# Patient Record
Sex: Male | Born: 1950
Health system: Southern US, Community
[De-identification: ages and names within clinical notes are randomized; demographics above are authoritative.]

## PROBLEM LIST (undated history)

## (undated) DIAGNOSIS — K635 Polyp of colon: Secondary | ICD-10-CM

## (undated) DIAGNOSIS — K2270 Barrett's esophagus without dysplasia: Secondary | ICD-10-CM

## (undated) DIAGNOSIS — C7A098 Malignant carcinoid tumors of other sites: Secondary | ICD-10-CM

## (undated) DIAGNOSIS — J3089 Other allergic rhinitis: Secondary | ICD-10-CM

## (undated) DIAGNOSIS — D3A021 Benign carcinoid tumor of the cecum: Secondary | ICD-10-CM

## (undated) DIAGNOSIS — R011 Cardiac murmur, unspecified: Secondary | ICD-10-CM

## (undated) DIAGNOSIS — Z978 Presence of other specified devices: Secondary | ICD-10-CM

## (undated) DIAGNOSIS — T7840XA Allergy, unspecified, initial encounter: Secondary | ICD-10-CM

## (undated) DIAGNOSIS — I10 Essential (primary) hypertension: Secondary | ICD-10-CM

## (undated) DIAGNOSIS — K219 Gastro-esophageal reflux disease without esophagitis: Secondary | ICD-10-CM

## (undated) DIAGNOSIS — K589 Irritable bowel syndrome without diarrhea: Secondary | ICD-10-CM

## (undated) DIAGNOSIS — N400 Enlarged prostate without lower urinary tract symptoms: Secondary | ICD-10-CM

## (undated) HISTORY — DX: Other allergic rhinitis: J30.89

## (undated) HISTORY — PX: UPPER GASTROINTESTINAL ENDOSCOPY: SHX188

## (undated) HISTORY — DX: Polyp of colon: K63.5

## (undated) HISTORY — DX: Irritable bowel syndrome, unspecified: K58.9

## (undated) HISTORY — DX: Benign carcinoid tumor of the cecum: D3A.021

## (undated) HISTORY — DX: Malignant carcinoid tumors of other sites: C7A.098

## (undated) HISTORY — DX: Allergy, unspecified, initial encounter: T78.40XA

## (undated) HISTORY — DX: Barrett's esophagus without dysplasia: K22.70

## (undated) HISTORY — DX: Essential (primary) hypertension: I10

## (undated) HISTORY — PX: COLON SURGERY: SHX602

## (undated) HISTORY — DX: Cardiac murmur, unspecified: R01.1

## (undated) HISTORY — DX: Gastro-esophageal reflux disease without esophagitis: K21.9

---

## 2004-11-04 ENCOUNTER — Ambulatory Visit: Payer: Self-pay | Admitting: Gastroenterology

## 2004-11-11 ENCOUNTER — Ambulatory Visit: Payer: Self-pay | Admitting: Gastroenterology

## 2004-11-26 ENCOUNTER — Ambulatory Visit: Payer: Self-pay | Admitting: Gastroenterology

## 2004-11-26 ENCOUNTER — Ambulatory Visit (HOSPITAL_COMMUNITY): Admission: RE | Admit: 2004-11-26 | Discharge: 2004-11-26 | Payer: Self-pay | Admitting: Internal Medicine

## 2005-01-29 ENCOUNTER — Ambulatory Visit: Payer: Self-pay | Admitting: Gastroenterology

## 2005-02-09 ENCOUNTER — Ambulatory Visit: Payer: Self-pay | Admitting: Gastroenterology

## 2007-12-12 ENCOUNTER — Ambulatory Visit: Payer: Self-pay | Admitting: Gastroenterology

## 2007-12-30 ENCOUNTER — Ambulatory Visit: Payer: Self-pay | Admitting: Gastroenterology

## 2007-12-30 ENCOUNTER — Encounter: Payer: Self-pay | Admitting: Gastroenterology

## 2008-07-29 ENCOUNTER — Emergency Department (HOSPITAL_COMMUNITY): Admission: EM | Admit: 2008-07-29 | Discharge: 2008-07-29 | Payer: Self-pay | Admitting: Emergency Medicine

## 2008-08-28 ENCOUNTER — Ambulatory Visit (HOSPITAL_COMMUNITY): Admission: RE | Admit: 2008-08-28 | Discharge: 2008-08-28 | Payer: Self-pay | Admitting: Family Medicine

## 2008-12-03 ENCOUNTER — Ambulatory Visit (HOSPITAL_COMMUNITY): Admission: RE | Admit: 2008-12-03 | Discharge: 2008-12-03 | Payer: Self-pay | Admitting: Urology

## 2008-12-21 DIAGNOSIS — T8859XA Other complications of anesthesia, initial encounter: Secondary | ICD-10-CM

## 2008-12-21 HISTORY — DX: Other complications of anesthesia, initial encounter: T88.59XA

## 2010-12-31 ENCOUNTER — Encounter: Payer: Self-pay | Admitting: Gastroenterology

## 2011-01-22 NOTE — Letter (Signed)
Summary: Colonoscopy Date Change Letter  [facility]  [address] [place], [place] [address]   Phone: [phone]  Fax: [phone]      December 31, 2010 MRN: [id number]   Tommy Conley [address] [place], [place]  [address]   Dear [name],   Previously you were recommended to have a repeat colonoscopy around this time. Your chart was recently reviewed by Dr. Claudette Head of [facility]. Follow up colonoscopy is now recommended in June 2012. This revised recommendation is based on current, nationally recognized guidelines for colorectal cancer screening and polyp surveillance. These guidelines are endorsed by the American Cancer Society, [employer] on Colorectal Cancer as well as numerous other major medical organizations.  Please understand that our recommendation assumes that you do not have any new symptoms such as bleeding, a change in bowel habits, anemia, or significant abdominal discomfort. If you do have any concerning GI symptoms or want to discuss the guideline recommendations, please call to arrange an office visit at your earliest convenience. Otherwise we will keep you in our reminder system and contact you 1-2 months prior to the date listed above to schedule your next colonoscopy.  Thank you,  Judie Petit T. Russella Dar, M.D.  [facility] [phone]

## 2011-05-05 NOTE — Assessment & Plan Note (Signed)
[facility]                         GASTROENTEROLOGY OFFICE NOTE   Tommy Conley, Tommy Conley                   MRN:          [id number]  DATE:12/12/2007                            DOB:          09-Aug-1951    This is a return office visit for GERD.  He relates intermittent  problems with nighttime reflux and regurgitation despite maintaining  Nexium on a daily basis with the p.r.n. use of Zantac.  In addition, he  has a history of a short segment of Barrett's esophagus from endoscopy  in February 2006.   CURRENT MEDICATIONS:  Listed on the chart, have been reviewed.   MEDICATION ALLERGIES:  None known.   In no acute distress.  Blood pressure 132/80, pulse 100 and regular.  CHEST:  Clear to auscultation bilaterally.  CARDIAC:  Regular rate and rhythm without murmurs.  ABDOMEN:  Soft and nontender with normoactive bowel sounds.   ASSESSMENT AND PLAN:  Gastroesophageal reflux disease with a history of  focal short-segment Barrett's esophagus.  He is to reintensify all  standard antireflux measures including the use of 4-inch bed blocks and  no oral intake for 3-4 hours before bedtime.  We may need to increase  his proton pump inhibitor to b.i.d.  Risks, benefits, and alternatives  to upper endoscopy with possible biopsy discussed with the patient and  he consents to proceed.  This will be scheduled electively.     Venita Lick. Russella Dar, MD, [facility]  Electronically Signed    MTS/MedQ  DD: 12/22/2007  DT: 12/22/2007  Job #: [id number]   cc:   Donna Bernard, M.D.

## 2011-05-27 ENCOUNTER — Encounter: Payer: Self-pay | Admitting: Gastroenterology

## 2011-06-19 ENCOUNTER — Encounter: Payer: Self-pay | Admitting: Gastroenterology

## 2011-07-22 HISTORY — PX: COLONOSCOPY: SHX174

## 2011-08-07 ENCOUNTER — Ambulatory Visit (AMBULATORY_SURGERY_CENTER): Payer: 59 | Admitting: *Deleted

## 2011-08-07 ENCOUNTER — Encounter: Payer: Self-pay | Admitting: Gastroenterology

## 2011-08-07 VITALS — Ht 73.0 in | Wt 225.6 lb

## 2011-08-07 DIAGNOSIS — K2270 Barrett's esophagus without dysplasia: Secondary | ICD-10-CM

## 2011-08-07 DIAGNOSIS — Z1211 Encounter for screening for malignant neoplasm of colon: Secondary | ICD-10-CM

## 2011-08-07 MED ORDER — PEG-KCL-NACL-NASULF-NA ASC-C 100 G PO SOLR: ORAL | Status: DC

## 2011-08-21 ENCOUNTER — Encounter: Payer: Self-pay | Admitting: Gastroenterology

## 2011-08-21 ENCOUNTER — Other Ambulatory Visit: Payer: Self-pay | Admitting: Gastroenterology

## 2011-08-21 ENCOUNTER — Encounter: Payer: 59 | Admitting: Gastroenterology

## 2011-08-21 ENCOUNTER — Telehealth: Payer: Self-pay

## 2011-08-21 ENCOUNTER — Ambulatory Visit (AMBULATORY_SURGERY_CENTER): Payer: 59 | Admitting: Gastroenterology

## 2011-08-21 DIAGNOSIS — K6389 Other specified diseases of intestine: Secondary | ICD-10-CM

## 2011-08-21 DIAGNOSIS — K2270 Barrett's esophagus without dysplasia: Secondary | ICD-10-CM

## 2011-08-21 DIAGNOSIS — R933 Abnormal findings on diagnostic imaging of other parts of digestive tract: Secondary | ICD-10-CM

## 2011-08-21 DIAGNOSIS — D131 Benign neoplasm of stomach: Secondary | ICD-10-CM

## 2011-08-21 DIAGNOSIS — K210 Gastro-esophageal reflux disease with esophagitis: Secondary | ICD-10-CM

## 2011-08-21 DIAGNOSIS — D126 Benign neoplasm of colon, unspecified: Secondary | ICD-10-CM

## 2011-08-21 DIAGNOSIS — Z1211 Encounter for screening for malignant neoplasm of colon: Secondary | ICD-10-CM

## 2011-08-21 DIAGNOSIS — D3A00 Benign carcinoid tumor of unspecified site: Secondary | ICD-10-CM

## 2011-08-21 MED ORDER — SODIUM CHLORIDE 0.9 % IV SOLN: 500.0000 mL | INTRAVENOUS | Status: DC

## 2011-08-21 NOTE — Patient Instructions (Signed)
Please refer to blue and green discharge instruction sheets. 

## 2011-08-21 NOTE — Telephone Encounter (Signed)
Pt scheduled for CT of abd/pelvis 08/25/11 @ [facility]. Pt to arrive at 1:45pm, test @2pm . NPO after 10am. Pt to drink one bottle of contrast at 12noon and 1 bottle at 1pm. Pt aware of appt date and time. Contrast was sent home with the pt by endo.

## 2011-08-21 NOTE — Progress Notes (Signed)
Moved to recliner room as MD wants to talk to pt a second time.

## 2011-08-25 ENCOUNTER — Telehealth: Payer: Self-pay | Admitting: *Deleted

## 2011-08-25 ENCOUNTER — Telehealth: Payer: Self-pay

## 2011-08-25 ENCOUNTER — Ambulatory Visit (INDEPENDENT_AMBULATORY_CARE_PROVIDER_SITE_OTHER)
Admission: RE | Admit: 2011-08-25 | Discharge: 2011-08-25 | Disposition: A | Discharge status: Home / Self Care | Payer: 59 | Source: Ambulatory Visit | Attending: Gastroenterology | Admitting: Gastroenterology

## 2011-08-25 ENCOUNTER — Other Ambulatory Visit: Payer: Self-pay

## 2011-08-25 DIAGNOSIS — K6389 Other specified diseases of intestine: Secondary | ICD-10-CM

## 2011-08-25 MED ORDER — IOHEXOL 300 MG/ML  SOLN
100.0000 mL | Freq: Once | INTRAMUSCULAR | Status: AC | PRN
  Administered 2011-08-25: 100 mL via INTRAVENOUS

## 2011-08-25 NOTE — Telephone Encounter (Signed)

## 2011-08-25 NOTE — Telephone Encounter (Signed)
Pt scheduled to see Dr. Derrell Lolling 08/27/11@2pm . Pt aware of appt date and time.

## 2011-08-26 ENCOUNTER — Other Ambulatory Visit (INDEPENDENT_AMBULATORY_CARE_PROVIDER_SITE_OTHER): Payer: 59

## 2011-08-26 DIAGNOSIS — R933 Abnormal findings on diagnostic imaging of other parts of digestive tract: Secondary | ICD-10-CM

## 2011-08-26 DIAGNOSIS — K6389 Other specified diseases of intestine: Secondary | ICD-10-CM

## 2011-08-26 LAB — CBC WITH DIFFERENTIAL/PLATELET
Basophils Absolute: 0 10*3/uL (ref 0.0–0.1)
Basophils Relative: 0.3 % (ref 0.0–3.0)
Eosinophils Absolute: 0.1 10*3/uL (ref 0.0–0.7)
Hemoglobin: 12 g/dL — ABNORMAL LOW (ref 13.0–17.0)
Lymphocytes Relative: 13.8 % (ref 12.0–46.0)
MCHC: 32.1 g/dL (ref 30.0–36.0)
MCV: 75.8 fl — ABNORMAL LOW (ref 78.0–100.0)
Monocytes Absolute: 0.5 10*3/uL (ref 0.1–1.0)
Neutro Abs: 6.2 10*3/uL (ref 1.4–7.7)
Neutrophils Relative %: 78.4 % — ABNORMAL HIGH (ref 43.0–77.0)
Platelets: 247 10*3/uL (ref 150.0–400.0)
RBC: 4.93 Mil/uL (ref 4.22–5.81)
RDW: 18.2 % — ABNORMAL HIGH (ref 11.5–14.6)
WBC: 7.9 10*3/uL (ref 4.5–10.5)

## 2011-08-26 LAB — COMPREHENSIVE METABOLIC PANEL
ALT: 12 U/L (ref 0–53)
AST: 17 U/L (ref 0–37)
Albumin: 4 g/dL (ref 3.5–5.2)
Alkaline Phosphatase: 62 U/L (ref 39–117)
CO2: 27 mEq/L (ref 19–32)
Calcium: 9.1 mg/dL (ref 8.4–10.5)
Chloride: 106 mEq/L (ref 96–112)
Creatinine, Ser: 0.9 mg/dL (ref 0.4–1.5)
GFR: 109.18 mL/min (ref >60.00)
Glucose, Bld: 94 mg/dL (ref 70–99)
Potassium: 3.5 mEq/L (ref 3.5–5.1)
Sodium: 141 mEq/L (ref 135–145)
Total Bilirubin: 0.6 mg/dL (ref 0.3–1.2)
Total Protein: 7.1 g/dL (ref 6.0–8.3)

## 2011-08-27 ENCOUNTER — Ambulatory Visit (INDEPENDENT_AMBULATORY_CARE_PROVIDER_SITE_OTHER): Payer: Self-pay | Admitting: General Surgery

## 2011-08-27 LAB — CEA: CEA: 2.2 ng/mL (ref 0.0–5.0)

## 2011-08-31 ENCOUNTER — Encounter: Payer: Self-pay | Admitting: Gastroenterology

## 2011-08-31 ENCOUNTER — Telehealth: Payer: Self-pay | Admitting: Gastroenterology

## 2011-08-31 NOTE — Telephone Encounter (Signed)
I called patient to review his pathology report, CT and blood work. I answered his questions. He notes looser stools for about 6 months but denies other symptoms. He will see Dr. Derrell Lolling on Thursday. I discussed the information to date with Dr. Derrell Lolling earlier today.

## 2011-09-03 ENCOUNTER — Ambulatory Visit (INDEPENDENT_AMBULATORY_CARE_PROVIDER_SITE_OTHER): Payer: 59 | Admitting: General Surgery

## 2011-09-03 ENCOUNTER — Encounter (INDEPENDENT_AMBULATORY_CARE_PROVIDER_SITE_OTHER): Payer: Self-pay | Admitting: General Surgery

## 2011-09-03 VITALS — BP 132/88 | HR 84 | Temp 98.5°F | Ht 73.0 in | Wt 217.8 lb

## 2011-09-03 DIAGNOSIS — C787 Secondary malignant neoplasm of liver and intrahepatic bile duct: Secondary | ICD-10-CM

## 2011-09-03 DIAGNOSIS — C7B02 Secondary carcinoid tumors of liver: Secondary | ICD-10-CM

## 2011-09-03 DIAGNOSIS — C7A00 Malignant carcinoid tumor of unspecified site: Secondary | ICD-10-CM

## 2011-09-03 DIAGNOSIS — C7A022 Malignant carcinoid tumor of the ascending colon: Secondary | ICD-10-CM

## 2011-09-03 NOTE — Patient Instructions (Signed)
You have a malignant carcinoid tumor of the right colon. It has possibly spread to the liver. You will be scheduled for blood work and a 24-hour urine collection. You will be scheduled for surgery to remove the tumor from the right colon and biopsy of the liver. After you have recovered from the surgery you will be referred to a medical oncologist.

## 2011-09-03 NOTE — Progress Notes (Addendum)
Chief Complaint  Patient presents with  . 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 male.   This is a 60 year old [place] American man from [place], [place]. He is referred to me by. Dr. 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 Dr. Loran Senters is his primary care physician.  The patient is healthy. He is treated for gastroesophageal reflux disease and hypertension, but otherwise has no medical problems and has never had any surgery.  For the past 6 or 8 months his stools have been looser. He will have 4-5 semi-formed loose stools a day, and the consistency will depend on what eats. He denies any abdominal pain or weight loss. He denies any flushing or diaphoresis or palpitations or tachycardia.  A screening colonoscopy was performed on August 21, 2011 and Dr. Russella Dar describes it as a 4 cm mass in the cecum which was friable and malignant looking. There were some polyps in the rectum. There was a polyp in the stomach which he found on endoscopy.  Pathology report shows a well-differentiated neuroendocrine tumor, or carcinoid of the cecum. The polyps from the descending colon and rectum showed benign tubular adenomas. Biopsy of the gastric body shows a benign fundic gland polyp. Negative for Helicobacter pylori.  A CT scan was subsequently performed and this shows a mass in the cecum with mild inflammatory changes. There are multiple bulky liver metastases. He has an enlarged prostate. A CEA is 2.2.  I called Dr. Mancel Bale at the cancer Center today we discussed strategies for his care.  HPI  Past Medical History  Diagnosis Date  . Hypertension   . Barrett's esophagus   . GERD (gastroesophageal reflux disease)   . Colon polyp     Past Surgical History  Procedure Date  . Colonoscopy 07/2011  . Upper gastrointestinal endoscopy     hx barrett's esophagus     Family History  Problem Relation Age of Onset  . Colon polyps Neg Hx   . Colon cancer Neg Hx   . Stomach cancer Neg Hx     Social History History  Substance Use Topics  . Smoking status: Current Everyday Smoker -- 0.2 packs/day  . Smokeless tobacco: Not on file  . Alcohol Use: No    No Known Allergies  Current Outpatient Prescriptions  Medication Sig Dispense Refill  . amLODipine (NORVASC) 10 MG tablet Take 10 mg by mouth daily.       . hyoscyamine (LEVBID) 0.375 MG 12 hr tablet every 12 (twelve) hours as needed.       [name] losartan (COZAAR) 50 MG tablet Take 50 mg by mouth daily.       [name] NEXIUM 40 MG capsule Take 40 mg by mouth daily before breakfast.         Review of Systems Review of Systems  Constitutional: Negative.   HENT: Negative.   Eyes: Negative.   Respiratory: Negative.   Cardiovascular: Negative.   Gastrointestinal: Positive for diarrhea. Negative for nausea, vomiting, abdominal pain, constipation, blood in stool, abdominal distention, anal bleeding and rectal pain.  Genitourinary: Negative.   Musculoskeletal: Negative.   Skin: Negative.   Neurological: Negative.   Hematological: Negative.   Psychiatric/Behavioral: Negative.     Blood pressure 132/88, pulse 84, temperature 98.5 F (36.9 C), temperature source Temporal, height 6\' 1"  (1.854 m), weight 217 lb 12.8 oz (  98.793 kg).  Physical Exam Physical Exam  Constitutional: He is oriented to person, place, and time. He appears well-developed and well-nourished. No distress.       Pleasant gentleman. His wife is with him throughout the encounter.  HENT:  Head: Atraumatic.  Nose: Nose normal.  Mouth/Throat: No oropharyngeal exudate.  Eyes: Conjunctivae and EOM are normal. Left eye exhibits no discharge. No scleral icterus.  Neck: Normal range of motion. Neck supple. No JVD present. No tracheal deviation present. No thyromegaly present.  Cardiovascular: Normal rate, normal heart sounds and intact  distal pulses.   No murmur heard. Pulmonary/Chest: Effort normal and breath sounds normal. No respiratory distress. He has no wheezes. He has no rales. He exhibits no tenderness.  Abdominal: Soft. Bowel sounds are normal. He exhibits no distension and no mass. There is no tenderness. There is no rebound and no guarding.  Musculoskeletal: Normal range of motion. He exhibits no edema and no tenderness.  Lymphadenopathy:    He has no cervical adenopathy.  Neurological: He is alert and oriented to person, place, and time. He exhibits normal muscle tone. Coordination normal.  Skin: Skin is warm and dry. No rash noted. No erythema. No pallor.  Psychiatric: He has a normal mood and affect. His behavior is normal. Judgment and thought content normal.    Data Reviewed I have reviewed his colonoscopy report. I reviewed his pathology and discussed at this Dr. Dewitt Rota in the pathology department. I have discussed his findings and care with Dr. Claudette Head I discussed strategy for his care with Dr. Mancel Bale.  Assessment    Malignant carcinoid tumor of the cecum.  Suspect multiple bilobar liver metastasis.  He has some diarrhea, but otherwise no symptoms of classic carcinoid syndrome.  Hypertension  Gastroesophageal reflux disease.    Plan    He will be scheduled for a laparoscopic-assisted right colectomy and liver biopsy.  Preoperatively we will schedule him for a chromogranin A blood level and a 24-hour urine for 5 HIAA.  I have discussed the indications and details of surgery with the patient and his wife. Risks and complications have been outlined, including but not limited to bleeding, infection, temporary ostomy, conversion to open laparotomy, injury to adjacent organs such  as the bladder or ureter,  the intestine or vascular structures with major reconstructive surgery, wound problems, cardiac pulmonary and thromboembolic problems. He seems to understand these issues well. At  this time all his questions are answered. He is in full agreement with this plan.  We will use the entereg protocol in the perioperative period to reduce the chance of ileus.  ADDENDUM (09-29-2011)---Chromagranin A level is 238 - High - (reference range 1.9-15.0)                                                Urine HIAA is 124 ; volume 1150 cc - High - (reference range less than 6.0)        Kahliyah Dick M 09/03/2011, 4:48 PM

## 2011-09-07 ENCOUNTER — Other Ambulatory Visit (INDEPENDENT_AMBULATORY_CARE_PROVIDER_SITE_OTHER): Payer: Self-pay | Admitting: General Surgery

## 2011-09-14 LAB — 5 HIAA, QUANTITATIVE, URINE, 24 HOUR: 5-HIAA, 24 Hr Urine: 124 mg/24 h — ABNORMAL HIGH (ref <=6.0)

## 2011-09-23 ENCOUNTER — Encounter (HOSPITAL_COMMUNITY)
Admission: RE | Admit: 2011-09-23 | Discharge: 2011-09-23 | Disposition: A | Discharge status: Home / Self Care | Payer: 59 | Source: Ambulatory Visit | Attending: General Surgery | Admitting: General Surgery

## 2011-09-23 ENCOUNTER — Other Ambulatory Visit (INDEPENDENT_AMBULATORY_CARE_PROVIDER_SITE_OTHER): Payer: Self-pay | Admitting: General Surgery

## 2011-09-23 DIAGNOSIS — K6389 Other specified diseases of intestine: Secondary | ICD-10-CM

## 2011-09-23 LAB — CBC
HCT: 36.5 % — ABNORMAL LOW (ref 39.0–52.0)
MCHC: 32.9 g/dL (ref 30.0–36.0)
Platelets: 228 10*3/uL (ref 150–400)
RDW: 16.7 % — ABNORMAL HIGH (ref 11.5–15.5)
WBC: 7.9 10*3/uL (ref 4.0–10.5)

## 2011-09-23 LAB — COMPREHENSIVE METABOLIC PANEL
ALT: 13 U/L (ref 0–53)
AST: 15 U/L (ref 0–37)
Albumin: 3.8 g/dL (ref 3.5–5.2)
Alkaline Phosphatase: 71 U/L (ref 39–117)
BUN: 9 mg/dL (ref 6–23)
Chloride: 103 mEq/L (ref 96–112)
Potassium: 4.2 mEq/L (ref 3.5–5.1)
Sodium: 141 mEq/L (ref 135–145)
Total Bilirubin: 0.4 mg/dL (ref 0.3–1.2)
Total Protein: 6.7 g/dL (ref 6.0–8.3)

## 2011-09-23 LAB — URINALYSIS, ROUTINE W REFLEX MICROSCOPIC
Glucose, UA: NEGATIVE mg/dL
Hgb urine dipstick: NEGATIVE
Ketones, ur: NEGATIVE mg/dL
Leukocytes, UA: NEGATIVE
Protein, ur: NEGATIVE mg/dL
pH: 6 (ref 5.0–8.0)

## 2011-09-23 LAB — DIFFERENTIAL
Basophils Absolute: 0 10*3/uL (ref 0.0–0.1)
Basophils Relative: 0 % (ref 0–1)
Eosinophils Absolute: 0.2 10*3/uL (ref 0.0–0.7)
Eosinophils Relative: 2 % (ref 0–5)
Lymphocytes Relative: 18 % (ref 12–46)
Monocytes Absolute: 0.8 10*3/uL (ref 0.1–1.0)

## 2011-09-23 LAB — ABO/RH: ABO/RH(D): B POS

## 2011-09-23 LAB — TYPE AND SCREEN: Antibody Screen: NEGATIVE

## 2011-09-23 LAB — SURGICAL PCR SCREEN: Staphylococcus aureus: NEGATIVE

## 2011-09-24 ENCOUNTER — Encounter: Payer: 59 | Admitting: Oncology

## 2011-09-25 ENCOUNTER — Telehealth (INDEPENDENT_AMBULATORY_CARE_PROVIDER_SITE_OTHER): Payer: Self-pay

## 2011-09-25 ENCOUNTER — Encounter (INDEPENDENT_AMBULATORY_CARE_PROVIDER_SITE_OTHER): Payer: Self-pay

## 2011-09-25 NOTE — Telephone Encounter (Signed)
Pt's employer contacted our office to request a letter stating the date of his surgery and the projected time out of work.  I faxed a letter to Caleb Popp, RN, at [phone].

## 2011-09-29 ENCOUNTER — Other Ambulatory Visit (INDEPENDENT_AMBULATORY_CARE_PROVIDER_SITE_OTHER): Payer: Self-pay | Admitting: General Surgery

## 2011-09-29 ENCOUNTER — Inpatient Hospital Stay (HOSPITAL_COMMUNITY)
Admission: RE | Admit: 2011-09-29 | Discharge: 2011-10-04 | DRG: 330 | Disposition: A | Discharge status: Home / Self Care | Payer: 59 | Source: Ambulatory Visit | Attending: General Surgery | Admitting: General Surgery

## 2011-09-29 DIAGNOSIS — C787 Secondary malignant neoplasm of liver and intrahepatic bile duct: Secondary | ICD-10-CM | POA: Diagnosis present

## 2011-09-29 DIAGNOSIS — Z5331 Laparoscopic surgical procedure converted to open procedure: Secondary | ICD-10-CM

## 2011-09-29 DIAGNOSIS — C7A021 Malignant carcinoid tumor of the cecum: Principal | ICD-10-CM | POA: Diagnosis present

## 2011-09-29 DIAGNOSIS — Z01818 Encounter for other preprocedural examination: Secondary | ICD-10-CM

## 2011-09-29 DIAGNOSIS — C189 Malignant neoplasm of colon, unspecified: Secondary | ICD-10-CM

## 2011-09-29 DIAGNOSIS — I10 Essential (primary) hypertension: Secondary | ICD-10-CM | POA: Diagnosis present

## 2011-09-29 DIAGNOSIS — Z79899 Other long term (current) drug therapy: Secondary | ICD-10-CM

## 2011-09-29 DIAGNOSIS — Z01810 Encounter for preprocedural cardiovascular examination: Secondary | ICD-10-CM

## 2011-09-29 DIAGNOSIS — Z01812 Encounter for preprocedural laboratory examination: Secondary | ICD-10-CM

## 2011-09-29 DIAGNOSIS — R000 Tachycardia, unspecified: Secondary | ICD-10-CM | POA: Diagnosis not present

## 2011-09-29 DIAGNOSIS — K219 Gastro-esophageal reflux disease without esophagitis: Secondary | ICD-10-CM | POA: Diagnosis present

## 2011-09-29 DIAGNOSIS — F17200 Nicotine dependence, unspecified, uncomplicated: Secondary | ICD-10-CM | POA: Diagnosis present

## 2011-09-29 DIAGNOSIS — C229 Malignant neoplasm of liver, not specified as primary or secondary: Secondary | ICD-10-CM

## 2011-09-29 HISTORY — PX: WEDGE LIVER BIOPSY: SUR137

## 2011-09-29 HISTORY — PX: OTHER SURGICAL HISTORY: SHX169

## 2011-09-30 LAB — CBC
HCT: 34.4 % — ABNORMAL LOW (ref 39.0–52.0)
Hemoglobin: 11.5 g/dL — ABNORMAL LOW (ref 13.0–17.0)
MCHC: 33.4 g/dL (ref 30.0–36.0)
RBC: 4.7 MIL/uL (ref 4.22–5.81)
WBC: 14.5 10*3/uL — ABNORMAL HIGH (ref 4.0–10.5)

## 2011-09-30 LAB — BASIC METABOLIC PANEL
BUN: 7 mg/dL (ref 6–23)
CO2: 27 mEq/L (ref 19–32)
Chloride: 98 mEq/L (ref 96–112)
GFR calc non Af Amer: >90 mL/min (ref >90)
Glucose, Bld: 153 mg/dL — ABNORMAL HIGH (ref 70–99)
Potassium: 3.6 mEq/L (ref 3.5–5.1)
Sodium: 134 mEq/L — ABNORMAL LOW (ref 135–145)

## 2011-09-30 NOTE — Op Note (Signed)
Tommy Conley, Tommy Conley            ACCOUNT NO.:  [id number]  MEDICAL RECORD NO.:  [id number]  LOCATION:                                 FACILITY:  PHYSICIAN:  Angelia Mould. Derrell Lolling, M.D.DATE OF BIRTH:  02-22-1951  DATE OF PROCEDURE:  09/29/2011 DATE OF DISCHARGE:                              OPERATIVE REPORT   PREOPERATIVE DIAGNOSIS:  Carcinoid tumor of the cecum with extensive liver metastasis.  POSTOPERATIVE DIAGNOSIS: Carcinoid tumor of the cecum with extensive liver metastasis.  OPERATION PERFORMED:  Laparoscopy; open right colectomy; wedge biopsy liver mass, right lobe of liver.  SURGEON:  Angelia Mould. Derrell Lolling, MD  FIRST ASSISTANT:  Lodema Pilot, MD  OPERATIVE INDICATIONS:  This is a 60 year old gentleman who has hypertension, tobacco use and gastroesophageal reflux disease.  For the past 6 or 8 months, he has been having loose stools.  He denies abdominal pain or weight loss.  He denies flushing or diaphoresis or palpitations.Colonoscopy was performed by Dr. Claudette Head.  He described a 4-cm mass in the cecum, which was friable and malignant looking and some polyps in the rectum.  Pathology report of the cecal mass showed a well- differentiated neuroendocrine tumor, suggestive of carcinoid tumor. Polyps of the descending colon and rectum showed benign adenomas.  A CT scan showed a mass in the cecum and extensive bulky bilobar liver metastasis.  CEA level was 2.2.  Chromogranin A level and urine HIAA levels are significantly elevated.  He has seen me and Dr. Truett Perna preop.  He is brought to the operating room for resection of his primary tumor and liver biopsy.  OPERATIVE FINDINGS:  The patient had extensive bilobar liver metastasis. We chose to take a biopsy from the anterior surface of the right lobe of the liver as a wedge biopsy.  The gallbladder looked normal.  The stomach and duodenum looked normal.  The tumor itself was hard and densely adherent to the  retroperitoneum near the junction of the second and third portions of the duodenum.  It did not invade the duodenum, but we did need to leave some tumor behind in that area because it was immediately adherent adjacent to it.  It had not invaded the ureter.  We did not feel any other masses in the small bowel.  We did not feel any masses in omentum.  There was no ascites.  OPERATIVE TECHNIQUE:  Following the induction of general endotracheal anesthesia, the patient's abdomen was prepped and draped following insertion of a Foley catheter.  Intravenous antibiotics were given. Surgical time-out was held identifying the correct patient and correct procedure.  A vertically-oriented incision was made at the superior rim of the umbilicus.  The fascia was incised in the midline and the abdominal cavity entered under direct vision.  An 11-mm Hasson trocar was inserted and secured with pursestring suture of 0-Vicryl.  Pneumoperitoneum was created.  We put a 5-mm trocar in the lower midline, 5-mm trocar in the left lower quadrant, and a 5-mm trocar a few centimeters above the umbilical port.  We surveyed the liver and abdomen and pelvis with findings as described above.  We began to dissect the terminal ileum, which was densely adherent against the right  lateral sidewall of the pelvis.  As we got to the tumor in the cecum looking from the medial aspect it was densely adherent to the retroperitoneum and rock-hard.  We abandoned the laparoscopic approach at this point.  The pneumoperitoneum was released. The trocars were removed.  A midline incision was made, dividing the fascia with electrocautery.  Self-retaining retractors were placed.  The abdomen was explored.  We mobilized the distal ileum up out of the pelvis and there were a couple of benign adhesions down there.  We ultimately transected the distal ileum about 10 or 12 inches proximal to the ileocecal valve.  We mobilized the terminal  ileum and cecum by dividing their lateral peritoneal attachments very slowly and very carefully.  We were concerned about injury to the ureter and the duodenum as potential complications and so we took the dissection very carefully and very slowly.  We divided the small bowel mesentery using a LigaSure device. We divided the gastrocolic and omentum with the LigaSure device.  We mobilized the transverse colon down under the liver.  We identified the antrum and the duodenum and slowly and carefully dissected the hepatic flexure up off the duodenum.  There was one point where the tumor was adherent to the retroperitoneum.  It did not appear to be invading the duodenum, but was very close to the junction of the second and third portions of the duodenum.  Because of his stage IV disease, I chose simply to cut across the tumor leaving a little bit of tumor behind in this location and by so doing avoided injury to the duodenum.  We transected the transverse colon just to the right of the midline using a GIA stapling device.  We then could further mobilize the specimen and divide the mesenteric vessels between clamps.  We ligated the larger mesenteric vessels, the ileocolic and the right colic with 2-0 silk ties.  Smaller vessels were divided with the LigaSure device.  We then sent the specimen to the lab for routine histology.  We had good hemostasis at this point.  We asked the anesthetist to give intravenous indigo carmine.  That was done and the indigo carmine dye did show up in the urine.  There was no leakage of indigo carmine dye.  The right retroperitoneum was inspected looking for the ureters and when we could feel the ureter crossing the iliac vessels.  The retroperitoneum was somewhat thickened and so we did not dissect it out extensively, but we felt like we had identified it.  We then examined the liver and decided to perform a biopsy on the anterior surface of the right lobe of the  liver to the right of the gallbladder.  Using electrocautery, I did a wedge biopsy of a large mass taking about 4-6 g of tissue.  This was sent for routine histology. Hemostasis was excellent and achieved with electrocautery.  This required a fair amount of cautery and I also put some SNOW  sponge on this and the bleeding completely stopped.  Anastomosis was created between the distal ileum and the mid transverse colon using a GIA stapling device.  There was no bleeding from the staple lines.  The defect in the bowel wall was closed with a TA 60 stapling device. We placed a few extra sutures at critical points on the staple line. At this point, we changed our instruments and gloves and suction devices.  We closed the mesentery with interrupted sutures of 2-0 silk.  We then irrigated  the abdomen and pelvis with several liters of saline.  We checked the retroperitoneum and again saw no bleeding and no leakage anywhere.  We placed a 19-French Blake drain back into the right retroperitoneum up near the duodenum and the undersurface of the liver.  This was brought out through a separate stab incision in the right upper quadrant and sutured to the skin with a nylon suture and connected to a suction bulb.  We returned the small bowel and the anastomosis of the omentum to their anatomic positions. The midline fascia was closed with a running suture of #1 PDS and the skin closed with skin staples.  Clean bandages were placed.  The patient was taken to the recovery room in a stable condition.  ESTIMATED BLOOD LOSS:  About 150 mL.  COMPLICATIONS:  None.  Sponge, needle, and instrument counts were correct.     Angelia Mould. Derrell Lolling, M.D.     HMI/MEDQ  D:  09/29/2011  T:  09/29/2011  Job:  [id number]  cc:   Venita Lick. Russella Dar, MD, Doree Fudge, M.D. Ladene Artist, M.D.  Electronically Signed by Claud Kelp M.D. on 09/30/2011 06:10:11 AM

## 2011-10-01 DIAGNOSIS — R000 Tachycardia, unspecified: Secondary | ICD-10-CM

## 2011-10-01 DIAGNOSIS — I10 Essential (primary) hypertension: Secondary | ICD-10-CM

## 2011-10-02 LAB — CBC
HCT: 35.6 % — ABNORMAL LOW (ref 39.0–52.0)
Hemoglobin: 11.6 g/dL — ABNORMAL LOW (ref 13.0–17.0)
MCH: 24.1 pg — ABNORMAL LOW (ref 26.0–34.0)
MCHC: 32.6 g/dL (ref 30.0–36.0)
RBC: 4.82 MIL/uL (ref 4.22–5.81)

## 2011-10-02 LAB — BASIC METABOLIC PANEL
BUN: 8 mg/dL (ref 6–23)
CO2: 28 mEq/L (ref 19–32)
Calcium: 9.6 mg/dL (ref 8.4–10.5)
Glucose, Bld: 103 mg/dL — ABNORMAL HIGH (ref 70–99)
Sodium: 138 mEq/L (ref 135–145)

## 2011-10-05 ENCOUNTER — Telehealth (INDEPENDENT_AMBULATORY_CARE_PROVIDER_SITE_OTHER): Payer: Self-pay | Admitting: General Surgery

## 2011-10-05 NOTE — Telephone Encounter (Addendum)
Message copied by Latricia Heft on Mon Oct 05, 2011  3:27 PM ------ Scheduled the patient to come for a staple removal 10/13/11      Message from: Ernestene Mention      Created: Sun Oct 04, 2011 11:33 PM       [name] was discharged Sunday. He needs to return to see me in one week to get his staples out.

## 2011-10-13 ENCOUNTER — Encounter (INDEPENDENT_AMBULATORY_CARE_PROVIDER_SITE_OTHER): Payer: Self-pay | Admitting: General Surgery

## 2011-10-13 ENCOUNTER — Ambulatory Visit (INDEPENDENT_AMBULATORY_CARE_PROVIDER_SITE_OTHER): Payer: 59 | Admitting: General Surgery

## 2011-10-13 VITALS — BP 122/82 | HR 66 | Temp 97.3°F | Resp 16 | Ht 73.0 in | Wt 205.2 lb

## 2011-10-13 DIAGNOSIS — C7A021 Malignant carcinoid tumor of the cecum: Secondary | ICD-10-CM

## 2011-10-13 DIAGNOSIS — Z9889 Other specified postprocedural states: Secondary | ICD-10-CM

## 2011-10-13 NOTE — Patient Instructions (Signed)
You arr recovering from your abdominal surgery without any obvious complication. Your staples were removed today and your wound looks fine. I encourage you to take a 30 minute walk everyday. You may resume all normal physical activities without restriction and return to work on or about November 20. Be sure to keep her appointment with Dr. Truett Perna  on November 2. Return to see me in 7-8 weeks.

## 2011-10-13 NOTE — Progress Notes (Signed)
Subjective:     Patient ID: Tommy Conley, male   DOB: [birth year], 60 y.o.   MRN: [id number]  HPI This patient underwent right colectomy and liver biopsy on September 29, 2011. He had a malignant carcinoid tumor of the ileocecal area with multiple bulky liver metastasis to both lobes. He is recovering without major complication. He has seen Dr. Mancel Bale  preop and in the hospital and has an appointment to see him in about 2 weeks.  Since discharge his appetite is good. He's had a one or 2 very soft or loose bowel movement daily, similar to what he had preop. He denies fever or chills. He has no pain and is not requiring any pain medication. He is ambulatory.  His HIAA and Chromagranin A are elevated, but he does not have any Palpitations, diaphoresis, flushing. Review of Systems     Objective:   Physical Exam The patient looks well. His wife is with him. Abdomen is soft flat and nontender. The wound is healing nicely. Staples are removed.    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  Recovering uneventfully following right colectomy and liver biopsy.  Elevated blood and urine chemistries. He may be just starting to develop carcinoid syndrome.    Plan:     Diet and activities discussed.  Okay to resume all normal activities on or after November 20.  Imodium p.r.n. for diarrhea  Patient to see Dr. Truett Perna on November 2.  Return to see me in 7-8 weeks.

## 2011-10-14 NOTE — Discharge Summary (Signed)
[name], Tommy Conley            ACCOUNT NO.:  [id number]  MEDICAL RECORD NO.:  [id number]  LOCATION:  5155                         FACILITY:  [facility]  PHYSICIAN:  Angelia Mould. Derrell Lolling, M.D.DATE OF BIRTH:  [birth year]  DATE OF ADMISSION:  09/29/2011 DATE OF DISCHARGE:  10/04/2011                              DISCHARGE SUMMARY   FINAL DIAGNOSES: 1. 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 2. Gastroesophageal reflux disease. 3. Hypertension.  OPERATIONS PERFORMED:  Open right colectomy, wedge biopsy of right lobe liver mass, date of surgery September 29, 2011.  HISTORY:  This is a 60 year old gentleman with hypertension, reported tobacco use, and gastroesophageal reflux disease.  For the past 6 months, he has been having more loose stools, but he denies abdominal pain or weight loss.  He denies flushing or diaphoresis or chest pain orpalpitations.  Colonoscopy was performed by Dr. Claudette Head.  He described a 4-cm mass in the cecum which was friable and malignant looking.  Pathology report of the cecal mass biopsy showed a well- differentiated neuroendocrine tumor suggestive of carcinoid tumor.  CT scan showed the mass in the cecum and extensive and bulky bilobar liver metastasis.  CEA level was 2.2.  Chromogranin A level and urine HIAA are significantly elevated.  He was evaluated by me and by Dr. Mancel Bale.  He has undergone a bowel prep and was brought to the hospital electively for surgery.  HOSPITAL COURSE:  On the day of admission, the patient was taken to the operating room.  The abdomen was explored.  He was found to have extensive bilobar liver metastasis.  His gallbladder looked normal.  The stomach and duodenum looked normal.  The tumor in the cecum was very hard and densely adherent to the retroperitoneum near the duodenum.  We felt like we probably cut across tumor to avoid  injury to the duodenum. The tumor did not invade the ureter.  The right colectomy was performed and primary anastomosis was created.  Final pathology report showed malignant carcinoid tumor, both in the cecum and in the liver.  The lymph nodes were negative, making this a pathologic stage T4 N0 M1 tumor.  The proximal and distal margins were negative, but there was a positive radial margin.  Postoperatively, the patient did fairly well.  He was placed on Entereg protocol.  His Foley catheter was removed on postop day 2, and he was started on clear liquid diet, and his narcotic was diminished.  He had little bit of tachycardia and we started him on beta blockers, and he did fairly well with that.  We felt that the tachycardia was new and probably related to the post op  pain from his surgery and not a carcinoid syndrome.  We discussed his pathology report with him.  Dr. Mancel Bale actually saw him in the hospital and arranged for outpatient followup.  He had a Jackson-Pratt drain, which never drained much of anything other than some odorless serosanguineous fluid.  We advanced his activities and diet over the next couple of days and he was ready to go home on October 04, 2011.  At that time, he was feeling good and wanted to go home, had had a bowel movement, was not having much pain.  His abdomen was soft and the wound looked good. The JP drain was removed.  Staples were left in place.  Discharge medications include: 1. Vicodin for pain. 2. Losartan 50 mg daily. 3. Amlodipine 10 mg daily. 4. Hyoscyamine 0.375 mg by mouth twice daily. 5. Nexium 40 mg daily.  He was asked to follow up with me in the office in 1 week for staple removal.  Appointment has been made to see Dr. Mancel Bale as an outpatient.     Angelia Mould. Derrell Lolling, M.D.     HMI/MEDQ  D:  10/13/2011  T:  10/13/2011  Job:  [id number]  cc:   Dr. Stephania Fragmin T. Russella Dar, MD, Clementeen Graham Ladene Artist,  M.D.  Electronically Signed by Claud Kelp M.D. on 10/14/2011 10:02:27 AM

## 2011-10-16 ENCOUNTER — Encounter (INDEPENDENT_AMBULATORY_CARE_PROVIDER_SITE_OTHER): Payer: Self-pay

## 2011-10-23 ENCOUNTER — Encounter (HOSPITAL_BASED_OUTPATIENT_CLINIC_OR_DEPARTMENT_OTHER): Payer: 59 | Admitting: Oncology

## 2011-10-23 DIAGNOSIS — C179 Malignant neoplasm of small intestine, unspecified: Secondary | ICD-10-CM

## 2011-10-23 DIAGNOSIS — C228 Malignant neoplasm of liver, primary, unspecified as to type: Secondary | ICD-10-CM

## 2011-10-26 ENCOUNTER — Telehealth: Payer: Self-pay | Admitting: Oncology

## 2011-10-26 NOTE — Telephone Encounter (Signed)
No avialability for 12/6. Per BS he will see pt 12/4 @ 10:30 am. lmonvm for pt re appt for 11/28 + 12/4. Nov/dec schedule mailed today.

## 2011-10-26 NOTE — Progress Notes (Signed)
CC:   Donna Bernard, M.D. Angelia Mould. Derrell Lolling, M.D. Venita Lick. Russella Dar, MD, [facility]  Tommy Conley returns as scheduled.  He was taken to the operating room on 10/9 by Dr. Derrell Lolling and underwent a right colectomy and wedge biopsy of a liver mass.  He was found to have extensive bilobar liver metastases at the time of surgery.  The tumor was firm and adherent to the retroperitoneum near the junction of the second and third portions of the duodenum.  Tumor did not invade the duodenum.  The tumor was adherent to the duodenum and gross tumor was left behind.  There were no other masses in the small bowel or omentum.  There was no ascites.  The pathology [id number]) revealed a well-differentiated neuroendocrine carcinoma involving the terminal ileum measuring 3 cm. Lymphovascular and perineural invasion were identified.  Three discontinuous tumor nodules were identified.  Nine lymph nodes were negative for metastatic disease.  The resection margin was positive. The liver biopsy confirmed invasive well-differentiated neuroendocrine carcinoma.  He reports an uneventful recovery from surgery.  He feels well at present.  He does not have diarrhea at present.  PHYSICAL EXAMINATION:  Vital Signs:  Pressure 137/83 pulse 101, temperature 97.1, weight 207.4 (219.8 on 10/4).  Lungs:  Clear. Cardiac:  Regular rhythm.  Faint (1/6) systolic murmur at the left sternal border.  Abdomen:  Healed midline incision.  No hepatomegaly. Nontender.  No apparent ascites.  Vascular:  No leg edema.  IMPRESSIONS AND PLAN: 1. Metastatic carcinoid tumor with a primary well-differentiated     neuroendocrine carcinoma involving the terminal ileum, 3 cm.     a.     Status post a right colon/ileum resection on 09/29/2011.     b.     Wedge biopsy of a right liver lesion on 09/29/2011 confirmed      metastatic well-differentiated neuroendocrine carcinoma.     c.     CT of the abdomen 08/25/2011 consistent with multiple  liver      metastases.     d.     Elevated preoperative chromogranin A level and a 24-hour      urine 5-HIAA level. 2. History of diarrhea (3 to 4 times per day), predating surgery for 8     months.  He denies diarrhea at present. 3. Hypertension. 4. History of gastroesophageal reflux disease. 5. Rectal and ascending colon polyps. 6. Microcytic anemia. 7. Disposition:  Mr. Faivre has recovered from the recent bowel     resection.  He feels well at present.  He plans to return to work     within the next few weeks. He has been diagnosed with metastatic carcinoid tumor involving the liver.  I discussed the diagnosis, prognosis, and treatment options with Mr. Krach and his wife.  He understands no therapy will be curative.  He appears asymptomatic at present.  We discussed an observation approach, systemic chemotherapy, and hepatic-directed therapy.  I offered him a second opinion at one of the local medical centers and he does not wish to pursue this at present.  He understands there is no clear "survival" benefit for treatment in the absence of symptoms.  We will follow his clinical status closely and initiate hepatic-directed or systemic therapy if there is evidence for disease progression.  He will return for an office visit and repeat tumor markers in 1 month.  We will plan a restaging CT evaluation within the next 2 to 3 months.  I plan to present his case  at the GI Tumor Conference within the next few weeks.    ______________________________ Ladene Artist, M.D. GBS/MEDQ  D:  10/23/2011  T:  10/26/2011  Job:  [id number]

## 2011-11-16 ENCOUNTER — Other Ambulatory Visit: Payer: Self-pay | Admitting: Oncology

## 2011-11-18 ENCOUNTER — Other Ambulatory Visit (HOSPITAL_BASED_OUTPATIENT_CLINIC_OR_DEPARTMENT_OTHER): Payer: 59 | Admitting: Lab

## 2011-11-18 ENCOUNTER — Other Ambulatory Visit: Payer: Self-pay | Admitting: Oncology

## 2011-11-18 DIAGNOSIS — C7A012 Malignant carcinoid tumor of the ileum: Secondary | ICD-10-CM

## 2011-11-23 LAB — COMPREHENSIVE METABOLIC PANEL
AST: 17 U/L (ref 0–37)
Albumin: 4.3 g/dL (ref 3.5–5.2)
Alkaline Phosphatase: 63 U/L (ref 39–117)
Calcium: 10 mg/dL (ref 8.4–10.5)
Chloride: 105 mEq/L (ref 96–112)
Potassium: 3.5 mEq/L (ref 3.5–5.3)
Sodium: 141 mEq/L (ref 135–145)
Total Protein: 6.7 g/dL (ref 6.0–8.3)

## 2011-11-23 LAB — 5 HIAA, QUANTITATIVE, URINE, 24 HOUR: 5-HIAA, 24 Hr Urine: 147.8 mg/24 h — ABNORMAL HIGH (ref <=6.0)

## 2011-11-24 ENCOUNTER — Ambulatory Visit (HOSPITAL_BASED_OUTPATIENT_CLINIC_OR_DEPARTMENT_OTHER): Payer: 59 | Admitting: Oncology

## 2011-11-24 ENCOUNTER — Telehealth: Payer: Self-pay | Admitting: Oncology

## 2011-11-24 VITALS — BP 121/73 | HR 93 | Temp 97.1°F | Ht 72.5 in | Wt 212.2 lb

## 2011-11-24 DIAGNOSIS — D649 Anemia, unspecified: Secondary | ICD-10-CM

## 2011-11-24 DIAGNOSIS — K620 Anal polyp: Secondary | ICD-10-CM

## 2011-11-24 DIAGNOSIS — D3A098 Benign carcinoid tumors of other sites: Secondary | ICD-10-CM

## 2011-11-24 DIAGNOSIS — D3A00 Benign carcinoid tumor of unspecified site: Secondary | ICD-10-CM

## 2011-11-24 DIAGNOSIS — D3A012 Benign carcinoid tumor of the ileum: Secondary | ICD-10-CM

## 2011-11-24 DIAGNOSIS — I10 Essential (primary) hypertension: Secondary | ICD-10-CM

## 2011-11-24 NOTE — Progress Notes (Signed)
OFFICE PROGRESS NOTE   INTERVAL HISTORY:   Tommy Conley returns as scheduled. He reports feeling well. He is working. He noted mild discomfort at the low abdomen when he wore pants with a belt. He denies fever, flushing, and diarrhea. He has one to 3 bowel movements per day with variable consistency. The stool frequency diminished following surgery.  Objective:  Vital signs in last 24 hours:  Blood pressure 121/73, pulse 93, temperature 97.1 F (36.2 C), temperature source Oral, height 6' 0.5" (1.842 m), weight 212 lb 3.2 oz (96.253 kg).    HEENT: Neck without mass Resp: Lungs clear bilaterally Cardio: Regular rate and rhythm GI: Abdomen is Nontender. No hepatomegaly. No mass Vascular: No leg edema     Lab Results:  CBC  Lab Results  Component Value Date   WBC 12.1* 10/02/2011   HGB 11.6* 10/02/2011   HCT 35.6* 10/02/2011   MCV 73.9* 10/02/2011   PLT 264 10/02/2011    Chemistry:      Component Value Date/Time   NA 141 11/18/2011 1326   NA 141 11/18/2011 1326   K 3.5 11/18/2011 1326   K 3.5 11/18/2011 1326   CL 105 11/18/2011 1326   CL 105 11/18/2011 1326   CO2 29 11/18/2011 1326   CO2 29 11/18/2011 1326   GLUCOSE 83 11/18/2011 1326   GLUCOSE 83 11/18/2011 1326   BUN 7 11/18/2011 1326   BUN 7 11/18/2011 1326   CREATININE 0.97 11/18/2011 1326   CREATININE 0.97 11/18/2011 1326   CALCIUM 10.0 11/18/2011 1326   CALCIUM 10.0 11/18/2011 1326   PROT 6.7 11/18/2011 1326   PROT 6.7 11/18/2011 1326   ALBUMIN 4.3 11/18/2011 1326   ALBUMIN 4.3 11/18/2011 1326   AST 17 11/18/2011 1326   AST 17 11/18/2011 1326   ALT 15 11/18/2011 1326   ALT 15 11/18/2011 1326   ALKPHOS 63 11/18/2011 1326   ALKPHOS 63 11/18/2011 1326   BILITOT 0.4 11/18/2011 1326   BILITOT 0.4 11/18/2011 1326   GFRNONAA >90 10/02/2011 0645   GFRAA >90 10/02/2011 0645   Chromogranin A 190 on 11/18/2011 (238 on 09/07/2011) 24-hour urine 5 HIAA 147.8 on 11/16/2011 (124 on 09/07/2011)  Medications:  I have reviewed the patient's current medications.  Assessment/Plan: 1. 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   a. Status post a right colon/ileum resection on 09/29/2011.   b. Wedge biopsy of a right liver lesion on 09/29/2011 confirmed metastatic well-differentiated neuroendocrine carcinoma.   c. CT of the abdomen 08/25/2011 consistent with multiple liver metastases.  d. Elevated preoperative chromogranin A level and a 24-hour urine 5-HIAA level. Stable on repeat 11/16/2011. 2. History of diarrhea (3 to 4 times per day), predating surgery for 8 months.  He denies diarrhea at present. 3. Hypertension. 4. History of gastroesophageal reflux disease. 5. Rectal and ascending colon polyps. 6. History of a Microcytic anemia. We will obtain a CBC at the next office visit.   Disposition:  Tommy Conley appears well. He has no symptoms of carcinoid syndrome. He was noted to have extensive metastatic disease involving the liver on the initial CT scan and at the time of surgery. We decided to proceed with a restaging CT scan in early January of 2013. He will return for opposite visit after the CT scan. We will consider initiating systemic therapy or radioactive embolization if there has been significant radiographic progression. He'll return for an office visit on January 9. He will  contact us in the interim for new symptoms.   Lucile Shutters, MD  11/24/2011  1:23 PM

## 2011-11-24 NOTE — Telephone Encounter (Signed)
gve the pt his jan 2013 appt calendar along with the ct scan appt °

## 2011-11-26 ENCOUNTER — Ambulatory Visit (INDEPENDENT_AMBULATORY_CARE_PROVIDER_SITE_OTHER): Payer: 59 | Admitting: General Surgery

## 2011-11-26 ENCOUNTER — Encounter (INDEPENDENT_AMBULATORY_CARE_PROVIDER_SITE_OTHER): Payer: Self-pay | Admitting: General Surgery

## 2011-11-26 VITALS — BP 140/82 | HR 72 | Temp 97.1°F | Resp 14 | Ht 73.0 in | Wt 211.4 lb

## 2011-11-26 DIAGNOSIS — Z9889 Other specified postprocedural states: Secondary | ICD-10-CM

## 2011-11-26 NOTE — Progress Notes (Signed)
Subjective:     Patient ID: Tommy Conley, male   DOB: [birth year], 60 y.o.   MRN: [id number]  HPI This patient underwent a right colectomy and liver biopsy on September 29, 2011. He has malignant carcinoid tumor of the ileocecal region of the intestine with extensive liver metastasis.  Preop, his chromogranin A and urine HIAA were elevated.  Postop he is feeling good and is ready to return to full activities. His appetite is normal. He rarely has diarrhea. He has no flushing.  He saw Dr. Truett Perna recently and he does not plan any intervention yet.    He's going to do a restaging CT in January 2013 and decide at  that time whether to give adjuvant chemotherapy or radioactive embolization.  Review of Systems     Objective:   Physical Exam Patient looks well. He is in good spirits.  Abdomen: Soft flat nontender.   Midline incision well-healed. No hernia. No inguinal adenopathy.    Assessment:     Malignant carcinoid tumor of the ileocecal area with extensive liver metastasis.  Uneventful recovery following right colectomy and liver biopsy.  No real symptoms of carcinoid syndrome.    Plan:     Continue close followup as Dr. Truett Perna regarding medical oncology management.  Okay to return to work and all normal physical activities without restriction.  Return to see me in 9 months. Sooner if there are any problems.

## 2011-11-26 NOTE — Patient Instructions (Signed)
All of your wounds have healed well. There is no sign of any complication of your surgery. You may resume all normal physical activities without restriction. Keep your regular appointments with Dr. Truett Perna. Return to see me in 9 months. I'll be happy to see you at any time if there are any new problems.

## 2011-12-28 ENCOUNTER — Other Ambulatory Visit: Payer: Self-pay | Admitting: Oncology

## 2011-12-28 ENCOUNTER — Encounter (HOSPITAL_COMMUNITY): Payer: Self-pay

## 2011-12-28 ENCOUNTER — Ambulatory Visit (HOSPITAL_COMMUNITY)
Admission: RE | Admit: 2011-12-28 | Discharge: 2011-12-28 | Disposition: A | Discharge status: Home / Self Care | Payer: 59 | Source: Ambulatory Visit | Attending: Oncology | Admitting: Oncology

## 2011-12-28 DIAGNOSIS — Z85038 Personal history of other malignant neoplasm of large intestine: Secondary | ICD-10-CM | POA: Insufficient documentation

## 2011-12-28 DIAGNOSIS — Z9049 Acquired absence of other specified parts of digestive tract: Secondary | ICD-10-CM | POA: Insufficient documentation

## 2011-12-28 DIAGNOSIS — D3A00 Benign carcinoid tumor of unspecified site: Secondary | ICD-10-CM

## 2011-12-28 DIAGNOSIS — C787 Secondary malignant neoplasm of liver and intrahepatic bile duct: Secondary | ICD-10-CM | POA: Insufficient documentation

## 2011-12-28 MED ORDER — IOHEXOL 300 MG/ML  SOLN
100.0000 mL | Freq: Once | INTRAMUSCULAR | Status: AC | PRN
  Administered 2011-12-28: 100 mL via INTRAVENOUS

## 2011-12-30 ENCOUNTER — Other Ambulatory Visit (HOSPITAL_BASED_OUTPATIENT_CLINIC_OR_DEPARTMENT_OTHER): Payer: 59 | Admitting: Lab

## 2011-12-30 ENCOUNTER — Ambulatory Visit (HOSPITAL_BASED_OUTPATIENT_CLINIC_OR_DEPARTMENT_OTHER): Payer: 59 | Admitting: Oncology

## 2011-12-30 ENCOUNTER — Telehealth: Payer: Self-pay | Admitting: Oncology

## 2011-12-30 VITALS — BP 137/80 | HR 90 | Temp 97.7°F | Ht 73.0 in | Wt 212.8 lb

## 2011-12-30 DIAGNOSIS — D649 Anemia, unspecified: Secondary | ICD-10-CM

## 2011-12-30 DIAGNOSIS — D509 Iron deficiency anemia, unspecified: Secondary | ICD-10-CM

## 2011-12-30 DIAGNOSIS — C7A012 Malignant carcinoid tumor of the ileum: Secondary | ICD-10-CM

## 2011-12-30 DIAGNOSIS — C7A021 Malignant carcinoid tumor of the cecum: Secondary | ICD-10-CM

## 2011-12-30 DIAGNOSIS — C787 Secondary malignant neoplasm of liver and intrahepatic bile duct: Secondary | ICD-10-CM

## 2011-12-30 LAB — CBC WITH DIFFERENTIAL/PLATELET
BASO%: 0.6 % (ref 0.0–2.0)
EOS%: 2.4 % (ref 0.0–7.0)
LYMPH%: 25.2 % (ref 14.0–49.0)
MCH: 23.3 pg — ABNORMAL LOW (ref 27.2–33.4)
MCHC: 31.8 g/dL — ABNORMAL LOW (ref 32.0–36.0)
MCV: 73.3 fL — ABNORMAL LOW (ref 79.3–98.0)
MONO%: 11.1 % (ref 0.0–14.0)
Platelets: 228 10*3/uL (ref 140–400)
RBC: 4.64 10*6/uL (ref 4.20–5.82)
nRBC: 0 % (ref 0–0)

## 2011-12-30 NOTE — Telephone Encounter (Signed)
appt mader for chemo teaching on 1/15 and lab and lisa on 2/8,printed for pt   aom

## 2011-12-30 NOTE — Progress Notes (Signed)
OFFICE PROGRESS NOTE   INTERVAL HISTORY:   He returns as scheduled. He has approximately 1 bowel movement per day. He denies diarrhea. He has noted curving and cracking of the fingernails.  Objective:  Vital signs in last 24 hours:  Blood pressure 137/80, pulse 90, temperature 97.7 F (36.5 C), temperature source Oral, height 6\' 1"  (1.854 m), weight 212 lb 12.8 oz (96.525 kg).   Resp: Lungs clear bilaterally Cardio: Regular rate and rhythm GI: Nontender, no hepatomegaly Vascular: No leg edema     Lab Results:  Lab Results  Component Value Date   WBC 6.4 12/30/2011   HGB 10.8* 12/30/2011   HCT 34.0* 12/30/2011   MCV 73.3* 12/30/2011   PLT 228 12/30/2011   ferritin-7  X-rays: A CT of the abdomen and pelvis on 12/28/2011 was compared to a CT from 08/25/2011. Slight progression of metastatic disease in the liver. Similar lesions are slightly larger and others are slightly smaller. Suspicious lymph node at the root of the sigmoid mesocolon.   Medications: I have reviewed the patient's current medications.  Assessment/Plan: 1. 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  a. Status post a right colon/ileum resection on 09/29/2011.  b. Wedge biopsy of a right liver lesion on 09/29/2011 confirmed metastatic well-differentiated neuroendocrine carcinoma.  c. CT of the abdomen 08/25/2011 consistent with multiple liver metastases.  d. Elevated preoperative chromogranin A level and a 24-hour urine 5-HIAA level. Stable on repeat 11/16/2011. e. Restaging CT on 12/28/2011 confirmed slight progression of the metastatic liver lesions. 2. History of diarrhea (3 to 4 times per day), predating surgery for 8 months. He denies diarrhea at present. 3. Hypertension. 4. History of gastroesophageal reflux disease. 5. Rectal and ascending colon polyps. 6. Iron deficiency anemia-he will begin ferrous sulfate   Disposition:  He  appears well. He has metastatic carcinoid tumor with numerous liver metastases. We discussed a continued observation approach, a trial of systemic therapy, and hepatic directed therapy. He would like to begin treatment. We decided to begin a trial of Xeloda/temozolomide.  I reviewed the potential toxicities associated with these chemotherapeutic agents. He understands the potential for nausea/vomiting, mucositis, diarrhea, and hematologic toxicity. We discussed the skin rash, hyperpigmentation, and hand-foot syndrome associated with Xeloda. He understands no therapy will be curative. We will obtain a baseline chromogranin A level on the lab from today. She will attend chemotherapy teaching class.  The plan is to initiate a first cycle of systemic therapy in approximately 1 week. He will return problems visit in 3-4 weeks.  He'll begin ferrous sulfate for treatment of the iron deficiency anemia.   Lucile Shutters, MD  12/30/2011  6:54 PM

## 2012-01-01 ENCOUNTER — Other Ambulatory Visit: Payer: Self-pay | Admitting: *Deleted

## 2012-01-01 MED ORDER — CAPECITABINE 500 MG PO TABS: 1500.0000 mg | ORAL_TABLET | Freq: Two times a day (BID) | ORAL | Status: DC

## 2012-01-01 MED ORDER — TEMOZOLOMIDE 100 MG PO CAPS: 300.0000 mg | ORAL_CAPSULE | Freq: Every day | ORAL | Status: DC

## 2012-01-01 MED ORDER — ONDANSETRON HCL 8 MG PO TABS: 8.0000 mg | ORAL_TABLET | Freq: Every day | ORAL | Status: DC

## 2012-01-01 NOTE — Telephone Encounter (Signed)
Temodar, Xeloda and Zofran prescriptions faxed to [facility] outpatient pharmacy by [facility] in managed care. Left [facility] for [facility] to [facility] rx.

## 2012-01-01 NOTE — Telephone Encounter (Signed)
Per April no Precert required for Xeloda; Temadar; or Zofran.  She said it he could go to any pharmacy.

## 2012-01-05 ENCOUNTER — Other Ambulatory Visit: Payer: Self-pay | Admitting: *Deleted

## 2012-01-05 ENCOUNTER — Encounter: Payer: Self-pay | Admitting: Medical Oncology

## 2012-01-05 ENCOUNTER — Encounter: Payer: Self-pay | Admitting: *Deleted

## 2012-01-05 ENCOUNTER — Telehealth: Payer: Self-pay | Admitting: Oncology

## 2012-01-05 ENCOUNTER — Other Ambulatory Visit: Payer: 59

## 2012-01-05 DIAGNOSIS — C7A021 Malignant carcinoid tumor of the cecum: Secondary | ICD-10-CM

## 2012-01-05 MED ORDER — CAPECITABINE 500 MG PO TABS: 1500.0000 mg | ORAL_TABLET | Freq: Two times a day (BID) | ORAL | Status: AC

## 2012-01-05 MED ORDER — ONDANSETRON HCL 8 MG PO TABS: 8.0000 mg | ORAL_TABLET | Freq: Every day | ORAL | Status: AC

## 2012-01-05 MED ORDER — TEMOZOLOMIDE 100 MG PO CAPS: 300.0000 mg | ORAL_CAPSULE | Freq: Every day | ORAL | Status: AC

## 2012-01-05 NOTE — Telephone Encounter (Signed)
Sent prescriptions to [facility].

## 2012-01-05 NOTE — Progress Notes (Signed)
Received confirmation from [facility] they receive prescription  For xeloda.

## 2012-01-06 ENCOUNTER — Telehealth: Payer: Self-pay | Admitting: Oncology

## 2012-01-06 NOTE — Telephone Encounter (Signed)
RXs sent to [facility] per [facility] @ [facility].  She Transferred them to Fax [phone]; Phone number is [phone] Temodar and Xeloda are each 36.00 and Zofran is 4.00

## 2012-01-07 ENCOUNTER — Other Ambulatory Visit: Payer: Self-pay | Admitting: *Deleted

## 2012-01-07 NOTE — Telephone Encounter (Signed)
Patient left message that he has not received his Temodar and Xeloda yet.

## 2012-01-07 NOTE — Telephone Encounter (Signed)
Notified patient that his scripts were forwarded from [facility] to [facility] and he should hear from them within 48 hours. Told him to call office on Monday if he has not heard from pharmacy.

## 2012-01-13 ENCOUNTER — Telehealth: Payer: Self-pay | Admitting: *Deleted

## 2012-01-13 NOTE — Telephone Encounter (Signed)
Left message on voicemail for pt to call office. Need to know if he has heard from [facility] re: Temodar, Xeloda rx.

## 2012-01-15 ENCOUNTER — Other Ambulatory Visit: Payer: Self-pay | Admitting: *Deleted

## 2012-01-15 ENCOUNTER — Telehealth: Payer: Self-pay | Admitting: Oncology

## 2012-01-15 MED ORDER — PROCHLORPERAZINE MALEATE 10 MG PO TABS: 10.0000 mg | ORAL_TABLET | Freq: Four times a day (QID) | ORAL | Status: DC | PRN

## 2012-01-15 MED ORDER — ONDANSETRON HCL 8 MG PO TABS: ORAL_TABLET | ORAL | Status: DC

## 2012-01-15 MED ORDER — TEMOZOLOMIDE 100 MG PO CAPS: 300.0000 mg | ORAL_CAPSULE | Freq: Every day | ORAL | Status: AC

## 2012-01-15 NOTE — Telephone Encounter (Addendum)
Call from pt to clarify chemo instructions. Same done. Pt wrote and repeated instructions. Voiced understanding. Pt did not receive antiemetic with chemo pills. Antiemetic rx sent electronically.

## 2012-01-15 NOTE — Telephone Encounter (Signed)
Spoke with pt, he has received chemotherapy. Plans to begin AM of 01/16/12. Instructions reinforced. Pt states he understands to take Xeloda #3 tabs twice daily for 14 days. Begin Temodar on Day 10 and take for 5 days. Instructed pt to call office with questions. Will reschedule midlevel visit to 02/03/12 due to delay in beginning med.

## 2012-01-15 NOTE — Telephone Encounter (Signed)
called pt and informed him that his appt on 2-08 was moved to 2-13

## 2012-01-20 ENCOUNTER — Encounter: Payer: Self-pay | Admitting: Oncology

## 2012-01-20 NOTE — Progress Notes (Signed)
Put 2 sets of fmla papers on nurse's desk.

## 2012-01-21 ENCOUNTER — Encounter: Payer: Self-pay | Admitting: Oncology

## 2012-01-21 NOTE — Progress Notes (Signed)
Faxed patient's fmla papers to [place] @ [phone] and his wife's fmla papers to [facility] @ [phone]; put originals in the registration desk.

## 2012-01-26 ENCOUNTER — Telehealth: Payer: Self-pay | Admitting: *Deleted

## 2012-01-26 NOTE — Telephone Encounter (Signed)
Left VM asking if he should still take Temodar X 5 days ? Started it today (1 day late).Marland KitchenMarland Kitchen

## 2012-01-26 NOTE — Telephone Encounter (Signed)
Instructed patient to take Temodar for entire length of ordered course as ordered even if starting 1 day late.

## 2012-01-29 ENCOUNTER — Other Ambulatory Visit: Payer: 59

## 2012-01-29 ENCOUNTER — Ambulatory Visit: Payer: 59 | Admitting: Nurse Practitioner

## 2012-02-03 ENCOUNTER — Ambulatory Visit (HOSPITAL_BASED_OUTPATIENT_CLINIC_OR_DEPARTMENT_OTHER): Payer: 59 | Admitting: Nurse Practitioner

## 2012-02-03 ENCOUNTER — Telehealth: Payer: Self-pay | Admitting: Oncology

## 2012-02-03 ENCOUNTER — Other Ambulatory Visit (HOSPITAL_BASED_OUTPATIENT_CLINIC_OR_DEPARTMENT_OTHER): Payer: 59

## 2012-02-03 VITALS — BP 134/83 | HR 81 | Temp 97.0°F | Ht 73.0 in | Wt 212.4 lb

## 2012-02-03 DIAGNOSIS — D649 Anemia, unspecified: Secondary | ICD-10-CM

## 2012-02-03 DIAGNOSIS — C7A021 Malignant carcinoid tumor of the cecum: Secondary | ICD-10-CM

## 2012-02-03 LAB — CBC WITH DIFFERENTIAL/PLATELET
Basophils Absolute: 0 10*3/uL (ref 0.0–0.1)
EOS%: 5.3 % (ref 0.0–7.0)
Eosinophils Absolute: 0.3 10*3/uL (ref 0.0–0.5)
HCT: 37.8 % — ABNORMAL LOW (ref 38.4–49.9)
HGB: 12.5 g/dL — ABNORMAL LOW (ref 13.0–17.1)
MCH: 26.1 pg — ABNORMAL LOW (ref 27.2–33.4)
MCV: 78.8 fL — ABNORMAL LOW (ref 79.3–98.0)
NEUT#: 3.3 10*3/uL (ref 1.5–6.5)
NEUT%: 66.2 % (ref 39.0–75.0)
RDW: 24.2 % — ABNORMAL HIGH (ref 11.0–14.6)
lymph#: 0.8 10*3/uL — ABNORMAL LOW (ref 0.9–3.3)

## 2012-02-03 LAB — COMPREHENSIVE METABOLIC PANEL
AST: 26 U/L (ref 0–37)
Albumin: 4.4 g/dL (ref 3.5–5.2)
BUN: 8 mg/dL (ref 6–23)
CO2: 25 mEq/L (ref 19–32)
Calcium: 9.5 mg/dL (ref 8.4–10.5)
Chloride: 105 mEq/L (ref 96–112)
Creatinine, Ser: 0.9 mg/dL (ref 0.50–1.35)
Glucose, Bld: 79 mg/dL (ref 70–99)
Potassium: 3.5 mEq/L (ref 3.5–5.3)

## 2012-02-03 NOTE — Progress Notes (Signed)
OFFICE PROGRESS NOTE  Interval history:  Tommy Conley is a 61 year old man with metastatic carcinoid tumor involving the liver. He began cycle 1 Xeloda/temozolomide 01/16/2012. He is seen today for scheduled followup.  Tommy Conley denies nausea/vomiting. No mouth sores. He is having occasional loose stools. No skin rash. No hand or foot pain or redness. He had one episode of "tingling" in the fingertips.   Objective: Blood pressure 134/83, pulse 81, temperature 97 F (36.1 C), temperature source Oral, height 6\' 1"  (1.854 m), weight 212 lb 6.4 oz (96.344 kg).  Oropharynx is without thrush or ulceration. Mucous membranes are pink and moist. Lungs are clear. No wheezes or rales. Regular cardiac rhythm. Abdomen is soft and nontender. No hepatomegaly. Extremities are without edema.  Lab Results: Lab Results  Component Value Date   WBC 5.0 02/03/2012   HGB 12.5* 02/03/2012   HCT 37.8* 02/03/2012   MCV 78.8* 02/03/2012   PLT 233 02/03/2012    Chemistry:    Chemistry      Component Value Date/Time   NA 141 11/18/2011 1326   NA 141 11/18/2011 1326   K 3.5 11/18/2011 1326   K 3.5 11/18/2011 1326   CL 105 11/18/2011 1326   CL 105 11/18/2011 1326   CO2 29 11/18/2011 1326   CO2 29 11/18/2011 1326   BUN 7 11/18/2011 1326   BUN 7 11/18/2011 1326   CREATININE 0.97 11/18/2011 1326   CREATININE 0.97 11/18/2011 1326      Component Value Date/Time   CALCIUM 10.0 11/18/2011 1326   CALCIUM 10.0 11/18/2011 1326   ALKPHOS 63 11/18/2011 1326   ALKPHOS 63 11/18/2011 1326   AST 17 11/18/2011 1326   AST 17 11/18/2011 1326   ALT 15 11/18/2011 1326   ALT 15 11/18/2011 1326   BILITOT 0.4 11/18/2011 1326   BILITOT 0.4 11/18/2011 1326       Studies/Results: No results found.  Medications: I have reviewed the patient's current medications.  Assessment/Plan:  1. 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  a. Status  post a right colon/ileum resection on 09/29/2011.  b. Wedge biopsy of a right liver lesion on 09/29/2011 confirmed metastatic well-differentiated neuroendocrine carcinoma.  c. CT of the abdomen 08/25/2011 consistent with multiple liver metastases.  d. Elevated preoperative chromogranin A level and a 24-hour urine 5-HIAA level. Stable on repeat 11/16/2011. e. Restaging CT on 12/28/2011 confirmed slight progression of the metastatic liver lesions. 2. History of diarrhea (3 to 4 times per day), predating surgery for 8 months. He has occasional loose stools. 3. Hypertension. 4. History of gastroesophageal reflux disease. 5. Rectal and ascending colon polyps. 6. Iron deficiency anemia-hemoglobin is better.  Disposition-Tommy Conley appears stable. He completed cycle #1 Xeloda/temozolomide beginning 01/16/2012. He tolerated the first cycle well. He will return for a followup CBC on 02/11/2012 with plans to begin cycle #2 02/13/2012. He will return for a followup visit with Dr. Truett Perna on 03/10/2012. He will contact the office in the interim with any problems.  Plan reviewed with Dr. Truett Perna.  Lonna Cobb ANP/GNP-BC

## 2012-02-03 NOTE — Telephone Encounter (Signed)
appt made and printed for 3/21   aom

## 2012-02-04 ENCOUNTER — Other Ambulatory Visit: Payer: Self-pay | Admitting: *Deleted

## 2012-02-04 DIAGNOSIS — C7A021 Malignant carcinoid tumor of the cecum: Secondary | ICD-10-CM

## 2012-02-04 MED ORDER — CAPECITABINE 500 MG PO TABS: 1500.0000 mg | ORAL_TABLET | Freq: Two times a day (BID) | ORAL | Status: DC

## 2012-02-04 MED ORDER — TEMOZOLOMIDE 100 MG PO CAPS: 300.0000 mg | ORAL_CAPSULE | Freq: Every day | ORAL | Status: DC

## 2012-02-19 ENCOUNTER — Other Ambulatory Visit: Payer: Self-pay | Admitting: *Deleted

## 2012-02-19 NOTE — Telephone Encounter (Signed)
[facility] that [facility] on his Zofran. They will prepare now. Called patient back and made him aware he has refills on his Zofran and to call pharmacy in future for these refills.

## 2012-02-19 NOTE — Telephone Encounter (Signed)
Voice mail requesting refill on his Zofran

## 2012-02-25 ENCOUNTER — Telehealth: Payer: Self-pay | Admitting: *Deleted

## 2012-02-25 NOTE — Telephone Encounter (Signed)
Pt left vm asking if he could take OTC meds for a cold.  Returned call @ 5:48pm & gave OK for OTC meds to treat cold symptomatically.  Encouraged to call if fever > 100.5 & yellow secretions.  He was also instructed to check with pharmacist if he has questions about what to take.  He is on Xeloda & temodar.

## 2012-03-03 ENCOUNTER — Telehealth: Payer: Self-pay | Admitting: *Deleted

## 2012-03-03 NOTE — Telephone Encounter (Signed)
Message from pt asking if OK to have chemo pills refilled. Left message on voicemail: OK to refill. DO NOT START until after MD visit. Requested pt return call.

## 2012-03-10 ENCOUNTER — Telehealth: Payer: Self-pay | Admitting: Oncology

## 2012-03-10 ENCOUNTER — Ambulatory Visit (HOSPITAL_BASED_OUTPATIENT_CLINIC_OR_DEPARTMENT_OTHER): Payer: 59 | Admitting: Oncology

## 2012-03-10 ENCOUNTER — Other Ambulatory Visit: Payer: Self-pay | Admitting: *Deleted

## 2012-03-10 ENCOUNTER — Other Ambulatory Visit (HOSPITAL_BASED_OUTPATIENT_CLINIC_OR_DEPARTMENT_OTHER): Payer: 59

## 2012-03-10 VITALS — BP 119/71 | HR 87 | Temp 97.5°F | Ht 73.0 in | Wt 214.7 lb

## 2012-03-10 DIAGNOSIS — C7A021 Malignant carcinoid tumor of the cecum: Secondary | ICD-10-CM

## 2012-03-10 DIAGNOSIS — C7A012 Malignant carcinoid tumor of the ileum: Secondary | ICD-10-CM

## 2012-03-10 DIAGNOSIS — D509 Iron deficiency anemia, unspecified: Secondary | ICD-10-CM

## 2012-03-10 DIAGNOSIS — D649 Anemia, unspecified: Secondary | ICD-10-CM

## 2012-03-10 DIAGNOSIS — C787 Secondary malignant neoplasm of liver and intrahepatic bile duct: Secondary | ICD-10-CM

## 2012-03-10 DIAGNOSIS — K621 Rectal polyp: Secondary | ICD-10-CM

## 2012-03-10 LAB — COMPREHENSIVE METABOLIC PANEL
BUN: 8 mg/dL (ref 6–23)
CO2: 29 mEq/L (ref 19–32)
Calcium: 9.4 mg/dL (ref 8.4–10.5)
Chloride: 101 mEq/L (ref 96–112)
Creatinine, Ser: 0.85 mg/dL (ref 0.50–1.35)
Total Bilirubin: 0.3 mg/dL (ref 0.3–1.2)

## 2012-03-10 LAB — CBC WITH DIFFERENTIAL/PLATELET
Basophils Absolute: 0 10*3/uL (ref 0.0–0.1)
HCT: 39.8 % (ref 38.4–49.9)
HGB: 13 g/dL (ref 13.0–17.1)
LYMPH%: 19.2 % (ref 14.0–49.0)
MCH: 26.9 pg — ABNORMAL LOW (ref 27.2–33.4)
MCHC: 32.8 g/dL (ref 32.0–36.0)
MONO#: 0.7 10*3/uL (ref 0.1–0.9)
NEUT%: 63.9 % (ref 39.0–75.0)
Platelets: 224 10*3/uL (ref 140–400)
WBC: 5.3 10*3/uL (ref 4.0–10.3)
lymph#: 1 10*3/uL (ref 0.9–3.3)

## 2012-03-10 MED ORDER — ONDANSETRON HCL 8 MG PO TABS: ORAL_TABLET | ORAL | Status: DC

## 2012-03-10 MED ORDER — POTASSIUM CHLORIDE CRYS ER 20 MEQ PO TBCR: 20.0000 meq | EXTENDED_RELEASE_TABLET | Freq: Every day | ORAL | Status: DC

## 2012-03-10 NOTE — Progress Notes (Signed)
OFFICE PROGRESS NOTE   INTERVAL HISTORY:   He returns as scheduled. He began another cycle of chemotherapy on 02/13/2012. He reports tolerating the chemotherapy well. He has approximately 2 bowel movements per day, but no diarrhea. He has mild discomfort in the hands while on chemotherapy. None today. No nausea or mouth sores. He had an upper respiratory infection last week.  Objective:  Vital signs in last 24 hours:  Blood pressure 119/71, pulse 87, temperature 97.5 F (36.4 C), temperature source Oral, height 6\' 1"  (1.854 m), weight 214 lb 11.2 oz (97.387 kg).    HEENT: No thrush or ulcers Resp: Distant breath sounds with end inspiratory rhonchi at the extreme posterior base bilaterally, no respiratory distress Cardio: Regular rate and rhythm GI: Nontender, no hepatomegaly Vascular: No leg edema  Skin: Hyperpigmentation at the hands and feet, no erythema or skin breakdown   Lab Results:  Lab Results  Component Value Date   WBC 5.3 03/10/2012   HGB 13.0 03/10/2012   HCT 39.8 03/10/2012   MCV 82.3 03/10/2012   PLT 224 03/10/2012   ANC 3.4 Potassium 3.2    Medications: I have reviewed the patient's current medications.  Assessment/Plan: 1. 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  a. Status post a right colon/ileum resection on 09/29/2011.  b. Wedge biopsy of a right liver lesion on 09/29/2011 confirmed metastatic well-differentiated neuroendocrine carcinoma.  c. CT of the abdomen 08/25/2011 consistent with multiple liver metastases.  d. Elevated preoperative chromogranin A level and a 24-hour urine 5-HIAA level. Stable on repeat 11/16/2011. e. Restaging CT on 12/28/2011 confirmed slight progression of the metastatic liver lesions. f. Status post 2 cycles of Xeloda/temozolomide with cycle 2 initiated on 02/13/2012 2. History of diarrhea (3 to 4 times per day), predating surgery for 8 months. He  has occasional loose stools. 3. Hypertension. 4. History of gastroesophageal reflux disease. 5. Rectal and ascending colon polyps. 6. Iron deficiency anemia-improved  Disposition:  He has completed 2 cycles of Xeloda/temozolomide. He has tolerated the chemotherapy well. The plan is to begin cycle 3 on 03/12/2012. He will be scheduled for a restaging CT evaluation prior to an office visit on 04/06/2012. We will add a potassium supplement.   Lucile Shutters, MD  03/10/2012  11:55 AM

## 2012-03-10 NOTE — Telephone Encounter (Signed)
appts made and printed for pt aom °

## 2012-03-11 NOTE — Telephone Encounter (Signed)
Left message on voicemail for pt to begin potassium daily. Also instructed pt to stop Nexium 5 days prior to next lab on 04/04/12. Requested pt call office with questions.

## 2012-03-24 ENCOUNTER — Other Ambulatory Visit: Payer: Self-pay | Admitting: *Deleted

## 2012-03-24 NOTE — Telephone Encounter (Signed)
Left VM for patient to clarify how many doses he is missing and confirm what pharmacy he got chemo from.

## 2012-03-24 NOTE — Telephone Encounter (Signed)
Patient left VM that he was counting his chemo pills and will be "one package short" on his Temodar. Asking what he should do?

## 2012-03-25 ENCOUNTER — Other Ambulatory Visit: Payer: Self-pay | Admitting: *Deleted

## 2012-03-25 NOTE — Telephone Encounter (Signed)
Reports he found his last dose packet of Temodar--

## 2012-04-01 ENCOUNTER — Other Ambulatory Visit: Payer: Self-pay | Admitting: Medical Oncology

## 2012-04-01 ENCOUNTER — Other Ambulatory Visit: Payer: Self-pay | Admitting: *Deleted

## 2012-04-01 DIAGNOSIS — C7A021 Malignant carcinoid tumor of the cecum: Secondary | ICD-10-CM

## 2012-04-01 MED ORDER — CAPECITABINE 500 MG PO TABS: 1500.0000 mg | ORAL_TABLET | Freq: Two times a day (BID) | ORAL | Status: DC

## 2012-04-01 MED ORDER — TEMOZOLOMIDE 100 MG PO CAPS: 300.0000 mg | ORAL_CAPSULE | Freq: Every day | ORAL | Status: DC

## 2012-04-01 NOTE — Telephone Encounter (Signed)
THERE IS ALSO A REFILL REQUEST FOR TEMODAR. THIS REQUEST WAS ALSO GIVEN TO DR.SHERRILL'S NURSE, TANYA WHITLOCK,RN.

## 2012-04-01 NOTE — Telephone Encounter (Signed)
THIS REQUEST WAS GIVEN TO DR.SHERRILL'S NURSE, TANYA WHITLOCK,RN.

## 2012-04-04 ENCOUNTER — Ambulatory Visit (HOSPITAL_COMMUNITY)
Admission: RE | Admit: 2012-04-04 | Discharge: 2012-04-04 | Disposition: A | Discharge status: Home / Self Care | Payer: 59 | Source: Ambulatory Visit | Attending: Oncology | Admitting: Oncology

## 2012-04-04 ENCOUNTER — Other Ambulatory Visit (HOSPITAL_BASED_OUTPATIENT_CLINIC_OR_DEPARTMENT_OTHER): Payer: 59 | Admitting: Lab

## 2012-04-04 DIAGNOSIS — R599 Enlarged lymph nodes, unspecified: Secondary | ICD-10-CM | POA: Insufficient documentation

## 2012-04-04 DIAGNOSIS — C787 Secondary malignant neoplasm of liver and intrahepatic bile duct: Secondary | ICD-10-CM | POA: Insufficient documentation

## 2012-04-04 DIAGNOSIS — Z9049 Acquired absence of other specified parts of digestive tract: Secondary | ICD-10-CM | POA: Insufficient documentation

## 2012-04-04 DIAGNOSIS — C7A021 Malignant carcinoid tumor of the cecum: Secondary | ICD-10-CM | POA: Insufficient documentation

## 2012-04-04 DIAGNOSIS — R109 Unspecified abdominal pain: Secondary | ICD-10-CM | POA: Insufficient documentation

## 2012-04-04 LAB — CBC WITH DIFFERENTIAL/PLATELET
BASO%: 0.6 % (ref 0.0–2.0)
Basophils Absolute: 0 10*3/uL (ref 0.0–0.1)
HCT: 40.2 % (ref 38.4–49.9)
HGB: 13.2 g/dL (ref 13.0–17.1)
MONO#: 0.5 10*3/uL (ref 0.1–0.9)
NEUT%: 65.7 % (ref 39.0–75.0)
WBC: 5.2 10*3/uL (ref 4.0–10.3)
lymph#: 1.1 10*3/uL (ref 0.9–3.3)

## 2012-04-04 LAB — CMP (CANCER CENTER ONLY)
Albumin: 3.6 g/dL (ref 3.3–5.5)
Alkaline Phosphatase: 70 U/L (ref 26–84)
CO2: 28 mEq/L (ref 18–33)
Glucose, Bld: 90 mg/dL (ref 73–118)
Potassium: 3.5 mEq/L (ref 3.3–4.7)
Sodium: 141 mEq/L (ref 128–145)
Total Protein: 6.9 g/dL (ref 6.4–8.1)

## 2012-04-04 MED ORDER — IOHEXOL 300 MG/ML  SOLN
100.0000 mL | Freq: Once | INTRAMUSCULAR | Status: AC | PRN
  Administered 2012-04-04: 100 mL via INTRAVENOUS

## 2012-04-06 ENCOUNTER — Ambulatory Visit (HOSPITAL_BASED_OUTPATIENT_CLINIC_OR_DEPARTMENT_OTHER): Payer: 59 | Admitting: Oncology

## 2012-04-06 ENCOUNTER — Encounter: Payer: Self-pay | Admitting: Radiation Oncology

## 2012-04-06 ENCOUNTER — Telehealth: Payer: Self-pay | Admitting: Oncology

## 2012-04-06 VITALS — BP 124/78 | HR 83 | Temp 97.4°F | Ht 73.0 in | Wt 218.7 lb

## 2012-04-06 DIAGNOSIS — C7A021 Malignant carcinoid tumor of the cecum: Secondary | ICD-10-CM

## 2012-04-06 DIAGNOSIS — C7A012 Malignant carcinoid tumor of the ileum: Secondary | ICD-10-CM

## 2012-04-06 DIAGNOSIS — C787 Secondary malignant neoplasm of liver and intrahepatic bile duct: Secondary | ICD-10-CM

## 2012-04-06 NOTE — Progress Notes (Signed)
OFFICE PROGRESS NOTE   INTERVAL HISTORY:   He returns as scheduled. He completed a third cycle of chemotherapy beginning on 03/12/2012. He reports tolerating the chemotherapy well. He denies nausea, mouth sores, and increased diarrhea. He has 3-4 bowel movements per day and noted improvement when he discontinued Nexium last week. He continues to work.  Objective:  Vital signs in last 24 hours:  Blood pressure 124/78, pulse 83, temperature 97.4 F (36.3 C), temperature source Oral, height 6\' 1"  (1.854 m), weight 218 lb 11.2 oz (99.202 kg).    HEENT: No thrush or ulcers Resp: Lungs clear bilaterally Cardio: Regular rate and rhythm GI: No hepatomegaly, nontender, no mass Vascular: No leg edema  Skin: Hyperpigmentation at the palms, no skin breakdown, no erythema   Lab Results:  Lab Results  Component Value Date   WBC 5.2 04/04/2012   HGB 13.2 04/04/2012   HCT 40.2 04/04/2012   MCV 85.2 04/04/2012   PLT 208 04/04/2012   X-rays: CT abdomen with 04/04/2012 was compared to a CT from 12/28/2011-multiple hepatic metastases are slightly larger. Slight enlargement of lymph nodes at the root of the transverse mesocolon.   Medications: I have reviewed the patient's current medications.  Assessment/Plan: 1. 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  a. Status post a right colon/ileum resection on 09/29/2011.  b. Wedge biopsy of a right liver lesion on 09/29/2011 confirmed metastatic well-differentiated neuroendocrine carcinoma.  c. CT of the abdomen 08/25/2011 consistent with multiple liver metastases.  d. Elevated preoperative chromogranin A level and a 24-hour urine 5-HIAA level. Stable on repeat 11/16/2011. e. Restaging CT on 12/28/2011 confirmed slight progression of the metastatic liver lesions. f. Status post 3 cycles of Xeloda/temozolomide with cycle 3  initiated on 03/12/2012 g. Restaging CT of the  abdomen 04/04/2012 revealed slight progression of the metastatic liver lesions and transverse mesocolon adenopathy 2. History of diarrhea (3 to 4 times per day), predating surgery for 8 months. He has occasional loose stools. 3. Hypertension. 4. History of gastroesophageal reflux disease. 5. Rectal and ascending colon polyps. 6. Iron deficiency anemia-improved  Disposition:  He has completed 3 cycles of temozolomide and Xeloda. He has tolerated the chemotherapy without significant toxicity. The restaging CT evaluation reveals slight enlargement of the liver metastases. He appears asymptomatic from the metastatic carcinoid tumor.  I discussed treatment options with Mr. Cafaro. He has tolerated the current chemotherapy regimen well, but the liver lesions have not responded. We will followup on the chromogranin A level from earlier this week. I will review the restaging CT in  [facility] radiology and present his case at the GI tumor conference next week.  My initial recommendation is to refer him for radioembolization. We initiated this referral today.   [name], MD  04/06/2012  6:18 PM

## 2012-04-06 NOTE — Progress Notes (Signed)
61 year old male. Married with one daughter. Lives in [place]. Works/worked [employer].   Completed 3 cycles of xeloda/temozolomide. Restaging CT done 04/04/2012 revealed slight enlargement of nodes at the root of the transverse mesocolon. Suspicious for nodal metastasis. Also, progression of hepatic metastasis noted.   NKDA No indication of a pacemaker No hx of radiation therapy

## 2012-04-06 NOTE — Telephone Encounter (Signed)
Gave appt calendar for May 23rd , lab draw. Pt will see md on 05/18/12. Pt scheduled to see Dr. Dayton Scrape Radiation Oncology tomorrow 04/07/12 @  0915 per Clydie Braun

## 2012-04-07 ENCOUNTER — Encounter: Payer: Self-pay | Admitting: Radiation Oncology

## 2012-04-07 ENCOUNTER — Ambulatory Visit
Admission: RE | Admit: 2012-04-07 | Discharge: 2012-04-07 | Disposition: A | Discharge status: Home / Self Care | Payer: 59 | Source: Ambulatory Visit | Attending: Radiation Oncology | Admitting: Radiation Oncology

## 2012-04-07 ENCOUNTER — Other Ambulatory Visit: Payer: Self-pay | Admitting: Oncology

## 2012-04-07 VITALS — BP 135/86 | HR 65 | Temp 97.5°F | Resp 18 | Ht 73.0 in | Wt 219.1 lb

## 2012-04-07 DIAGNOSIS — C787 Secondary malignant neoplasm of liver and intrahepatic bile duct: Secondary | ICD-10-CM | POA: Insufficient documentation

## 2012-04-07 DIAGNOSIS — Z87891 Personal history of nicotine dependence: Secondary | ICD-10-CM | POA: Insufficient documentation

## 2012-04-07 DIAGNOSIS — C7A021 Malignant carcinoid tumor of the cecum: Secondary | ICD-10-CM

## 2012-04-07 DIAGNOSIS — K219 Gastro-esophageal reflux disease without esophagitis: Secondary | ICD-10-CM | POA: Insufficient documentation

## 2012-04-07 DIAGNOSIS — Z79899 Other long term (current) drug therapy: Secondary | ICD-10-CM | POA: Insufficient documentation

## 2012-04-07 DIAGNOSIS — I10 Essential (primary) hypertension: Secondary | ICD-10-CM | POA: Insufficient documentation

## 2012-04-07 NOTE — Progress Notes (Signed)
Patient presents to the clinic today unaccompanied for a new consultation with Dr. Dayton Scrape. Patient is alert and oriented to person, place, and time. No distress noted. Steady gait noted. Pleasant affect noted. Patient denies pain at this time. Patient denies nausea, vomiting, headache, dizziness or diarrhea. Patient denies bloating. Patient reports he had a loose bowel movement last night. Patient reports eating and sleeping without difficulty. Patient reports his weight dropped down to 200lb last year after surgery but, has picked by up to 219 which is normal. Patient denies blood in stool or urine. Patient has no complaints at this time. Reported all findings to Dr. Dayton Scrape.

## 2012-04-07 NOTE — Progress Notes (Signed)
Encounter addended by: Agnes Lawrence, RN on: 04/07/2012  3:24 PM<BR>     Documentation filed: Charges VN, Inpatient Patient Education

## 2012-04-07 NOTE — Progress Notes (Signed)
[facility] Radiation Oncology NEW PATIENT EVALUATION  Name: Tommy Conley MRN: [id number]  Date:   04/07/2012           DOB: 09-18-1951  Status: outpatient   CC: Harlow Asa, MD, MD  Ladene Artist, MD    REFERRING PHYSICIAN: Ladene Artist, MD   DIAGNOSIS: Carcinoid tumor metastatic to liver   HISTORY OF PRESENT ILLNESS:  Tommy Conley is a 61 y.o. male who is seen today to request of Dr. Truett Perna for evaluation of his metastatic carcinoid tumor of cecum to the liver. He presented with diarrhea and underwent colonoscopy in August of 2012 showing a 4 cm mass in the cecum. Pathology from 08/21/2011 was diagnostic for well-differentiated neuroendocrine tumor (carcinoid). He underwent an open right colectomy and wedge biopsy of a liver mass. He was found to have malignant carcinoid tumor of the cecum with extensive liver metastases and a pathologic stage of T4, N0, M1 with a positive radial margin. He was seen by Dr. Mancel Bale who elected to follow him for short interval, but began systemic therapy with interval growth of his hepatic metastases. He is been on Xeloda/temozolomide. His last CT scan of the abdomen/pelvis on 04/04/2012 showed slight progression of hepatic metastases and slight enlargement of nodes at the root of the transverse mesocolon suspicious for nodal metastases. He states that his weight is stable. He continues to work full time and he does have 3-4 loose bowel loops a day but no frank diarrhea. He denies abdominal pain.  PREVIOUS RADIATION THERAPY: No   PAST MEDICAL HISTORY:  has a past medical history of Hypertension; Barrett's esophagus; GERD (gastroesophageal reflux disease); Colon polyp; Cancer; and Carcinoid tumor of cecum.     PAST SURGICAL HISTORY:  Past Surgical History  Procedure Date  . Colonoscopy 07/2011  . Upper gastrointestinal endoscopy     hx barrett's esophagus  . Colectomy 09/29/11    lap - right with liver bx  . Ight  colon/ileum resection 09/29/2011  . Wedge liver biopsy 09/29/11    right liver lesion     FAMILY HISTORY: family history is negative for Colon polyps, and Colon cancer, and Stomach cancer, and Cancer, . His father died from cardiac disease at 52 and his mother died from natural causes/dementia at age 16.   SOCIAL HISTORY:  reports that he quit smoking about 6 weeks ago. His smoking use included Cigarettes. He has a 4.25 pack-year smoking history. He has never used smokeless tobacco. He reports that he does not drink alcohol or use illicit drugs. He works at [employer]. Separated, one daughter age 41 and one granddaughter age 14.   ALLERGIES: Review of patient's allergies indicates no known allergies.   MEDICATIONS:  Current Outpatient Prescriptions  Medication Sig Dispense Refill  . amLODipine (NORVASC) 10 MG tablet Take 10 mg by mouth daily.       . capecitabine (XELODA) 500 MG tablet Take 3 tablets (1,500 mg total) by mouth 2 (two) times daily after a meal. For 14 days.   BEGIN ON 02-13-12  84 tablet  1  . hyoscyamine (LEVBID) 0.375 MG 12 hr tablet every 12 (twelve) hours as needed.       [name] losartan (COZAAR) 50 MG tablet Take 50 mg by mouth daily.       [name] NEXIUM 40 MG capsule Take 40 mg by mouth daily before breakfast.       . potassium chloride SA (K-DUR,KLOR-CON) 20 MEQ tablet Take  1 tablet (20 mEq total) by mouth daily.  30 tablet  2  . temozolomide (TEMODAR) 100 MG capsule Take 3 capsules (300 mg total) by mouth daily. May take on an empty stomach or at bedtime to decrease nausea & vomiting. Takes 300mg  daily for 5 days on days 10-14 OF CHEMO CYCLE (XELODA).  15 capsule  1  . ondansetron (ZOFRAN) 8 MG tablet Take one tablet daily one hour before Temodar.  5 tablet  3  . prochlorperazine (COMPAZINE) 10 MG tablet Take 1 tablet (10 mg total) by mouth every 6 (six) hours as needed.  30 tablet  0  . prochlorperazine (COMPAZINE) 10 MG tablet          REVIEW OF SYSTEMS:  Pertinent  items are noted in HPI.    PHYSICAL EXAM:  height is 6\' 1"  (1.854 m) and weight is 219 lb 1.6 oz (99.383 kg). His oral temperature is 97.5 F (36.4 C). His blood pressure is 135/86 and his pulse is 65. His respiration is 18.   Alert and oriented. Head and neck examination: Grossly unremarkable. Nodes: Without palpable cervical or supraclavicular lymphadenopathy. Chest: Lungs clear. Back: Without spinal or CVA tenderness. Heart: Regular in rhythm. Abdomen midline abdominal scar, without masses organomegaly. Extremities without edema.   LABORATORY DATA:  Lab Results  Component Value Date   WBC 5.2 04/04/2012   HGB 13.2 04/04/2012   HCT 40.2 04/04/2012   MCV 85.2 04/04/2012   PLT 208 04/04/2012   Lab Results  Component Value Date   NA 141 04/04/2012   K 3.5 04/04/2012   CL 103 04/04/2012   CO2 28 04/04/2012   Lab Results  Component Value Date   ALT 16 03/10/2012   AST 21 04/04/2012   ALKPHOS 70 04/04/2012   BILITOT 0.50 04/04/2012      IMPRESSION: Metastatic carcinoid tumor to liver. His management options include continuation of systemic therapy,Yttrium-90 microsphere therapy for possible stereotactic body radiosurgery. Based on my limited knowledge, I feel that he would be a candidate for microsphere therapy rather than stereotactic body radiosurgery. I spoke with Dr. Truett Perna who indicates that a referral should be made to interventional radiology for consideration of microsphere therapy.   PLAN: As discussed above. Dr. Truett Perna make a referral to interventional radiology.   I spent 40 minutes minutes face to face with the patient and more than 50% of that time was spent in counseling and/or coordination of care.

## 2012-04-07 NOTE — Progress Notes (Signed)
See progress note under physician encounter. 

## 2012-04-11 ENCOUNTER — Telehealth: Payer: Self-pay | Admitting: *Deleted

## 2012-04-11 NOTE — Telephone Encounter (Signed)
Left VM for patient to call office for lab results. Message to scheduler to follow up on status of interventional radiology referral for Y-90

## 2012-04-11 NOTE — Telephone Encounter (Signed)
Message copied by Wandalee Ferdinand on Mon Apr 11, 2012  6:22 PM ------      Message from: [name] B      Created: Thu Apr 07, 2012  8:57 PM       Please call patient, stable chromagranin A, proceed with Y-90 eval in IR

## 2012-04-12 ENCOUNTER — Telehealth: Payer: Self-pay | Admitting: Oncology

## 2012-04-12 ENCOUNTER — Telehealth: Payer: Self-pay | Admitting: *Deleted

## 2012-04-12 ENCOUNTER — Other Ambulatory Visit: Payer: Self-pay | Admitting: Oncology

## 2012-04-12 DIAGNOSIS — C7A021 Malignant carcinoid tumor of the cecum: Secondary | ICD-10-CM

## 2012-04-12 NOTE — Telephone Encounter (Signed)
Left VM for patient to call office regarding lab results. 

## 2012-04-12 NOTE — Telephone Encounter (Signed)
Informed Dr. Truett Perna that he needs to change IR orders to IR Rad Eval and to select [facility]. Once this is done Tammy at Dr. Antonietta Jewel office will see the referral and call pt with appt

## 2012-04-13 ENCOUNTER — Telehealth: Payer: Self-pay | Admitting: *Deleted

## 2012-04-13 ENCOUNTER — Telehealth: Payer: Self-pay | Admitting: Oncology

## 2012-04-13 NOTE — Telephone Encounter (Signed)
Message copied by Wandalee Ferdinand on Wed Apr 13, 2012  3:13 PM ------      Message from: [name] B      Created: Thu Apr 07, 2012  8:57 PM       Please call patient, stable chromagranin A, proceed with Y-90 eval in IR

## 2012-04-13 NOTE — Telephone Encounter (Signed)
Left message on VM that chromogranin A is stable. Proceed with Y-90 procedure. Instructed him to call office if he has not heard anything from radiology by end of the week.

## 2012-04-13 NOTE — Telephone Encounter (Signed)
received a call from Cambalache from [facility] imaging and she stated that she has been trying to contact pt to scheduled appt.  one that is done she will rtn with appt d/t

## 2012-04-13 NOTE — Telephone Encounter (Signed)
called Tommy Conley at Dr. Fredia Sorrow office lmovm to rtn call to me to confirm that she has referral to scheduled appt

## 2012-04-14 ENCOUNTER — Telehealth: Payer: Self-pay | Admitting: *Deleted

## 2012-04-14 ENCOUNTER — Encounter: Payer: Self-pay | Admitting: Radiology

## 2012-04-14 NOTE — Telephone Encounter (Addendum)
Phone call from Interventional radiology (Henri)-- they are unable to reach patient re: appointment for Y90 consult.  They left messages on all of the phone numbers listed in epic.  They will send a letter to the patient today if no success in reaching him by phone. This RN also attempted to reach patient by phone without success. Messages were left to call back.   Dr. Truett Perna notified of above. [phone]-Received call from Henri in IR that they were able to reach the pt.  He is scheduled for a consult with Dr. Miles Costain on 04/20/12.

## 2012-04-20 ENCOUNTER — Other Ambulatory Visit: Payer: Self-pay | Admitting: Interventional Radiology

## 2012-04-20 ENCOUNTER — Ambulatory Visit
Admission: RE | Admit: 2012-04-20 | Discharge: 2012-04-20 | Disposition: A | Discharge status: Home / Self Care | Payer: 59 | Source: Ambulatory Visit | Attending: Oncology | Admitting: Oncology

## 2012-04-20 DIAGNOSIS — C7A021 Malignant carcinoid tumor of the cecum: Secondary | ICD-10-CM

## 2012-04-20 DIAGNOSIS — C787 Secondary malignant neoplasm of liver and intrahepatic bile duct: Secondary | ICD-10-CM

## 2012-04-20 NOTE — Progress Notes (Signed)
Appetite, fair. Weight stable.  Denies nausea, vomiting or diarrhea.    Denies bloating or abdominal pain.  Denies night sweats or fevers.    Patient is currently working full time.   Denies unusual fatigue.  States that his last chemo Rx was 04/06/2012.

## 2012-04-20 NOTE — Progress Notes (Signed)
Patient ID: Tommy Conley, male   DOB: January 26, 1951, 61 y.o.   MRN: [id number] *RADIOLOGY REPORT*  NEW PATIENT OFFICE VISIT - LEVEL III [phone])  Chief Complaint: Metastatic carcinoid tumor of the cecum to the  liver. Evaluation for Y 90 radioembolization. The patient is a  nonoperative candidate.  History: 61 year old male with a history of metastatic carcinoid  tumor of the cecum to the liver. He presented with diarrhea and  had a colonoscopy August 2012 demonstrating a cecal mass.  Pathology demonstrated well differentiated carcinoid tumor. He  underwent right colectomy and liver biopsy. He was found to have  metastatic carcinoid tumor to the liver, stage IV. Since  recovering from surgery he has been on Xeloda/temozolomide for  chemotherapy. His most recent CT scan abdomen pelvis 04/04/2012  demonstrated a mixed response with some progression of hepatic  metastases and slight enlargement of central mesenteric lymph  nodes. Overall he is feeling very well. Stable weight. He  continues to work full time. He has several loose stools every  day. No significant abdominal pain.  E C O G status: 0  Past Medical History: Hypertension, Barrett's esophagus, reflux  disease, colonic polyps, cecal carcinoid tumor metastatic to the  liver.  Medications: See attached list  Allergies: No known drug allergies  Social History: Noncontributory  Family History: Negative for colonic polyps, colon cancer, stomach  cancer. Father died for cardiac disease at 52.  Review of Systems: No significant chest pain, abdominal pain,  shortness of breath, flank or back pain. No headaches. Pertinent  positives as above.  Exam: Afebrile, vital signs stable. Alert and oriented. Well  developed well-nourished male. No apparent distress.  Cardiovascular: Regular rate and rhythm. Chest: Clear  bilaterally. Abdomen: Healed midline scar. No tenderness or  distention. No masses or organomegaly. Extremities: No    peripheral edema. Normal femoral and pedal pulses.  Labs: Normal LFTs. Total bilirubin 0.5 (04/04/2012) normal CBC.  Today, I spent approximately 40 minutes with the patient and  reviewed the previous CT images. Our discussion centered around Y  90 radioembolization treatment. We discussed the pre-procedure  evaluation including CTA abdomen. We also discussed pretreatment  angiography with G D A coil embolization and liver to lung shunt  calculation. If he is deemed a candidate, Y90 radioembolization  could be performed in the next 2-3 weeks. He does wish to proceed  with continued workup and pretreatment angiography/G D A  embolization. The studies will be scheduled in the next few weeks.  Assessment and Plan: Metastatic carcinoid tumor to the liver.  Mixed response to chemotherapy. The patient appears to be of  excellent functional status with normal lab values. He appears to  be a great candidate for Y 90 radioembolization therapy.  Plan: CTA abdomen to assess vascular anatomy  Pretreatment mesenteric angiography with G D A embolization and  liver to lung shunt calculation  Original Report Authenticated By: Judie Petit. TREVOR Quamaine Webb, M.D.

## 2012-04-21 ENCOUNTER — Other Ambulatory Visit: Payer: Self-pay | Admitting: *Deleted

## 2012-04-21 ENCOUNTER — Ambulatory Visit (HOSPITAL_COMMUNITY)
Admission: RE | Admit: 2012-04-21 | Discharge: 2012-04-21 | Disposition: A | Discharge status: Home / Self Care | Payer: 59 | Source: Ambulatory Visit | Attending: Interventional Radiology | Admitting: Interventional Radiology

## 2012-04-21 DIAGNOSIS — C189 Malignant neoplasm of colon, unspecified: Secondary | ICD-10-CM | POA: Insufficient documentation

## 2012-04-21 DIAGNOSIS — C787 Secondary malignant neoplasm of liver and intrahepatic bile duct: Secondary | ICD-10-CM

## 2012-04-21 DIAGNOSIS — R599 Enlarged lymph nodes, unspecified: Secondary | ICD-10-CM | POA: Insufficient documentation

## 2012-04-21 MED ORDER — IOHEXOL 350 MG/ML SOLN
100.0000 mL | Freq: Once | INTRAVENOUS | Status: AC | PRN
  Administered 2012-04-21: 100 mL via INTRAVENOUS

## 2012-04-21 NOTE — Telephone Encounter (Signed)
THIS REQUEST FOR TEMODAR WAS GIVEN TO DR.SHERRILL'S NURSE, SUSAN COWARD,RN.

## 2012-04-25 ENCOUNTER — Other Ambulatory Visit (HOSPITAL_COMMUNITY): Payer: Self-pay | Admitting: Interventional Radiology

## 2012-04-25 DIAGNOSIS — D3A021 Benign carcinoid tumor of the cecum: Secondary | ICD-10-CM

## 2012-04-25 DIAGNOSIS — C7B02 Secondary carcinoid tumors of liver: Secondary | ICD-10-CM

## 2012-04-29 ENCOUNTER — Other Ambulatory Visit: Payer: Self-pay | Admitting: Physician Assistant

## 2012-05-02 ENCOUNTER — Other Ambulatory Visit: Payer: Self-pay | Admitting: Radiology

## 2012-05-04 ENCOUNTER — Other Ambulatory Visit: Payer: Self-pay | Admitting: Radiology

## 2012-05-04 ENCOUNTER — Telehealth (HOSPITAL_COMMUNITY): Payer: Self-pay | Admitting: Hematology and Oncology

## 2012-05-04 NOTE — Progress Notes (Signed)
Addended by: Sherilyn Dacosta T on: 05/04/2012 11:42 AM   Modules accepted: Orders

## 2012-05-05 ENCOUNTER — Ambulatory Visit (HOSPITAL_COMMUNITY)
Admission: RE | Admit: 2012-05-05 | Discharge: 2012-05-05 | Disposition: A | Discharge status: Home / Self Care | Payer: 59 | Source: Ambulatory Visit | Attending: Interventional Radiology | Admitting: Interventional Radiology

## 2012-05-05 ENCOUNTER — Encounter (HOSPITAL_COMMUNITY): Payer: Self-pay

## 2012-05-05 ENCOUNTER — Other Ambulatory Visit (HOSPITAL_COMMUNITY): Payer: Self-pay | Admitting: Interventional Radiology

## 2012-05-05 ENCOUNTER — Encounter (HOSPITAL_COMMUNITY)
Admission: RE | Admit: 2012-05-05 | Discharge: 2012-05-05 | Disposition: A | Discharge status: Home / Self Care | Payer: 59 | Source: Ambulatory Visit | Attending: Interventional Radiology | Admitting: Interventional Radiology

## 2012-05-05 DIAGNOSIS — C7B02 Secondary carcinoid tumors of liver: Secondary | ICD-10-CM

## 2012-05-05 DIAGNOSIS — D3A021 Benign carcinoid tumor of the cecum: Secondary | ICD-10-CM

## 2012-05-05 DIAGNOSIS — C787 Secondary malignant neoplasm of liver and intrahepatic bile duct: Secondary | ICD-10-CM | POA: Insufficient documentation

## 2012-05-05 DIAGNOSIS — C189 Malignant neoplasm of colon, unspecified: Secondary | ICD-10-CM | POA: Insufficient documentation

## 2012-05-05 LAB — COMPREHENSIVE METABOLIC PANEL
AST: 16 U/L (ref 0–37)
CO2: 25 mEq/L (ref 19–32)
Calcium: 9.4 mg/dL (ref 8.4–10.5)
Creatinine, Ser: 0.85 mg/dL (ref 0.50–1.35)
GFR calc Af Amer: >90 mL/min (ref >90)
GFR calc non Af Amer: >90 mL/min (ref >90)

## 2012-05-05 LAB — CBC
Hemoglobin: 12.9 g/dL — ABNORMAL LOW (ref 13.0–17.0)
MCH: 28 pg (ref 26.0–34.0)
MCHC: 33.5 g/dL (ref 30.0–36.0)
RDW: 15.3 % (ref 11.5–15.5)

## 2012-05-05 LAB — PROTIME-INR: INR: 1.02 (ref 0.00–1.49)

## 2012-05-05 MED ORDER — SODIUM CHLORIDE 0.9 % IV SOLN
INTRAVENOUS | Status: DC
  Administered 2012-05-05: 09:00:00 via INTRAVENOUS

## 2012-05-05 MED ORDER — PANTOPRAZOLE SODIUM 40 MG IV SOLR
INTRAVENOUS | Status: AC
  Filled 2012-05-05: qty 40

## 2012-05-05 MED ORDER — PANTOPRAZOLE SODIUM 40 MG IV SOLR
40.0000 mg | Freq: Once | INTRAVENOUS | Status: AC
  Administered 2012-05-05: 40 mg via INTRAVENOUS

## 2012-05-05 MED ORDER — MIDAZOLAM HCL 5 MG/5ML IJ SOLN
INTRAMUSCULAR | Status: AC | PRN
  Administered 2012-05-05: 1 mg via INTRAVENOUS
  Administered 2012-05-05: 2 mg via INTRAVENOUS
  Administered 2012-05-05: 1 mg via INTRAVENOUS

## 2012-05-05 MED ORDER — MIDAZOLAM HCL 2 MG/2ML IJ SOLN
INTRAMUSCULAR | Status: AC
  Filled 2012-05-05: qty 6

## 2012-05-05 MED ORDER — FENTANYL CITRATE 0.05 MG/ML IJ SOLN
INTRAMUSCULAR | Status: AC | PRN
  Administered 2012-05-05: 100 ug via INTRAVENOUS
  Administered 2012-05-05 (×2): 50 ug via INTRAVENOUS

## 2012-05-05 MED ORDER — OCTREOTIDE ACETATE 100 MCG/ML IJ SOLN
200.0000 ug | Freq: Once | INTRAMUSCULAR | Status: AC
  Administered 2012-05-05: 200 ug via INTRAVENOUS
  Filled 2012-05-05: qty 2

## 2012-05-05 MED ORDER — TECHNETIUM TO 99M ALBUMIN AGGREGATED
5.3000 | Freq: Once | INTRAVENOUS | Status: AC | PRN
  Administered 2012-05-05: 5 via INTRAVENOUS

## 2012-05-05 MED ORDER — IOHEXOL 300 MG/ML  SOLN
134.0000 mL | Freq: Once | INTRAMUSCULAR | Status: AC | PRN
  Administered 2012-05-05: 134 mL via INTRA_ARTERIAL

## 2012-05-05 MED ORDER — PIPERACILLIN-TAZOBACTAM 3.375 G IVPB: 3.3750 g | Freq: Once | INTRAVENOUS | Status: DC

## 2012-05-05 MED ORDER — KETOROLAC TROMETHAMINE 30 MG/ML IJ SOLN
INTRAMUSCULAR | Status: DC
Start: 2012-05-05 — End: 2012-05-06
  Filled 2012-05-05: qty 1

## 2012-05-05 MED ORDER — PIPERACILLIN-TAZOBACTAM 3.375 G IVPB 30 MIN
3.3750 g | Freq: Once | INTRAVENOUS | Status: AC
  Administered 2012-05-05: 3.375 g via INTRAVENOUS
  Filled 2012-05-05: qty 50

## 2012-05-05 MED ORDER — ONDANSETRON HCL 4 MG/2ML IJ SOLN
INTRAMUSCULAR | Status: AC
  Filled 2012-05-05: qty 2

## 2012-05-05 MED ORDER — ONDANSETRON HCL 4 MG/2ML IJ SOLN
4.0000 mg | Freq: Once | INTRAMUSCULAR | Status: AC
  Administered 2012-05-05: 4 mg via INTRAVENOUS

## 2012-05-05 MED ORDER — KETOROLAC TROMETHAMINE 30 MG/ML IJ SOLN
30.0000 mg | Freq: Once | INTRAMUSCULAR | Status: AC
  Administered 2012-05-05: 30 mg via INTRAVENOUS

## 2012-05-05 MED ORDER — DEXAMETHASONE SODIUM PHOSPHATE 10 MG/ML IJ SOLN
INTRAMUSCULAR | Status: AC
  Filled 2012-05-05: qty 2

## 2012-05-05 MED ORDER — FENTANYL CITRATE 0.05 MG/ML IJ SOLN
INTRAMUSCULAR | Status: AC
  Filled 2012-05-05: qty 6

## 2012-05-05 MED ORDER — LIDOCAINE HCL 1 % IJ SOLN
INTRAMUSCULAR | Status: AC
  Filled 2012-05-05: qty 20

## 2012-05-05 MED ORDER — DEXAMETHASONE SODIUM PHOSPHATE 10 MG/ML IJ SOLN
20.0000 mg | Freq: Once | INTRAMUSCULAR | Status: AC
  Administered 2012-05-05: 20 mg via INTRAVENOUS

## 2012-05-05 MED ORDER — SODIUM CHLORIDE 0.9 % IV SOLN: 200.0000 ug/h | Freq: Once | INTRAVENOUS | Status: DC

## 2012-05-05 NOTE — Procedures (Signed)
Successful mesenteric angiograms with GDA coil embo, and rt hepatic artery MAA injection No comp Rt CFA site closed with exoseal Stable Full report in pacs

## 2012-05-05 NOTE — ED Notes (Signed)
Pt to be transported via stretcher to Nuclear Medicine with RN and monitor for imaging.

## 2012-05-05 NOTE — Discharge Instructions (Signed)
Arteriogram Care After These instructions give you information on caring for yourself after your procedure. Your doctor may also give you more specific instructions. Call your doctor if you have any problems or questions after your procedure. HOME CARE  Stay in bed the rest of the day.   Keep your leg straight for at least 6 hours.   Do not lift anything heavier than 10 pounds (about a gallon of milk) for 2 days.   Do not walk a lot, run, or drive for 2 days.   Return to normal activities in 2 days or as told by your doctor.  Finding out the results of your test Ask when your test results will be ready. Make sure you get your test results. GET HELP RIGHT AWAY IF:   You have fever of 102 F (38.9 C) or higher.   You have more pain in your leg.   The leg that was cut is:   Bleeding.   Puffy (swollen) or red.   Cold.   Pale or changes color.   Weak.   Tingly or numb.  If you go to the Emergency Room, tell your nurse that you have had an arteriogram. Take this paper with you to show the nurse. MAKE SURE YOU:  Understand these instructions.   Will watch your condition.   Will get help right away if you are not doing well or get worse.  Document Released: 03/05/2009 Document Revised: 11/26/2011 Document Reviewed: 03/05/2009 [employer] Patient Information 2012 [employer]. 

## 2012-05-05 NOTE — H&P (Signed)
Tommy Conley is an 61 y.o. male.   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  Presents today for Y-90 procedure.  HPI: See consultation note below :  RADIOLOGY REPORT*   NEW PATIENT OFFICE VISIT - LEVEL III ([id number])   Chief Complaint:  Metastatic carcinoid tumor of the cecum to the liver.  Evaluation for Y 90 radioembolization.  The patient is a nonoperative candidate.   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  He presented with diarrhea and had a colonoscopy August 2012 demonstrating a cecal mass. Pathology demonstrated well differentiated carcinoid tumor.  He underwent right colectomy and liver biopsy.  He was found to have metastatic carcinoid tumor to the liver, stage IV.  Since recovering from surgery he has been on Xeloda/temozolomide for chemotherapy.  His most recent CT scan abdomen pelvis 04/04/2012 demonstrated a mixed response with some progression of hepatic metastases and slight enlargement of central mesenteric lymph nodes.  Overall he is feeling very well.  Stable weight.  He continues to work full time.  He has several loose stools every day.  No significant abdominal pain.   E C O G status:  0   Past Medical History:  Hypertension, Barrett's esophagus, reflux disease, colonic polyps, cecal carcinoid tumor metastatic to the liver.   Medications:  See attached list   Allergies:  No known drug allergies   Social History:  Noncontributory   Family History:  Negative for colonic polyps, colon cancer, stomach cancer.  Father died for cardiac disease at 37.   Review of Systems:  No significant chest pain, abdominal pain, shortness of breath, flank or back pain.  No headaches.  Pertinent positives as above.   Exam:  Afebrile, vital signs stable.  Alert and oriented.  Well developed well-nourished male.  No apparent distress. Cardiovascular:  Regular rate and rhythm.   Chest:  Clear bilaterally.  Abdomen:  Healed midline scar.  No tenderness or distention.  No masses or organomegaly.  Extremities:  No peripheral edema.  Normal femoral and pedal pulses.   Labs:  Normal LFTs.  Total bilirubin 0.5 (04/04/2012)  normal CBC.   Today, I spent approximately 40 minutes with the patient and reviewed the previous CT images.  Our discussion centered around Y 90 radioembolization treatment.  We discussed the pre-procedure evaluation including CTA abdomen.  We also discussed pretreatment angiography with G D A coil embolization and liver to lung shunt calculation.  If he is deemed a candidate, Y90 radioembolization could be performed in the next 2-3 weeks.  He does wish to proceed with continued workup and pretreatment angiography/G D A embolization.  The studies will be scheduled in the next few weeks.   Assessment and Plan:  Metastatic carcinoid tumor to the liver. Mixed response to chemotherapy.  The patient appears to be of excellent functional status with normal lab values.  He appears to be a great candidate for Y 90 radioembolization therapy.   Plan:  CTA abdomen to assess vascular anatomy Pretreatment mesenteric angiography with G D A embolization and liver to lung shunt calculation   Original Report Authenticated By: Judie Petit. Ruel Favors, M.D.  Results from today's procedure / examination :   Past Medical History  Diagnosis Date  . Hypertension   . Barrett's esophagus   . GERD (gastroesophageal reflux disease)   . Colon polyp   . Cancer     colon ca with liver lesions  .  Carcinoid tumor of cecum     Past Surgical History  Procedure Date  . Colonoscopy 07/2011  . Upper gastrointestinal endoscopy     hx barrett's esophagus  . Colectomy 09/29/11    lap - right with liver bx  . Ight colon/ileum resection 09/29/2011  . Wedge liver biopsy 09/29/11    right liver lesion    Family History  Problem Relation Age of Onset  . Colon polyps Neg Hx   .  Colon cancer Neg Hx   . Stomach cancer Neg Hx   . Cancer Neg Hx    Social History:  reports that he quit smoking about 2 months ago. His smoking use included Cigarettes. He has a 4.25 pack-year smoking history. He has never used smokeless tobacco. He reports that he does not drink alcohol or use illicit drugs.  Allergies: No Known Allergies   Results for orders placed during the hospital encounter of 05/05/12 (from the past 48 hour(s))  APTT     Status: Normal   Collection Time   05/05/12  8:02 AM      Component Value Range Comment   aPTT 31  24 - 37 (seconds)   CBC     Status: Abnormal   Collection Time   05/05/12  8:02 AM      Component Value Range Comment   WBC 5.4  4.0 - 10.5 (K/uL)    RBC 4.61  4.22 - 5.81 (MIL/uL)    Hemoglobin 12.9 (*) 13.0 - 17.0 (g/dL)    HCT 16.1 (*) 09.6 - 52.0 (%)    MCV 83.5  78.0 - 100.0 (fL)    MCH 28.0  26.0 - 34.0 (pg)    MCHC 33.5  30.0 - 36.0 (g/dL)    RDW 04.5  40.9 - 81.1 (%)    Platelets 211  150 - 400 (K/uL)   COMPREHENSIVE METABOLIC PANEL     Status: Abnormal   Collection Time   05/05/12  8:02 AM      Component Value Range Comment   Sodium 139  135 - 145 (mEq/L)    Potassium 3.4 (*) 3.5 - 5.1 (mEq/L)    Chloride 105  96 - 112 (mEq/L)    CO2 25  19 - 32 (mEq/L)    Glucose, Bld 93  70 - 99 (mg/dL)    BUN 11  6 - 23 (mg/dL)    Creatinine, Ser 9.14  0.50 - 1.35 (mg/dL)    Calcium 9.4  8.4 - 10.5 (mg/dL)    Total Protein 6.9  6.0 - 8.3 (g/dL)    Albumin 4.1  3.5 - 5.2 (g/dL)    AST 16  0 - 37 (U/L)    ALT 12  0 - 53 (U/L)    Alkaline Phosphatase 68  39 - 117 (U/L)    Total Bilirubin 0.5  0.3 - 1.2 (mg/dL)    GFR calc non Af Amer >90  >90 (mL/min)    GFR calc Af Amer >90  >90 (mL/min)   PROTIME-INR     Status: Normal   Collection Time   05/05/12  8:02 AM      Component Value Range Comment   Prothrombin Time 13.6  11.6 - 15.2 (seconds)    INR 1.02  0.00 - 1.49     Review of Systems  Constitutional: Negative for fever, chills,  weight loss and malaise/fatigue.  Respiratory: Negative.   Cardiovascular: Negative.   Gastrointestinal: Positive for heartburn and diarrhea. Negative for nausea and  vomiting.  Genitourinary: Negative.   Musculoskeletal: Negative.   Psychiatric/Behavioral: Negative.     Blood pressure 138/87, pulse 78, temperature 97.9 F (36.6 C), temperature source Oral, resp. rate 16, height 6\' 1"  (1.854 m), weight 218 lb (98.884 kg), SpO2 96.00%. Physical Exam  Constitutional: He is oriented to person, place, and time. He appears well-developed and well-nourished. No distress.  HENT:  Head: Normocephalic and atraumatic.  Neck: Normal range of motion.  Cardiovascular: Normal rate, regular rhythm and normal heart sounds.  Exam reveals no gallop and no friction rub.   No murmur heard. Respiratory: Effort normal and breath sounds normal. No respiratory distress. He has no wheezes. He has no rales.  GI: Soft. Bowel sounds are normal.  Musculoskeletal: Normal range of motion. He exhibits no edema.  Neurological: He is alert and oriented to person, place, and time.  Skin: Skin is warm and dry.  Psychiatric: He has a normal mood and affect. His behavior is normal. Judgment and thought content normal.     Assessment/Plan Patient presents today for Y-90 procedure to treat metastatic carcinoid to liver.  Patient has been seen in consultation.  Examination details, benefits and potential complications reviewed with patient again today with all his questions answered to his satisfaction.  Written consent obtained. Labs WNL to proceed.   Makennah Omura D 05/05/2012, 9:09 AM

## 2012-05-05 NOTE — ED Notes (Signed)
RFA closed using exoseal closure device by Dr. Miles Costain.  R groin level 0, 4+RDP.

## 2012-05-11 LAB — CHROMOGRANIN A: Chromogranin A: 226 ng/mL — ABNORMAL HIGH (ref 1.9–15.0)

## 2012-05-12 ENCOUNTER — Other Ambulatory Visit: Payer: 59 | Admitting: Lab

## 2012-05-12 DIAGNOSIS — C7A021 Malignant carcinoid tumor of the cecum: Secondary | ICD-10-CM

## 2012-05-17 ENCOUNTER — Other Ambulatory Visit: Payer: Self-pay | Admitting: Physician Assistant

## 2012-05-18 ENCOUNTER — Ambulatory Visit (HOSPITAL_BASED_OUTPATIENT_CLINIC_OR_DEPARTMENT_OTHER): Payer: 59 | Admitting: Oncology

## 2012-05-18 ENCOUNTER — Telehealth: Payer: Self-pay | Admitting: Oncology

## 2012-05-18 ENCOUNTER — Other Ambulatory Visit: Payer: Self-pay | Admitting: Radiology

## 2012-05-18 ENCOUNTER — Telehealth (HOSPITAL_COMMUNITY): Payer: Self-pay | Admitting: Interventional Radiology

## 2012-05-18 VITALS — BP 126/85 | HR 87 | Temp 99.2°F | Ht 73.0 in | Wt 217.1 lb

## 2012-05-18 DIAGNOSIS — C787 Secondary malignant neoplasm of liver and intrahepatic bile duct: Secondary | ICD-10-CM

## 2012-05-18 DIAGNOSIS — C7A021 Malignant carcinoid tumor of the cecum: Secondary | ICD-10-CM

## 2012-05-18 LAB — COMPREHENSIVE METABOLIC PANEL
AST: 14 U/L (ref 0–37)
Alkaline Phosphatase: 57 U/L (ref 39–117)
BUN: 14 mg/dL (ref 6–23)
Creatinine, Ser: 0.93 mg/dL (ref 0.50–1.35)
Total Bilirubin: 0.6 mg/dL (ref 0.3–1.2)

## 2012-05-18 NOTE — Progress Notes (Signed)
   [facility]    OFFICE PROGRESS NOTE   INTERVAL HISTORY:   He returns as scheduled. He continues to have 3-4 bowel movements per day. No fever or flushing. No pain. He reports mild soreness at the top surface of the tongue.  Tommy Conley underwent angiography and gastroduodenal artery embolization on 05/05/2012. He is scheduled for Y- 90 treatment tomorrow.  Objective:  Vital signs in last 24 hours:  Blood pressure 126/85, pulse 87, temperature 99.2 F (37.3 C), temperature source Oral, height 6\' 1"  (1.854 m), weight 217 lb 1.6 oz (98.476 kg).    HEENT: Small ulcer at the underside of the left tongue, mild whitecoat at the anterior surface of the tongue. No other ulcers. No buccal thrush Resp: Lungs clear bilaterally Cardio: Regular rate and rhythm GI: No hepatomegaly, nontender, no mass Vascular: No leg edema   Lab Results:  Lab Results  Component Value Date   WBC 5.4 05/05/2012   HGB 12.9* 05/05/2012   HCT 38.5* 05/05/2012   MCV 83.5 05/05/2012   PLT 211 05/05/2012      Medications: I have reviewed the patient's current medications.  Assessment/Plan: 1. 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  a. Status post a right colon/ileum resection on 09/29/2011.  b. Wedge biopsy of a right liver lesion on 09/29/2011 confirmed metastatic well-differentiated neuroendocrine carcinoma.  c. CT of the abdomen 08/25/2011 consistent with multiple liver metastases.  d. Elevated preoperative chromogranin A level and a 24-hour urine 5-HIAA level. Stable on repeat 11/16/2011. e. Restaging CT on 12/28/2011 confirmed slight progression of the metastatic liver lesions. f. Status post 3 cycles of Xeloda/temozolomide with cycle 3 initiated on 03/12/2012 g. Restaging CT of the abdomen 04/04/2012 revealed slight progression of the metastatic liver lesions and transverse mesocolon  adenopathy 2. History of diarrhea (3 to 4 times per day), predating surgery for 8 months. He has occasional loose stools. 3. Hypertension. 4. History of gastroesophageal reflux disease. 5. Rectal and ascending colon polyps. 6. Iron deficiency anemia-improved 7. Small tongue ulcer-? Related to chemotherapy   Disposition:  He appears stable. He has undergone treatment planning for radioembolization. Treatment is scheduled for 05/19/2012. Tommy Conley will return for an office visit in 3 weeks.  We will obtain a repeat chromogranin A level one to 2 months after the Y-90 therapy.   [name], MD  05/18/2012  5:27 PM

## 2012-05-18 NOTE — Telephone Encounter (Signed)
appts made and printed for pt aom °

## 2012-05-19 ENCOUNTER — Ambulatory Visit (HOSPITAL_COMMUNITY)
Admission: RE | Admit: 2012-05-19 | Discharge: 2012-05-19 | Disposition: A | Discharge status: Home / Self Care | Payer: 59 | Source: Ambulatory Visit | Attending: Interventional Radiology | Admitting: Interventional Radiology

## 2012-05-19 ENCOUNTER — Other Ambulatory Visit (HOSPITAL_COMMUNITY): Payer: Self-pay | Admitting: Interventional Radiology

## 2012-05-19 ENCOUNTER — Encounter (HOSPITAL_COMMUNITY): Payer: Self-pay

## 2012-05-19 ENCOUNTER — Ambulatory Visit (HOSPITAL_COMMUNITY): Payer: 59

## 2012-05-19 DIAGNOSIS — I10 Essential (primary) hypertension: Secondary | ICD-10-CM | POA: Insufficient documentation

## 2012-05-19 DIAGNOSIS — C7A021 Malignant carcinoid tumor of the cecum: Secondary | ICD-10-CM | POA: Insufficient documentation

## 2012-05-19 DIAGNOSIS — D3A021 Benign carcinoid tumor of the cecum: Secondary | ICD-10-CM

## 2012-05-19 DIAGNOSIS — C7B02 Secondary carcinoid tumors of liver: Secondary | ICD-10-CM

## 2012-05-19 DIAGNOSIS — C787 Secondary malignant neoplasm of liver and intrahepatic bile duct: Secondary | ICD-10-CM | POA: Insufficient documentation

## 2012-05-19 DIAGNOSIS — Z87891 Personal history of nicotine dependence: Secondary | ICD-10-CM | POA: Insufficient documentation

## 2012-05-19 LAB — COMPREHENSIVE METABOLIC PANEL
ALT: 14 U/L (ref 0–53)
Alkaline Phosphatase: 62 U/L (ref 39–117)
CO2: 25 mEq/L (ref 19–32)
GFR calc Af Amer: >90 mL/min (ref >90)
GFR calc non Af Amer: >90 mL/min (ref >90)
Glucose, Bld: 97 mg/dL (ref 70–99)
Potassium: 3.5 mEq/L (ref 3.5–5.1)
Sodium: 136 mEq/L (ref 135–145)

## 2012-05-19 LAB — CBC
HCT: 35.8 % — ABNORMAL LOW (ref 39.0–52.0)
MCV: 84.8 fL (ref 78.0–100.0)
RBC: 4.22 MIL/uL (ref 4.22–5.81)
WBC: 6.6 10*3/uL (ref 4.0–10.5)

## 2012-05-19 MED ORDER — SODIUM CHLORIDE 0.9 % IV SOLN
INTRAVENOUS | Status: DC
  Administered 2012-05-19 (×2): via INTRAVENOUS

## 2012-05-19 MED ORDER — FENTANYL CITRATE 0.05 MG/ML IJ SOLN
INTRAMUSCULAR | Status: AC
  Filled 2012-05-19: qty 6

## 2012-05-19 MED ORDER — KETOROLAC TROMETHAMINE 30 MG/ML IJ SOLN
30.0000 mg | Freq: Once | INTRAMUSCULAR | Status: AC
  Administered 2012-05-19: 30 mg via INTRAVENOUS

## 2012-05-19 MED ORDER — CIPROFLOXACIN IN D5W 400 MG/200ML IV SOLN
INTRAVENOUS | Status: AC
  Filled 2012-05-19: qty 200

## 2012-05-19 MED ORDER — ONDANSETRON HCL 4 MG/2ML IJ SOLN
INTRAMUSCULAR | Status: AC
  Filled 2012-05-19: qty 2

## 2012-05-19 MED ORDER — IOHEXOL 300 MG/ML  SOLN: 20.0000 mL | Freq: Once | INTRAMUSCULAR | Status: DC | PRN

## 2012-05-19 MED ORDER — KETOROLAC TROMETHAMINE 30 MG/ML IJ SOLN
INTRAMUSCULAR | Status: AC
  Filled 2012-05-19: qty 1

## 2012-05-19 MED ORDER — FENTANYL CITRATE 0.05 MG/ML IJ SOLN
INTRAMUSCULAR | Status: AC | PRN
  Administered 2012-05-19 (×2): 100 ug via INTRAVENOUS

## 2012-05-19 MED ORDER — OCTREOTIDE ACETATE 100 MCG/ML IJ SOLN
200.0000 ug | Freq: Once | INTRAMUSCULAR | Status: AC
  Administered 2012-05-19: 200 ug via INTRAVENOUS
  Filled 2012-05-19: qty 2

## 2012-05-19 MED ORDER — PANTOPRAZOLE SODIUM 40 MG IV SOLR
40.0000 mg | Freq: Once | INTRAVENOUS | Status: AC
  Administered 2012-05-19: 40 mg via INTRAVENOUS
  Filled 2012-05-19: qty 40

## 2012-05-19 MED ORDER — YTTRIUM 90 INJECTION: 39.4000 | INJECTION | Freq: Once | INTRAVENOUS | Status: DC

## 2012-05-19 MED ORDER — LIDOCAINE HCL 1 % IJ SOLN
INTRAMUSCULAR | Status: AC
  Filled 2012-05-19: qty 20

## 2012-05-19 MED ORDER — CIPROFLOXACIN IN D5W 400 MG/200ML IV SOLN: 400.0000 mg | Freq: Once | INTRAVENOUS | Status: DC

## 2012-05-19 MED ORDER — DEXAMETHASONE SODIUM PHOSPHATE 10 MG/ML IJ SOLN
20.0000 mg | Freq: Once | INTRAMUSCULAR | Status: AC
  Administered 2012-05-19: 20 mg via INTRAVENOUS
  Filled 2012-05-19: qty 2

## 2012-05-19 MED ORDER — PIPERACILLIN-TAZOBACTAM 3.375 G IVPB
3.3750 g | Freq: Once | INTRAVENOUS | Status: AC
  Administered 2012-05-19: 3.375 g via INTRAVENOUS
  Filled 2012-05-19: qty 50

## 2012-05-19 MED ORDER — ONDANSETRON HCL 4 MG/2ML IJ SOLN
4.0000 mg | Freq: Once | INTRAMUSCULAR | Status: AC
  Administered 2012-05-19: 4 mg via INTRAVENOUS

## 2012-05-19 MED ORDER — FENTANYL CITRATE 0.05 MG/ML IJ SOLN
INTRAMUSCULAR | Status: AC
  Filled 2012-05-19: qty 2

## 2012-05-19 MED ORDER — MIDAZOLAM HCL 5 MG/5ML IJ SOLN
INTRAMUSCULAR | Status: AC | PRN
  Administered 2012-05-19 (×2): 2 mg via INTRAVENOUS

## 2012-05-19 MED ORDER — MIDAZOLAM HCL 2 MG/2ML IJ SOLN
INTRAMUSCULAR | Status: AC
  Filled 2012-05-19: qty 8

## 2012-05-19 NOTE — Progress Notes (Signed)
Rinaldo Cloud PA cancelled Cipro IV order as he is getting Zosyn IV. Will cancel in computer.

## 2012-05-19 NOTE — Progress Notes (Signed)
Patient tolerated bedrest well. Groin site unremarkable. Assisted patient up to restroom prior to discharge. Tolerated well. Patient and wife aware of all precautions and were able to repeat back precautions. Home with family at 1530.

## 2012-05-19 NOTE — Procedures (Signed)
Successful RT hepatic artery Y 90 radioembolization No comp Stable

## 2012-05-19 NOTE — Discharge Instructions (Signed)
° °  Post Y-90 Radioembolization Discharge Instructions ° °You have been given a radioactive material during your procedure.  While it is safe for you to be discharged home from the hospital, you need to proceed directly home.   ° °Do not use public transportation, including air travel, lasting more than 2 hours for 1 week. ° °Avoid crowded public places for 1 week. ° °Adult visitors should try to avoid close contact with you for 1 week.   ° °Children and pregnant females should not visit or have close contact with you for 1 week. ° °Items that you touch are not radioactive. ° °Do not sleep in the same bed as your partner for 1 week, and a condom should be used for sexual activity during the first 24 hours. ° °Your blood may be radioactive and caution should be used if any bleeding occurs during the recovery period. ° °Body fluids may be radioactive for 24 hours.  Wash your hands after voiding.  Men should sit to urinate.  Dispose of any soiled materials (flush down toilet or place in trash at home) during the first day. ° °Drink 6 to 8 glasses of fluids per day for 5 days to hydrate yourself. ° °If you need to see a doctor during the first week, you must let them know that you were treated with yttrium-90 microspheres, and will be slightly radioactive.  They can call Interventional Radiology [phone] with any questions. °

## 2012-05-19 NOTE — Progress Notes (Signed)
Patient and wife restated how to remove gauze dressing tomorrow from groin and replace with regular bandaid after shower. All d/c instructions reviewed and pt. Verbalized understanding.

## 2012-05-19 NOTE — H&P (Signed)
Tommy Conley is an 61 y.o. male.   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  Presents today for second stage of Y-90 procedure- placement of spheres.  HPI: Colon cancer stage IV, s/p GDA coil embolization with Rt hepatic artery MAA injection by Dr. Miles Costain on 05/05/12.  Presents today for second stage of procedure. Just seen by oncology - to follow up with him in 2 months for repeat labs post Y-90 procedure.    Past Medical History  Diagnosis Date  . Hypertension   . Barrett's esophagus   . GERD (gastroesophageal reflux disease)   . Colon polyp   . Cancer     colon ca with liver lesions  . Carcinoid tumor of cecum     Past Surgical History  Procedure Date  . Colonoscopy 07/2011  . Upper gastrointestinal endoscopy     hx barrett's esophagus  . Colectomy 09/29/11    lap - right with liver bx  . Ight colon/ileum resection 09/29/2011  . Wedge liver biopsy 09/29/11    right liver lesion    Family History  Problem Relation Age of Onset  . Colon polyps Neg Hx   . Colon cancer Neg Hx   . Stomach cancer Neg Hx   . Cancer Neg Hx    Social History:  reports that he quit smoking about 2 months ago. His smoking use included Cigarettes. He has a 4.25 pack-year smoking history. He has never used smokeless tobacco. He reports that he does not drink alcohol or use illicit drugs.  Allergies: No Known Allergies  Results for Tommy Conley, Tommy Conley (MRN [id number]) as of 05/19/2012 09:26  Ref. Range 05/19/2012 08:30  Sodium Latest Range: 135-145 mEq/L 136  Potassium Latest Range: 3.5-5.1 mEq/L 3.5  Chloride Latest Range: 96-112 mEq/L 102  CO2 Latest Range: 19-32 mEq/L 25  BUN Latest Range: 6-23 mg/dL 8  Creat Latest Range: 0.50-1.35 mg/dL 4.09  Calcium Latest Range: 8.4-10.5 mg/dL 9.1  GFR calc non Af Amer Latest Range: >90 mL/min >90  GFR calc Af Amer Latest Range: >90 mL/min >90  Glucose Latest Range: 70-99 mg/dL 97  Alkaline Phosphatase Latest  Range: 39-117 U/L 62  Albumin Latest Range: 3.3-5.5 g/dL 3.7  AST Latest Range: 0-37 U/L 19  ALT Latest Range: 0-53 U/L 14  Total Protein Latest Range: 6.0-8.3 g/dL 6.3  Total Bilirubin Latest Range: 0.3-1.2 mg/dL 0.4  WBC Latest Range: 4.0-10.5 K/uL 6.6  RBC Latest Range: 4.22-5.81 MIL/uL 4.22  Hemoglobin Latest Range: 13.0-17.0 g/dL 81.1 (L)  HCT Latest Range: 39.0-52.0 % 35.8 (L)  MCV Latest Range: 78.0-100.0 fL 84.8  MCH Latest Range: 26.0-34.0 pg 28.2  MCHC Latest Range: 30.0-36.0 g/dL 91.4  RDW Latest Range: 11.5-15.5 % 14.5  Platelets Latest Range: 150-400 K/uL 205  Prothrombin Time Latest Range: 11.6-15.2 seconds 13.3  INR Latest Range: 0.00-1.49  0.99  aPTT Latest Range: 24-37 seconds 29   Review of Systems  Constitutional: Negative for fever, chills, weight loss and malaise/fatigue.  HENT: Negative.   Eyes: Negative.   Respiratory: Negative.   Cardiovascular: Negative.   Gastrointestinal: Negative.   Musculoskeletal: Negative.   Skin: Negative.   Neurological: Negative.  Negative for weakness.  Psychiatric/Behavioral: Negative.     Blood pressure 130/77, pulse 65, temperature 97.3 F (36.3 C), temperature source Oral, resp. rate 16, height 6\' 1"  (1.854 m), weight 217 lb (98.431 kg), SpO2 98.00%. Physical Exam  Constitutional: He is oriented to person, place, and time. He appears  well-developed and well-nourished. No distress.  HENT:  Head: Normocephalic and atraumatic.  Eyes: Pupils are equal, round, and reactive to light.  Neck: Normal range of motion.  Cardiovascular: Normal rate, regular rhythm, normal heart sounds and intact distal pulses.  Exam reveals no gallop and no friction rub.   No murmur heard. Respiratory: Effort normal and breath sounds normal. No respiratory distress. He has no wheezes. He has no rales.  GI: Soft. He exhibits no distension. There is no tenderness.  Musculoskeletal: Normal range of motion. He exhibits no edema.  Neurological: He is  alert and oriented to person, place, and time.  Skin: Skin is warm and dry. He is not diaphoretic.  Psychiatric: He has a normal mood and affect. His behavior is normal. Judgment and thought content normal.     Assessment/Plan Patient underwent mesenteric angiogram with coil embolization of GDA and right hepatic artery MAA injection on 05/05/12.  Patient today will undergo repeat angiogram with placement of spheres for treatment of his metastatic hepatic lesion.  Patient tolerated the initial treatment and has no further questions today.  Again reviewed with patient risks and benefits as outlined in his consultation.  Labs are WNL to proceed. Written consent obtained.   Marlaine Arey D 05/19/2012, 9:25 AM

## 2012-05-24 ENCOUNTER — Other Ambulatory Visit: Payer: Self-pay | Admitting: Interventional Radiology

## 2012-05-24 DIAGNOSIS — C189 Malignant neoplasm of colon, unspecified: Secondary | ICD-10-CM

## 2012-06-09 ENCOUNTER — Ambulatory Visit (HOSPITAL_BASED_OUTPATIENT_CLINIC_OR_DEPARTMENT_OTHER): Payer: 59 | Admitting: Nurse Practitioner

## 2012-06-09 ENCOUNTER — Telehealth: Payer: Self-pay | Admitting: Oncology

## 2012-06-09 VITALS — BP 123/79 | HR 68 | Temp 97.1°F | Ht 73.0 in | Wt 219.4 lb

## 2012-06-09 DIAGNOSIS — C787 Secondary malignant neoplasm of liver and intrahepatic bile duct: Secondary | ICD-10-CM

## 2012-06-09 DIAGNOSIS — C7A021 Malignant carcinoid tumor of the cecum: Secondary | ICD-10-CM

## 2012-06-09 DIAGNOSIS — C7A012 Malignant carcinoid tumor of the ileum: Secondary | ICD-10-CM

## 2012-06-09 NOTE — Progress Notes (Signed)
OFFICE PROGRESS NOTE  Interval history:  [name] returns as scheduled. He underwent Y-90 radioembolization in interventional radiology on 05/19/2012. He had mild pain at the right abdomen for approximately one day. He noted mild weakness. He feels well at present. He denies fever. No flushing. He notes an overall decrease in diarrhea.   Objective: Blood pressure 123/79, pulse 68, temperature 97.1 F (36.2 C), temperature source Oral, height 6\' 1"  (1.854 m), weight 219 lb 6.4 oz (99.519 kg).  Tiny healing ulcer at the right lateral tongue. Lungs clear. Regular cardiac rhythm. Abdomen is soft and nontender. No hepatomegaly. Extremities are without edema.  Lab Results: Lab Results  Component Value Date   WBC 6.6 05/19/2012   HGB 11.9* 05/19/2012   HCT 35.8* 05/19/2012   MCV 84.8 05/19/2012   PLT 205 05/19/2012    Chemistry:    Chemistry      Component Value Date/Time   NA 136 05/19/2012 0830   NA 141 04/04/2012 1106   K 3.5 05/19/2012 0830   K 3.5 04/04/2012 1106   CL 102 05/19/2012 0830   CL 103 04/04/2012 1106   CO2 25 05/19/2012 0830   CO2 28 04/04/2012 1106   BUN 8 05/19/2012 0830   BUN 8 04/04/2012 1106   CREATININE 0.81 05/19/2012 0830   CREATININE 0.9 04/04/2012 1106      Component Value Date/Time   CALCIUM 9.1 05/19/2012 0830   CALCIUM 8.8 04/04/2012 1106   ALKPHOS 62 05/19/2012 0830   ALKPHOS 70 04/04/2012 1106   AST 19 05/19/2012 0830   AST 21 04/04/2012 1106   ALT 14 05/19/2012 0830   BILITOT 0.4 05/19/2012 0830   BILITOT 0.50 04/04/2012 1106       Studies/Results: Nm Liver Img Spect  05/19/2012  *RADIOLOGY REPORT*  Clinical Data:  Y - 90 microspheres therapy for hepatic metastasis.36 mCi Y 90 administered to right hepatic lobe.  NUCLEAR MEDICINE LIVER SCAN SPECT  Technique:  Abdominal images were obtained in multiple projections after intravenous injection of radiopharmaceutical.  Comparison:  MAA scan 05/05/12  Findings: Bremsstrahlung  planar and SPECT imaging of the  abdomen following  intrahepatic arterial delivery of Y-67microsphere demonstrates  radioactivity localized to the liver.  No evidence of extrahepatic activity.  IMPRESSION: Bremssstrahlung scan demonstrates activity localized to the liver with no extrahepatic activity identified.  Original Report Authenticated By: Genevive Bi, M.D.   Ir Angiogram Visceral Selective  05/19/2012  *RADIOLOGY REPORT*  Clinical Data: Metastatic colon carcinoma to the liver, nonoperative candidate with bilobar hepatic metastases.  ULTRASOUND GUIDANCE FOR VASCULAR ACCESS  COMMON HEPATIC AND RIGHT HEPATIC CATHETERIZATIONS AND ANGIOGRAMS  RIGHT HEPATIC ARTERY Y 90 RADIOEMBOLIZATION  FOLLOWUP ANGIOGRAM POST EMBOLIZATION  Date:  05/19/2012 09:30:00  Radiologist:  M. Ruel Favors, M.D.  Medications:  4 mg Versed, 200 mcg Fentanyl, 3.375 grams Zosyn administered within 1 hour of the procedure, 4 mg Zofran, 30 mg Toradol, 20 mg Decadron, 200 mcg Sandostatin, 40 mg Protonix  Guidance:  Ultrasound and fluoroscopic  Fluoroscopy time:  5.4 minutes  Sedation time:  1 hour 15 minutes  Contrast volume:  50 ml Omnipaque-300  Complications:  No immediate  PROCEDURE/FINDINGS:  Informed consent was obtained from the patient following explanation of the procedure, risks, benefits and alternatives. The patient understands, agrees and consents for the procedure. All questions were addressed.  A time out was performed.  Maximal barrier sterile technique utilized including caps, mask, sterile gowns, sterile gloves, large sterile drape, hand hygiene, and betadine  Under  sterile conditions and local anesthesia, right common femoral artery micropuncture access was performed with ultrasound.  Images obtained for documentation.  Guide wire advanced followed by 5- French sheath.  C2 catheter utilized to select the common hepatic artery.  Selective common hepatic angiogram performed.  Common hepatic artery:  Celiac origin, splenic, common hepatic, proper hepatic,  left and right hepatic vasculature remain patent. Complete coil embolization of the G D A noted.  Tumor vascularity present throughout the right hepatic vasculature.  Renegade high-flow micro catheter over a micro glide wire was advanced into the peripheral right hepatic artery.  Catheter access was advanced past the cystic artery.  Selective right hepatic angiogram performed.  Right hepatic artery:  Right hepatic artery remains widely patent. There is brisk antegrade flow.  Tumor vascularity present throughout the right hepatic territory.  Y 90 radioembolization:  From this location, the entire dose of Y 90 microspheres were delivered into the right hepatic vascular bed without difficulty.  No immediate complication.  Following embolization, followup angiogram confirms preserved patency of the right hepatic artery.  Y 90 radiotherapy dose:  35.68 mCi  Catheter and micro catheter removed safely.  Hemostasis obtained at the right C F A site with an Exoseal device.  No immediate complication.  The patient tolerated the procedure well.  IMPRESSION: Successful right hepatic artery Y 90 radioembolization.  Original Report Authenticated By: Judie Petit. Ruel Favors, M.D.   Ir Angiogram Selective Each Additional Vessel  05/19/2012  *RADIOLOGY REPORT*  Clinical Data: Metastatic colon carcinoma to the liver, nonoperative candidate with bilobar hepatic metastases.  ULTRASOUND GUIDANCE FOR VASCULAR ACCESS  COMMON HEPATIC AND RIGHT HEPATIC CATHETERIZATIONS AND ANGIOGRAMS  RIGHT HEPATIC ARTERY Y 90 RADIOEMBOLIZATION  FOLLOWUP ANGIOGRAM POST EMBOLIZATION  Date:  05/19/2012 09:30:00  Radiologist:  M. Ruel Favors, M.D.  Medications:  4 mg Versed, 200 mcg Fentanyl, 3.375 grams Zosyn administered within 1 hour of the procedure, 4 mg Zofran, 30 mg Toradol, 20 mg Decadron, 200 mcg Sandostatin, 40 mg Protonix  Guidance:  Ultrasound and fluoroscopic  Fluoroscopy time:  5.4 minutes  Sedation time:  1 hour 15 minutes  Contrast volume:  50 ml  Omnipaque-300  Complications:  No immediate  PROCEDURE/FINDINGS:  Informed consent was obtained from the patient following explanation of the procedure, risks, benefits and alternatives. The patient understands, agrees and consents for the procedure. All questions were addressed.  A time out was performed.  Maximal barrier sterile technique utilized including caps, mask, sterile gowns, sterile gloves, large sterile drape, hand hygiene, and betadine  Under sterile conditions and local anesthesia, right common femoral artery micropuncture access was performed with ultrasound.  Images obtained for documentation.  Guide wire advanced followed by 5- French sheath.  C2 catheter utilized to select the common hepatic artery.  Selective common hepatic angiogram performed.  Common hepatic artery:  Celiac origin, splenic, common hepatic, proper hepatic, left and right hepatic vasculature remain patent. Complete coil embolization of the G D A noted.  Tumor vascularity present throughout the right hepatic vasculature.  Renegade high-flow micro catheter over a micro glide wire was advanced into the peripheral right hepatic artery.  Catheter access was advanced past the cystic artery.  Selective right hepatic angiogram performed.  Right hepatic artery:  Right hepatic artery remains widely patent. There is brisk antegrade flow.  Tumor vascularity present throughout the right hepatic territory.  Y 90 radioembolization:  From this location, the entire dose of Y 90 microspheres were delivered into the right hepatic vascular bed without difficulty.  No immediate complication.  Following embolization, followup angiogram confirms preserved patency of the right hepatic artery.  Y 90 radiotherapy dose:  35.68 mCi  Catheter and micro catheter removed safely.  Hemostasis obtained at the right C F A site with an Exoseal device.  No immediate complication.  The patient tolerated the procedure well.  IMPRESSION: Successful right hepatic artery  Y 90 radioembolization.  Original Report Authenticated By: Judie Petit. Ruel Favors, M.D.   Ir Transcath/emboliz  05/19/2012  *RADIOLOGY REPORT*  Clinical Data: Metastatic colon carcinoma to the liver, nonoperative candidate with bilobar hepatic metastases.  ULTRASOUND GUIDANCE FOR VASCULAR ACCESS  COMMON HEPATIC AND RIGHT HEPATIC CATHETERIZATIONS AND ANGIOGRAMS  RIGHT HEPATIC ARTERY Y 90 RADIOEMBOLIZATION  FOLLOWUP ANGIOGRAM POST EMBOLIZATION  Date:  05/19/2012 09:30:00  Radiologist:  M. Ruel Favors, M.D.  Medications:  4 mg Versed, 200 mcg Fentanyl, 3.375 grams Zosyn administered within 1 hour of the procedure, 4 mg Zofran, 30 mg Toradol, 20 mg Decadron, 200 mcg Sandostatin, 40 mg Protonix  Guidance:  Ultrasound and fluoroscopic  Fluoroscopy time:  5.4 minutes  Sedation time:  1 hour 15 minutes  Contrast volume:  50 ml Omnipaque-300  Complications:  No immediate  PROCEDURE/FINDINGS:  Informed consent was obtained from the patient following explanation of the procedure, risks, benefits and alternatives. The patient understands, agrees and consents for the procedure. All questions were addressed.  A time out was performed.  Maximal barrier sterile technique utilized including caps, mask, sterile gowns, sterile gloves, large sterile drape, hand hygiene, and betadine  Under sterile conditions and local anesthesia, right common femoral artery micropuncture access was performed with ultrasound.  Images obtained for documentation.  Guide wire advanced followed by 5- French sheath.  C2 catheter utilized to select the common hepatic artery.  Selective common hepatic angiogram performed.  Common hepatic artery:  Celiac origin, splenic, common hepatic, proper hepatic, left and right hepatic vasculature remain patent. Complete coil embolization of the G D A noted.  Tumor vascularity present throughout the right hepatic vasculature.  Renegade high-flow micro catheter over a micro glide wire was advanced into the peripheral right  hepatic artery.  Catheter access was advanced past the cystic artery.  Selective right hepatic angiogram performed.  Right hepatic artery:  Right hepatic artery remains widely patent. There is brisk antegrade flow.  Tumor vascularity present throughout the right hepatic territory.  Y 90 radioembolization:  From this location, the entire dose of Y 90 microspheres were delivered into the right hepatic vascular bed without difficulty.  No immediate complication.  Following embolization, followup angiogram confirms preserved patency of the right hepatic artery.  Y 90 radiotherapy dose:  35.68 mCi  Catheter and micro catheter removed safely.  Hemostasis obtained at the right C F A site with an Exoseal device.  No immediate complication.  The patient tolerated the procedure well.  IMPRESSION: Successful right hepatic artery Y 90 radioembolization.  Original Report Authenticated By: Judie Petit. Ruel Favors, M.D.   Ir Angiogram Follow Up Study  05/19/2012  *RADIOLOGY REPORT*  Clinical Data: Metastatic colon carcinoma to the liver, nonoperative candidate with bilobar hepatic metastases.  ULTRASOUND GUIDANCE FOR VASCULAR ACCESS  COMMON HEPATIC AND RIGHT HEPATIC CATHETERIZATIONS AND ANGIOGRAMS  RIGHT HEPATIC ARTERY Y 90 RADIOEMBOLIZATION  FOLLOWUP ANGIOGRAM POST EMBOLIZATION  Date:  05/19/2012 09:30:00  Radiologist:  M. Ruel Favors, M.D.  Medications:  4 mg Versed, 200 mcg Fentanyl, 3.375 grams Zosyn administered within 1 hour of the procedure, 4 mg Zofran, 30 mg Toradol, 20 mg Decadron, 200  mcg Sandostatin, 40 mg Protonix  Guidance:  Ultrasound and fluoroscopic  Fluoroscopy time:  5.4 minutes  Sedation time:  1 hour 15 minutes  Contrast volume:  50 ml Omnipaque-300  Complications:  No immediate  PROCEDURE/FINDINGS:  Informed consent was obtained from the patient following explanation of the procedure, risks, benefits and alternatives. The patient understands, agrees and consents for the procedure. All questions were addressed.  A  time out was performed.  Maximal barrier sterile technique utilized including caps, mask, sterile gowns, sterile gloves, large sterile drape, hand hygiene, and betadine  Under sterile conditions and local anesthesia, right common femoral artery micropuncture access was performed with ultrasound.  Images obtained for documentation.  Guide wire advanced followed by 5- French sheath.  C2 catheter utilized to select the common hepatic artery.  Selective common hepatic angiogram performed.  Common hepatic artery:  Celiac origin, splenic, common hepatic, proper hepatic, left and right hepatic vasculature remain patent. Complete coil embolization of the G D A noted.  Tumor vascularity present throughout the right hepatic vasculature.  Renegade high-flow micro catheter over a micro glide wire was advanced into the peripheral right hepatic artery.  Catheter access was advanced past the cystic artery.  Selective right hepatic angiogram performed.  Right hepatic artery:  Right hepatic artery remains widely patent. There is brisk antegrade flow.  Tumor vascularity present throughout the right hepatic territory.  Y 90 radioembolization:  From this location, the entire dose of Y 90 microspheres were delivered into the right hepatic vascular bed without difficulty.  No immediate complication.  Following embolization, followup angiogram confirms preserved patency of the right hepatic artery.  Y 90 radiotherapy dose:  35.68 mCi  Catheter and micro catheter removed safely.  Hemostasis obtained at the right C F A site with an Exoseal device.  No immediate complication.  The patient tolerated the procedure well.  IMPRESSION: Successful right hepatic artery Y 90 radioembolization.  Original Report Authenticated By: Judie Petit. Ruel Favors, M.D.   Nm Special Med Rad Physics Cons  05/19/2012  *RADIOLOGY REPORT*  Clinical Data: Unresectable neuroendocrine metastasis to the liver. Treatment of right hepatic lobe.  Exam: NM SPECIAL TREATMENT  PROCEDURE,NM SPECIAL MED RAD PHYSICS CONS,NM RADIO PHARM THERAPY INTRAARTERIAL  Technique:  In  conjunction with the interventional radiologist a Y - Microsphere dose was calculated utilizing body surface area formulation.  Calculated dose equal 35.8 mCi.  Pre therapy MAA liver SPECT scan and CTA were evaluated.  Utilizing a microcatheter system, the hepatic artery was selected and  Y-90 microspheres were delivered in fractionated aliquots.  Radiopharmaceutical was delivered by the interventional radiologist and  nuclear radiologist.  The patient tolerated procedure well.  No adverse effects were noted.  Bremsstrahlung scan to follow.  Comparison:CT 04/21/2012, in a scan 05/05/2012  Radiopharmaceutical:36.6 mCi Y -90 microspheres.  Findings: Y - 90 microspheres therapy as above.  IMPRESSION: Successful Y - 90 microsphere delivery for treatment of unresectable liver metastasis.  Original Report Authenticated By: Genevive Bi, M.D.   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 Treatment of right hepatic lobe.  Exam: NM SPECIAL TREATMENT PROCEDURE,NM SPECIAL MED RAD PHYSICS CONS,NM RADIO PHARM THERAPY INTRAARTERIAL  Technique:  In  conjunction with the interventional radiologist a Y - Microsphere dose was calculated utilizing body surface area formulation.  Calculated dose equal 35.8 mCi.  Pre therapy MAA liver SPECT scan and CTA were evaluated.  Utilizing a microcatheter system, the hepatic artery was  selected and  Y-90 microspheres were delivered in fractionated aliquots.  Radiopharmaceutical was delivered by the interventional radiologist and  nuclear radiologist.  The patient tolerated procedure well.  No adverse effects were noted.  Bremsstrahlung scan to follow.  Comparison:CT 04/21/2012, in a scan 05/05/2012  Radiopharmaceutical:36.6 mCi Y -90 microspheres.  Findings: Y - 90 microspheres therapy as above.  IMPRESSION:  Successful Y - 90 microsphere delivery for treatment of unresectable liver metastasis.  Original Report Authenticated By: Genevive Bi, M.D.   Ir US Guide Vasc Access Right  05/19/2012  *RADIOLOGY REPORT*  Clinical Data: Metastatic colon carcinoma to the liver, nonoperative candidate with bilobar hepatic metastases.  ULTRASOUND GUIDANCE FOR VASCULAR ACCESS  COMMON HEPATIC AND RIGHT HEPATIC CATHETERIZATIONS AND ANGIOGRAMS  RIGHT HEPATIC ARTERY Y 90 RADIOEMBOLIZATION  FOLLOWUP ANGIOGRAM POST EMBOLIZATION  Date:  05/19/2012 09:30:00  Radiologist:  M. Ruel Favors, M.D.  Medications:  4 mg Versed, 200 mcg Fentanyl, 3.375 grams Zosyn administered within 1 hour of the procedure, 4 mg Zofran, 30 mg Toradol, 20 mg Decadron, 200 mcg Sandostatin, 40 mg Protonix  Guidance:  Ultrasound and fluoroscopic  Fluoroscopy time:  5.4 minutes  Sedation time:  1 hour 15 minutes  Contrast volume:  50 ml Omnipaque-300  Complications:  No immediate  PROCEDURE/FINDINGS:  Informed consent was obtained from the patient following explanation of the procedure, risks, benefits and alternatives. The patient understands, agrees and consents for the procedure. All questions were addressed.  A time out was performed.  Maximal barrier sterile technique utilized including caps, mask, sterile gowns, sterile gloves, large sterile drape, hand hygiene, and betadine  Under sterile conditions and local anesthesia, right common femoral artery micropuncture access was performed with ultrasound.  Images obtained for documentation.  Guide wire advanced followed by 5- French sheath.  C2 catheter utilized to select the common hepatic artery.  Selective common hepatic angiogram performed.  Common hepatic artery:  Celiac origin, splenic, common hepatic, proper hepatic, left and right hepatic vasculature remain patent. Complete coil embolization of the G D A noted.  Tumor vascularity present throughout the right hepatic vasculature.  Renegade high-flow micro  catheter over a micro glide wire was advanced into the peripheral right hepatic artery.  Catheter access was advanced past the cystic artery.  Selective right hepatic angiogram performed.  Right hepatic artery:  Right hepatic artery remains widely patent. There is brisk antegrade flow.  Tumor vascularity present throughout the right hepatic territory.  Y 90 radioembolization:  From this location, the entire dose of Y 90 microspheres were delivered into the right hepatic vascular bed without difficulty.  No immediate complication.  Following embolization, followup angiogram confirms preserved patency of the right hepatic artery.  Y 90 radiotherapy dose:  35.68 mCi  Catheter and micro catheter removed safely.  Hemostasis obtained at the right C F A site with an Exoseal device.  No immediate complication.  The patient tolerated the procedure well.  IMPRESSION: Successful right hepatic artery Y 90 radioembolization.  Original Report Authenticated By: Judie Petit. Ruel Favors, M.D.   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 Treatment of right hepatic lobe.  Exam: NM SPECIAL TREATMENT PROCEDURE,NM SPECIAL MED RAD PHYSICS CONS,NM RADIO PHARM THERAPY INTRAARTERIAL  Technique:  In  conjunction with the interventional radiologist a Y - Microsphere dose was calculated utilizing body surface area formulation.  Calculated dose equal 35.8 mCi.  Pre therapy MAA liver SPECT scan and CTA were evaluated.  Utilizing  a microcatheter system, the hepatic artery was selected and  Y-90 microspheres were delivered in fractionated aliquots.  Radiopharmaceutical was delivered by the interventional radiologist and  nuclear radiologist.  The patient tolerated procedure well.  No adverse effects were noted.  Bremsstrahlung scan to follow.  Comparison:CT 04/21/2012, in a scan 05/05/2012  Radiopharmaceutical:36.6 mCi Y -90 microspheres.  Findings: Y - 90  microspheres therapy as above.  IMPRESSION: Successful Y - 90 microsphere delivery for treatment of unresectable liver metastasis.  Original Report Authenticated By: Genevive Bi, M.D.    Medications: I have reviewed the patient's current medications.  Assessment/Plan:  1. 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  a. Status post a right colon/ileum resection on 09/29/2011.  b. Wedge biopsy of a right liver lesion on 09/29/2011 confirmed metastatic well-differentiated neuroendocrine carcinoma.  c. CT of the abdomen 08/25/2011 consistent with multiple liver metastases.  d. Elevated preoperative chromogranin A level and a 24-hour urine 5-HIAA level. Stable on repeat 11/16/2011. e. Restaging CT on 12/28/2011 confirmed slight progression of the metastatic liver lesions. f. Status post 3 cycles of Xeloda/temozolomide with cycle 3 initiated on 03/12/2012 g. Restaging CT of the abdomen 04/04/2012 revealed slight progression of the metastatic liver lesions and transverse mesocolon adenopathy. h. Status post Y-90 radioembolization procedure on 05/19/2012. 2. History of diarrhea (3 to 4 times per day), predating surgery for 8 months. Recent improvement. 3. Hypertension. 4. History of gastroesophageal reflux disease. 5. Rectal and ascending colon polyps. 6. Iron deficiency anemia-improved. 7. Small tongue ulcer-? Related to chemotherapy. He was given a prescription for Magic mouthwash.  Disposition-[name] appears stable. We will obtain a repeat chromogranin A level on 07/11/2012. He will return for a followup visit on 07/14/2012. He will contact the office in the interim with any problems.  Plan reviewed with Dr. Derenda Fennel, Misty Stanley ANP/GNP-BC

## 2012-06-09 NOTE — Telephone Encounter (Signed)
Gv pt appt for [id number] °

## 2012-06-14 ENCOUNTER — Other Ambulatory Visit (HOSPITAL_COMMUNITY): Payer: Self-pay | Admitting: Interventional Radiology

## 2012-06-14 ENCOUNTER — Ambulatory Visit
Admission: RE | Admit: 2012-06-14 | Discharge: 2012-06-14 | Disposition: A | Discharge status: Home / Self Care | Payer: 59 | Source: Ambulatory Visit | Attending: Interventional Radiology | Admitting: Interventional Radiology

## 2012-06-14 DIAGNOSIS — C787 Secondary malignant neoplasm of liver and intrahepatic bile duct: Secondary | ICD-10-CM

## 2012-06-14 DIAGNOSIS — C7B02 Secondary carcinoid tumors of liver: Secondary | ICD-10-CM

## 2012-06-14 DIAGNOSIS — D3A021 Benign carcinoid tumor of the cecum: Secondary | ICD-10-CM

## 2012-06-14 NOTE — Progress Notes (Signed)
3WS POST Y-90 TREATMENT///PT DENIES ANY SYMPTOMS!!

## 2012-06-21 ENCOUNTER — Other Ambulatory Visit: Payer: Self-pay | Admitting: Radiology

## 2012-06-24 LAB — CBC
HCT: 39.5 % (ref 39.0–52.0)
Hemoglobin: 13.2 g/dL (ref 13.0–17.0)
MCH: 27.6 pg (ref 26.0–34.0)
MCHC: 33.4 g/dL (ref 30.0–36.0)
MCV: 82.6 fL (ref 78.0–100.0)

## 2012-06-24 LAB — APTT: aPTT: 29 seconds (ref 24–37)

## 2012-06-24 LAB — COMPREHENSIVE METABOLIC PANEL
BUN: 10 mg/dL (ref 6–23)
CO2: 25 mEq/L (ref 19–32)
Chloride: 106 mEq/L (ref 96–112)
Creatinine, Ser: 0.79 mg/dL (ref 0.50–1.35)
GFR calc non Af Amer: >90 mL/min (ref >90)
Glucose, Bld: 115 mg/dL — ABNORMAL HIGH (ref 70–99)
Total Bilirubin: 0.3 mg/dL (ref 0.3–1.2)

## 2012-06-24 LAB — PROTIME-INR
INR: 0.95 (ref 0.00–1.49)
Prothrombin Time: 12.9 seconds (ref 11.6–15.2)

## 2012-07-01 ENCOUNTER — Encounter (HOSPITAL_COMMUNITY): Payer: Self-pay | Admitting: Pharmacy Technician

## 2012-07-01 ENCOUNTER — Other Ambulatory Visit: Payer: Self-pay | Admitting: Radiology

## 2012-07-06 ENCOUNTER — Other Ambulatory Visit (HOSPITAL_COMMUNITY): Payer: Self-pay | Admitting: Interventional Radiology

## 2012-07-06 ENCOUNTER — Encounter (HOSPITAL_COMMUNITY): Payer: Self-pay

## 2012-07-06 ENCOUNTER — Ambulatory Visit (HOSPITAL_COMMUNITY)
Admission: RE | Admit: 2012-07-06 | Discharge: 2012-07-06 | Disposition: A | Discharge status: Home / Self Care | Payer: 59 | Source: Ambulatory Visit | Attending: Interventional Radiology | Admitting: Interventional Radiology

## 2012-07-06 ENCOUNTER — Telehealth: Payer: Self-pay | Admitting: *Deleted

## 2012-07-06 DIAGNOSIS — C7B02 Secondary carcinoid tumors of liver: Secondary | ICD-10-CM

## 2012-07-06 DIAGNOSIS — C7A021 Malignant carcinoid tumor of the cecum: Secondary | ICD-10-CM | POA: Insufficient documentation

## 2012-07-06 DIAGNOSIS — C787 Secondary malignant neoplasm of liver and intrahepatic bile duct: Secondary | ICD-10-CM | POA: Insufficient documentation

## 2012-07-06 DIAGNOSIS — D3A021 Benign carcinoid tumor of the cecum: Secondary | ICD-10-CM

## 2012-07-06 LAB — CBC
HCT: 39 % (ref 39.0–52.0)
MCHC: 32.6 g/dL (ref 30.0–36.0)
RDW: 13.9 % (ref 11.5–15.5)

## 2012-07-06 LAB — PROTIME-INR
INR: 1.02 (ref 0.00–1.49)
Prothrombin Time: 13.6 seconds (ref 11.6–15.2)

## 2012-07-06 LAB — COMPREHENSIVE METABOLIC PANEL
Albumin: 3.9 g/dL (ref 3.5–5.2)
Alkaline Phosphatase: 66 U/L (ref 39–117)
BUN: 11 mg/dL (ref 6–23)
Potassium: 3.3 mEq/L — ABNORMAL LOW (ref 3.5–5.1)
Total Protein: 6.7 g/dL (ref 6.0–8.3)

## 2012-07-06 LAB — APTT: aPTT: 29 seconds (ref 24–37)

## 2012-07-06 MED ORDER — KETOROLAC TROMETHAMINE 30 MG/ML IJ SOLN
INTRAMUSCULAR | Status: AC
  Filled 2012-07-06: qty 1

## 2012-07-06 MED ORDER — KETOROLAC TROMETHAMINE 30 MG/ML IJ SOLN
30.0000 mg | Freq: Once | INTRAMUSCULAR | Status: AC
  Administered 2012-07-06: 30 mg via INTRAVENOUS

## 2012-07-06 MED ORDER — FENTANYL CITRATE 0.05 MG/ML IJ SOLN
INTRAMUSCULAR | Status: AC
  Filled 2012-07-06: qty 6

## 2012-07-06 MED ORDER — IOHEXOL 300 MG/ML  SOLN: 40.0000 mL | Freq: Once | INTRAMUSCULAR | Status: DC | PRN

## 2012-07-06 MED ORDER — ONDANSETRON HCL 4 MG/2ML IJ SOLN
INTRAMUSCULAR | Status: AC
  Filled 2012-07-06: qty 2

## 2012-07-06 MED ORDER — SODIUM CHLORIDE 0.9 % IV SOLN
INTRAVENOUS | Status: DC
  Administered 2012-07-06 (×2): via INTRAVENOUS

## 2012-07-06 MED ORDER — OCTREOTIDE ACETATE 100 MCG/ML IJ SOLN
200.0000 ug | Freq: Once | INTRAMUSCULAR | Status: AC
  Administered 2012-07-06: 200 ug via INTRAVENOUS
  Filled 2012-07-06: qty 2

## 2012-07-06 MED ORDER — PIPERACILLIN-TAZOBACTAM 3.375 G IVPB
3.3750 g | Freq: Once | INTRAVENOUS | Status: AC
  Administered 2012-07-06: 3.375 g via INTRAVENOUS
  Filled 2012-07-06: qty 50

## 2012-07-06 MED ORDER — FENTANYL CITRATE 0.05 MG/ML IJ SOLN
INTRAMUSCULAR | Status: AC
  Filled 2012-07-06: qty 2

## 2012-07-06 MED ORDER — DEXAMETHASONE SODIUM PHOSPHATE 10 MG/ML IJ SOLN
20.0000 mg | Freq: Once | INTRAMUSCULAR | Status: AC
  Administered 2012-07-06: 20 mg via INTRAVENOUS

## 2012-07-06 MED ORDER — LIDOCAINE HCL 1 % IJ SOLN
INTRAMUSCULAR | Status: AC
  Filled 2012-07-06: qty 20

## 2012-07-06 MED ORDER — PANTOPRAZOLE SODIUM 40 MG IV SOLR
INTRAVENOUS | Status: AC
  Filled 2012-07-06: qty 40

## 2012-07-06 MED ORDER — MIDAZOLAM HCL 2 MG/2ML IJ SOLN
INTRAMUSCULAR | Status: AC
  Filled 2012-07-06: qty 6

## 2012-07-06 MED ORDER — FENTANYL CITRATE 0.05 MG/ML IJ SOLN
INTRAMUSCULAR | Status: AC
  Filled 2012-07-06: qty 4

## 2012-07-06 MED ORDER — ONDANSETRON HCL 4 MG/2ML IJ SOLN
4.0000 mg | Freq: Once | INTRAMUSCULAR | Status: AC
  Administered 2012-07-06: 4 mg via INTRAVENOUS

## 2012-07-06 MED ORDER — POTASSIUM CHLORIDE CRYS ER 20 MEQ PO TBCR
40.0000 meq | EXTENDED_RELEASE_TABLET | Freq: Once | ORAL | Status: AC
  Administered 2012-07-06: 40 meq via ORAL
  Filled 2012-07-06: qty 2

## 2012-07-06 MED ORDER — MIDAZOLAM HCL 5 MG/5ML IJ SOLN
INTRAMUSCULAR | Status: AC | PRN
  Administered 2012-07-06 (×4): 1 mg via INTRAVENOUS

## 2012-07-06 MED ORDER — FENTANYL CITRATE 0.05 MG/ML IJ SOLN
INTRAMUSCULAR | Status: AC | PRN
  Administered 2012-07-06 (×2): 100 ug via INTRAVENOUS

## 2012-07-06 MED ORDER — DEXAMETHASONE SODIUM PHOSPHATE 10 MG/ML IJ SOLN
INTRAMUSCULAR | Status: AC
  Filled 2012-07-06: qty 2

## 2012-07-06 MED ORDER — YTTRIUM 90 INJECTION: 26.3000 | INJECTION | Freq: Once | INTRAVENOUS | Status: DC

## 2012-07-06 MED ORDER — PANTOPRAZOLE SODIUM 40 MG IV SOLR
40.0000 mg | Freq: Once | INTRAVENOUS | Status: AC
  Administered 2012-07-06: 40 mg via INTRAVENOUS

## 2012-07-06 NOTE — ED Notes (Signed)
Bedrest begins.

## 2012-07-06 NOTE — ED Notes (Signed)
Pt transported to [facility] for post procedure imaging via bed with RN.

## 2012-07-06 NOTE — Telephone Encounter (Signed)
Call from pt asking if he should keep appts for lab and MD next week. Y90 procedure was done 07/06/12. 7/22 lab for 7/25 visit was scheduled before they knew procedure was being moved up. Will review with Dr. Truett Perna.

## 2012-07-06 NOTE — H&P (Signed)
Tommy Conley is an 61 y.o. male.        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 Presents today for  Y-90 procedure- placement of spheres to treat left hepatic lobe lesion which would complete his entire treatment regimen to his liver.  Tommy Conley  HPI: Colon cancer stage IV, s/p GDA coil embolization with Rt hepatic artery MAA injection by Dr. Miles Costain on 05/05/12. He was seen in follow up one month post procedure - see follow up note below :  *RADIOLOGY REPORT*   ESTABLISHED PATIENT OFFICE VISIT - LEVEL II ([id number])   Chief Complaint:  Metastatic carcinoid tumor of the cecum to the liver.  Status post right hepatic Y 90 radioembolization 1 month ago.  Routine outpatient follow-up post-treatment.   History:  61 year old male with metastatic carcinoid tumor to the liver.  He presented with diarrhea and headache colonoscopy in August 2012 demonstrating a cecal mass.  Pathology demonstrated well differentiated carcinoid tumor.  He underwent right colectomy and liver biopsy.  Imaging confirmed stage IV disease with metastatic carcinoid tumor involving the entire liver.  The patient is a nonoperative candidate.  He underwent successful right hepatic Y 90 radioembolization 05/19/2012.  Over the last month he has recovered very well.  Diarrhea has resolved.  Stable weight.  No current symptoms or abdominal pain.   E C O G status 0.  He continues to work full time.   Exam:  Afebrile, vital signs stable.   Abdomen:  Nontender, nondistended.  No organomegaly.  No flank pain or tenderness.  Normal femoral and pedal pulses.   Today, I spent approximately 20 minutes with the patient and reviewed the previous Y 90 treatment and plans for a second left hepatic Y90 treatment in the next few weeks.  He does wish to proceed with a second treatment to the left hepatic lobe.   Assessment and Plan:  Metastatic carcinoid tumor to the liver. Previous mixed  response to chemotherapy.  9-month status post right hepatic Y 90 embolization.  He has recovered very well.  Currently he is asymptomatic.   Plan:  Scheduled for second treatment to the left hepatic lobe to complete a whole treatment regimen to liver.  Findings discussed with Dr. Truett Perna who is in agreement.   Original Report Authenticated By: Judie Petit. Ruel Favors, M.D.            Past Medical History  Diagnosis Date  . Hypertension   . Barrett's esophagus   . GERD (gastroesophageal reflux disease)   . Colon polyp   . Cancer     colon ca with liver lesions  . Carcinoid tumor of cecum     Past Surgical History  Procedure Date  . Colonoscopy 07/2011  . Upper gastrointestinal endoscopy     hx barrett's esophagus  . Colectomy 09/29/11    lap - right with liver bx  . Ight colon/ileum resection 09/29/2011  . Wedge liver biopsy 09/29/11    right liver lesion    Family History  Problem Relation Age of Onset  . Colon polyps Neg Hx   . Colon cancer Neg Hx   . Stomach cancer Neg Hx   . Cancer Neg Hx    Social History:  reports that he quit smoking about 4 months ago. His smoking use included Cigarettes. He has a 4.25 pack-year smoking history. He has never used smokeless tobacco. He reports that he does not drink alcohol or use illicit drugs.  Allergies:  No Known Allergies   Results for orders placed during the hospital encounter of 07/06/12 (from the past 48 hour(s))  CBC     Status: Abnormal   Collection Time   07/06/12  8:30 AM      Component Value Range Comment   WBC 4.4  4.0 - 10.5 K/uL    RBC 4.68  4.22 - 5.81 MIL/uL    Hemoglobin 12.7 (*) 13.0 - 17.0 g/dL    HCT 16.1  09.6 - 04.5 %    MCV 83.3  78.0 - 100.0 fL    MCH 27.1  26.0 - 34.0 pg    MCHC 32.6  30.0 - 36.0 g/dL    RDW 40.9  81.1 - 91.4 %    Platelets 197  150 - 400 K/uL   COMPREHENSIVE METABOLIC PANEL     Status: Abnormal   Collection Time   07/06/12  8:30 AM      Component Value Range Comment   Sodium 138   135 - 145 mEq/L    Potassium 3.3 (*) 3.5 - 5.1 mEq/L    Chloride 103  96 - 112 mEq/L    CO2 26  19 - 32 mEq/L    Glucose, Bld 88  70 - 99 mg/dL    BUN 11  6 - 23 mg/dL    Creatinine, Ser 7.82  0.50 - 1.35 mg/dL    Calcium 9.6  8.4 - 95.6 mg/dL    Total Protein 6.7  6.0 - 8.3 g/dL    Albumin 3.9  3.5 - 5.2 g/dL    AST 20  0 - 37 U/L    ALT 18  0 - 53 U/L    Alkaline Phosphatase 66  39 - 117 U/L    Total Bilirubin 0.4  0.3 - 1.2 mg/dL    GFR calc non Af Amer 88 (*) >90 mL/min    GFR calc Af Amer >90  >90 mL/min   PROTIME-INR     Status: Normal   Collection Time   07/06/12  8:30 AM      Component Value Range Comment   Prothrombin Time 13.6  11.6 - 15.2 seconds    INR 1.02  0.00 - 1.49   APTT     Status: Normal   Collection Time   07/06/12  8:30 AM      Component Value Range Comment   aPTT 29  24 - 37 seconds     Review of Systems  Constitutional: Negative for fever, chills, weight loss and malaise/fatigue.  Respiratory: Negative for cough, hemoptysis and shortness of breath.   Cardiovascular: Negative for chest pain and palpitations.  Gastrointestinal: Negative for nausea, vomiting, abdominal pain, diarrhea, constipation, blood in stool and melena.  Genitourinary: Negative.   Musculoskeletal: Negative.   Neurological: Negative.   Psychiatric/Behavioral: Negative.     Blood pressure 108/81, pulse 80, temperature 97.1 F (36.2 C), temperature source Oral, resp. rate 18, height 6\' 1"  (1.854 m), weight 219 lb (99.338 kg), SpO2 100.00%. Physical Exam  Constitutional: He is oriented to person, place, and time. He appears well-developed and well-nourished. No distress.  HENT:  Head: Normocephalic and atraumatic.  Eyes: Pupils are equal, round, and reactive to light.  Neck: Normal range of motion.  Cardiovascular: Normal rate, regular rhythm, normal heart sounds and intact distal pulses.  Exam reveals no gallop and no friction rub.   No murmur heard. Respiratory: Effort normal  and breath sounds normal. No respiratory distress. He has no wheezes.  He has no rales.  GI: Soft. Bowel sounds are normal. He exhibits no distension. There is no tenderness.  Musculoskeletal: Normal range of motion. He exhibits no edema.  Neurological: He is alert and oriented to person, place, and time.  Skin: Skin is warm and dry.  Psychiatric: He has a normal mood and affect. His behavior is normal. Judgment and thought content normal.     Assessment/Plan Procedure details reviewed again with patient and spouse with no additional questions in regards to treatment of his left hepatic lesion.  He tolerated the last y-90 procedure without complication.  Labs WNL to proceed today with treatment of his left hepatic lesion which would complete his treatment regimen with IR.   CAMPBELL,PAMELA D 07/06/2012, 9:17 AM

## 2012-07-06 NOTE — ED Notes (Signed)
5FR Sheath removed by Dr. Miles Costain.  Artery sealed using exoseal closure device.  R Groin level 0, 3+RDP.  Manual pressure applied X 10 mins.  Gauze/Tegaderm dressing applied.

## 2012-07-06 NOTE — Procedures (Signed)
Successful LEFT HEPATIC ARTERY Y90 RADIOEMBOLIZATION NO COMP  STABLE FULL REPORT IN PACS

## 2012-07-07 ENCOUNTER — Telehealth: Payer: Self-pay | Admitting: Oncology

## 2012-07-07 NOTE — Telephone Encounter (Signed)
lmonvm adviisng the pt of his r/s July appts to [facility]

## 2012-07-07 NOTE — Telephone Encounter (Signed)
Per Dr. Truett Perna: reschedule lab and office visit 2-3 weeks out. Pt understands to expect call from schedulers.

## 2012-07-11 ENCOUNTER — Other Ambulatory Visit: Payer: 59 | Admitting: Lab

## 2012-07-14 ENCOUNTER — Ambulatory Visit: Payer: 59 | Admitting: Oncology

## 2012-07-15 ENCOUNTER — Encounter: Payer: Self-pay | Admitting: Oncology

## 2012-07-15 NOTE — Progress Notes (Signed)
Faxed fmla papers to [facility] @ [phone].

## 2012-07-15 NOTE — Progress Notes (Signed)
Prior note is not correct:::: I put the fmla papers on nurse's desk.

## 2012-07-19 ENCOUNTER — Encounter: Payer: Self-pay | Admitting: Oncology

## 2012-07-19 NOTE — Progress Notes (Signed)
Faxed fmla papers to [facility] @ [phone].

## 2012-07-27 ENCOUNTER — Other Ambulatory Visit (HOSPITAL_BASED_OUTPATIENT_CLINIC_OR_DEPARTMENT_OTHER): Payer: 59 | Admitting: Lab

## 2012-07-27 DIAGNOSIS — C7A021 Malignant carcinoid tumor of the cecum: Secondary | ICD-10-CM

## 2012-08-01 ENCOUNTER — Ambulatory Visit (HOSPITAL_BASED_OUTPATIENT_CLINIC_OR_DEPARTMENT_OTHER): Payer: 59 | Admitting: Oncology

## 2012-08-01 ENCOUNTER — Telehealth: Payer: Self-pay | Admitting: Oncology

## 2012-08-01 VITALS — BP 130/81 | HR 99 | Temp 98.4°F | Resp 18 | Ht 73.0 in | Wt 215.6 lb

## 2012-08-01 DIAGNOSIS — C7A021 Malignant carcinoid tumor of the cecum: Secondary | ICD-10-CM

## 2012-08-01 DIAGNOSIS — C7A012 Malignant carcinoid tumor of the ileum: Secondary | ICD-10-CM

## 2012-08-01 DIAGNOSIS — C787 Secondary malignant neoplasm of liver and intrahepatic bile duct: Secondary | ICD-10-CM

## 2012-08-01 NOTE — Progress Notes (Signed)
   [facility]    OFFICE PROGRESS NOTE   INTERVAL HISTORY:   He returns as scheduled. He underwent Y-90 treatment of the left hepatic lobe on 07/06/2012. He reports tolerating the treatment well. He has approximately 2 bowel movements per day. No fever or diarrhea. He had malaise for one day after the most recent procedure. No other complaint. He did not stop Nexium prior to the most recent chromogranin A draw. Objective:  Vital signs in last 24 hours:  Blood pressure 130/81, pulse 99, temperature 98.4 F (36.9 C), temperature source Oral, resp. rate 18, height 6\' 1"  (1.854 m), weight 215 lb 9.6 oz (97.796 kg).   Resp: Lungs clear bilaterally Cardio: Regular rate and rhythm GI: No hepatosplenomegaly, nontender Vascular: No leg edema   Lab Results:  Chromogranin A level from 07/27/2012-pending   Medications: I have reviewed the patient's current medications.  Assessment/Plan: 1. 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  a. Status post a right colon/ileum resection on 09/29/2011.  b. Wedge biopsy of a right liver lesion on 09/29/2011 confirmed metastatic well-differentiated neuroendocrine carcinoma.  c. CT of the abdomen 08/25/2011 consistent with multiple liver metastases.  d. Elevated preoperative chromogranin A level and a 24-hour urine 5-HIAA level. Stable on repeat 11/16/2011. e. Restaging CT on 12/28/2011 confirmed slight progression of the metastatic liver lesions. f. Status post 3 cycles of Xeloda/temozolomide with cycle 3 initiated on 03/12/2012 g. Restaging CT of the abdomen 04/04/2012 revealed slight progression of the metastatic liver lesions and transverse mesocolon adenopathy. h. Status post Y-90 radioembolization of the right liver on 05/19/2012. i. Status post Y-90 radioembolization of the left liver on 07/06/2012 2. History of diarrhea (3 to 4 times per day),  predating surgery for 8 months. Improved 3. Hypertension. 4. History of gastroesophageal reflux disease. 5. Rectal and ascending colon polyps. 6. Iron deficiency anemia-improved.  Disposition:  He appears well. He has undergone radioembolization to both the right and left liver. We will followup on the chromogranin A level from 07/27/2012. Mr. Holleman will return for an office visit and repeat chromogranin A/24-hour urine 5-HIAA in one month. He knows to hold the Nexium for 5 days prior to the next chromogranin A level.   [name], MD  08/01/2012  4:39 PM

## 2012-08-01 NOTE — Telephone Encounter (Signed)
appts made and printed for  Pt aom °

## 2012-08-02 ENCOUNTER — Other Ambulatory Visit: Payer: Self-pay | Admitting: *Deleted

## 2012-08-02 DIAGNOSIS — C7A021 Malignant carcinoid tumor of the cecum: Secondary | ICD-10-CM

## 2012-08-02 LAB — COMPREHENSIVE METABOLIC PANEL
AST: 23 U/L (ref 0–37)
Albumin: 3.9 g/dL (ref 3.5–5.2)
BUN: 10 mg/dL (ref 6–23)
CO2: 24 mEq/L (ref 19–32)
Calcium: 9.7 mg/dL (ref 8.4–10.5)
Chloride: 107 mEq/L (ref 96–112)
Glucose, Bld: 87 mg/dL (ref 70–99)
Potassium: 3.9 mEq/L (ref 3.5–5.3)

## 2012-08-25 ENCOUNTER — Ambulatory Visit (INDEPENDENT_AMBULATORY_CARE_PROVIDER_SITE_OTHER): Payer: 59 | Admitting: General Surgery

## 2012-08-25 ENCOUNTER — Encounter (INDEPENDENT_AMBULATORY_CARE_PROVIDER_SITE_OTHER): Payer: Self-pay | Admitting: General Surgery

## 2012-08-25 VITALS — BP 148/90 | HR 88 | Temp 98.2°F | Resp 16 | Ht 73.0 in | Wt 216.0 lb

## 2012-08-25 DIAGNOSIS — C7A021 Malignant carcinoid tumor of the cecum: Secondary | ICD-10-CM

## 2012-08-25 NOTE — Progress Notes (Signed)
Patient ID: Tommy Conley, male   DOB: 12/16/1951, 61 y.o.   MRN: [id number]  Chief Complaint  Patient presents with  . Routine Post Op    po colon    HPI [name] is a 61 y.o. male.  He returns for long-term followup regarding his metastatic carcinoid tumor.  This gentleman presented with primary carcinoid tumor of the ileocecal area and extensive liver metastasis last year. On 09/29/2011 he underwent and ileo-right colectomy and liver biopsy. He recovered from that surgery uneventfully.  He continues frequent followup with Dr. Truett Perna and has undergone chemotherapy with Xeloda/temozolomide  as will as Y-90 infusion to right and left hepatic lobes.  Symptomatically he is doing well. His weight is stable. He's tolerating diet without cramps or nausea. He doesn't really have the diarrhea that he had preop. He'll have to 4 bowel movements per day. His diaphoresis with exertion, but really no flushing. He has no bowel pain.  Chromogranin levels are elevated. Liver function tests are normal. He is scheduled to see Dr. Truett Perna next week. He knows that he is due for colonoscopy. HPI  Past Medical History  Diagnosis Date  . Hypertension   . Barrett's esophagus   . GERD (gastroesophageal reflux disease)   . Colon polyp   . Cancer     colon ca with liver lesions  . Carcinoid tumor of cecum     Past Surgical History  Procedure Date  . Colonoscopy 07/2011  . Upper gastrointestinal endoscopy     hx barrett's esophagus  . Colectomy 09/29/11    lap - right with liver bx  . Ight colon/ileum resection 09/29/2011  . Wedge liver biopsy 09/29/11    right liver lesion    Family History  Problem Relation Age of Onset  . Colon polyps Neg Hx   . Colon cancer Neg Hx   . Stomach cancer Neg Hx   . Cancer Neg Hx     Social History History  Substance Use Topics  . Smoking status: Former Smoker -- 0.2 packs/day for 17 years    Types: Cigarettes    Quit date: 02/19/2012  .  Smokeless tobacco: Never Used  . Alcohol Use: No    No Known Allergies  Current Outpatient Prescriptions  Medication Sig Dispense Refill  . amLODipine (NORVASC) 10 MG tablet Take 10 mg by mouth every morning.      . hyoscyamine (LEVBID) 0.375 MG 12 hr tablet Take 0.375 mg by mouth every morning. For muscle cramps      . losartan (COZAAR) 50 MG tablet Take 50 mg by mouth every evening.      [name] NEXIUM 40 MG capsule Take 40 mg by mouth daily before breakfast.         Review of Systems Review of Systems  Constitutional: Negative for fever, chills and unexpected weight change.  HENT: Negative for hearing loss, congestion, sore throat, trouble swallowing and voice change.   Eyes: Negative for visual disturbance.  Respiratory: Negative for cough and wheezing.   Cardiovascular: Negative for chest pain, palpitations and leg swelling.  Gastrointestinal: Negative for nausea, vomiting, abdominal pain, diarrhea, constipation, blood in stool, abdominal distention, anal bleeding and rectal pain.  Genitourinary: Negative for hematuria and difficulty urinating.  Musculoskeletal: Negative for arthralgias.  Skin: Negative for rash and wound.  Neurological: Negative for seizures, syncope, weakness and headaches.  Hematological: Negative for adenopathy. Does not bruise/bleed easily.  Psychiatric/Behavioral: Negative for confusion.    Blood pressure 148/90, pulse 88,  temperature 98.2 F (36.8 C), resp. rate 16, height 6\' 1"  (1.854 m), weight 216 lb (97.977 kg).  Physical Exam Physical Exam  Constitutional: He is oriented to person, place, and time. He appears well-developed and well-nourished. No distress.  HENT:  Head: Normocephalic.  Nose: Nose normal.  Mouth/Throat: No oropharyngeal exudate.  Eyes: Conjunctivae and EOM are normal. Pupils are equal, round, and reactive to light. Right eye exhibits no discharge. Left eye exhibits no discharge. No scleral icterus.  Neck: Normal range of motion.  Neck supple. No JVD present. No tracheal deviation present. No thyromegaly present.  Cardiovascular: Normal rate, regular rhythm, normal heart sounds and intact distal pulses.   No murmur heard. Pulmonary/Chest: Effort normal and breath sounds normal. No stridor. No respiratory distress. He has no wheezes. He has no rales. He exhibits no tenderness.       No axillary adenopathy  Abdominal: Soft. Bowel sounds are normal. He exhibits no distension and no mass. There is no tenderness. There is no rebound and no guarding.       Midline scar well-healed. No hernia. No double mass. Liver not palpable.  Genitourinary:       No inguinal adenopathy  Musculoskeletal: Normal range of motion. He exhibits no edema and no tenderness.  Lymphadenopathy:    He has no cervical adenopathy.  Neurological: He is alert and oriented to person, place, and time. He has normal reflexes. Coordination normal.  Skin: Skin is warm and dry. No rash noted. He is not diaphoretic. No erythema. No pallor.  Psychiatric: He has a normal mood and affect. His behavior is normal. Judgment and thought content normal.    Data Reviewed Imaging studies. Lab work. Notes from the cancer center.  Assessment    Malignant carcinoid tumor of ileocecal area, with extensive liver metastasis.  Status post ileo-right colectomy, 09/29/1999 well. No apparent surgical complications.  Normal gastrointestinal function without obstructive symptoms and with no clinical evidence of carcinoid syndrome, despite extensive liver metastasis.    Plan    Continue regular followup with Dr. Truett Perna.  He is advised to call Dr. Russella Dar and set up colonoscopy at this time  Return to see me in one year.       Angelia Mould. Derrell Lolling, M.D., [facility] General and Minimally invasive Surgery Breast and Colorectal Surgery Office:   [phone] Pager:   [phone]  08/25/2012, 11:07 AM

## 2012-08-25 NOTE — Patient Instructions (Signed)
Your physical exam today reveals no abnormalities in the lymph node areas, liver, or abdomen.  Keep your regular appointments with Dr. Truett Perna and continue the treatments that he prescribes.  You are  due for a colonoscopy with Dr. Russella Dar at this time. Please call him and set that up.  Return to see me in one year.

## 2012-08-29 ENCOUNTER — Other Ambulatory Visit (HOSPITAL_BASED_OUTPATIENT_CLINIC_OR_DEPARTMENT_OTHER): Payer: 59

## 2012-08-29 ENCOUNTER — Ambulatory Visit (HOSPITAL_BASED_OUTPATIENT_CLINIC_OR_DEPARTMENT_OTHER): Payer: 59 | Admitting: Nurse Practitioner

## 2012-08-29 ENCOUNTER — Telehealth: Payer: Self-pay | Admitting: Oncology

## 2012-08-29 VITALS — BP 129/92 | HR 80 | Temp 97.4°F | Resp 18 | Ht 73.0 in | Wt 213.6 lb

## 2012-08-29 DIAGNOSIS — C7A012 Malignant carcinoid tumor of the ileum: Secondary | ICD-10-CM

## 2012-08-29 DIAGNOSIS — C7A021 Malignant carcinoid tumor of the cecum: Secondary | ICD-10-CM

## 2012-08-29 DIAGNOSIS — C787 Secondary malignant neoplasm of liver and intrahepatic bile duct: Secondary | ICD-10-CM

## 2012-08-29 LAB — CBC WITH DIFFERENTIAL/PLATELET
BASO%: 0.6 % (ref 0.0–2.0)
Basophils Absolute: 0 10*3/uL (ref 0.0–0.1)
EOS%: 2.4 % (ref 0.0–7.0)
HGB: 13.1 g/dL (ref 13.0–17.1)
MCH: 26.7 pg — ABNORMAL LOW (ref 27.2–33.4)
MCHC: 32.8 g/dL (ref 32.0–36.0)
RDW: 14.6 % (ref 11.0–14.6)
WBC: 5.1 10*3/uL (ref 4.0–10.3)
lymph#: 0.8 10*3/uL — ABNORMAL LOW (ref 0.9–3.3)

## 2012-08-29 LAB — COMPREHENSIVE METABOLIC PANEL (CC13)
AST: 21 U/L (ref 5–34)
Albumin: 3.5 g/dL (ref 3.5–5.0)
Alkaline Phosphatase: 85 U/L (ref 40–150)
Potassium: 3.3 mEq/L — ABNORMAL LOW (ref 3.5–5.1)
Sodium: 139 mEq/L (ref 136–145)
Total Protein: 6.3 g/dL — ABNORMAL LOW (ref 6.4–8.3)

## 2012-08-29 NOTE — Telephone Encounter (Signed)
appts made and printed for pt aom °

## 2012-08-29 NOTE — Progress Notes (Signed)
OFFICE PROGRESS NOTE  Interval history:  [name] returns as scheduled. He feels well. He denies abdominal pain. He discontinued Nexium approximately 5 days ago in anticipation of the chromogranin A level being drawn today. Since discontinuing the Nexium he has noted a decrease in the number of bowel movements he has on a daily basis. He estimates one bowel movement per day since stopping the Nexium. He has noted no reflux symptoms being off of Nexium. No flushing episodes. He noted that he was "hot" for one day over the past month. He has a good appetite and good energy level. Aside from a minor sinus infection, no interim illnesses or infections.   Objective: Blood pressure 129/92, pulse 80, temperature 97.4 F (36.3 C), temperature source Oral, resp. rate 18, height 6\' 1"  (1.854 m), weight 213 lb 9.6 oz (96.888 kg).  Oropharynx is without thrush or ulceration. No palpable cervical or supraclavicular lymph nodes. Lungs are clear. Regular cardiac rhythm. Abdomen soft and nontender. No organomegaly. Extremities are without edema.  Lab Results: Lab Results  Component Value Date   WBC 5.1 08/29/2012   HGB 13.1 08/29/2012   HCT 39.8 08/29/2012   MCV 81.3 08/29/2012   PLT 179 08/29/2012    Chemistry:    Chemistry      Component Value Date/Time   NA 142 07/27/2012 0905   NA 141 04/04/2012 1106   K 3.9 07/27/2012 0905   K 3.5 04/04/2012 1106   CL 107 07/27/2012 0905   CL 103 04/04/2012 1106   CO2 24 07/27/2012 0905   CO2 28 04/04/2012 1106   BUN 10 07/27/2012 0905   BUN 8 04/04/2012 1106   CREATININE 0.98 07/27/2012 0905   CREATININE 0.9 04/04/2012 1106      Component Value Date/Time   CALCIUM 9.7 07/27/2012 0905   CALCIUM 8.8 04/04/2012 1106   ALKPHOS 78 07/27/2012 0905   ALKPHOS 70 04/04/2012 1106   AST 23 07/27/2012 0905   AST 21 04/04/2012 1106   ALT 21 07/27/2012 0905   BILITOT 0.1* 07/27/2012 0905   BILITOT 0.50 04/04/2012 1106       Studies/Results: No results found.  Medications: I have reviewed  the patient's current medications.  Assessment/Plan:  1. 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  a. Status post a right colon/ileum resection on 09/29/2011.  b. Wedge biopsy of a right liver lesion on 09/29/2011 confirmed metastatic well-differentiated neuroendocrine carcinoma.  c. CT of the abdomen 08/25/2011 consistent with multiple liver metastases.  d. Elevated preoperative chromogranin A level and a 24-hour urine 5-HIAA level. Stable on repeat 11/16/2011. e. Restaging CT on 12/28/2011 confirmed slight progression of the metastatic liver lesions. f. Status post 3 cycles of Xeloda/temozolomide with cycle 3 initiated on 03/12/2012 g. Restaging CT of the abdomen 04/04/2012 revealed slight progression of the metastatic liver lesions and transverse mesocolon adenopathy. h. Status post Y-90 radioembolization of the right liver on 05/19/2012. i. Status post Y-90 radioembolization of the left liver on 07/06/2012. j. Chromogranin A level mildly increased on 07/27/2012 (he was taking Nexium at the time). 2. History of diarrhea (3 to 4 times per day), predating surgery for 8 months. Decrease in daily bowel movements coinciding with discontinuation of Nexium. 3. Hypertension. 4. History of gastroesophageal reflux disease. No reflux symptoms since discontinuing Nexium. 5. Rectal and ascending colon polyps. 6. Iron deficiency anemia-improved.  Disposition-Mr. Shark appears well. We will followup on the chromogranin A level from today and  24-hour urine 5-HIAA. He will return for a followup visit in 6-8 weeks. He will contact the office in the interim with any problems.  Plan reviewed with Dr. Truett Perna.  Lonna Cobb ANP/GNP-BC

## 2012-09-05 ENCOUNTER — Other Ambulatory Visit: Payer: 59 | Admitting: Lab

## 2012-09-07 ENCOUNTER — Telehealth: Payer: Self-pay | Admitting: *Deleted

## 2012-09-07 DIAGNOSIS — C7A021 Malignant carcinoid tumor of the cecum: Secondary | ICD-10-CM

## 2012-09-07 MED ORDER — POTASSIUM CHLORIDE CRYS ER 20 MEQ PO TBCR: 20.0000 meq | EXTENDED_RELEASE_TABLET | Freq: Every day | ORAL | Status: DC

## 2012-09-07 NOTE — Telephone Encounter (Signed)
Message copied by Wandalee Ferdinand on Wed Sep 07, 2012  4:28 PM ------      Message from: [name] B      Created: Wed Aug 31, 2012  9:47 PM       Please call patient, potassium is low-likely due to diarrhea, start kcl daily, f/u as scheduled, check bmet next visit

## 2012-09-07 NOTE — Telephone Encounter (Signed)
Left VM regarding low K+ level, probably due to diarrhea. Ktab 20 meq e-scribed to [facility]. Will recheck on 10/28 at 1030 prior to seeing MD. Requested he call to confirm message.

## 2012-09-08 ENCOUNTER — Telehealth: Payer: Self-pay | Admitting: *Deleted

## 2012-09-08 NOTE — Telephone Encounter (Signed)
Message from pt confirming her received message from 09/07/12.

## 2012-09-14 ENCOUNTER — Telehealth: Payer: Self-pay | Admitting: *Deleted

## 2012-09-14 NOTE — Telephone Encounter (Signed)
Received call from [facility] stated "that Dr. Miles Costain will leave it up to Dr. Truett Perna if he wants to order PET or CT scan for pt and they will follow-up with pt in 2 weeks"  Note given to Dr. Truett Perna.

## 2012-10-07 ENCOUNTER — Encounter: Payer: Self-pay | Admitting: Gastroenterology

## 2012-10-17 ENCOUNTER — Ambulatory Visit (HOSPITAL_BASED_OUTPATIENT_CLINIC_OR_DEPARTMENT_OTHER): Payer: 59 | Admitting: Oncology

## 2012-10-17 ENCOUNTER — Telehealth: Payer: Self-pay | Admitting: Oncology

## 2012-10-17 ENCOUNTER — Other Ambulatory Visit (HOSPITAL_BASED_OUTPATIENT_CLINIC_OR_DEPARTMENT_OTHER): Payer: 59 | Admitting: Lab

## 2012-10-17 VITALS — BP 117/76 | HR 79 | Temp 97.0°F | Resp 18 | Ht 73.0 in | Wt 216.3 lb

## 2012-10-17 DIAGNOSIS — I10 Essential (primary) hypertension: Secondary | ICD-10-CM

## 2012-10-17 DIAGNOSIS — D509 Iron deficiency anemia, unspecified: Secondary | ICD-10-CM

## 2012-10-17 DIAGNOSIS — C787 Secondary malignant neoplasm of liver and intrahepatic bile duct: Secondary | ICD-10-CM

## 2012-10-17 DIAGNOSIS — C7A021 Malignant carcinoid tumor of the cecum: Secondary | ICD-10-CM

## 2012-10-17 DIAGNOSIS — C7A012 Malignant carcinoid tumor of the ileum: Secondary | ICD-10-CM

## 2012-10-17 LAB — BASIC METABOLIC PANEL (CC13)
CO2: 25 mEq/L (ref 22–29)
Calcium: 10.1 mg/dL (ref 8.4–10.4)
Creatinine: 0.9 mg/dL (ref 0.7–1.3)
Glucose: 117 mg/dl — ABNORMAL HIGH (ref 70–99)

## 2012-10-17 NOTE — Progress Notes (Signed)
   [facility]    OFFICE PROGRESS NOTE   INTERVAL HISTORY:   He returns as scheduled. No complaint. No diarrhea. No abdominal pain. He reports an improved energy level. He continues to work.  Objective:  Vital signs in last 24 hours:  There were no vitals taken for this visit.    HEENT: Neck without mass Lymphatics: No cervical or supraclavicular nodes Resp: Distant breath sounds with end inspiratory coarse rhonchi at the left greater than right base, no respiratory distress Cardio: Regular rate and rhythm GI: No hepatosplenomegaly, nontender Vascular: No leg edema   Lab Results:  24 urine 5 HIAA on 09/05/2012-163.8 Chromogranin A on 08/29/2012-142 Medications: I have reviewed the patient's current medications.  Assessment/Plan: 1. 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  a. Status post a right colon/ileum resection on 09/29/2011.  b. Wedge biopsy of a right liver lesion on 09/29/2011 confirmed metastatic well-differentiated neuroendocrine carcinoma.  c. CT of the abdomen 08/25/2011 consistent with multiple liver metastases.  d. Elevated preoperative chromogranin A level and a 24-hour urine 5-HIAA level. Stable on repeat 11/16/2011. e. Restaging CT on 12/28/2011 confirmed slight progression of the metastatic liver lesions. f. Status post 3 cycles of Xeloda/temozolomide with cycle 3 initiated on 03/12/2012 g. Restaging CT of the abdomen 04/04/2012 revealed slight progression of the metastatic liver lesions and transverse mesocolon adenopathy. h. Status post Y-90 radioembolization of the right liver on 05/19/2012. i. Status post Y-90 radioembolization of the left liver on 07/06/2012. j. Chromogranin A level decreased on 08/29/2012, 24-hour urine 5 HIAA slightly increased on 09/05/2012 2. History of diarrhea (3 to 4 times per day), predating surgery for 8 months. Decrease in  daily bowel movements coinciding with discontinuation of Nexium. 3. Hypertension. 4. History of gastroesophageal reflux disease. No reflux symptoms since discontinuing Nexium. 5. Rectal and ascending colon polyps. 6. Iron deficiency anemia-improved.   Disposition:  He appears asymptomatic from the metastatic carcinoid tumor. We will followup on the chromogranin A level from today. He will be scheduled for a restaging CT of the abdomen and office visit in approximately 2 months.   [name], MD  10/17/2012  11:30 AM

## 2012-10-17 NOTE — Telephone Encounter (Signed)
Printed and gv appt schedule to pt for Dec.....gv pt inst..for ct abdomin

## 2012-10-27 ENCOUNTER — Other Ambulatory Visit: Payer: Self-pay | Admitting: Interventional Radiology

## 2012-10-27 DIAGNOSIS — C7B02 Secondary carcinoid tumors of liver: Secondary | ICD-10-CM

## 2012-12-05 ENCOUNTER — Ambulatory Visit (HOSPITAL_COMMUNITY)
Admission: RE | Admit: 2012-12-05 | Discharge: 2012-12-05 | Disposition: A | Discharge status: Home / Self Care | Payer: 59 | Source: Ambulatory Visit | Attending: Oncology | Admitting: Oncology

## 2012-12-05 DIAGNOSIS — K7689 Other specified diseases of liver: Secondary | ICD-10-CM | POA: Insufficient documentation

## 2012-12-05 DIAGNOSIS — C7A021 Malignant carcinoid tumor of the cecum: Secondary | ICD-10-CM | POA: Insufficient documentation

## 2012-12-05 DIAGNOSIS — Z980 Intestinal bypass and anastomosis status: Secondary | ICD-10-CM | POA: Insufficient documentation

## 2012-12-05 DIAGNOSIS — M799 Soft tissue disorder, unspecified: Secondary | ICD-10-CM | POA: Insufficient documentation

## 2012-12-05 MED ORDER — IOHEXOL 300 MG/ML  SOLN
100.0000 mL | Freq: Once | INTRAMUSCULAR | Status: AC | PRN
  Administered 2012-12-05: 100 mL via INTRAVENOUS

## 2012-12-06 ENCOUNTER — Encounter: Payer: Self-pay | Admitting: Gastroenterology

## 2012-12-06 ENCOUNTER — Ambulatory Visit (AMBULATORY_SURGERY_CENTER): Payer: 59 | Admitting: *Deleted

## 2012-12-06 VITALS — Ht 73.0 in | Wt 215.0 lb

## 2012-12-06 DIAGNOSIS — Z85038 Personal history of other malignant neoplasm of large intestine: Secondary | ICD-10-CM

## 2012-12-06 DIAGNOSIS — Z1211 Encounter for screening for malignant neoplasm of colon: Secondary | ICD-10-CM

## 2012-12-06 MED ORDER — MOVIPREP 100 G PO SOLR: ORAL | Status: DC

## 2012-12-09 ENCOUNTER — Telehealth: Payer: Self-pay | Admitting: Oncology

## 2012-12-09 ENCOUNTER — Other Ambulatory Visit: Payer: 59

## 2012-12-09 ENCOUNTER — Ambulatory Visit (HOSPITAL_BASED_OUTPATIENT_CLINIC_OR_DEPARTMENT_OTHER): Payer: 59 | Admitting: Oncology

## 2012-12-09 VITALS — BP 138/83 | HR 85 | Temp 97.1°F | Resp 20 | Ht 73.0 in | Wt 217.6 lb

## 2012-12-09 DIAGNOSIS — C7A012 Malignant carcinoid tumor of the ileum: Secondary | ICD-10-CM

## 2012-12-09 DIAGNOSIS — C787 Secondary malignant neoplasm of liver and intrahepatic bile duct: Secondary | ICD-10-CM

## 2012-12-09 DIAGNOSIS — C7A021 Malignant carcinoid tumor of the cecum: Secondary | ICD-10-CM

## 2012-12-09 DIAGNOSIS — D509 Iron deficiency anemia, unspecified: Secondary | ICD-10-CM

## 2012-12-09 DIAGNOSIS — I10 Essential (primary) hypertension: Secondary | ICD-10-CM

## 2012-12-09 NOTE — Progress Notes (Signed)
Scheduled for colonoscopy 12/20/12 with Dr. Russella Dar

## 2012-12-09 NOTE — Progress Notes (Signed)
   [facility]    OFFICE PROGRESS NOTE   INTERVAL HISTORY:   He returns as scheduled. He continues to have frequent bowel movements, but no diarrhea. No complaint. He feels well. No flushing or fever.  Objective:  Vital signs in last 24 hours:  Blood pressure 138/83, pulse 85, temperature 97.1 F (36.2 C), temperature source Oral, resp. rate 20, height 6\' 1"  (1.854 m), weight 217 lb 9.6 oz (98.703 kg).    HEENT: Neck without mass Lymphatics: No cervical, supraclavicular, or axillary nodes Resp: Lungs with decreased breath sounds and and inspiratory rhonchi at the right base, no respiratory distress Cardio: Regular rate and rhythm GI: No hepatomegaly, no mass, nontender Vascular: No leg edema   Lab Results:  Chromogranin A level pending  CT of the abdomen on 12/05/2012, compared to 04/21/12-the dominant lesion in the right liver measures 10.5 x 8.8 compared to a previous measurement of 9.7 x 7.9 cm. A left hepatic lobe lesion is smaller and a peripheral right hepatic lesion is smaller. A soft tissue nodule in the mesentery measures 1.6 x 2.5 cm has not changed significantly.   Medications: I have reviewed the patient's current medications.  Assessment/Plan: 1. 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  a. Status post a right colon/ileum resection on 09/29/2011.  b. Wedge biopsy of a right liver lesion on 09/29/2011 confirmed metastatic well-differentiated neuroendocrine carcinoma.  c. CT of the abdomen 08/25/2011 consistent with multiple liver metastases.  d. Elevated preoperative chromogranin A level and a 24-hour urine 5-HIAA level. Stable on repeat 11/16/2011. e. Restaging CT on 12/28/2011 confirmed slight progression of the metastatic liver lesions. f. Status post 3 cycles of Xeloda/temozolomide with cycle 3 initiated on 03/12/2012 g. Restaging CT of the abdomen 04/04/2012  revealed slight progression of the metastatic liver lesions and transverse mesocolon adenopathy. h. Status post Y-90 radioembolization of the right liver on 05/19/2012. i. Status post Y-90 radioembolization of the left liver on 07/06/2012. j. Chromogranin A level decreased on 08/29/2012, 24-hour urine 5 HIAA slightly increased on 09/05/2012 k. Restaging CT on 12/05/2012 with a mixed response in the liver 2. History of diarrhea (3 to 4 times per day), predating surgery for 8 months. Decrease in daily bowel movements coinciding with discontinuation of Nexium. 3. Hypertension. 4. History of gastroesophageal reflux disease. No reflux symptoms since discontinuing Nexium. 5. Rectal and ascending colon polyps. He is scheduled for a colonoscopy on 12/20/2012. 6. Iron deficiency anemia-improved.  Disposition:  He appears stable. [name] appears asymptomatic from the metastatic carcinoid tumor. We will followup on the chromogranin A level from today. The restaging CT reveals improvement in several of the liver lesions and an increase in the size of a dominant right liver lesion.  The plan is to continue an observation approach. He will return for an office visit and chromogranin A level in 4 months. We will consider sunitinib or Afinitor therapy if there is clear evidence of disease progression. The plan is to obtain a restaging CT evaluation in 9-12 months.   [name], MD  12/09/2012  9:40 AM

## 2012-12-09 NOTE — Telephone Encounter (Signed)
appts made and printed for pt aom °

## 2012-12-20 ENCOUNTER — Encounter: Payer: Self-pay | Admitting: Gastroenterology

## 2012-12-20 ENCOUNTER — Ambulatory Visit (AMBULATORY_SURGERY_CENTER): Payer: 59 | Admitting: Gastroenterology

## 2012-12-20 VITALS — BP 137/98 | HR 68 | Temp 97.1°F | Resp 26 | Ht 73.0 in | Wt 217.0 lb

## 2012-12-20 DIAGNOSIS — C7A029 Malignant carcinoid tumor of the large intestine, unspecified portion: Secondary | ICD-10-CM

## 2012-12-20 DIAGNOSIS — D126 Benign neoplasm of colon, unspecified: Secondary | ICD-10-CM

## 2012-12-20 DIAGNOSIS — Z860100 Personal history of colon polyps, unspecified: Secondary | ICD-10-CM

## 2012-12-20 DIAGNOSIS — Z1211 Encounter for screening for malignant neoplasm of colon: Secondary | ICD-10-CM

## 2012-12-20 DIAGNOSIS — C7A021 Malignant carcinoid tumor of the cecum: Secondary | ICD-10-CM

## 2012-12-20 MED ORDER — SODIUM CHLORIDE 0.9 % IV SOLN: 500.0000 mL | INTRAVENOUS | Status: DC

## 2012-12-20 NOTE — Op Note (Signed)
[facility] [address] [place], [address]   COLONOSCOPY PROCEDURE REPORT  PATIENT: Tommy Conley, Tommy Conley  MR#: [id number] BIRTHDATE: 06/15/1951 , 61  yrs. old GENDER: Male ENDOSCOPIST: Meryl Dare, MD, [facility] PROCEDURE DATE:  12/20/2012 PROCEDURE:   Colonoscopy with snare polypectomy ASA CLASS:   Class II INDICATIONS:Patient's personal history of adenomatous colon polyps.  MEDICATIONS: MAC sedation, administered by CRNA and propofol (Diprivan) 300mg  IV DESCRIPTION OF PROCEDURE:   After the risks benefits and alternatives of the procedure were thoroughly explained, informed consent was obtained.  A digital rectal exam revealed no abnormalities of the rectum.   The Fuse-Demo-Scope  endoscope was introduced through the anus and advanced to the surgical anastomosis. No adverse events experienced.   The quality of the prep was good, using MoviPrep  The instrument was then slowly withdrawn as the colon was fully examined.     COLON FINDINGS: A sessile polyp measuring 6 mm in size was found in the ascending colon.  A polypectomy was performed with a cold snare.  The resection was complete and the polyp tissue was completely retrieved.   A sessile polyp measuring 5 mm in size was found in the rectum.  A polypectomy was performed with a cold snare.  The resection was complete and the polyp tissue was completely retrieved. Ileocolonic anastomosis in the ascending colon.  The colon was otherwise normal.  There was no diverticulosis, inflammation, polyps or cancers unless previously stated. Retroflexed views revealed small internal hemorrhoids. The time to cecum=2 minutes 47 seconds.  Withdrawal time=12 minutes 40 seconds.  The scope was withdrawn and the procedure completed.  COMPLICATIONS: There were no complications.  ENDOSCOPIC IMPRESSION: 1.   Sessile polyp measuring 6 mm in size was found in the ascending colon; polypectomy was performed with a cold  snare 2.   Sessile polyp measuring 5 mm in size was found in the rectum; polypectomy was performed with a cold snare 3.   Prior right colon resection with a normal appearing ileocolonic anastomosis 3.   Small internal hemorhoids  RECOMMENDATIONS: 1.  Await pathology results 2.  Repeat Colonoscopy in 5 years.   eSigned:  Meryl Dare, MD, [facility] 12/20/2012 9:58 AM   cc: Lilyan Punt, MD

## 2012-12-20 NOTE — Patient Instructions (Addendum)

## 2012-12-20 NOTE — Progress Notes (Signed)
Patient did not experience any of the following events: a burn prior to discharge; a fall within the facility; wrong site/side/patient/procedure/implant event; or a hospital transfer or hospital admission upon discharge from the facility. (G8907) Patient did not have preoperative order for IV antibiotic SSI prophylaxis. (G8918)  

## 2012-12-20 NOTE — Progress Notes (Signed)
Pt voided large amount in urinal with no difficulty. ewm

## 2012-12-22 ENCOUNTER — Telehealth: Payer: Self-pay | Admitting: *Deleted

## 2012-12-22 NOTE — Telephone Encounter (Signed)
Left message that we called for f/u 

## 2012-12-27 ENCOUNTER — Encounter: Payer: Self-pay | Admitting: Gastroenterology

## 2012-12-27 ENCOUNTER — Telehealth: Payer: Self-pay | Admitting: Gastroenterology

## 2012-12-27 NOTE — Telephone Encounter (Signed)
I have advised her I placed a note at the front desk that states the patient was here on 12/20/12

## 2012-12-29 ENCOUNTER — Ambulatory Visit
Admission: RE | Admit: 2012-12-29 | Discharge: 2012-12-29 | Disposition: A | Discharge status: Home / Self Care | Payer: 59 | Source: Ambulatory Visit | Attending: Interventional Radiology | Admitting: Interventional Radiology

## 2012-12-29 DIAGNOSIS — C7B02 Secondary carcinoid tumors of liver: Secondary | ICD-10-CM

## 2012-12-29 NOTE — Progress Notes (Signed)
Appetite:  Good.  Weight: stable.  Denies nausea, vomiting or diarrhea.  Does experience frequent bowel movements, usually 2-4 x's/day.  Denies pain.    Endurance:  Good.  Sleeping:  Good.  Last office visit w/ Dr Truett Perna on 12/09/2012.   Next appointment w/ Dr Truett Perna to be April 2014.  States that he plans to start walking daily when the weather improves.

## 2013-04-05 ENCOUNTER — Other Ambulatory Visit: Payer: Self-pay | Admitting: Family Medicine

## 2013-04-10 ENCOUNTER — Encounter: Payer: Self-pay | Admitting: Lab

## 2013-04-11 ENCOUNTER — Other Ambulatory Visit (HOSPITAL_BASED_OUTPATIENT_CLINIC_OR_DEPARTMENT_OTHER): Payer: 59 | Admitting: Lab

## 2013-04-11 ENCOUNTER — Ambulatory Visit (HOSPITAL_BASED_OUTPATIENT_CLINIC_OR_DEPARTMENT_OTHER): Payer: 59 | Admitting: Nurse Practitioner

## 2013-04-11 ENCOUNTER — Telehealth: Payer: Self-pay | Admitting: Oncology

## 2013-04-11 VITALS — BP 137/87 | HR 89 | Temp 97.6°F | Resp 20 | Ht 73.0 in | Wt 220.7 lb

## 2013-04-11 DIAGNOSIS — C7A012 Malignant carcinoid tumor of the ileum: Secondary | ICD-10-CM

## 2013-04-11 DIAGNOSIS — I10 Essential (primary) hypertension: Secondary | ICD-10-CM

## 2013-04-11 DIAGNOSIS — C787 Secondary malignant neoplasm of liver and intrahepatic bile duct: Secondary | ICD-10-CM

## 2013-04-11 DIAGNOSIS — C7A021 Malignant carcinoid tumor of the cecum: Secondary | ICD-10-CM

## 2013-04-11 LAB — BASIC METABOLIC PANEL (CC13)
Chloride: 106 mEq/L (ref 98–107)
Potassium: 3.2 mEq/L — ABNORMAL LOW (ref 3.5–5.1)
Sodium: 142 mEq/L (ref 136–145)

## 2013-04-11 NOTE — Progress Notes (Signed)
OFFICE PROGRESS NOTE  Interval history:  Tommy Conley returns as scheduled. He feels well. He has a good appetite and energy level. His weight is stable to slightly increased. He denies abdominal pain. He estimates 2-4 stools per day. Stools range from loose to formed. He notes consistency of bowel movements often related to dietary intake.   Objective: Blood pressure 137/87, pulse 89, temperature 97.6 F (36.4 C), temperature source Oral, resp. rate 20, height 6\' 1"  (1.854 m), weight 220 lb 11.2 oz (100.109 kg).  Oropharynx is without thrush or ulceration. No palpable cervical, supraclavicular or axillary lymph nodes. Inspiratory rales at both lung bases right greater than left. Regular cardiac rhythm. Abdomen is soft and nontender. No hepatomegaly. Extremities are without edema.  Lab Results: Lab Results  Component Value Date   WBC 5.1 08/29/2012   HGB 13.1 08/29/2012   HCT 39.8 08/29/2012   MCV 81.3 08/29/2012   PLT 179 08/29/2012    Chemistry:    Chemistry      Component Value Date/Time   NA 139 10/17/2012 1053   NA 142 07/27/2012 0905   NA 141 04/04/2012 1106   K 3.7 10/17/2012 1053   K 3.9 07/27/2012 0905   K 3.5 04/04/2012 1106   CL 105 10/17/2012 1053   CL 107 07/27/2012 0905   CL 103 04/04/2012 1106   CO2 25 10/17/2012 1053   CO2 24 07/27/2012 0905   CO2 28 04/04/2012 1106   BUN 8.0 10/17/2012 1053   BUN 10 07/27/2012 0905   BUN 8 04/04/2012 1106   CREATININE 0.9 10/17/2012 1053   CREATININE 0.98 07/27/2012 0905   CREATININE 0.9 04/04/2012 1106      Component Value Date/Time   CALCIUM 10.1 10/17/2012 1053   CALCIUM 9.7 07/27/2012 0905   CALCIUM 8.8 04/04/2012 1106   ALKPHOS 85 08/29/2012 0921   ALKPHOS 78 07/27/2012 0905   ALKPHOS 70 04/04/2012 1106   AST 21 08/29/2012 0921   AST 23 07/27/2012 0905   AST 21 04/04/2012 1106   ALT 25 08/29/2012 0921   ALT 21 07/27/2012 0905   BILITOT 0.30 08/29/2012 0921   BILITOT 0.1* 07/27/2012 0905   BILITOT 0.50 04/04/2012 1106       Studies/Results: No  results found.  Medications: I have reviewed the patient's current medications.  Assessment/Plan:  1. 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  a. Status post a right colon/ileum resection on 09/29/2011.  b. Wedge biopsy of a right liver lesion on 09/29/2011 confirmed metastatic well-differentiated neuroendocrine carcinoma.  c. CT of the abdomen 08/25/2011 consistent with multiple liver metastases.  d. Elevated preoperative chromogranin A level and a 24-hour urine 5-HIAA level. Stable on repeat 11/16/2011. e. Restaging CT on 12/28/2011 confirmed slight progression of the metastatic liver lesions. f. Status post 3 cycles of Xeloda/temozolomide with cycle 3 initiated on 03/12/2012 g. Restaging CT of the abdomen 04/04/2012 revealed slight progression of the metastatic liver lesions and transverse mesocolon adenopathy. h. Status post Y-90 radioembolization of the right liver on 05/19/2012. i. Status post Y-90 radioembolization of the left liver on 07/06/2012. j. Chromogranin A level decreased on 08/29/2012, 24-hour urine 5 HIAA slightly increased on 09/05/2012 k. Restaging CT on 12/05/2012 with a mixed response in the liver 2. History of diarrhea (3 to 4 times per day), predating surgery for 8 months. Decrease in daily bowel movements coinciding with discontinuation of Nexium. 3. Hypertension. 4. History of gastroesophageal reflux disease. No reflux  symptoms since discontinuing Nexium. 5. Rectal and ascending colon polyps. He underwent a colonoscopy on 12/20/2012 with findings of a sessile polyp in the ascending colon and a sessile polyp in the rectum. Pathology showed hyperplastic polyps. Prior right colon resection noted with a normal appearing ileocolonic anastomosis. Small internal hemorrhoids noted. Repeat colonoscopy in 5 years. 6. Iron deficiency anemia-improved.  Disposition-Tommy Conley appears well. He  appears asymptomatic from the metastatic carcinoid tumor. We will followup on the chromogranin A level from today. Restaging CT evaluation will be obtained in approximately 4 months with a followup visit one week later to review the results. He will contact the office in the interim with any problems.  Plan reviewed with Dr. Truett Perna.  Lonna Cobb ANP/GNP-BC

## 2013-04-11 NOTE — Telephone Encounter (Signed)
gv and pritned appt schedule and avs for pt...gv pt barium...tpt aware cs will contact with d/t for ct.

## 2013-04-13 ENCOUNTER — Telehealth: Payer: Self-pay | Admitting: Oncology

## 2013-04-13 ENCOUNTER — Other Ambulatory Visit: Payer: Self-pay | Admitting: Nurse Practitioner

## 2013-04-13 ENCOUNTER — Telehealth: Payer: Self-pay | Admitting: *Deleted

## 2013-04-13 DIAGNOSIS — E876 Hypokalemia: Secondary | ICD-10-CM

## 2013-04-13 DIAGNOSIS — C7A021 Malignant carcinoid tumor of the cecum: Secondary | ICD-10-CM

## 2013-04-13 MED ORDER — POTASSIUM CHLORIDE CRYS ER 20 MEQ PO TBCR: 20.0000 meq | EXTENDED_RELEASE_TABLET | Freq: Every day | ORAL | Status: DC

## 2013-04-13 NOTE — Telephone Encounter (Signed)
lvm for pt regarding to 5.1.14 lab....mailed pt appt sched and letter

## 2013-04-13 NOTE — Telephone Encounter (Signed)
Left message on voicemail with instructions to increase Potassium to twice daily for three days then once daily. Requested pt return call to confirm instructions.

## 2013-04-13 NOTE — Telephone Encounter (Signed)
Pt returned call, instructed him to begin Potassium BID for 3 days then once daily. He voiced understanding and knows to expect cal from scheduler to repeat lab in one week.

## 2013-04-13 NOTE — Telephone Encounter (Signed)
Message copied by Caleb Popp on Thu Apr 13, 2013 10:12 AM ------      Message from: [place], [place] K      Created: Thu Apr 13, 2013  9:02 AM       Low potassium probably due to frequent loose stools. Please have him take Kdur twice daily for 3 days then decrease to daily. Repeat Bmet in one week (order entered and POF sent). Thanks.       ----- Message -----         From: [facility]         Sent: 04/11/2013  11:07 AM           To: Rana Snare, NP                   ------

## 2013-04-20 ENCOUNTER — Telehealth: Payer: Self-pay | Admitting: *Deleted

## 2013-04-20 ENCOUNTER — Other Ambulatory Visit (HOSPITAL_BASED_OUTPATIENT_CLINIC_OR_DEPARTMENT_OTHER): Payer: 59

## 2013-04-20 DIAGNOSIS — D649 Anemia, unspecified: Secondary | ICD-10-CM

## 2013-04-20 DIAGNOSIS — E876 Hypokalemia: Secondary | ICD-10-CM

## 2013-04-20 LAB — BASIC METABOLIC PANEL (CC13)
BUN: 7.5 mg/dL (ref 7.0–26.0)
Calcium: 9.3 mg/dL (ref 8.4–10.4)
Creatinine: 0.8 mg/dL (ref 0.7–1.3)

## 2013-04-20 NOTE — Telephone Encounter (Signed)
Left VM with stable chromagranin level

## 2013-04-20 NOTE — Telephone Encounter (Signed)
Message copied by Wandalee Ferdinand on Thu Apr 20, 2013  5:20 PM ------      Message from: Ladene Artist      Created: Tue Apr 18, 2013  8:53 PM       Please call patient, chromagranin is stable ------

## 2013-04-21 ENCOUNTER — Telehealth: Payer: Self-pay | Admitting: Nurse Practitioner

## 2013-04-21 NOTE — Telephone Encounter (Signed)
Notified Tommy Conley of potassium 3.5 on labs done 04/20/2013. He was instructed to continue K-Dur 20 milliequivalents daily.

## 2013-04-24 ENCOUNTER — Ambulatory Visit (INDEPENDENT_AMBULATORY_CARE_PROVIDER_SITE_OTHER): Payer: 59 | Admitting: Family Medicine

## 2013-04-24 ENCOUNTER — Encounter: Payer: Self-pay | Admitting: Family Medicine

## 2013-04-24 VITALS — BP 148/98 | HR 70 | Wt 226.0 lb

## 2013-04-24 DIAGNOSIS — I10 Essential (primary) hypertension: Secondary | ICD-10-CM

## 2013-04-24 DIAGNOSIS — Z125 Encounter for screening for malignant neoplasm of prostate: Secondary | ICD-10-CM

## 2013-04-24 DIAGNOSIS — K219 Gastro-esophageal reflux disease without esophagitis: Secondary | ICD-10-CM | POA: Insufficient documentation

## 2013-04-24 DIAGNOSIS — Z79899 Other long term (current) drug therapy: Secondary | ICD-10-CM

## 2013-04-24 DIAGNOSIS — N529 Male erectile dysfunction, unspecified: Secondary | ICD-10-CM | POA: Insufficient documentation

## 2013-04-24 MED ORDER — AMLODIPINE BESYLATE 10 MG PO TABS: 10.0000 mg | ORAL_TABLET | Freq: Every morning | ORAL | Status: DC

## 2013-04-24 MED ORDER — SILDENAFIL CITRATE 100 MG PO TABS: 100.0000 mg | ORAL_TABLET | Freq: Every day | ORAL | Status: DC | PRN

## 2013-04-24 MED ORDER — LOSARTAN POTASSIUM 50 MG PO TABS: 50.0000 mg | ORAL_TABLET | Freq: Every evening | ORAL | Status: DC

## 2013-04-24 NOTE — Progress Notes (Signed)
  Subjective:    Patient ID: Tommy Conley, male    DOB: 1951/07/12, 62 y.o.   MRN: [id number]  Hypertension This is a chronic problem. The current episode started more than 1 year ago. The problem is unchanged. The problem is controlled. Pertinent negatives include no anxiety, blurred vision or malaise/fatigue. There are no associated agents to hypertension. Past treatments include calcium channel blockers and angiotensin blockers. The current treatment provides significant improvement. There are no compliance problems.  Hypertensive end-organ damage includes CVA. There is no history of angina. There is no history of chronic renal disease.   patient notes ongoing difficulty with erectile dysfunction. Viagra definitely helped in the past.  Exercises nearly daily. Reflux is stable. On no meds currently   Review of Systems  Constitutional: Negative for malaise/fatigue.  Eyes: Negative for blurred vision.   ROS otherwise negative.     Objective:   Physical Exam  Alert no acute distress. HEENT normal. Lungs clear. Heart regular rate and rhythm. Abdomen benign. Ankles without edema.      Assessment & Plan:  Impression 1 hypertension good control. #2 reflux discussed stable off medicines. #3 erectile dysfunction ongoing. Viagra helped. Plan medications refilled. Diet exercise discussed. Appropriate blood work. Check every 6 months.

## 2013-05-19 LAB — HEPATIC FUNCTION PANEL
AST: 22 U/L (ref 0–37)
Albumin: 4.6 g/dL (ref 3.5–5.2)
Alkaline Phosphatase: 65 U/L (ref 39–117)
Indirect Bilirubin: 0.6 mg/dL (ref 0.0–0.9)
Total Bilirubin: 0.8 mg/dL (ref 0.3–1.2)
Total Protein: 6.7 g/dL (ref 6.0–8.3)

## 2013-05-19 LAB — LIPID PANEL
HDL: 42 mg/dL (ref >39)
LDL Cholesterol: 78 mg/dL (ref 0–99)
Triglycerides: 70 mg/dL (ref <150)

## 2013-05-28 ENCOUNTER — Encounter: Payer: Self-pay | Admitting: Family Medicine

## 2013-06-09 NOTE — Telephone Encounter (Signed)
e

## 2013-07-03 ENCOUNTER — Other Ambulatory Visit: Payer: Self-pay | Admitting: Oncology

## 2013-07-03 DIAGNOSIS — C7A021 Malignant carcinoid tumor of the cecum: Secondary | ICD-10-CM

## 2013-07-07 ENCOUNTER — Other Ambulatory Visit: Payer: Self-pay | Admitting: Family Medicine

## 2013-07-28 ENCOUNTER — Telehealth: Payer: Self-pay | Admitting: Oncology

## 2013-07-28 NOTE — Telephone Encounter (Signed)
lvm for pt regarding to est appt bing move to sept....meilad pt appt sched and letter

## 2013-08-08 ENCOUNTER — Other Ambulatory Visit (HOSPITAL_COMMUNITY): Payer: Self-pay | Admitting: Interventional Radiology

## 2013-08-08 DIAGNOSIS — C787 Secondary malignant neoplasm of liver and intrahepatic bile duct: Secondary | ICD-10-CM

## 2013-08-11 ENCOUNTER — Other Ambulatory Visit (HOSPITAL_BASED_OUTPATIENT_CLINIC_OR_DEPARTMENT_OTHER): Payer: 59 | Admitting: Lab

## 2013-08-11 ENCOUNTER — Encounter (HOSPITAL_COMMUNITY): Payer: Self-pay

## 2013-08-11 ENCOUNTER — Ambulatory Visit (HOSPITAL_COMMUNITY)
Admission: RE | Admit: 2013-08-11 | Discharge: 2013-08-11 | Disposition: A | Discharge status: Home / Self Care | Payer: 59 | Source: Ambulatory Visit | Attending: Nurse Practitioner | Admitting: Nurse Practitioner

## 2013-08-11 DIAGNOSIS — C7A021 Malignant carcinoid tumor of the cecum: Secondary | ICD-10-CM

## 2013-08-11 DIAGNOSIS — K7689 Other specified diseases of liver: Secondary | ICD-10-CM | POA: Insufficient documentation

## 2013-08-11 LAB — CBC WITH DIFFERENTIAL/PLATELET
Basophils Absolute: 0 10*3/uL (ref 0.0–0.1)
EOS%: 1.7 % (ref 0.0–7.0)
Eosinophils Absolute: 0.1 10*3/uL (ref 0.0–0.5)
HCT: 42 % (ref 38.4–49.9)
HGB: 13.8 g/dL (ref 13.0–17.1)
MCH: 27.3 pg (ref 27.2–33.4)
MCV: 82.9 fL (ref 79.3–98.0)
MONO%: 10.8 % (ref 0.0–14.0)
NEUT#: 4.5 10*3/uL (ref 1.5–6.5)
NEUT%: 64.7 % (ref 39.0–75.0)
Platelets: 209 10*3/uL (ref 140–400)

## 2013-08-11 LAB — BASIC METABOLIC PANEL (CC13)
BUN: 8.3 mg/dL (ref 7.0–26.0)
CO2: 24 mEq/L (ref 22–29)
Calcium: 9.6 mg/dL (ref 8.4–10.4)
Glucose: 83 mg/dl (ref 70–140)
Sodium: 140 mEq/L (ref 136–145)

## 2013-08-11 MED ORDER — IOHEXOL 300 MG/ML  SOLN
100.0000 mL | Freq: Once | INTRAMUSCULAR | Status: AC | PRN
  Administered 2013-08-11: 100 mL via INTRAVENOUS

## 2013-08-18 ENCOUNTER — Telehealth: Payer: Self-pay | Admitting: *Deleted

## 2013-08-18 ENCOUNTER — Ambulatory Visit: Payer: 59 | Admitting: Oncology

## 2013-08-18 NOTE — Telephone Encounter (Signed)
Patient notified. Confirmed 08/28/13 appointment.

## 2013-08-18 NOTE — Telephone Encounter (Signed)
Message copied by Wandalee Ferdinand on Fri Aug 18, 2013  1:39 PM ------      Message from: [name] B      Created: Thu Aug 17, 2013  9:43 PM       Please call patient, some of the liver lesions are slightly larger, no evidence of new metastatic disease            F/u as scheduled to discuss ------

## 2013-08-28 ENCOUNTER — Telehealth: Payer: Self-pay | Admitting: Oncology

## 2013-08-28 ENCOUNTER — Ambulatory Visit (HOSPITAL_BASED_OUTPATIENT_CLINIC_OR_DEPARTMENT_OTHER): Payer: 59 | Admitting: Oncology

## 2013-08-28 VITALS — BP 136/96 | HR 97 | Temp 97.0°F | Resp 18 | Ht 73.0 in | Wt 221.8 lb

## 2013-08-28 DIAGNOSIS — C7A021 Malignant carcinoid tumor of the cecum: Secondary | ICD-10-CM

## 2013-08-28 DIAGNOSIS — C7A012 Malignant carcinoid tumor of the ileum: Secondary | ICD-10-CM

## 2013-08-28 DIAGNOSIS — I10 Essential (primary) hypertension: Secondary | ICD-10-CM

## 2013-08-28 DIAGNOSIS — C787 Secondary malignant neoplasm of liver and intrahepatic bile duct: Secondary | ICD-10-CM

## 2013-08-28 NOTE — Telephone Encounter (Signed)
Gave pt appt for lab and ML on December 2014  °

## 2013-08-28 NOTE — Progress Notes (Signed)
   [facility]    OFFICE PROGRESS NOTE   INTERVAL HISTORY:   He returns as scheduled. He feels well. Good appetite and energy level. No diarrhea. No complaint.  Objective:  Vital signs in last 24 hours:  Blood pressure 136/96, pulse 97, temperature 97 F (36.1 C), temperature source Oral, resp. rate 18, height 6\' 1"  (1.854 m), weight 221 lb 12.8 oz (100.608 kg).    HEENT: Neck without mass Lymphatics: No cervical or supraclavicular nodes Resp: Lungs clear bilaterally Cardio: Regular rate and rhythm, 2/6 systolic murmur at the lower left sternal border GI: No hepatomegaly, nontender, no mass Vascular: No leg edema   Lab Results:  Lab Results  Component Value Date   WBC 6.9 08/11/2013   HGB 13.8 08/11/2013   HCT 42.0 08/11/2013   MCV 82.9 08/11/2013   PLT 209 08/11/2013   ANC 4.5  Chromogranin A-192  X-rays: CT abdomen 08/11/2013, compared to 12/05/2012-dominant lesion in the posterior right hepatic lobe is larger now measuring 11.5 x 10.6 cm compared to 10.5 x 8.8 cm. Other lesions are slightly larger. Soft tissue nodule in the small bowel mesentery is stable.   Medications: I have reviewed the patient's current medications.  Assessment/Plan: 1. 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  a. Status post a right colon/ileum resection on 09/29/2011.  b. Wedge biopsy of a right liver lesion on 09/29/2011 confirmed metastatic well-differentiated neuroendocrine carcinoma.  c. CT of the abdomen 08/25/2011 consistent with multiple liver metastases.  d. Elevated preoperative chromogranin A level and a 24-hour urine 5-HIAA level. Stable on repeat 11/16/2011. e. Restaging CT on 12/28/2011 confirmed slight progression of the metastatic liver lesions. f. Status post 3 cycles of Xeloda/temozolomide with cycle 3 initiated on 03/12/2012 g. Restaging CT of the abdomen 04/04/2012 revealed  slight progression of the metastatic liver lesions and transverse mesocolon adenopathy. h. Status post Y-90 radioembolization of the right liver on 05/19/2012. i. Status post Y-90 radioembolization of the left liver on 07/06/2012. j. Chromogranin A level decreased on 08/29/2012, 24-hour urine 5 HIAA slightly increased on 09/05/2012 k. Restaging CT on 12/05/2012 with a mixed response in the liver l. Restaging CT 08/11/2013-mild increase in the size of liver lesions 2. History of diarrhea (3 to 4 times per day), predating surgery for 8 months. Decrease in daily bowel movements coinciding with discontinuation of Nexium. 3. Hypertension. 4. History of gastroesophageal reflux disease. No reflux symptoms since discontinuing Nexium. 5. Rectal and ascending colon polyps. He underwent a colonoscopy on 12/20/2012 with findings of a sessile polyp in the ascending colon and a sessile polyp in the rectum. Pathology showed hyperplastic polyps. Prior right colon resection noted with a normal appearing ileocolonic anastomosis. Small internal hemorrhoids noted. Repeat colonoscopy in 5 years. 6. History of Iron deficiency anemia-improved.    Disposition:  He appears stable. The chromogranin A level is slightly higher and the liver lesions are slightly larger. He appears asymptomatic from the carcinoid tumor. We discussed treatment options. Mr. Fife is comfortable with observation approach. He will return for an office visit and chromogranin A level in 3 months. We will plan a restaging CT at a 6-9 month interval.  There is a lack of effective systemic treatment agents for metastatic carcinoid disease. We can consider repeat treatment with radio embolization if he develops more rapid progression or new symptoms.   [name], MD  08/28/2013  5:55 PM

## 2013-08-29 ENCOUNTER — Ambulatory Visit
Admission: RE | Admit: 2013-08-29 | Discharge: 2013-08-29 | Disposition: A | Discharge status: Home / Self Care | Payer: 59 | Source: Ambulatory Visit | Attending: Interventional Radiology | Admitting: Interventional Radiology

## 2013-08-29 DIAGNOSIS — C787 Secondary malignant neoplasm of liver and intrahepatic bile duct: Secondary | ICD-10-CM

## 2013-08-29 NOTE — Progress Notes (Signed)
Patient here for one-year follow-up to Y90 procedure.  He is without complaint at present.  jkl

## 2013-09-07 ENCOUNTER — Ambulatory Visit (INDEPENDENT_AMBULATORY_CARE_PROVIDER_SITE_OTHER): Payer: 59 | Admitting: General Surgery

## 2013-09-07 ENCOUNTER — Encounter (INDEPENDENT_AMBULATORY_CARE_PROVIDER_SITE_OTHER): Payer: Self-pay | Admitting: General Surgery

## 2013-09-07 VITALS — BP 118/83 | HR 77 | Temp 97.4°F | Resp 14 | Ht 73.0 in | Wt 218.2 lb

## 2013-09-07 DIAGNOSIS — C7A021 Malignant carcinoid tumor of the cecum: Secondary | ICD-10-CM

## 2013-09-07 NOTE — Patient Instructions (Signed)
Your physical exam today is normal. I cannot feel any evidence of cancer, and I do not feel any hernias in your abdomen.  Her CT scan shows that the carcinoid tumors in your liver are stable, and the Enlarged lymph node in your abdomen has not changed either.  There is no indication for any further surgery, and hopefully there never will be.  Keep your regular followup appointments with Dr. Truett Perna.

## 2013-09-07 NOTE — Progress Notes (Signed)
Patient ID: Tommy Conley, male   DOB: December 24, 1950, 62 y.o.   MRN: [id number] History: DRAEDEN KELLMAN is a 62 y.o. male. He returns for long-term followup regarding his metastatic carcinoid tumor.  This gentleman presented with primary carcinoid tumor of the ileocecal area and extensive liver metastasis.  On 09/29/2011 he underwent an ileo-right colectomy and liver biopsy. He recovered from that surgery uneventfully.  He continues frequent followup with Dr. Truett Perna and has undergone chemotherapy with Xeloda/temozolomide as will as Y-90 infusion to right and left hepatic lobes.  Symptomatically he is doing well. His weight is stable. He's tolerating diet without cramps or nausea. He doesn't really have the diarrhea that he had preop, Although he occasionally will have loose stools. He has 2-3 bowel movements per day. No diaphoresis, no flushing. He has no abdominal pain.  His appetite has been excellent, he has actually gained another wait for his PCP to urge him to lose weight. Chromogranin levels are elevated. Liver function tests are normal. Recent CT scan shows stable liver lesions and stable small bowel mesenteric lymph node. I discussed this with the patient Colonoscopy in December, 2013 showed what looked good. Some hyperplastic polyps were biopsied.  ROS: 10 system review of systems is negative except as described above.  Exam: Constitutional: He is oriented to person, place, and time. He appears well-developed and well-nourished. No distress.  HENT:  Head: Normocephalic. . Eyes: Conjunctivae and EOM are normal. Pupils are equal, round, and reactive to light. Right eye exhibits no discharge. Left eye exhibits no discharge. No scleral icterus.  Neck: Normal range of motion. Neck supple. No JVD present. No tracheal deviation present. No thyromegaly present.  Cardiovascular: Normal rate, regular rhythm, normal heart sounds and intact distal pulses.  No murmur heard.  Pulmonary/Chest:  Effort normal and breath sounds normal. No stridor. No respiratory distress. He has no wheezes. He has no rales. He exhibits no tenderness.  No axillary adenopathy  Abdominal: Soft. Bowel sounds are normal. He exhibits no distension and no mass. There is no tenderness. There is no rebound and no guarding.  Midline scar well-healed. No hernia. No palpable mass. Liver not palpable.  Genitourinary:  No inguinal adenopathy  Musculoskeletal: Normal range of motion. He exhibits no edema and no tenderness.  Lymphadenopathy:  He has no cervical adenopathy.  Neurological: He is alert and oriented to person, place, and time. He has normal reflexes. Coordination normal. r.  Psychiatric: He has a normal mood and affect. His behavior is normal. Judgment and thought content normal.   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 Status post ileo-right colectomy, 09/29/2011 well. No apparent surgical complications.  Normal gastrointestinal function without obstructive symptoms and with no clinical evidence of carcinoid syndrome, despite extensive liver metastasis.  Plan: Continue regular follow with Dr. Truett Perna Periodic colonoscopy is recommended Return to see me when necessary    [facility] Derrell Lolling, M.D., [facility] General and Minimally invasive Surgery Breast and Colorectal Surgery Office:   [phone] Pager:   [phone]

## 2013-09-26 ENCOUNTER — Ambulatory Visit (INDEPENDENT_AMBULATORY_CARE_PROVIDER_SITE_OTHER): Payer: 59 | Admitting: Family Medicine

## 2013-09-26 ENCOUNTER — Encounter: Payer: Self-pay | Admitting: Family Medicine

## 2013-09-26 VITALS — BP 112/72 | Ht 73.0 in | Wt 215.0 lb

## 2013-09-26 DIAGNOSIS — Z23 Encounter for immunization: Secondary | ICD-10-CM

## 2013-09-26 DIAGNOSIS — K219 Gastro-esophageal reflux disease without esophagitis: Secondary | ICD-10-CM

## 2013-09-26 DIAGNOSIS — N529 Male erectile dysfunction, unspecified: Secondary | ICD-10-CM

## 2013-09-26 DIAGNOSIS — C7A021 Malignant carcinoid tumor of the cecum: Secondary | ICD-10-CM

## 2013-09-26 DIAGNOSIS — I10 Essential (primary) hypertension: Secondary | ICD-10-CM

## 2013-09-26 NOTE — Progress Notes (Signed)
  Subjective:    Patient ID: Tommy Conley, male    DOB: May 19, 1951, 62 y.o.   MRN: [id number]  HPI Patient is here today for 6 month f/u.  He is concerned about both hands cramping up. This episode happened to him a month ago when he was killing a snake. Took a rake and struck hard, cramping then  Also wants the flu vaccine.  Exercise good. Watching diet. Often picks up weight with hands,   Diagnosed with carcinoid tumor. Unfortunately it has metastasized considerably in his liver. He is followed by the oncologist and the surgeon. The last time he saw me told him it was actually worse.  Reports reflux is overall stable.  Claims compliance with blood pressure medicine. Trying to watch salt intake. Exercising some.   Review of Systems No chest pain no back pain no abdominal pain no change in bowel habits no fever no chills ROS otherwise negative    Objective:   Physical Exam  Alert HEENT normal. Lungs clear. Heart rare rhythm. Arms and hands good grip good pulses no particular muscle tenderness ankles without edema abdominal exam benign.      Assessment & Plan:  Impression 1 hypertension good control discussed #2 metastatic cancer stable discussed. #3 erectile dysfunction ongoing discussed. #4 reflux clinically stable discussed plan flu shot today. Maintain medications diet exercise discussed check every 6 months. Blood work from last visit reviewed. WSL

## 2013-09-26 NOTE — Patient Instructions (Signed)
Keep exercising  Consider at least holding off on overtime

## 2013-11-02 ENCOUNTER — Other Ambulatory Visit: Payer: Self-pay | Admitting: Family Medicine

## 2013-11-02 ENCOUNTER — Other Ambulatory Visit: Payer: Self-pay | Admitting: Oncology

## 2013-11-02 DIAGNOSIS — C7A021 Malignant carcinoid tumor of the cecum: Secondary | ICD-10-CM

## 2013-11-05 ENCOUNTER — Other Ambulatory Visit: Payer: Self-pay | Admitting: Family Medicine

## 2013-11-06 ENCOUNTER — Other Ambulatory Visit: Payer: Self-pay | Admitting: Oncology

## 2013-11-24 ENCOUNTER — Other Ambulatory Visit (HOSPITAL_BASED_OUTPATIENT_CLINIC_OR_DEPARTMENT_OTHER): Payer: 59 | Admitting: Lab

## 2013-11-24 DIAGNOSIS — C7A012 Malignant carcinoid tumor of the ileum: Secondary | ICD-10-CM

## 2013-11-24 DIAGNOSIS — C7A021 Malignant carcinoid tumor of the cecum: Secondary | ICD-10-CM

## 2013-11-27 ENCOUNTER — Telehealth: Payer: Self-pay | Admitting: Oncology

## 2013-11-27 ENCOUNTER — Ambulatory Visit (HOSPITAL_BASED_OUTPATIENT_CLINIC_OR_DEPARTMENT_OTHER): Payer: 59 | Admitting: Nurse Practitioner

## 2013-11-27 VITALS — BP 138/87 | HR 85 | Temp 97.0°F | Resp 20 | Ht 73.0 in | Wt 220.1 lb

## 2013-11-27 DIAGNOSIS — C7A021 Malignant carcinoid tumor of the cecum: Secondary | ICD-10-CM

## 2013-11-27 DIAGNOSIS — I10 Essential (primary) hypertension: Secondary | ICD-10-CM

## 2013-11-27 DIAGNOSIS — C7A012 Malignant carcinoid tumor of the ileum: Secondary | ICD-10-CM

## 2013-11-27 DIAGNOSIS — C787 Secondary malignant neoplasm of liver and intrahepatic bile duct: Secondary | ICD-10-CM

## 2013-11-27 MED ORDER — MAGIC MOUTHWASH: 10.0000 mL | Freq: Three times a day (TID) | ORAL | Status: DC | PRN

## 2013-11-27 NOTE — Progress Notes (Signed)
OFFICE PROGRESS NOTE  Interval history:  Tommy Conley returns for followup of metastatic carcinoid. He denies abdominal pain. He continues to have frequent bowel movements estimating 3 per day. He denies diarrhea. About 6 weeks ago he had an episode of nausea/vomiting. He denies any flushing. He has a good appetite. He has a good energy level.   Objective: Blood pressure 138/87, pulse 85, temperature 97 F (36.1 C), temperature source Oral, resp. rate 20, height 6\' 1"  (1.854 m), weight 220 lb 1.6 oz (99.837 kg).  No thrush or ulcerations. No palpable cervical, supraclavicular or axillary lymph nodes. Rales at both lung bases. No respiratory distress. Regular cardiac rhythm. Abdomen soft and nontender. No hepatomegaly. No mass. No leg edema.  Lab Results: Lab Results  Component Value Date   WBC 6.9 08/11/2013   HGB 13.8 08/11/2013   HCT 42.0 08/11/2013   MCV 82.9 08/11/2013   PLT 209 08/11/2013    Chemistry:    Chemistry      Component Value Date/Time   NA 140 08/11/2013 1049   NA 142 07/27/2012 0905   NA 141 04/04/2012 1106   K 4.3 08/11/2013 1049   K 3.9 07/27/2012 0905   K 3.5 04/04/2012 1106   CL 107 04/20/2013 1302   CL 107 07/27/2012 0905   CL 103 04/04/2012 1106   CO2 24 08/11/2013 1049   CO2 24 07/27/2012 0905   CO2 28 04/04/2012 1106   BUN 8.3 08/11/2013 1049   BUN 10 07/27/2012 0905   BUN 8 04/04/2012 1106   CREATININE 0.9 08/11/2013 1049   CREATININE 0.98 07/27/2012 0905   CREATININE 0.9 04/04/2012 1106      Component Value Date/Time   CALCIUM 9.6 08/11/2013 1049   CALCIUM 9.7 07/27/2012 0905   CALCIUM 8.8 04/04/2012 1106   ALKPHOS 65 04/24/2013 1040   ALKPHOS 85 08/29/2012 0921   ALKPHOS 70 04/04/2012 1106   AST 22 04/24/2013 1040   AST 21 08/29/2012 0921   AST 21 04/04/2012 1106   ALT 17 04/24/2013 1040   ALT 25 08/29/2012 0921   ALT 25 04/04/2012 1106   BILITOT 0.8 04/24/2013 1040   BILITOT 0.30 08/29/2012 0921   BILITOT 0.50 04/04/2012 1106       Studies/Results: No results  found.  Medications: I have reviewed the patient's current medications.  Assessment/Plan:  1. 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  a. Status post a right colon/ileum resection on 09/29/2011.  b. Wedge biopsy of a right liver lesion on 09/29/2011 confirmed metastatic well-differentiated neuroendocrine carcinoma.  c. CT of the abdomen 08/25/2011 consistent with multiple liver metastases.  d. Elevated preoperative chromogranin A level and a 24-hour urine 5-HIAA level. Stable on repeat 11/16/2011. e. Restaging CT on 12/28/2011 confirmed slight progression of the metastatic liver lesions. f. Status post 3 cycles of Xeloda/temozolomide with cycle 3 initiated on 03/12/2012 g. Restaging CT of the abdomen 04/04/2012 revealed slight progression of the metastatic liver lesions and transverse mesocolon adenopathy. h. Status post Y-90 radioembolization of the right liver on 05/19/2012. i. Status post Y-90 radioembolization of the left liver on 07/06/2012. j. Chromogranin A level decreased on 08/29/2012, 24-hour urine 5 HIAA slightly increased on 09/05/2012 k. Restaging CT on 12/05/2012 with a mixed response in the liver l. Restaging CT 08/11/2013-mild increase in the size of liver lesions. 2. History of diarrhea (3 to 4 times per day), predating surgery for 8 months. Decrease in daily bowel movements coinciding  with discontinuation of Nexium. 3. Hypertension. 4. History of gastroesophageal reflux disease. No reflux symptoms since discontinuing Nexium. 5. Rectal and ascending colon polyps. He underwent a colonoscopy on 12/20/2012 with findings of a sessile polyp in the ascending colon and a sessile polyp in the rectum. Pathology showed hyperplastic polyps. Prior right colon resection noted with a normal appearing ileocolonic anastomosis. Small internal hemorrhoids noted. Repeat colonoscopy in 5 years. 6. History of Iron  deficiency anemia-improved.  Disposition-he appears stable. He remains asymptomatic from the carcinoid tumor. We will followup on the chromogranin A level from today. Dr. Truett Perna recommends restaging CT scan at a six-month interval from the previous. We will schedule for mid February 2015. He will return for a followup visit approximately 1 week after the scan to review the results. He will contact the office in the interim with any problems.  Plan reviewed with Dr. Derenda Fennel, Misty Stanley ANP/GNP-BC

## 2013-11-27 NOTE — Telephone Encounter (Signed)
appts made per 12/8 POF Barium given AVS and CAL printed shh

## 2013-12-01 ENCOUNTER — Telehealth: Payer: Self-pay | Admitting: *Deleted

## 2013-12-01 ENCOUNTER — Other Ambulatory Visit: Payer: Self-pay | Admitting: Family Medicine

## 2013-12-01 NOTE — Telephone Encounter (Signed)
Message copied by Caleb Popp on Fri Dec 01, 2013  9:54 AM ------      Message from: [name] B      Created: Thu Nov 30, 2013  8:00 PM       Please call patient, chromogranin A is stable ------

## 2013-12-01 NOTE — Telephone Encounter (Signed)
Called pt with stable Chromagranin results per Dr. Truett Perna. Pt voiced understanding.

## 2013-12-28 ENCOUNTER — Other Ambulatory Visit: Payer: Self-pay | Admitting: Oncology

## 2013-12-30 ENCOUNTER — Other Ambulatory Visit: Payer: Self-pay | Admitting: Family Medicine

## 2013-12-30 ENCOUNTER — Other Ambulatory Visit: Payer: Self-pay | Admitting: Oncology

## 2013-12-30 DIAGNOSIS — C7A021 Malignant carcinoid tumor of the cecum: Secondary | ICD-10-CM

## 2014-01-22 ENCOUNTER — Other Ambulatory Visit: Payer: 59 | Admitting: Lab

## 2014-01-22 ENCOUNTER — Encounter (HOSPITAL_COMMUNITY): Payer: Self-pay

## 2014-01-22 ENCOUNTER — Other Ambulatory Visit (HOSPITAL_BASED_OUTPATIENT_CLINIC_OR_DEPARTMENT_OTHER): Payer: 59

## 2014-01-22 ENCOUNTER — Ambulatory Visit (HOSPITAL_COMMUNITY)
Admission: RE | Admit: 2014-01-22 | Discharge: 2014-01-22 | Disposition: A | Discharge status: Home / Self Care | Payer: 59 | Source: Ambulatory Visit | Attending: Nurse Practitioner | Admitting: Nurse Practitioner

## 2014-01-22 DIAGNOSIS — C7A029 Malignant carcinoid tumor of the large intestine, unspecified portion: Secondary | ICD-10-CM | POA: Insufficient documentation

## 2014-01-22 DIAGNOSIS — Z9221 Personal history of antineoplastic chemotherapy: Secondary | ICD-10-CM | POA: Insufficient documentation

## 2014-01-22 DIAGNOSIS — Z9049 Acquired absence of other specified parts of digestive tract: Secondary | ICD-10-CM | POA: Insufficient documentation

## 2014-01-22 DIAGNOSIS — C787 Secondary malignant neoplasm of liver and intrahepatic bile duct: Secondary | ICD-10-CM

## 2014-01-22 DIAGNOSIS — C7B8 Other secondary neuroendocrine tumors: Secondary | ICD-10-CM | POA: Insufficient documentation

## 2014-01-22 DIAGNOSIS — K769 Liver disease, unspecified: Secondary | ICD-10-CM | POA: Insufficient documentation

## 2014-01-22 DIAGNOSIS — C7A021 Malignant carcinoid tumor of the cecum: Secondary | ICD-10-CM

## 2014-01-22 DIAGNOSIS — C7A012 Malignant carcinoid tumor of the ileum: Secondary | ICD-10-CM

## 2014-01-22 LAB — COMPREHENSIVE METABOLIC PANEL (CC13)
ALK PHOS: 83 U/L (ref 40–150)
ALT: 17 U/L (ref 0–55)
AST: 18 U/L (ref 5–34)
Albumin: 3.8 g/dL (ref 3.5–5.0)
Anion Gap: 10 mEq/L (ref 3–11)
BUN: 11.6 mg/dL (ref 7.0–26.0)
CO2: 27 meq/L (ref 22–29)
Calcium: 9.5 mg/dL (ref 8.4–10.4)
Chloride: 105 mEq/L (ref 98–109)
Creatinine: 0.9 mg/dL (ref 0.7–1.3)
Glucose: 89 mg/dl (ref 70–140)
Potassium: 3.5 mEq/L (ref 3.5–5.1)
SODIUM: 142 meq/L (ref 136–145)
Total Bilirubin: 0.35 mg/dL (ref 0.20–1.20)
Total Protein: 6.6 g/dL (ref 6.4–8.3)

## 2014-01-22 MED ORDER — IOHEXOL 300 MG/ML  SOLN
100.0000 mL | Freq: Once | INTRAMUSCULAR | Status: AC | PRN
  Administered 2014-01-22: 100 mL via INTRAVENOUS

## 2014-01-25 ENCOUNTER — Other Ambulatory Visit (HOSPITAL_COMMUNITY): Payer: Self-pay | Admitting: Interventional Radiology

## 2014-01-25 DIAGNOSIS — C787 Secondary malignant neoplasm of liver and intrahepatic bile duct: Secondary | ICD-10-CM

## 2014-01-25 LAB — CHROMOGRANIN A: Chromogranin A: 184 ng/mL — ABNORMAL HIGH (ref 1.9–15.0)

## 2014-02-12 ENCOUNTER — Ambulatory Visit (HOSPITAL_BASED_OUTPATIENT_CLINIC_OR_DEPARTMENT_OTHER): Payer: 59 | Admitting: Oncology

## 2014-02-12 ENCOUNTER — Encounter (INDEPENDENT_AMBULATORY_CARE_PROVIDER_SITE_OTHER): Payer: Self-pay

## 2014-02-12 VITALS — BP 141/91 | HR 91 | Temp 98.0°F | Resp 20 | Ht 73.0 in | Wt 221.4 lb

## 2014-02-12 DIAGNOSIS — C7A021 Malignant carcinoid tumor of the cecum: Secondary | ICD-10-CM

## 2014-02-12 DIAGNOSIS — C7A012 Malignant carcinoid tumor of the ileum: Secondary | ICD-10-CM

## 2014-02-12 DIAGNOSIS — C787 Secondary malignant neoplasm of liver and intrahepatic bile duct: Secondary | ICD-10-CM

## 2014-02-12 NOTE — Progress Notes (Signed)
   [place]    OFFICE PROGRESS NOTE   INTERVAL HISTORY:   Tommy Conley returns for scheduled followup of carcinoid tumor. He feels well. He is working. No fever or diarrhea. He has noted mild swelling at the bilateral ankle today. No leg pain.  Objective:  Vital signs in last 24 hours:  Blood pressure 141/91, pulse 91, temperature 98 F (36.7 C), temperature source Oral, resp. rate 20, height 6\' 1"  (1.854 m), weight 221 lb 6.4 oz (100.426 kg).    HEENT: Neck without mass Resp: Lungs clear bilaterally Cardio: Regular rate and rhythm GI: No hepatomegaly, nontender, no mass Vascular: Trace pitting edema at the ankle bilaterally    Lab Results:  01/22/2014-chromogranin A   184   X-rays: CT of the abdomen on 01/22/2014, compared to 08/11/2013: The dominant right liver mass is unchanged. An exophytic inferior right liver mass is smaller. A lateral segment left liver lesion is smaller. Soft tissue mass in the ileocolic mesentery is slightly larger. No new area of disease.  Medications: I have reviewed the patient's current medications.  Assessment/Plan: 1. 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  a. Status post a right colon/ileum resection on 09/29/2011.  b. Wedge biopsy of a right liver lesion on 09/29/2011 confirmed metastatic well-differentiated neuroendocrine carcinoma.  c. CT of the abdomen 08/25/2011 consistent with multiple liver metastases.  d. Elevated preoperative chromogranin A level and a 24-hour urine 5-HIAA level. Stable on repeat 11/16/2011. e. Restaging CT on 12/28/2011 confirmed slight progression of the metastatic liver lesions. f. Status post 3 cycles of Xeloda/temozolomide with cycle 3 initiated on 03/12/2012 g. Restaging CT of the abdomen 04/04/2012 revealed slight progression of the metastatic liver lesions and transverse mesocolon adenopathy. h. Status post  Y-90 radioembolization of the right liver on 05/19/2012. i. Status post Y-90 radioembolization of the left liver on 07/06/2012. j. Chromogranin A level decreased on 08/29/2012, 24-hour urine 5 HIAA slightly increased on 09/05/2012 k. Restaging CT on 12/05/2012 with a mixed response in the liver l. Restaging CT 08/11/2013-mild increase in the size of liver lesions. m. Restaging CT 01/22/2014-stable to slightly improved liver lesions, mild increase in mesenteric adenopathy. Chromogranin A level stable. 2. History of diarrhea (3 to 4 times per day), predating surgery for 8 months. Decrease in daily bowel movements coinciding with discontinuation of Nexium. 3. Hypertension. 4. History of gastroesophageal reflux disease. No reflux symptoms since discontinuing Nexium. 5. Rectal and ascending colon polyps. He underwent a colonoscopy on 12/20/2012 with findings of a sessile polyp in the ascending colon and a sessile polyp in the rectum. Pathology showed hyperplastic polyps. Prior right colon resection noted with a normal appearing ileocolonic anastomosis. Small internal hemorrhoids noted. Repeat colonoscopy in 5 years. 6. History of Iron deficiency anemia-improved.  Disposition:  There is no clinical or radiologic evidence for progression of the carcinoid disease. I reviewed the restaging CT images with Tommy Conley. He will be scheduled for a repeat chromogranin A level and 24-hour urine 5 HIAA prior to an office visit in 3 months.   Betsy Coder, MD  02/12/2014  5:50 PM

## 2014-02-13 ENCOUNTER — Telehealth: Payer: Self-pay | Admitting: Oncology

## 2014-02-13 ENCOUNTER — Telehealth: Payer: Self-pay | Admitting: Emergency Medicine

## 2014-02-13 NOTE — Telephone Encounter (Signed)
  Lmovm for pt to make him aware that Dr Annamaria Boots reviewed his labs and ov note from Dr Benay Spice today and stated since CT is stable and his is asymptomatic, we do not need to set up f/u appt. Right now.   Pt to call us if any concerns, symptoms , or if Dr Benay Spice deems necessary.

## 2014-02-13 NOTE — Telephone Encounter (Signed)
lvm for pt regarding to May appts...mailed pt appt sched/avs and letter °

## 2014-02-25 ENCOUNTER — Other Ambulatory Visit: Payer: Self-pay | Admitting: Oncology

## 2014-03-29 ENCOUNTER — Other Ambulatory Visit: Payer: Self-pay | Admitting: Family Medicine

## 2014-05-01 ENCOUNTER — Other Ambulatory Visit (HOSPITAL_BASED_OUTPATIENT_CLINIC_OR_DEPARTMENT_OTHER): Payer: 59

## 2014-05-01 DIAGNOSIS — C7A012 Malignant carcinoid tumor of the ileum: Secondary | ICD-10-CM

## 2014-05-01 DIAGNOSIS — C7A021 Malignant carcinoid tumor of the cecum: Secondary | ICD-10-CM

## 2014-05-05 ENCOUNTER — Other Ambulatory Visit: Payer: Self-pay | Admitting: Oncology

## 2014-05-07 LAB — CHROMOGRANIN A: Chromogranin A: 214 ng/mL — ABNORMAL HIGH (ref 1.9–15.0)

## 2014-05-08 ENCOUNTER — Ambulatory Visit (HOSPITAL_BASED_OUTPATIENT_CLINIC_OR_DEPARTMENT_OTHER): Payer: 59 | Admitting: Nurse Practitioner

## 2014-05-08 ENCOUNTER — Other Ambulatory Visit: Payer: Self-pay

## 2014-05-08 VITALS — BP 137/99 | HR 80 | Temp 97.3°F | Resp 19 | Ht 73.0 in | Wt 216.7 lb

## 2014-05-08 DIAGNOSIS — C7A021 Malignant carcinoid tumor of the cecum: Secondary | ICD-10-CM

## 2014-05-08 DIAGNOSIS — C7B8 Other secondary neuroendocrine tumors: Secondary | ICD-10-CM

## 2014-05-08 DIAGNOSIS — C7A012 Malignant carcinoid tumor of the ileum: Secondary | ICD-10-CM

## 2014-05-08 DIAGNOSIS — C787 Secondary malignant neoplasm of liver and intrahepatic bile duct: Secondary | ICD-10-CM

## 2014-05-08 LAB — 5 HIAA, QUANTITATIVE, URINE, 24 HOUR
5-HIAA, 24 Hr Urine: 326.5 mg/24 h — ABNORMAL HIGH (ref <=6.0)
Volume, Urine-5HIAA: 800 mL/24 h

## 2014-05-08 NOTE — Progress Notes (Signed)
  [facility] [facility] OFFICE PROGRESS NOTE   Diagnosis:  Metastatic carcinoid.  INTERVAL HISTORY:   [name] returns as scheduled. He feels well. Good energy level. He denies pain. No diarrhea. No fever or flushing. He denies cough and shortness of breath. He has a good appetite.  Objective:  Vital signs in last 24 hours:  Blood pressure 137/99, pulse 80, temperature 97.3 F (36.3 C), temperature source Oral, resp. rate 19, height 6\' 1"  (1.854 m), weight 216 lb 11.2 oz (98.294 kg).    HEENT: No thrush or ulcerations. Resp: Lungs clear. Cardio: Regular cardiac rhythm. GI: Abdomen soft and nontender. No mass. No organomegaly. Vascular: No leg edema.     Lab Results:  Lab Results  Component Value Date   WBC 6.9 08/11/2013   HGB 13.8 08/11/2013   HCT 42.0 08/11/2013   MCV 82.9 08/11/2013   PLT 209 08/11/2013   NEUTROABS 4.5 08/11/2013    Imaging:  No results found.  Medications: I have reviewed the patient's current medications.  Assessment/Plan: 1. 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  a. Status post a right colon/ileum resection on 09/29/2011.  b. Wedge biopsy of a right liver lesion on 09/29/2011 confirmed metastatic well-differentiated neuroendocrine carcinoma.  c. CT of the abdomen 08/25/2011 consistent with multiple liver metastases.  d. Elevated preoperative chromogranin A level and a 24-hour urine 5-HIAA level. Stable on repeat 11/16/2011. e. Restaging CT on 12/28/2011 confirmed slight progression of the metastatic liver lesions. f. Status post 3 cycles of Xeloda/temozolomide with cycle 3 initiated on 03/12/2012 g. Restaging CT of the abdomen 04/04/2012 revealed slight progression of the metastatic liver lesions and transverse mesocolon adenopathy. h. Status post Y-90 radioembolization of the right liver on 05/19/2012. i. Status post Y-90 radioembolization of the left  liver on 07/06/2012. j. Chromogranin A level decreased on 08/29/2012, 24-hour urine 5 HIAA slightly increased on 09/05/2012 k. Restaging CT on 12/05/2012 with a mixed response in the liver l. Restaging CT 08/11/2013-mild increase in the size of liver lesions. m. Restaging CT 01/22/2014-stable to slightly improved liver lesions, mild increase in mesenteric adenopathy. Chromogranin A level stable. n. Chromogranin A level stable 05/01/2014. 2. History of diarrhea (3 to 4 times per day), predating surgery for 8 months. Decrease in daily bowel movements coinciding with discontinuation of Nexium. 3. Hypertension. 4. History of gastroesophageal reflux disease. No reflux symptoms since discontinuing Nexium. 5. Rectal and ascending colon polyps. He underwent a colonoscopy on 12/20/2012 with findings of a sessile polyp in the ascending colon and a sessile polyp in the rectum. Pathology showed hyperplastic polyps. Prior right colon resection noted with a normal appearing ileocolonic anastomosis. Small internal hemorrhoids noted. Repeat colonoscopy in 5 years. 6. History of Iron deficiency anemia-improved.   Disposition: He appears well. There is no clinical evidence for disease progression. We will followup on the 24-hour urine 5 HIAA result. He will return for a followup visit and chromogranin A level in 3 months. He will contact the office in the interim with any problems.  Plan reviewed with Dr. Benay Spice.    Owens Shark ANP/GNP-BC   05/08/2014  10:55 AM

## 2014-05-09 ENCOUNTER — Telehealth: Payer: Self-pay | Admitting: Oncology

## 2014-05-09 NOTE — Telephone Encounter (Signed)
gave pt appt for lab and Md for August and lab for tomorow instructed pt to get appt calendar

## 2014-05-10 ENCOUNTER — Other Ambulatory Visit (HOSPITAL_BASED_OUTPATIENT_CLINIC_OR_DEPARTMENT_OTHER): Payer: 59

## 2014-05-10 DIAGNOSIS — C7A021 Malignant carcinoid tumor of the cecum: Secondary | ICD-10-CM

## 2014-05-10 DIAGNOSIS — C7A012 Malignant carcinoid tumor of the ileum: Secondary | ICD-10-CM

## 2014-05-10 LAB — BASIC METABOLIC PANEL (CC13)
Anion Gap: 10 mEq/L (ref 3–11)
BUN: 8.6 mg/dL (ref 7.0–26.0)
CO2: 23 meq/L (ref 22–29)
Calcium: 9.2 mg/dL (ref 8.4–10.4)
Chloride: 110 mEq/L — ABNORMAL HIGH (ref 98–109)
Creatinine: 0.9 mg/dL (ref 0.7–1.3)
Glucose: 90 mg/dl (ref 70–140)
Potassium: 3.5 mEq/L (ref 3.5–5.1)
SODIUM: 143 meq/L (ref 136–145)

## 2014-05-17 ENCOUNTER — Other Ambulatory Visit: Payer: Self-pay | Admitting: Nurse Practitioner

## 2014-05-17 DIAGNOSIS — C7A021 Malignant carcinoid tumor of the cecum: Secondary | ICD-10-CM

## 2014-05-18 ENCOUNTER — Telehealth: Payer: Self-pay | Admitting: *Deleted

## 2014-05-18 NOTE — Telephone Encounter (Signed)
Called and informed patient that 24 hour urine 5 HIAA is higher and that Dr. Benay Spice wants to repeat at a three month intervall.  Per Dr. Benay Spice.  Patient verbalized understanding.

## 2014-05-18 NOTE — Telephone Encounter (Signed)
Left Message for patient to call back regarding lab results.   

## 2014-05-18 NOTE — Telephone Encounter (Signed)
Called patient with lab results and informed patient to continue taking Kdur 20 meq daily.  Per Elby Showers. Thomas. Patient verbalized understanding.

## 2014-05-18 NOTE — Telephone Encounter (Signed)
Message copied by Norma Fredrickson on Fri May 18, 2014  2:28 PM ------      Message from: [place], [place] K      Created: Thu May 17, 2014  4:54 PM       Please let him know potassium is in low normal range, continue Kdur 20 meq daily.       ----- Message -----         From: [facility]         Sent: 05/10/2014  10:49 AM           To: Owens Shark, NP                   ------

## 2014-05-18 NOTE — Telephone Encounter (Signed)
Message copied by Norma Fredrickson on Fri May 18, 2014  8:56 AM ------      Message from: [place], [place] K      Created: Thu May 17, 2014  4:55 PM       Please let him know 24 hour urine 5 HIAA was higher. Dr. Benay Spice wants to repeat at a three-month interval. Order and POF submitted.       ----- Message -----         From: Ladell Pier, MD         Sent: 05/09/2014   9:17 PM           To: Owens Shark, NP            Repeat with chromagran in 3 mo. And if higher again we will repeat CT      ----- Message -----         From: [facility]         Sent: 05/07/2014   4:56 PM           To: Ladell Pier, MD                         ------

## 2014-06-13 ENCOUNTER — Other Ambulatory Visit: Payer: Self-pay | Admitting: Nurse Practitioner

## 2014-06-13 DIAGNOSIS — C7A021 Malignant carcinoid tumor of the cecum: Secondary | ICD-10-CM

## 2014-06-26 ENCOUNTER — Other Ambulatory Visit: Payer: Self-pay | Admitting: Family Medicine

## 2014-08-02 ENCOUNTER — Other Ambulatory Visit (HOSPITAL_BASED_OUTPATIENT_CLINIC_OR_DEPARTMENT_OTHER): Payer: 59

## 2014-08-02 DIAGNOSIS — C7A021 Malignant carcinoid tumor of the cecum: Secondary | ICD-10-CM

## 2014-08-02 DIAGNOSIS — C7A012 Malignant carcinoid tumor of the ileum: Secondary | ICD-10-CM

## 2014-08-02 LAB — BASIC METABOLIC PANEL (CC13)
Anion Gap: 8 mEq/L (ref 3–11)
BUN: 8.3 mg/dL (ref 7.0–26.0)
CO2: 25 meq/L (ref 22–29)
Calcium: 9.6 mg/dL (ref 8.4–10.4)
Chloride: 107 mEq/L (ref 98–109)
Creatinine: 0.9 mg/dL (ref 0.7–1.3)
GLUCOSE: 85 mg/dL (ref 70–140)
Potassium: 3.8 mEq/L (ref 3.5–5.1)
SODIUM: 140 meq/L (ref 136–145)

## 2014-08-07 LAB — CHROMOGRANIN A: Chromogranin A: 266 ng/mL — ABNORMAL HIGH (ref <=15)

## 2014-08-09 ENCOUNTER — Telehealth: Payer: Self-pay | Admitting: Oncology

## 2014-08-09 ENCOUNTER — Ambulatory Visit (HOSPITAL_BASED_OUTPATIENT_CLINIC_OR_DEPARTMENT_OTHER): Payer: 59 | Admitting: Oncology

## 2014-08-09 VITALS — BP 133/94 | HR 80 | Temp 98.2°F | Resp 18 | Ht 73.0 in | Wt 208.4 lb

## 2014-08-09 DIAGNOSIS — C7A021 Malignant carcinoid tumor of the cecum: Secondary | ICD-10-CM

## 2014-08-09 DIAGNOSIS — C7A012 Malignant carcinoid tumor of the ileum: Secondary | ICD-10-CM

## 2014-08-09 NOTE — Telephone Encounter (Signed)
Pt confirmed labs/ov per 08/20 POF, gave pt AVS and Barium for CT test....KJ

## 2014-08-09 NOTE — Progress Notes (Signed)
  [facility] OFFICE PROGRESS NOTE   Diagnosis: Carcinoid tumor  INTERVAL HISTORY:   Tommy Conley returns as scheduled. He feels well. He is walking a mile daily for exercise. He relates weight loss to discontinuing sodas. No fever, sweats, or diarrhea. No other complaint.  Objective:  Vital signs in last 24 hours:  Blood pressure 133/94, pulse 80, temperature 98.2 F (36.8 C), temperature source Oral, resp. rate 18, height 6\' 1"  (1.854 m), weight 208 lb 6.4 oz (94.53 kg).    HEENT: Neck without mass Resp: Distant breath sounds, no respiratory distress, slight decrease in breath sounds at the right compared to the left lower chest Cardio: Regular rate and rhythm GI: No hepatomegaly, nontender, no mass Vascular: No leg edema  Lab Results:  Chromogranin A on 08/02/2014-266 24-hour urine 5 HIAA pending   Medications: I have reviewed the patient's current medications.  Assessment/Plan: 1. 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  a. Status post a right colon/ileum resection on 09/29/2011.  b. Wedge biopsy of a right liver lesion on 09/29/2011 confirmed metastatic well-differentiated neuroendocrine carcinoma.  c. CT of the abdomen 08/25/2011 consistent with multiple liver metastases.  d. Elevated preoperative chromogranin A level and a 24-hour urine 5-HIAA level. Stable on repeat 11/16/2011. e. Restaging CT on 12/28/2011 confirmed slight progression of the metastatic liver lesions. f. Status post 3 cycles of Xeloda/temozolomide with cycle 3 initiated on 03/12/2012 g. Restaging CT of the abdomen 04/04/2012 revealed slight progression of the metastatic liver lesions and transverse mesocolon adenopathy. h. Status post Y-90 radioembolization of the right liver on 05/19/2012. i. Status post Y-90 radioembolization of the left liver on 07/06/2012. j. Chromogranin A level decreased on  08/29/2012, 24-hour urine 5 HIAA slightly increased on 09/05/2012 k. Restaging CT on 12/05/2012 with a mixed response in the liver l. Restaging CT 08/11/2013-mild increase in the size of liver lesions. m. Restaging CT 01/22/2014-stable to slightly improved liver lesions, mild increase in mesenteric adenopathy. Chromogranin A level stable. n. Chromogranin A level stable 05/01/2014. o. Chromogranin A higher on 08/02/2014 2. History of diarrhea (3 to 4 times per day), predating surgery for 8 months. Decrease in daily bowel movements coinciding with discontinuation of Nexium. 3. Hypertension. 4. History of gastroesophageal reflux disease. No reflux symptoms since discontinuing Nexium. 5. Rectal and ascending colon polyps. He underwent a colonoscopy on 12/20/2012 with findings of a sessile polyp in the ascending colon and a sessile polyp in the rectum. Pathology showed hyperplastic polyps. Prior right colon resection noted with a normal appearing ileocolonic anastomosis. Small internal hemorrhoids noted. Repeat colonoscopy in 5 years. 6. History of Iron deficiency anemia-improved.   Disposition:  Tommy Conley appears stable. He remains asymptomatic from the metastatic carcinoid tumor. The chromogranin A level was slightly higher. We will followup on the 24-hour urine 5 HIAA. He will be scheduled for an office visit and restaging CT evaluation in 3 months. We will consider initiating Sandostatin analog therapy or repeat radioembolization if the CT reveals evidence of significant disease progression.  Betsy Coder, MD  08/09/2014  11:22 AM

## 2014-08-10 ENCOUNTER — Other Ambulatory Visit: Payer: Self-pay | Admitting: Oncology

## 2014-08-10 ENCOUNTER — Other Ambulatory Visit: Payer: Self-pay | Admitting: Family Medicine

## 2014-08-10 DIAGNOSIS — C7A021 Malignant carcinoid tumor of the cecum: Secondary | ICD-10-CM

## 2014-08-11 LAB — 5 HIAA, QUANTITATIVE, URINE, 24 HOUR
5-HIAA, 24 Hr Urine: 191.7 mg/24 h — ABNORMAL HIGH (ref <=6.0)
Volume, Urine-5HIAA: 1500 mL/24 h

## 2014-08-16 ENCOUNTER — Telehealth: Payer: Self-pay | Admitting: *Deleted

## 2014-08-16 NOTE — Telephone Encounter (Signed)
Message copied by Brien Few on Thu Aug 16, 2014  4:52 PM ------      Message from: Betsy Coder B      Created: Wed Aug 15, 2014  2:33 PM       Please call patient, 24 hr uine 5-HIAA is better, f/u as scheduled ------

## 2014-08-16 NOTE — Telephone Encounter (Signed)
Left message on voicemail for pt to call office for lab results. (24 hr urine 5-HIAA is better, follow up as scheduled, per Dr. Benay Spice.)

## 2014-08-17 ENCOUNTER — Telehealth: Payer: Self-pay | Admitting: *Deleted

## 2014-08-17 NOTE — Telephone Encounter (Signed)
Message copied by Domenic Schwab on Fri Aug 17, 2014  2:42 PM ------      Message from: Betsy Coder B      Created: Wed Aug 15, 2014  2:33 PM       Please call patient, 24 hr uine 5-HIAA is better, f/u as scheduled ------

## 2014-08-17 NOTE — Telephone Encounter (Signed)
Per Dr. Benay Spice, notified pt that 24 hr urine is better.  Pt verbalized understanding and confirmed appt for 11/08/14.

## 2014-09-07 ENCOUNTER — Other Ambulatory Visit: Payer: Self-pay | Admitting: Family Medicine

## 2014-09-19 ENCOUNTER — Encounter: Payer: Self-pay | Admitting: Family Medicine

## 2014-09-19 ENCOUNTER — Ambulatory Visit (INDEPENDENT_AMBULATORY_CARE_PROVIDER_SITE_OTHER): Payer: 59 | Admitting: Family Medicine

## 2014-09-19 VITALS — BP 132/94 | Ht 73.0 in | Wt 207.0 lb

## 2014-09-19 DIAGNOSIS — Z0000 Encounter for general adult medical examination without abnormal findings: Secondary | ICD-10-CM

## 2014-09-19 DIAGNOSIS — H9319 Tinnitus, unspecified ear: Secondary | ICD-10-CM

## 2014-09-19 DIAGNOSIS — C7A021 Malignant carcinoid tumor of the cecum: Secondary | ICD-10-CM

## 2014-09-19 DIAGNOSIS — N528 Other male erectile dysfunction: Secondary | ICD-10-CM

## 2014-09-19 DIAGNOSIS — H9313 Tinnitus, bilateral: Secondary | ICD-10-CM

## 2014-09-19 DIAGNOSIS — I10 Essential (primary) hypertension: Secondary | ICD-10-CM

## 2014-09-19 DIAGNOSIS — K219 Gastro-esophageal reflux disease without esophagitis: Secondary | ICD-10-CM

## 2014-09-19 DIAGNOSIS — Z79899 Other long term (current) drug therapy: Secondary | ICD-10-CM

## 2014-09-19 DIAGNOSIS — Z125 Encounter for screening for malignant neoplasm of prostate: Secondary | ICD-10-CM

## 2014-09-19 DIAGNOSIS — Z23 Encounter for immunization: Secondary | ICD-10-CM

## 2014-09-19 DIAGNOSIS — N529 Male erectile dysfunction, unspecified: Secondary | ICD-10-CM

## 2014-09-19 MED ORDER — SILDENAFIL CITRATE 100 MG PO TABS: 100.0000 mg | ORAL_TABLET | Freq: Every day | ORAL | Status: DC | PRN

## 2014-09-19 MED ORDER — LOSARTAN POTASSIUM 50 MG PO TABS: ORAL_TABLET | ORAL | Status: DC

## 2014-09-19 MED ORDER — AMLODIPINE BESYLATE 10 MG PO TABS: ORAL_TABLET | ORAL | Status: DC

## 2014-09-19 MED ORDER — POTASSIUM CHLORIDE CRYS ER 20 MEQ PO TBCR: EXTENDED_RELEASE_TABLET | ORAL | Status: DC

## 2014-09-19 NOTE — Progress Notes (Signed)
   Subjective:    Patient ID: Tommy Conley, male    DOB: 13-Jul-1951, 63 y.o.   MRN: [id number]  Hypertension This is a chronic problem. The current episode started more than 1 year ago. There are no compliance problems.   Walks every day   Needs refill on viagra. Notes it definitely helps his erectile dysfunction.   Flu vaccine.   Patient has history of carcinoid tumor. Followed by specialist for this. Currently under control per specialist statement   Referral to ENT. Hears echo when he talks. Ongoing for a few years.  Has been referred in the past. . Would like to see an ear nose and throat Dr. again. Was told in the past there was not much they can do.  Review of Systems No chest pain no back pain no abdominal pain no change in bowel habits    Objective:   Physical Exam  Alert vitals stable HEENT tympanic membranes somewhat retracted. Pharynx normal neck supple. Lungs clear. Heart regular in rhythm.      Assessment & Plan:  Pressure 1 hypertension good control #2 rectal dysfunction discussed ongoing challenges #3 carcinoid tumor followed by specialist. #4 chronic ENT challenges would like a reassessment. Notes diminished hearing skills particularly in a crowd. Plan referral refill meds appropriate blood work further recommendations based on results diet exercise discussed. WSL

## 2014-10-03 ENCOUNTER — Telehealth: Payer: Self-pay | Admitting: Family Medicine

## 2014-10-03 ENCOUNTER — Other Ambulatory Visit: Payer: Self-pay | Admitting: *Deleted

## 2014-10-03 MED ORDER — MAGIC MOUTHWASH: ORAL | Status: DC

## 2014-10-03 NOTE — Telephone Encounter (Signed)
Med sent to pharm. Pt notified.  

## 2014-10-03 NOTE — Telephone Encounter (Signed)
Pt states at last office visit, Dr. Richardson Landry mentioned calling in [place] for pt but it was never sent to pharmacy (and it's not on his med list)  Please call in to [facility] and notify pt when done [phone]

## 2014-10-03 NOTE — Telephone Encounter (Signed)
Med sent to [facility]. Pt notified on voicemail.

## 2014-10-03 NOTE — Telephone Encounter (Signed)
Lets do one tblspn swish and garhgle qid 12 oz three ref

## 2014-10-03 NOTE — Telephone Encounter (Signed)
Alum & Mag Hydroxide-Simeth (MAGIC MOUTHWASH) SOLN  [facility] called to state that they no longer can compound this at their facility  An that the wash they can use is on manufacturer back order  Was advised to call this into [facility] in stead  Let pt know when done.

## 2014-10-09 LAB — LIPID PANEL
Cholesterol: 138 mg/dL (ref 0–200)
HDL: 52 mg/dL (ref >39)
LDL CALC: 72 mg/dL (ref 0–99)
Total CHOL/HDL Ratio: 2.7 Ratio
Triglycerides: 70 mg/dL (ref <150)
VLDL: 14 mg/dL (ref 0–40)

## 2014-10-09 LAB — PSA: PSA: 1.29 ng/mL (ref <=4.00)

## 2014-10-09 LAB — HEPATIC FUNCTION PANEL
ALBUMIN: 3.7 g/dL (ref 3.5–5.2)
ALT: 12 U/L (ref 0–53)
AST: 16 U/L (ref 0–37)
Alkaline Phosphatase: 77 U/L (ref 39–117)
BILIRUBIN TOTAL: 1.2 mg/dL (ref 0.2–1.2)
Bilirubin, Direct: 0.3 mg/dL (ref 0.0–0.3)
Indirect Bilirubin: 0.9 mg/dL (ref 0.2–1.2)
Total Protein: 6.4 g/dL (ref 6.0–8.3)

## 2014-10-14 ENCOUNTER — Encounter: Payer: Self-pay | Admitting: Family Medicine

## 2014-10-26 ENCOUNTER — Other Ambulatory Visit: Payer: Self-pay | Admitting: Family Medicine

## 2014-10-28 ENCOUNTER — Other Ambulatory Visit: Payer: Self-pay | Admitting: Family Medicine

## 2014-11-06 ENCOUNTER — Encounter (HOSPITAL_COMMUNITY): Payer: Self-pay

## 2014-11-06 ENCOUNTER — Ambulatory Visit (HOSPITAL_COMMUNITY)
Admission: RE | Admit: 2014-11-06 | Discharge: 2014-11-06 | Disposition: A | Discharge status: Home / Self Care | Payer: 59 | Source: Ambulatory Visit | Attending: Oncology | Admitting: Oncology

## 2014-11-06 ENCOUNTER — Other Ambulatory Visit (HOSPITAL_BASED_OUTPATIENT_CLINIC_OR_DEPARTMENT_OTHER): Payer: 59

## 2014-11-06 ENCOUNTER — Other Ambulatory Visit: Payer: 59

## 2014-11-06 DIAGNOSIS — N400 Enlarged prostate without lower urinary tract symptoms: Secondary | ICD-10-CM | POA: Insufficient documentation

## 2014-11-06 DIAGNOSIS — C7A00 Malignant carcinoid tumor of unspecified site: Secondary | ICD-10-CM | POA: Insufficient documentation

## 2014-11-06 DIAGNOSIS — K769 Liver disease, unspecified: Secondary | ICD-10-CM | POA: Diagnosis not present

## 2014-11-06 DIAGNOSIS — Z9049 Acquired absence of other specified parts of digestive tract: Secondary | ICD-10-CM | POA: Diagnosis not present

## 2014-11-06 DIAGNOSIS — C7A021 Malignant carcinoid tumor of the cecum: Secondary | ICD-10-CM

## 2014-11-06 DIAGNOSIS — C7A012 Malignant carcinoid tumor of the ileum: Secondary | ICD-10-CM

## 2014-11-06 LAB — COMPREHENSIVE METABOLIC PANEL (CC13)
ALT: 18 U/L (ref 0–55)
AST: 16 U/L (ref 5–34)
Albumin: 3.8 g/dL (ref 3.5–5.0)
Alkaline Phosphatase: 78 U/L (ref 40–150)
Anion Gap: 9 meq/L (ref 3–11)
BUN: 7.6 mg/dL (ref 7.0–26.0)
CO2: 27 meq/L (ref 22–29)
Calcium: 10 mg/dL (ref 8.4–10.4)
Chloride: 105 meq/L (ref 98–109)
Creatinine: 0.9 mg/dL (ref 0.7–1.3)
Glucose: 87 mg/dL (ref 70–140)
Potassium: 3.8 meq/L (ref 3.5–5.1)
Sodium: 141 meq/L (ref 136–145)
Total Bilirubin: 0.63 mg/dL (ref 0.20–1.20)
Total Protein: 7 g/dL (ref 6.4–8.3)

## 2014-11-06 MED ORDER — IOHEXOL 300 MG/ML  SOLN
100.0000 mL | Freq: Once | INTRAMUSCULAR | Status: AC | PRN
  Administered 2014-11-06: 100 mL via INTRAVENOUS

## 2014-11-08 ENCOUNTER — Ambulatory Visit (HOSPITAL_BASED_OUTPATIENT_CLINIC_OR_DEPARTMENT_OTHER): Payer: 59 | Admitting: Nurse Practitioner

## 2014-11-08 ENCOUNTER — Telehealth: Payer: Self-pay | Admitting: Nurse Practitioner

## 2014-11-08 VITALS — BP 139/95 | HR 93 | Temp 98.2°F | Resp 19 | Ht 73.0 in | Wt 207.5 lb

## 2014-11-08 DIAGNOSIS — C7A012 Malignant carcinoid tumor of the ileum: Secondary | ICD-10-CM

## 2014-11-08 NOTE — Patient Instructions (Signed)

## 2014-11-08 NOTE — Telephone Encounter (Signed)
Pt confirmed labs/ov per 11/19 POF, gave pt AVS.... KJ

## 2014-11-08 NOTE — Progress Notes (Addendum)
Tommy Conley OFFICE PROGRESS NOTE   Diagnosis:  Carcinoid tumor  INTERVAL HISTORY:   Tommy Conley returns as scheduled. He feels well. He denies pain. He has a good appetite and good energy level. Bowels moving regularly. No diarrhea. No flushing. No fever, cough or shortness of breath.  Objective:  Vital signs in last 24 hours:  Blood pressure 139/95, pulse 93, temperature 98.2 F (36.8 C), temperature source Oral, resp. rate 19, height 6\' 1"  (1.854 m), weight 207 lb 8 oz (94.121 kg).    HEENT: no thrush or ulcers. Lymphatics: no palpable cervical, supraclavicular or axillary lymph nodes. Resp: lungs clear bilaterally. Cardio: regular rate and rhythm. GI: abdomen soft and nontender. No hepatomegaly. No mass. Vascular: no leg edema. Lab Results:  Lab Results  Component Value Date   WBC 6.9 08/11/2013   HGB 13.8 08/11/2013   HCT 42.0 08/11/2013   MCV 82.9 08/11/2013   PLT 209 08/11/2013   NEUTROABS 4.5 08/11/2013    Imaging:  No results found.  Medications: I have reviewed the patient's current medications.  Assessment/Plan: 1. 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  1. Status post a right colon/ileum resection on 09/29/2011.  2. Wedge biopsy of a right liver lesion on 09/29/2011 confirmed metastatic well-differentiated neuroendocrine carcinoma.  3. CT of the abdomen 08/25/2011 consistent with multiple liver metastases.  4. Elevated preoperative chromogranin A level and a 24-hour urine 5-HIAA level. Stable on repeat 11/16/2011. 5. Restaging CT on 12/28/2011 confirmed slight progression of the metastatic liver lesions. 6. Status post 3 cycles of Xeloda/temozolomide with cycle 3 initiated on 03/12/2012 7. Restaging CT of the abdomen 04/04/2012 revealed slight progression of the metastatic liver lesions and transverse mesocolon adenopathy. 8. Status post Y-90  radioembolization of the right liver on 05/19/2012. 9. Status post Y-90 radioembolization of the left liver on 07/06/2012. 10. Chromogranin A level decreased on 08/29/2012, 24-hour urine 5 HIAA slightly increased on 09/05/2012 11. Restaging CT on 12/05/2012 with a mixed response in the liver 12. Restaging CT 08/11/2013-mild increase in the size of liver lesions. 13. Restaging CT 01/22/2014-stable to slightly improved liver lesions, mild increase in mesenteric adenopathy. Chromogranin A level stable. 14. Chromogranin A level stable 05/01/2014. 15. Chromogranin A higher on 08/02/2014 16. Restaging CT 11/06/2014 with interval enlargement of the enhancing lesions within the left and right hepatic lobe. 2. History of diarrhea (3 to 4 times per day), predating surgery for 8 months. Decrease in daily bowel movements coinciding with discontinuation of Nexium. 3. Hypertension. 4. History of gastroesophageal reflux disease. No reflux symptoms since discontinuing Nexium. 5. Rectal and ascending colon polyps. He underwent a colonoscopy on 12/20/2012 with findings of a sessile polyp in the ascending colon and a sessile polyp in the rectum. Pathology showed hyperplastic polyps. Prior right colon resection noted with a normal appearing ileocolonic anastomosis. Small internal hemorrhoids noted. Repeat colonoscopy in 5 years. 6. History of Iron deficiency anemia-improved.   Disposition: Tommy Conley appears stable. The restaging CT scan shows evidence of mild progression in the liver. He remains asymptomatic.  Dr. Benay Spice reviewed the CT reports/images with Tommy Conley and recommended initiation of Sandostatin on a monthly schedule. Potential toxicities were reviewed and written information provided. He would like to begin Sandostatin following the holidays. He will return for the first injection on 12/24/2013.  Dr. Benay Spice will present his case at the upcoming GI cancer conference as well to see if he is a  candidate  for embolization.  Tommy Conley will return for a followup visit on 02/29/2015. He will contact the office in the interim with any problems.  Patient seen with Dr. Benay Spice. 25 minutes were spent face-to-face at today's visit with the majority of the time involved in counseling/coordination of care.    Ned Card ANP/GNP-BC   11/08/2014  11:26 AM  This was a shared visit with Ned Card. Tommy Conley remains asymptomatic from the carcinoid tumor. The restaging CT reveals progressive disease in the liver. I will present his case at the GI tumor conference to discuss the indication for an embolization procedure to dominant disease in the right liver. I recommended initiating Sandostatin therapy. He agrees to proceed.  Julieanne Manson, M.D.

## 2014-11-12 LAB — CHROMOGRANIN A: Chromogranin A: 259 ng/mL — ABNORMAL HIGH (ref <=15)

## 2014-11-16 ENCOUNTER — Other Ambulatory Visit: Payer: Self-pay | Admitting: Nurse Practitioner

## 2014-11-16 DIAGNOSIS — C7A021 Malignant carcinoid tumor of the cecum: Secondary | ICD-10-CM

## 2014-11-20 ENCOUNTER — Other Ambulatory Visit: Payer: Self-pay | Admitting: Nurse Practitioner

## 2014-11-21 ENCOUNTER — Telehealth: Payer: Self-pay | Admitting: Nurse Practitioner

## 2014-11-21 NOTE — Telephone Encounter (Signed)
Injection added monthly per 12/01 POF..... KJ

## 2014-11-29 ENCOUNTER — Ambulatory Visit
Admission: RE | Admit: 2014-11-29 | Discharge: 2014-11-29 | Disposition: A | Discharge status: Home / Self Care | Payer: 59 | Source: Ambulatory Visit | Attending: Nurse Practitioner | Admitting: Nurse Practitioner

## 2014-11-29 DIAGNOSIS — C7A021 Malignant carcinoid tumor of the cecum: Secondary | ICD-10-CM

## 2014-11-29 NOTE — Progress Notes (Signed)
Patient ID: Tommy Conley, male   DOB: [birth year], 63 y.o.   MRN: [id number]   Chief Complaint: Metastatic well-differentiated neuroendocrine tumor to the liver from a colon primary. Follow-up outpatient appointment for additional hepatic embolization therapy.  Referring Physician(s): sherrill  History of Present Illness: Tommy Conley is a 63 y.o. male with known metastatic carcinoid tumor to the liver from a cecal primary. He has undergone previous Y 90 radioembolization therapy performed 05/19/2012 and 07/06/2012. Since these treatments he has had several surveillance scans of his liver which demonstrated stable hepatic disease until recently. Most recent scan 11/06/2014 demonstrates some interval enlargement of left and right hepatic lesions. This has been associated with interval elevation of the chromogranin A. No associated biliary dilatation. Portal vein remains patent. He continues to remain asymptomatic. No significant change in bowel habits. No diarrhea, flushing, diaphoresis, night sweats, or fevers. Since our last visit, he has retired. Excellent functional status. Overall clinically he is doing very well.  Past Medical History  Diagnosis Date  . Hypertension   . Barrett's esophagus   . GERD (gastroesophageal reflux disease)   . Colon polyp   . Carcinoid tumor of cecum   . Perennial allergic rhinitis   . IBS (irritable bowel syndrome)   . Cancer     colon ca with liver lesions    Past Surgical History  Procedure Laterality Date  . Colonoscopy  07/2011  . Upper gastrointestinal endoscopy      hx barrett's esophagus  . Colectomy  09/29/11    lap - right with liver bx  . Wedge liver biopsy  09/29/11    right liver lesion  . Colon surgery      Allergies: Vasotec  Medications: Prior to Admission medications   Medication Sig Start Date End Date Taking? Authorizing Provider  Alum & Mag Hydroxide-Simeth (MAGIC MOUTHWASH) SOLN ONE TABLESPOON SWISH AND GARGLE QID  10/03/14   Mikey Kirschner, MD  amLODipine (NORVASC) 10 MG tablet TAKE 1 TABLET BY MOUTH EVERY DAY 09/19/14   Mikey Kirschner, MD  hyoscyamine (LEVBID) 0.375 MG 12 hr tablet TAKE ONE TABLET BY MOUTH TWICE DAILY 10/29/14   Mikey Kirschner, MD  losartan (COZAAR) 50 MG tablet TAKE 1 TABLET (50 MG TOTAL) BY MOUTH EVERY EVENING. 09/19/14   Mikey Kirschner, MD  potassium chloride SA (KLOR-CON M20) 20 MEQ tablet TAKE 1 TABLET BY MOUTH EVERY DAY 09/19/14   Mikey Kirschner, MD  sildenafil (VIAGRA) 100 MG tablet Take 1 tablet (100 mg total) by mouth daily as needed for erectile dysfunction. 09/19/14   Mikey Kirschner, MD    Family History  Problem Relation Age of Onset  . Colon polyps Neg Hx   . Colon cancer Neg Hx   . Stomach cancer Neg Hx   . Cancer Neg Hx   . Hypertension Father   . Hyperlipidemia Father   . Heart attack Father     History   Social History  . Marital Status: Legally Separated    Spouse Name: N/A    Number of Children: N/A  . Years of Education: N/A   Social History Main Topics  . Smoking status: Light Tobacco Smoker -- 0.25 packs/day for 17 years    Types: Cigarettes  . Smokeless tobacco: Never Used     Comment: does not smoke every day  . Alcohol Use: No  . Drug Use: No  . Sexual Activity: None   Other Topics Concern  . None  Social History Narrative    ECOG Status: 0 - Asymptomatic  Review of Systems: A 12 point ROS discussed and pertinent positives are indicated in the HPI above.  All other systems are negative.  Review of Systems  Constitutional: Negative for fever, diaphoresis and fatigue.  Respiratory: Negative for cough, chest tightness and wheezing.   Cardiovascular: Negative for chest pain and palpitations.  Gastrointestinal: Negative for abdominal distention.  Genitourinary: Negative for dysuria, urgency, hematuria and flank pain.  Psychiatric/Behavioral: Negative for behavioral problems and agitation.    Vital Signs: BP 135/94 mmHg   Pulse 98  Temp(Src) 98.1 F (36.7 C)  Resp 16  SpO2 99%  Physical Exam  Constitutional: He appears well-developed and well-nourished. No distress.  Cardiovascular: Normal rate and regular rhythm.   No murmur heard. Pulmonary/Chest: Effort normal and breath sounds normal. No respiratory distress. He has no wheezes.  Abdominal: Soft. Bowel sounds are normal. He exhibits no distension.  No significant hepatomegaly on exam.  Skin: He is not diaphoretic.  Psychiatric: He has a normal mood and affect. His behavior is normal. Thought content normal.    Imaging: Ct Abdomen Pelvis W Contrast  11/06/2014   CLINICAL DATA:  Restaging carcinoid tumor. Patient status Yttrium 90 radial embolization in2013. Prior colon resection in 2012. Patient has received radial was a shin of the left and right hepatic lobe.  EXAM: CT ABDOMEN AND PELVIS WITH CONTRAST  TECHNIQUE: Multidetector CT imaging of the abdomen and pelvis was performed using the standard protocol following bolus administration of intravenous contrast.  CONTRAST:  164mL OMNIPAQUE IOHEXOL 300 MG/ML  SOLN  COMPARISON:  CT 01/22/2014  FINDINGS: Lower chest:  Lung bases are clear.  Hepatobiliary: The large right hepatic lobe lesions are best demonstrated on the portal venous phase liver imaging. The dominant lesion in the right hepatic lobe measures 12.6 by 12.1 cm compared to 11.7 x 10.1 cm on comparison exam remeasured. Lesion extending from the inferior margin of the right hepatic lobe measures 5.8 x 4.0 cm compared to 5.9 by 3.8 cm on prior. Lesion in the left hepatic lobe posterior laterally measures 3.7 x 2.6 cm compared to 2.5 x 2.1 cm on prior.No new lesions identified. These lesions demonstrate arterial enhancement on the early phase imaging.  No biliary duct dilatation.  No ascites.  Pancreas: Insert normal pancreas Pancreas is normal. No ductal dilatation. No pancreatic inflammation.  Spleen: Normal spleen.  Adrenals/urinary tract: Adrenal glands  are normal. Kidneys are normal. Ureters and bladder normal.  Stomach/Bowel: Stomach, small bowel, colon are unchanged. Patient status post right hemicolectomy. No evidence local recurrence.  Vascular/Lymphatic: Abdominal aorta is normal caliber. There is no retroperitoneal or periportal lymphadenopathy. No pelvic lymphadenopathy. Coil pack in GDA is noted. No mesenteric mass.  Reproductive: Prostate gland is enlarged 256 mm in diameter. No pelvic lymphadenopathy. No aggressive osseous lesion.  Musculoskeletal:  No aggressive osseous lesion  Other: No free fluid the abdomen pelvis.  IMPRESSION:  1. Interval enlargement of the enhancing lesions within the left and right hepatic lobe. Dominant lesion in the right hepatic lobe measures up to 12 cm. 2. No evidence of lymphadenopathy or mesenteric nodal disease.   Electronically Signed   By: Suzy Bouchard M.D.   On: 11/06/2014 15:46    Labs:  CBC: No results for input(s): WBC, HGB, HCT, PLT in the last 8760 hours.  COAGS: No results for input(s): INR, APTT in the last 8760 hours.  BMP:  Recent Labs  01/22/14 0915 05/10/14 1004  08/02/14 1110 11/06/14 1011  NA 142 143 140 141  K 3.5 3.5 3.8 3.8  CO2 27 23 25 27   GLUCOSE 89 90 85 87  BUN 11.6 8.6 8.3 7.6  CALCIUM 9.5 9.2 9.6 10.0  CREATININE 0.9 0.9 0.9 0.9    LIVER FUNCTION TESTS:  Recent Labs  01/22/14 0915 10/08/14 1028 11/06/14 1011  BILITOT 0.35 1.2 0.63  AST 18 16 16   ALT 17 12 18   ALKPHOS 83 77 78  PROT 6.6 6.4 7.0  ALBUMIN 3.8 3.7 3.8    TUMOR MARKERS:  Recent Labs  01/22/14 0915 05/01/14 0955 08/02/14 1111 11/06/14 1012  CHROMGRNA 184.0* 214.0* 266* 259*    Assessment and Plan:  63 year old male with known well-differentiated metastatic carcinoid tumor to the liver, slight interval progression of hepatic lesions with an elevated chromogranin A since February 2015. Review of his imaging and laboratory data was performed today with the patient. He is a  candidate for repeat right hepatic Y 90 radioembolization. This was discussed at length with the patient including the procedure, risks, benefits and alternatives. He would like to proceed with a third Y 90 radioembolization. This will be scheduled tentatively for the week of 12/31/2014.  I spent a total of 30 minutes face to face in clinical consultation, greater than 50% of which was counseling/coordinating care for this patient with metastatic disease to the liver.  SignedGreggory Keen T. 11/29/2014, 4:14 PM

## 2014-12-03 ENCOUNTER — Other Ambulatory Visit (HOSPITAL_COMMUNITY): Payer: Self-pay | Admitting: Interventional Radiology

## 2014-12-03 DIAGNOSIS — C787 Secondary malignant neoplasm of liver and intrahepatic bile duct: Secondary | ICD-10-CM

## 2014-12-03 DIAGNOSIS — C7A021 Malignant carcinoid tumor of the cecum: Secondary | ICD-10-CM

## 2014-12-10 ENCOUNTER — Other Ambulatory Visit: Payer: Self-pay | Admitting: Interventional Radiology

## 2014-12-10 DIAGNOSIS — C7B8 Other secondary neuroendocrine tumors: Secondary | ICD-10-CM

## 2014-12-21 HISTORY — PX: OTHER SURGICAL HISTORY: SHX169

## 2014-12-24 ENCOUNTER — Other Ambulatory Visit: Payer: Self-pay | Admitting: Nurse Practitioner

## 2014-12-24 ENCOUNTER — Telehealth: Payer: Self-pay | Admitting: Oncology

## 2014-12-24 ENCOUNTER — Ambulatory Visit (HOSPITAL_BASED_OUTPATIENT_CLINIC_OR_DEPARTMENT_OTHER): Payer: 59

## 2014-12-24 DIAGNOSIS — C7A012 Malignant carcinoid tumor of the ileum: Secondary | ICD-10-CM

## 2014-12-24 DIAGNOSIS — C7A021 Malignant carcinoid tumor of the cecum: Secondary | ICD-10-CM

## 2014-12-24 MED ORDER — OCTREOTIDE ACETATE 20 MG IM KIT
20.0000 mg | PACK | Freq: Once | INTRAMUSCULAR | Status: AC
  Administered 2014-12-24: 20 mg via INTRAMUSCULAR
  Filled 2014-12-24: qty 1

## 2014-12-24 NOTE — Patient Instructions (Signed)

## 2014-12-24 NOTE — Telephone Encounter (Signed)
gv adn pritned appt sched and avs for pt for Feb thru May

## 2014-12-26 ENCOUNTER — Other Ambulatory Visit (HOSPITAL_COMMUNITY): Payer: Self-pay | Admitting: Radiology

## 2014-12-26 DIAGNOSIS — C7B8 Other secondary neuroendocrine tumors: Secondary | ICD-10-CM

## 2014-12-26 DIAGNOSIS — C7A8 Other malignant neuroendocrine tumors: Secondary | ICD-10-CM

## 2014-12-26 LAB — COMPREHENSIVE METABOLIC PANEL
ALT: 13 U/L (ref 0–53)
ANION GAP: 8 (ref 5–15)
AST: 20 U/L (ref 0–37)
Albumin: 4.2 g/dL (ref 3.5–5.2)
Alkaline Phosphatase: 70 U/L (ref 39–117)
BUN: 9 mg/dL (ref 6–23)
CHLORIDE: 103 meq/L (ref 96–112)
CO2: 27 mmol/L (ref 19–32)
Calcium: 9.4 mg/dL (ref 8.4–10.5)
Creatinine, Ser: 0.97 mg/dL (ref 0.50–1.35)
GFR calc non Af Amer: 86 mL/min — ABNORMAL LOW (ref >90)
Glucose, Bld: 149 mg/dL — ABNORMAL HIGH (ref 70–99)
POTASSIUM: 3.4 mmol/L — AB (ref 3.5–5.1)
SODIUM: 138 mmol/L (ref 135–145)
TOTAL PROTEIN: 7.1 g/dL (ref 6.0–8.3)
Total Bilirubin: 0.7 mg/dL (ref 0.3–1.2)

## 2014-12-26 LAB — CBC WITH DIFFERENTIAL/PLATELET
Basophils Absolute: 0 10*3/uL (ref 0.0–0.1)
Basophils Relative: 0 % (ref 0–1)
Eosinophils Absolute: 0.1 10*3/uL (ref 0.0–0.7)
Eosinophils Relative: 2 % (ref 0–5)
HEMATOCRIT: 42.1 % (ref 39.0–52.0)
HEMOGLOBIN: 13.6 g/dL (ref 13.0–17.0)
Lymphocytes Relative: 23 % (ref 12–46)
Lymphs Abs: 1.4 10*3/uL (ref 0.7–4.0)
MCH: 26.8 pg (ref 26.0–34.0)
MCHC: 32.3 g/dL (ref 30.0–36.0)
MCV: 83 fL (ref 78.0–100.0)
MONOS PCT: 12 % (ref 3–12)
Monocytes Absolute: 0.7 10*3/uL (ref 0.1–1.0)
Neutro Abs: 3.9 10*3/uL (ref 1.7–7.7)
Neutrophils Relative %: 63 % (ref 43–77)
Platelets: 226 10*3/uL (ref 150–400)
RBC: 5.07 MIL/uL (ref 4.22–5.81)
RDW: 15.7 % — AB (ref 11.5–15.5)
WBC: 6.1 10*3/uL (ref 4.0–10.5)

## 2014-12-28 ENCOUNTER — Encounter: Payer: Self-pay | Admitting: *Deleted

## 2014-12-28 ENCOUNTER — Telehealth: Payer: Self-pay | Admitting: *Deleted

## 2014-12-28 NOTE — Progress Notes (Signed)
[place] Work  Clinical Social Work was referred by nurse for assessment of psychosocial needs due to issues with transportation.  Clinical Social Worker contacted patient at home to offer support and assess for needs.  Pt reports he has no friends or family that can pick him up right after his appointment. CSW problem solved with him on this issue. He reports to have family that can pick him up at 5pm after his procedure, but not unti that time. CSW discussed transportation options such as [employer], but he still would not have family/friend to pick him up. CSW contacted [facility] and let them know as well. They report he may be there until that time due to procedure and should be fine. Pt denied other concerns currently as he has own transportation and car to appts otherwise. Pt aware of how to contact CSW as needed.    Clinical Social Work interventions: Building surveyor education  Loren Racer, [place] Social Worker [place]  [place] Phone: [phone] Fax: [phone]

## 2014-12-28 NOTE — Telephone Encounter (Signed)
Message from [place] in radiology: pt is requesting social work assistance for a ride/ companion to take him home after his Y90 procedure 1/14. Spoke with Abby Potash, LCSW she will contact pt to discuss resources.

## 2015-01-01 ENCOUNTER — Other Ambulatory Visit: Payer: Self-pay | Admitting: Radiology

## 2015-01-02 ENCOUNTER — Other Ambulatory Visit (HOSPITAL_COMMUNITY): Payer: Self-pay | Admitting: Interventional Radiology

## 2015-01-02 DIAGNOSIS — C7A021 Malignant carcinoid tumor of the cecum: Secondary | ICD-10-CM

## 2015-01-03 ENCOUNTER — Ambulatory Visit (HOSPITAL_COMMUNITY)
Admission: RE | Admit: 2015-01-03 | Discharge: 2015-01-03 | Disposition: A | Discharge status: Home / Self Care | Payer: 59 | Source: Ambulatory Visit | Attending: Interventional Radiology | Admitting: Interventional Radiology

## 2015-01-03 ENCOUNTER — Encounter (HOSPITAL_COMMUNITY): Payer: Self-pay

## 2015-01-03 ENCOUNTER — Other Ambulatory Visit: Payer: Self-pay | Admitting: Interventional Radiology

## 2015-01-03 ENCOUNTER — Encounter (HOSPITAL_COMMUNITY)
Admission: RE | Admit: 2015-01-03 | Discharge: 2015-01-03 | Disposition: A | Discharge status: Home / Self Care | Payer: 59 | Source: Ambulatory Visit | Attending: Interventional Radiology | Admitting: Interventional Radiology

## 2015-01-03 DIAGNOSIS — C7B8 Other secondary neuroendocrine tumors: Secondary | ICD-10-CM

## 2015-01-03 DIAGNOSIS — C7A021 Malignant carcinoid tumor of the cecum: Secondary | ICD-10-CM

## 2015-01-03 DIAGNOSIS — F17210 Nicotine dependence, cigarettes, uncomplicated: Secondary | ICD-10-CM | POA: Diagnosis not present

## 2015-01-03 DIAGNOSIS — Z9049 Acquired absence of other specified parts of digestive tract: Secondary | ICD-10-CM | POA: Insufficient documentation

## 2015-01-03 DIAGNOSIS — C787 Secondary malignant neoplasm of liver and intrahepatic bile duct: Secondary | ICD-10-CM

## 2015-01-03 DIAGNOSIS — C7B02 Secondary carcinoid tumors of liver: Secondary | ICD-10-CM | POA: Insufficient documentation

## 2015-01-03 DIAGNOSIS — K589 Irritable bowel syndrome without diarrhea: Secondary | ICD-10-CM | POA: Insufficient documentation

## 2015-01-03 DIAGNOSIS — I10 Essential (primary) hypertension: Secondary | ICD-10-CM | POA: Diagnosis not present

## 2015-01-03 LAB — PROTIME-INR
INR: 1.12 (ref 0.00–1.49)
Prothrombin Time: 14.6 seconds (ref 11.6–15.2)

## 2015-01-03 LAB — CBC WITH DIFFERENTIAL/PLATELET
BASOS ABS: 0 10*3/uL (ref 0.0–0.1)
Basophils Relative: 1 % (ref 0–1)
EOS PCT: 2 % (ref 0–5)
Eosinophils Absolute: 0.1 10*3/uL (ref 0.0–0.7)
HCT: 40.5 % (ref 39.0–52.0)
Hemoglobin: 13.3 g/dL (ref 13.0–17.0)
Lymphocytes Relative: 28 % (ref 12–46)
Lymphs Abs: 1.7 10*3/uL (ref 0.7–4.0)
MCH: 27.3 pg (ref 26.0–34.0)
MCHC: 32.8 g/dL (ref 30.0–36.0)
MCV: 83 fL (ref 78.0–100.0)
Monocytes Absolute: 0.8 10*3/uL (ref 0.1–1.0)
Monocytes Relative: 13 % — ABNORMAL HIGH (ref 3–12)
NEUTROS ABS: 3.4 10*3/uL (ref 1.7–7.7)
Neutrophils Relative %: 56 % (ref 43–77)
PLATELETS: 198 10*3/uL (ref 150–400)
RBC: 4.88 MIL/uL (ref 4.22–5.81)
RDW: 15.5 % (ref 11.5–15.5)
WBC: 6 10*3/uL (ref 4.0–10.5)

## 2015-01-03 LAB — COMPREHENSIVE METABOLIC PANEL
ALT: 13 U/L (ref 0–53)
ANION GAP: 9 (ref 5–15)
AST: 15 U/L (ref 0–37)
Albumin: 3.7 g/dL (ref 3.5–5.2)
Alkaline Phosphatase: 67 U/L (ref 39–117)
BILIRUBIN TOTAL: 0.7 mg/dL (ref 0.3–1.2)
BUN: 12 mg/dL (ref 6–23)
CALCIUM: 8.9 mg/dL (ref 8.4–10.5)
CO2: 25 mmol/L (ref 19–32)
CREATININE: 0.89 mg/dL (ref 0.50–1.35)
Chloride: 106 mEq/L (ref 96–112)
GFR calc Af Amer: >90 mL/min (ref >90)
GFR, EST NON AFRICAN AMERICAN: 89 mL/min — AB (ref >90)
Glucose, Bld: 106 mg/dL — ABNORMAL HIGH (ref 70–99)
Potassium: 3.7 mmol/L (ref 3.5–5.1)
SODIUM: 140 mmol/L (ref 135–145)
Total Protein: 6.5 g/dL (ref 6.0–8.3)

## 2015-01-03 LAB — APTT: aPTT: 28 seconds (ref 24–37)

## 2015-01-03 MED ORDER — FENTANYL CITRATE 0.05 MG/ML IJ SOLN
INTRAMUSCULAR | Status: AC | PRN
  Administered 2015-01-03 (×2): 25 ug via INTRAVENOUS
  Administered 2015-01-03: 50 ug via INTRAVENOUS

## 2015-01-03 MED ORDER — ONDANSETRON HCL 4 MG/2ML IJ SOLN
4.0000 mg | Freq: Once | INTRAMUSCULAR | Status: AC
  Administered 2015-01-03: 4 mg via INTRAVENOUS
  Filled 2015-01-03: qty 2

## 2015-01-03 MED ORDER — IOHEXOL 300 MG/ML  SOLN
80.0000 mL | Freq: Once | INTRAMUSCULAR | Status: AC | PRN
  Administered 2015-01-03: 90 mL via INTRA_ARTERIAL

## 2015-01-03 MED ORDER — PANTOPRAZOLE SODIUM 40 MG IV SOLR
40.0000 mg | Freq: Once | INTRAVENOUS | Status: AC
  Administered 2015-01-03: 40 mg via INTRAVENOUS
  Filled 2015-01-03: qty 40

## 2015-01-03 MED ORDER — SODIUM CHLORIDE 0.9 % IV SOLN
INTRAVENOUS | Status: DC
  Administered 2015-01-03: 08:00:00 via INTRAVENOUS

## 2015-01-03 MED ORDER — SODIUM CHLORIDE 0.9 % IV SOLN: INTRAVENOUS | Status: DC

## 2015-01-03 MED ORDER — DEXAMETHASONE SODIUM PHOSPHATE 10 MG/ML IJ SOLN
10.0000 mg | Freq: Once | INTRAMUSCULAR | Status: AC
  Administered 2015-01-03: 10 mg via INTRAVENOUS
  Filled 2015-01-03: qty 1

## 2015-01-03 MED ORDER — FENTANYL CITRATE 0.05 MG/ML IJ SOLN
INTRAMUSCULAR | Status: AC
  Filled 2015-01-03: qty 4

## 2015-01-03 MED ORDER — OCTREOTIDE ACETATE 100 MCG/ML IJ SOLN
200.0000 ug | Freq: Once | INTRAMUSCULAR | Status: AC
  Administered 2015-01-03: 200 ug via INTRAVENOUS
  Filled 2015-01-03: qty 2

## 2015-01-03 MED ORDER — MIDAZOLAM HCL 2 MG/2ML IJ SOLN
INTRAMUSCULAR | Status: AC
  Filled 2015-01-03: qty 6

## 2015-01-03 MED ORDER — PIPERACILLIN-TAZOBACTAM 3.375 G IVPB
3.3750 g | Freq: Once | INTRAVENOUS | Status: AC
  Administered 2015-01-03: 3.375 g via INTRAVENOUS
  Filled 2015-01-03: qty 50

## 2015-01-03 MED ORDER — MIDAZOLAM HCL 2 MG/2ML IJ SOLN
INTRAMUSCULAR | Status: AC | PRN
  Administered 2015-01-03: 1 mg via INTRAVENOUS
  Administered 2015-01-03 (×2): 0.5 mg via INTRAVENOUS

## 2015-01-03 MED ORDER — SODIUM CHLORIDE 0.9 % IV SOLN: 200.0000 ug/h | Freq: Once | INTRAVENOUS | Status: DC

## 2015-01-03 MED ORDER — YTTRIUM 90 INJECTION: 35.9000 | INJECTION | Freq: Once | INTRAVENOUS | Status: DC

## 2015-01-03 MED ORDER — LIDOCAINE HCL 1 % IJ SOLN
INTRAMUSCULAR | Status: AC
  Filled 2015-01-03: qty 20

## 2015-01-03 NOTE — Sedation Documentation (Signed)
10fr sheath removed from right fem artery by Dr. Annamaria Boots. Hemostasis achieved using exoseal closure device. Right groin level 0. 3+RDP

## 2015-01-03 NOTE — Sedation Documentation (Signed)
Pt transported to short stay for recovery.

## 2015-01-03 NOTE — Procedures (Signed)
Successful RT HEPATIC y90 RADIOEMBOLIZATION No comp Stable Full report in Pacs

## 2015-01-03 NOTE — H&P (Signed)
Chief Complaint: "I am here for my liver treatment."  Referring Physician(s): Shick,Michael  History of Present Illness: Tommy Conley is a 64 y.o. male with metastatic neuroendocrine tumor to the liver from colon primary. He has undergone previous Y-90 radioembolization on 04/2012 and 06/2012 and tolerated the treatment well. He has been seen in follow up on 11/29/14 and imaging revealed interval enlargement of hepatic lesions. He is scheduled today for right hepatic y-90 radioembolization. He is currently receiving sandostatin injections monthly and his last injection was 12/24/14. He denies any chest pain, shortness of breath or palpitations. He denies any active signs of bleeding or excessive bruising. He denies any recent fever or chills. He denies any recent nausea or vomiting. The patient denies any history of sleep apnea or chronic oxygen use. He has previously tolerated sedation without complications.    Past Medical History  Diagnosis Date  . Hypertension   . Barrett's esophagus   . GERD (gastroesophageal reflux disease)   . Colon polyp   . Carcinoid tumor of cecum   . Perennial allergic rhinitis   . IBS (irritable bowel syndrome)   . Cancer     colon ca with liver lesions    Past Surgical History  Procedure Laterality Date  . Colonoscopy  07/2011  . Upper gastrointestinal endoscopy      hx barrett's esophagus  . Colectomy  09/29/11    lap - right with liver bx  . Wedge liver biopsy  09/29/11    right liver lesion  . Colon surgery      Allergies: Vasotec  Medications: Prior to Admission medications   Medication Sig Start Date End Date Taking? Authorizing Provider  Alum & Mag Hydroxide-Simeth (MAGIC MOUTHWASH) SOLN ONE TABLESPOON SWISH AND GARGLE QID Patient taking differently: Take 15 mLs by mouth 4 (four) times daily as needed for mouth pain.  10/03/14  Yes Mikey Kirschner, MD  amLODipine (NORVASC) 10 MG tablet Take 10 mg by mouth every morning.   Yes  Historical Provider, MD  hyoscyamine (LEVBID) 0.375 MG 12 hr tablet Take 0.375 mg by mouth 2 (two) times daily.   Yes Historical Provider, MD  losartan (COZAAR) 50 MG tablet Take 50 mg by mouth at bedtime.   Yes Historical Provider, MD  potassium chloride SA (K-DUR,KLOR-CON) 20 MEQ tablet Take 20 mEq by mouth every evening.   Yes Historical Provider, MD  sildenafil (VIAGRA) 100 MG tablet Take 1 tablet (100 mg total) by mouth daily as needed for erectile dysfunction. 09/19/14  Yes Mikey Kirschner, MD  amLODipine (NORVASC) 10 MG tablet TAKE 1 TABLET BY MOUTH EVERY DAY Patient not taking: Reported on 01/02/2015 09/19/14   Mikey Kirschner, MD  hyoscyamine (LEVBID) 0.375 MG 12 hr tablet TAKE ONE TABLET BY MOUTH TWICE DAILY Patient not taking: Reported on 01/02/2015 10/29/14   Mikey Kirschner, MD  losartan (COZAAR) 50 MG tablet TAKE 1 TABLET (50 MG TOTAL) BY MOUTH EVERY EVENING. Patient not taking: Reported on 01/02/2015 09/19/14   Mikey Kirschner, MD  potassium chloride SA (KLOR-CON M20) 20 MEQ tablet TAKE 1 TABLET BY MOUTH EVERY DAY Patient not taking: Reported on 01/02/2015 09/19/14   Mikey Kirschner, MD    Family History  Problem Relation Age of Onset  . Colon polyps Neg Hx   . Colon cancer Neg Hx   . Stomach cancer Neg Hx   . Cancer Neg Hx   . Hypertension Father   . Hyperlipidemia Father   .  Heart attack Father     History   Social History  . Marital Status: Legally Separated    Spouse Name: N/A    Number of Children: N/A  . Years of Education: N/A   Social History Main Topics  . Smoking status: Light Tobacco Smoker -- 0.25 packs/day for 17 years    Types: Cigarettes  . Smokeless tobacco: Never Used     Comment: does not smoke every day  . Alcohol Use: No  . Drug Use: No  . Sexual Activity: None   Other Topics Concern  . None   Social History Narrative    Review of Systems: A 12 point ROS discussed and pertinent positives are indicated in the HPI above.  All other systems  are negative.  Review of Systems  Vital Signs: BP 147/99 mmHg  Pulse 75  Temp(Src) 98.2 F (36.8 C) (Oral)  Resp 16  Ht 6\' 1"  (1.854 m)  Wt 205 lb 4 oz (93.101 kg)  BMI 27.09 kg/m2  SpO2 100%  Physical Exam  Constitutional: He is oriented to person, place, and time. No distress.  HENT:  Head: Normocephalic and atraumatic.  Neck: No tracheal deviation present.  Cardiovascular: Normal rate and regular rhythm.  Exam reveals no gallop and no friction rub.   No murmur heard. Pulmonary/Chest: Effort normal and breath sounds normal. No respiratory distress. He has no wheezes. He has no rales.  Abdominal: Soft. Bowel sounds are normal. He exhibits no distension. There is no tenderness.  Neurological: He is alert and oriented to person, place, and time.  Skin: Skin is warm and dry. He is not diaphoretic.  Psychiatric: He has a normal mood and affect. His behavior is normal. Thought content normal.    Imaging: No results found.  Labs:  CBC:  Recent Labs  12/26/14 1700 01/03/15 0750  WBC 6.1 6.0  HGB 13.6 13.3  HCT 42.1 40.5  PLT 226 198    COAGS:  Recent Labs  01/03/15 0750  INR 1.12  APTT 28    BMP:  Recent Labs  05/10/14 1004 08/02/14 1110 11/06/14 1011 12/26/14 1700  NA 143 140 141 138  K 3.5 3.8 3.8 3.4*  CL  --   --   --  103  CO2 23 25 27 27   GLUCOSE 90 85 87 149*  BUN 8.6 8.3 7.6 9  CALCIUM 9.2 9.6 10.0 9.4  CREATININE 0.9 0.9 0.9 0.97  GFRNONAA  --   --   --  86*  GFRAA  --   --   --  >90    LIVER FUNCTION TESTS:  Recent Labs  01/22/14 0915 10/08/14 1028 11/06/14 1011 12/26/14 1700  BILITOT 0.35 1.2 0.63 0.7  AST 18 16 16 20   ALT 17 12 18 13   ALKPHOS 83 77 78 70  PROT 6.6 6.4 7.0 7.1  ALBUMIN 3.8 3.7 3.8 4.2    TUMOR MARKERS:  Recent Labs  01/22/14 0915 05/01/14 0955 08/02/14 1111 11/06/14 1012  CHROMGRNA 184.0* 214.0* 266* 259*    Assessment and Plan: Metastatic neuroendocrine tumor to the liver from colon primary    Seen in the clinic 11/29/14 cheduled today for right hepatic Y-90 Radioembolization Patient has previously underwent radioembolization and tolerated it well He has been NPO since midnight, no blood thinners taken, labs reviewed, afebrile Risks and Benefits discussed with the patient. All of the patient's questions were answered, patient is agreeable to proceed. Consent signed and in chart.     Signed:  Tsosie Billing D 01/03/2015, 8:42 AM

## 2015-01-03 NOTE — Sedation Documentation (Signed)
Pt transported to [facility] on bed with RN for post imaging.

## 2015-01-05 ENCOUNTER — Telehealth: Payer: Self-pay | Admitting: Oncology

## 2015-01-05 LAB — CHROMOGRANIN A: CHROMOGRANIN A: 145 ng/mL — AB (ref <=15)

## 2015-01-05 NOTE — Telephone Encounter (Signed)
s.w and advised on appt d.t change due to md on call...pt ok and aware

## 2015-01-05 NOTE — Telephone Encounter (Signed)
pt expressed that the new meds he on he had dizziness and red eyes...i offered him to speak with on call nurse he refused and stated that he made an appt with his pcp.

## 2015-01-08 ENCOUNTER — Encounter: Payer: Self-pay | Admitting: Family Medicine

## 2015-01-08 ENCOUNTER — Ambulatory Visit (INDEPENDENT_AMBULATORY_CARE_PROVIDER_SITE_OTHER): Payer: 59 | Admitting: Family Medicine

## 2015-01-08 VITALS — BP 130/90 | Ht 73.0 in | Wt 209.0 lb

## 2015-01-08 DIAGNOSIS — I10 Essential (primary) hypertension: Secondary | ICD-10-CM

## 2015-01-08 MED ORDER — LOSARTAN POTASSIUM 100 MG PO TABS: 100.0000 mg | ORAL_TABLET | Freq: Every day | ORAL | Status: DC

## 2015-01-08 NOTE — Progress Notes (Signed)
   Subjective:    Patient ID: Tommy Conley, male    DOB: October 03, 1951, 64 y.o.   MRN: [id number]  HPI Patient is here today because he has been experiencing dizziness, redness in his eyes and high blood pressure ever since starting the new medications he is receiving at the cancer center. Patient had impressive flushing spell 5 days after treatment.  Oncology notes reviewed. Patient now on a new Sandostatin regimen.  Also the specialists are considering embolization therapy for the patient's carcinoid tumor.  No history of hormonal flushing spells with the carcinoid tumor.  Review of prior note shows blood pressures have been substantially elevated in the past year.  This has been present since 12/24/14 when he received his infusion at the cancer center.    Patient states that he has no other concerns at this time.    Review of Systems No headache no chest pain some nonspecific right upper quadrant discomfort no vomiting no diarrhea ROS otherwise negative    Objective:   Physical Exam  Alert blood pressure 152/90 on repeat H&T normal. Lungs clear. Heart regular in rhythm. Abdomen benign.      Assessment & Plan:  Impression 1 hypertension suboptimal control discussed #2 carcinoid tumor discussed #3 possible side effects from medicine am really not sure. Encouraged to back with oncologist plan easily 25 minutes spent most in discussion. Initiate losartan 100 mg. Symptomatic care discussed. Recheck in several months. WSL

## 2015-01-21 ENCOUNTER — Other Ambulatory Visit: Payer: Self-pay | Admitting: Family Medicine

## 2015-01-21 ENCOUNTER — Ambulatory Visit (HOSPITAL_BASED_OUTPATIENT_CLINIC_OR_DEPARTMENT_OTHER): Payer: 59

## 2015-01-21 DIAGNOSIS — E340 Carcinoid syndrome: Secondary | ICD-10-CM

## 2015-01-21 DIAGNOSIS — C7A021 Malignant carcinoid tumor of the cecum: Secondary | ICD-10-CM

## 2015-01-21 DIAGNOSIS — C7A012 Malignant carcinoid tumor of the ileum: Secondary | ICD-10-CM

## 2015-01-21 MED ORDER — OCTREOTIDE ACETATE 20 MG IM KIT
20.0000 mg | PACK | Freq: Once | INTRAMUSCULAR | Status: AC
  Administered 2015-01-21: 20 mg via INTRAMUSCULAR
  Filled 2015-01-21: qty 1

## 2015-01-22 ENCOUNTER — Telehealth: Payer: Self-pay | Admitting: Family Medicine

## 2015-01-22 NOTE — Telephone Encounter (Signed)
pts BP med was changed around 2 weeks ago, he went to have a  Procedure done on 2/1 and they advised that he contact you regarding  His BP level was at 135/100   He's feeling a little anxious about this number   Please advise, pt wanting to know if any adjustments need to be done Or what would you advise at that this point

## 2015-01-22 NOTE — Telephone Encounter (Signed)
Pt denies any SOB, chest pain, blurry vision, or HA. He said it was more of a FYI. You increased his Losartan to 100 mg and is wondering if it needs to be increased more or another BP med added.

## 2015-01-24 NOTE — Telephone Encounter (Signed)
Did we ever do anything with this? Has the patient been contacted again?

## 2015-02-15 ENCOUNTER — Other Ambulatory Visit: Payer: Self-pay | Admitting: *Deleted

## 2015-02-15 DIAGNOSIS — C7A021 Malignant carcinoid tumor of the cecum: Secondary | ICD-10-CM

## 2015-02-18 ENCOUNTER — Telehealth: Payer: Self-pay | Admitting: Oncology

## 2015-02-18 ENCOUNTER — Ambulatory Visit: Payer: 59 | Admitting: Nurse Practitioner

## 2015-02-18 ENCOUNTER — Other Ambulatory Visit: Payer: 59

## 2015-02-18 ENCOUNTER — Ambulatory Visit (HOSPITAL_BASED_OUTPATIENT_CLINIC_OR_DEPARTMENT_OTHER): Payer: 59

## 2015-02-18 ENCOUNTER — Ambulatory Visit (HOSPITAL_BASED_OUTPATIENT_CLINIC_OR_DEPARTMENT_OTHER): Payer: 59 | Admitting: Oncology

## 2015-02-18 VITALS — BP 136/92 | HR 90 | Temp 98.5°F | Resp 18 | Ht 73.0 in | Wt 209.6 lb

## 2015-02-18 DIAGNOSIS — C7A021 Malignant carcinoid tumor of the cecum: Secondary | ICD-10-CM

## 2015-02-18 DIAGNOSIS — D509 Iron deficiency anemia, unspecified: Secondary | ICD-10-CM

## 2015-02-18 DIAGNOSIS — C7A012 Malignant carcinoid tumor of the ileum: Secondary | ICD-10-CM

## 2015-02-18 DIAGNOSIS — E340 Carcinoid syndrome: Secondary | ICD-10-CM

## 2015-02-18 DIAGNOSIS — C7B02 Secondary carcinoid tumors of liver: Secondary | ICD-10-CM

## 2015-02-18 MED ORDER — OCTREOTIDE ACETATE 20 MG IM KIT
20.0000 mg | PACK | Freq: Once | INTRAMUSCULAR | Status: AC
  Administered 2015-02-18: 20 mg via INTRAMUSCULAR
  Filled 2015-02-18: qty 1

## 2015-02-18 NOTE — Progress Notes (Signed)
  [place] OFFICE PROGRESS NOTE   Diagnosis: Carcinoid tumor  INTERVAL HISTORY:   [name] returns as scheduled. He continues monthly Sandostatin. He notes a decrease in stool frequency for a few weeks after each Sandostatin injection. He underwent radioembolization to the right liver on 01/03/2015. He reports discomfort in the right upper lateral abdomen following this procedure. No complaint today.  Objective:  Vital signs in last 24 hours:  Blood pressure 136/92, pulse 90, temperature 98.5 F (36.9 C), resp. rate 18, height 6\' 1"  (1.854 m), weight 209 lb 9.6 oz (95.074 kg), SpO2 100 %.    HEENT: No thrush Resp: Lungs clear bilaterally Cardio: Regular rhythm and rhythm GI: No hepatosplenomegaly, nontender, no mass Vascular: No leg edema   Lab Results:  Chromogranin A on 01/03/2015: 145   Medications: I have reviewed the patient's current medications.  Assessment/Plan: 1. 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  1. Status post a right colon/ileum resection on 09/29/2011.  2. Wedge biopsy of a right liver lesion on 09/29/2011 confirmed metastatic well-differentiated neuroendocrine carcinoma.  3. CT of the abdomen 08/25/2011 consistent with multiple liver metastases.  4. Elevated preoperative chromogranin A level and a 24-hour urine 5-HIAA level. Stable on repeat 11/16/2011. 5. Restaging CT on 12/28/2011 confirmed slight progression of the metastatic liver lesions. 6. Status post 3 cycles of Xeloda/temozolomide with cycle 3 initiated on 03/12/2012 7. Restaging CT of the abdomen 04/04/2012 revealed slight progression of the metastatic liver lesions and transverse mesocolon adenopathy. 8. Status post Y-90 radioembolization of the right liver on 05/19/2012. 9. Status post Y-90 radioembolization of the left liver on 07/06/2012. 10. Chromogranin A level decreased on  08/29/2012, 24-hour urine 5 HIAA slightly increased on 09/05/2012 11. Restaging CT on 12/05/2012 with a mixed response in the liver 12. Restaging CT 08/11/2013-mild increase in the size of liver lesions. 13. Restaging CT 01/22/2014-stable to slightly improved liver lesions, mild increase in mesenteric adenopathy. Chromogranin A level stable. 14. Chromogranin A level stable 05/01/2014. 15. Chromogranin A higher on 08/02/2014 16. Restaging CT 11/06/2014 with interval enlargement of the enhancing lesions within the left and right hepatic lobe. 17. Initiation of monthly Sandostatin 12/24/2014 18. Status post Y-90 radioembolization of the right liver on 01/03/2015 2. History of diarrhea (3 to 4 times per day), predating surgery for 8 months. Decrease in daily bowel movements coinciding with discontinuation of Nexium. 3. Hypertension. 4. History of gastroesophageal reflux disease. No reflux symptoms since discontinuing Nexium. 5. Rectal and ascending colon polyps. He underwent a colonoscopy on 12/20/2012 with findings of a sessile polyp in the ascending colon and a sessile polyp in the rectum. Pathology showed hyperplastic polyps. Prior right colon resection noted with a normal appearing ileocolonic anastomosis. Small internal hemorrhoids noted. Repeat colonoscopy in 5 years. 6. History of Iron deficiency anemia-improved.   Disposition: Mr. Langdon appears stable. He will continue monthly Sandostatin. We checked a chromogranin A level today. He will return for an office visit in 3 months. We will consider obtaining a restaging CT 6-9 months from the radioembolization procedure.  Betsy Coder, MD  02/18/2015  12:04 PM

## 2015-02-18 NOTE — Telephone Encounter (Signed)
lft msg for pt confirming labs/ov/injection per 02/29 POF, mailed sch to pt..... KJ

## 2015-02-22 LAB — CHROMOGRANIN A: CHROMOGRANIN A: 148 ng/mL — AB (ref <=15)

## 2015-02-25 ENCOUNTER — Telehealth: Payer: Self-pay | Admitting: *Deleted

## 2015-02-25 NOTE — Telephone Encounter (Signed)
-----   Message from Ladell Pier, MD sent at 02/23/2015  8:05 AM EST ----- Please call patient, chromagranin A is stable

## 2015-02-25 NOTE — Telephone Encounter (Signed)
Called pt with stable Chromagranin A results. He voiced understanding, will follow up a scheduled.

## 2015-03-18 ENCOUNTER — Other Ambulatory Visit: Payer: Self-pay | Admitting: Oncology

## 2015-03-18 ENCOUNTER — Ambulatory Visit (HOSPITAL_BASED_OUTPATIENT_CLINIC_OR_DEPARTMENT_OTHER): Payer: 59

## 2015-03-18 DIAGNOSIS — C7A012 Malignant carcinoid tumor of the ileum: Secondary | ICD-10-CM | POA: Diagnosis not present

## 2015-03-18 DIAGNOSIS — C7A021 Malignant carcinoid tumor of the cecum: Secondary | ICD-10-CM

## 2015-03-18 DIAGNOSIS — E340 Carcinoid syndrome: Secondary | ICD-10-CM | POA: Diagnosis not present

## 2015-03-18 MED ORDER — OCTREOTIDE ACETATE 20 MG IM KIT
20.0000 mg | PACK | Freq: Once | INTRAMUSCULAR | Status: AC
  Administered 2015-03-18: 20 mg via INTRAMUSCULAR
  Filled 2015-03-18: qty 1

## 2015-04-02 ENCOUNTER — Other Ambulatory Visit: Payer: Self-pay | Admitting: Family Medicine

## 2015-04-09 ENCOUNTER — Ambulatory Visit: Payer: 59 | Admitting: Family Medicine

## 2015-04-09 ENCOUNTER — Other Ambulatory Visit: Payer: Self-pay | Admitting: Family Medicine

## 2015-04-16 ENCOUNTER — Other Ambulatory Visit: Payer: Self-pay | Admitting: Family Medicine

## 2015-04-19 ENCOUNTER — Ambulatory Visit (HOSPITAL_BASED_OUTPATIENT_CLINIC_OR_DEPARTMENT_OTHER): Payer: 59

## 2015-04-19 VITALS — BP 139/85 | HR 88 | Temp 98.1°F

## 2015-04-19 DIAGNOSIS — C7A021 Malignant carcinoid tumor of the cecum: Secondary | ICD-10-CM

## 2015-04-19 DIAGNOSIS — C7A012 Malignant carcinoid tumor of the ileum: Secondary | ICD-10-CM | POA: Diagnosis not present

## 2015-04-19 MED ORDER — OCTREOTIDE ACETATE 20 MG IM KIT
20.0000 mg | PACK | Freq: Once | INTRAMUSCULAR | Status: AC
  Administered 2015-04-19: 20 mg via INTRAMUSCULAR
  Filled 2015-04-19: qty 1

## 2015-04-29 ENCOUNTER — Encounter: Payer: Self-pay | Admitting: Family Medicine

## 2015-04-29 ENCOUNTER — Ambulatory Visit (INDEPENDENT_AMBULATORY_CARE_PROVIDER_SITE_OTHER): Payer: 59 | Admitting: Family Medicine

## 2015-04-29 VITALS — BP 148/90 | Ht 73.0 in | Wt 208.0 lb

## 2015-04-29 DIAGNOSIS — I10 Essential (primary) hypertension: Secondary | ICD-10-CM

## 2015-04-29 DIAGNOSIS — N5201 Erectile dysfunction due to arterial insufficiency: Secondary | ICD-10-CM

## 2015-04-29 DIAGNOSIS — K219 Gastro-esophageal reflux disease without esophagitis: Secondary | ICD-10-CM | POA: Diagnosis not present

## 2015-04-29 DIAGNOSIS — K589 Irritable bowel syndrome without diarrhea: Secondary | ICD-10-CM | POA: Diagnosis not present

## 2015-04-29 MED ORDER — LOSARTAN POTASSIUM 100 MG PO TABS: 100.0000 mg | ORAL_TABLET | Freq: Every day | ORAL | Status: DC

## 2015-04-29 MED ORDER — HYOSCYAMINE SULFATE ER 0.375 MG PO TB12: 0.3750 mg | ORAL_TABLET | Freq: Two times a day (BID) | ORAL | Status: DC

## 2015-04-29 MED ORDER — AMLODIPINE BESYLATE 10 MG PO TABS: 10.0000 mg | ORAL_TABLET | Freq: Every day | ORAL | Status: DC

## 2015-04-29 MED ORDER — HYDROCHLOROTHIAZIDE 25 MG PO TABS: ORAL_TABLET | ORAL | Status: DC

## 2015-04-29 MED ORDER — POTASSIUM CHLORIDE CRYS ER 20 MEQ PO TBCR: 20.0000 meq | EXTENDED_RELEASE_TABLET | Freq: Every day | ORAL | Status: DC

## 2015-04-29 NOTE — Progress Notes (Signed)
   Subjective:    Patient ID: Tommy Conley, male    DOB: January 21, 1951, 64 y.o.   MRN: [id number]  Hypertension This is a chronic problem. The current episode started more than 1 year ago. The problem has been gradually improving since onset. There are no associated agents to hypertension. There are no known risk factors for coronary artery disease. Treatments tried: amlodipine, losartan. The current treatment provides significant improvement. There are no compliance problems.    Patient states that his tongue is swelling and this has been present for about 1 year now. The swelling is not sudden as in angioedema. It is a linear prominence of tissue on both sides of the time. It does seem to wax and wane and now is definitely worsening.  Did not take bp med this morn  Ongoing challenges with erections. Patient states the Viagra definitely helps. No obvious side effects.  Continues to receive therapy for his carcinoid. Handling treatments well.  Review of notes from those visits show blood pressure elevated there are also   Almost constant thing e ery day with the swelling    Review of Systems No headache no chest pain no back pain no abdominal pain no change in bowel habits    Objective:   Physical Exam Alert no acute distress. HEENT normal except for linear hypertrophic prominence lateral tongue. Lungs clear. Heart regular in rhythm.       Assessment & Plan:  Impression 1 hypertension suboptimal control #2 irritable bowel syndrome. Patient continues to use twice a day Levsin with good success #3 erectile dysfunction discussed #4 hypertrophy of tongue nonspecific plan offered ENT referral patient to hold off. All medications refilled. Diet exercise discussed. Add hydrochlorothiazide one half tablet daily WSL

## 2015-05-11 ENCOUNTER — Other Ambulatory Visit: Payer: Self-pay | Admitting: Family Medicine

## 2015-05-17 ENCOUNTER — Ambulatory Visit (HOSPITAL_BASED_OUTPATIENT_CLINIC_OR_DEPARTMENT_OTHER): Payer: 59 | Admitting: Nurse Practitioner

## 2015-05-17 ENCOUNTER — Ambulatory Visit: Payer: 59

## 2015-05-17 ENCOUNTER — Ambulatory Visit (HOSPITAL_BASED_OUTPATIENT_CLINIC_OR_DEPARTMENT_OTHER): Payer: 59

## 2015-05-17 ENCOUNTER — Telehealth: Payer: Self-pay | Admitting: Oncology

## 2015-05-17 ENCOUNTER — Other Ambulatory Visit (HOSPITAL_BASED_OUTPATIENT_CLINIC_OR_DEPARTMENT_OTHER): Payer: 59

## 2015-05-17 VITALS — BP 138/90 | HR 87 | Temp 98.0°F | Resp 18 | Ht 73.0 in | Wt 208.6 lb

## 2015-05-17 DIAGNOSIS — C7B02 Secondary carcinoid tumors of liver: Secondary | ICD-10-CM

## 2015-05-17 DIAGNOSIS — I10 Essential (primary) hypertension: Secondary | ICD-10-CM

## 2015-05-17 DIAGNOSIS — C7A012 Malignant carcinoid tumor of the ileum: Secondary | ICD-10-CM | POA: Diagnosis not present

## 2015-05-17 DIAGNOSIS — C7A021 Malignant carcinoid tumor of the cecum: Secondary | ICD-10-CM

## 2015-05-17 DIAGNOSIS — D509 Iron deficiency anemia, unspecified: Secondary | ICD-10-CM | POA: Diagnosis not present

## 2015-05-17 LAB — COMPREHENSIVE METABOLIC PANEL (CC13)
ALBUMIN: 3.2 g/dL — AB (ref 3.5–5.0)
ALK PHOS: 160 U/L — AB (ref 40–150)
ALT: 23 U/L (ref 0–55)
ANION GAP: 8 meq/L (ref 3–11)
AST: 22 U/L (ref 5–34)
BUN: 8.6 mg/dL (ref 7.0–26.0)
CALCIUM: 8.7 mg/dL (ref 8.4–10.4)
CHLORIDE: 108 meq/L (ref 98–109)
CO2: 24 mEq/L (ref 22–29)
Creatinine: 0.9 mg/dL (ref 0.7–1.3)
Glucose: 127 mg/dl (ref 70–140)
Potassium: 3.4 mEq/L — ABNORMAL LOW (ref 3.5–5.1)
Sodium: 139 mEq/L (ref 136–145)
Total Bilirubin: 0.36 mg/dL (ref 0.20–1.20)
Total Protein: 6.5 g/dL (ref 6.4–8.3)

## 2015-05-17 MED ORDER — OCTREOTIDE ACETATE 20 MG IM KIT
20.0000 mg | PACK | Freq: Once | INTRAMUSCULAR | Status: AC
  Administered 2015-05-17: 20 mg via INTRAMUSCULAR
  Filled 2015-05-17: qty 1

## 2015-05-17 NOTE — Progress Notes (Signed)
[place] OFFICE PROGRESS NOTE   Diagnosis:  Carcinoid tumor  INTERVAL HISTORY:   [name] returns as scheduled. He continues monthly Sandostatin. He denies diarrhea. No flushing. No abdominal pain. He reports a good appetite. No cough or shortness of breath. He reports that he feels well.  Objective:  Vital signs in last 24 hours:  Blood pressure 138/90, pulse 87, temperature 98 F (36.7 C), temperature source Oral, resp. rate 18, height 6\' 1"  (1.854 m), weight 208 lb 9.6 oz (94.62 kg), SpO2 100 %.    HEENT: No thrush or ulcers. Lymphatics: No palpable cervical, supra clavicular axillary lymph nodes. Resp: Lungs clear bilaterally. Cardio: Regular rate and rhythm. GI: Abdomen soft and nontender. No organomegaly. No mass. Vascular: No leg edema.    Lab Results:  Lab Results  Component Value Date   WBC 6.0 01/03/2015   HGB 13.3 01/03/2015   HCT 40.5 01/03/2015   MCV 83.0 01/03/2015   PLT 198 01/03/2015   NEUTROABS 3.4 01/03/2015    Imaging:  No results found.  Medications: I have reviewed the patient's current medications.  Assessment/Plan: 1. 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  1. Status post a right colon/ileum resection on 09/29/2011.  2. Wedge biopsy of a right liver lesion on 09/29/2011 confirmed metastatic well-differentiated neuroendocrine carcinoma.  3. CT of the abdomen 08/25/2011 consistent with multiple liver metastases.  4. Elevated preoperative chromogranin A level and a 24-hour urine 5-HIAA level. Stable on repeat 11/16/2011. 5. Restaging CT on 12/28/2011 confirmed slight progression of the metastatic liver lesions. 6. Status post 3 cycles of Xeloda/temozolomide with cycle 3 initiated on 03/12/2012 7. Restaging CT of the abdomen 04/04/2012 revealed slight progression of the metastatic liver lesions and transverse mesocolon  adenopathy. 8. Status post Y-90 radioembolization of the right liver on 05/19/2012. 9. Status post Y-90 radioembolization of the left liver on 07/06/2012. 10. Chromogranin A level decreased on 08/29/2012, 24-hour urine 5 HIAA slightly increased on 09/05/2012 11. Restaging CT on 12/05/2012 with a mixed response in the liver 12. Restaging CT 08/11/2013-mild increase in the size of liver lesions. 13. Restaging CT 01/22/2014-stable to slightly improved liver lesions, mild increase in mesenteric adenopathy. Chromogranin A level stable. 14. Chromogranin A level stable 05/01/2014. 15. Chromogranin A higher on 08/02/2014 16. Restaging CT 11/06/2014 with interval enlargement of the enhancing lesions within the left and right hepatic lobe. 17. Initiation of monthly Sandostatin 12/24/2014 18. Status post Y-90 radioembolization of the right liver on 01/03/2015 19. Chromogranin A level stable 02/18/2015 2. History of diarrhea (3 to 4 times per day), predating surgery for 8 months. Decrease in daily bowel movements coinciding with discontinuation of Nexium. 3. Hypertension. 4. History of gastroesophageal reflux disease. No reflux symptoms since discontinuing Nexium. 5. Rectal and ascending colon polyps. He underwent a colonoscopy on 12/20/2012 with findings of a sessile polyp in the ascending colon and a sessile polyp in the rectum. Pathology showed hyperplastic polyps. Prior right colon resection noted with a normal appearing ileocolonic anastomosis. Small internal hemorrhoids noted. Repeat colonoscopy in 5 years. 6. History of Iron deficiency anemia-improved.   Disposition: Mr. Pring appears stable. He will continue monthly Sandostatin. We will follow-up on the chromogranin A level from today. He will return for an office visit in 3 months with a restaging CT 1 week prior to that visit. He will contact the office in the interim with any problems.   Plan reviewed with Dr. Benay Spice.  Ned Card  ANP/GNP-BC   05/17/2015  12:30 PM

## 2015-05-17 NOTE — Telephone Encounter (Signed)
Pt confirmed labs/ov/inj per 05/27 POF, gave pt AVS and Calendar.... KJ

## 2015-05-17 NOTE — Telephone Encounter (Signed)
Pt confirmed labs/ov per 05/27 POF, gave pt AVS and Calendar.... KJ, gave barium to pt

## 2015-05-22 LAB — CHROMOGRANIN A: CHROMOGRANIN A: 194 ng/mL — AB (ref <=15)

## 2015-05-23 ENCOUNTER — Telehealth: Payer: Self-pay | Admitting: *Deleted

## 2015-05-23 NOTE — Telephone Encounter (Signed)
-----   Message from Ladell Pier, MD sent at 05/23/2015  7:40 AM EDT ----- Please call patient, chromagranin is stable

## 2015-05-28 ENCOUNTER — Telehealth: Payer: Self-pay | Admitting: *Deleted

## 2015-05-28 NOTE — Telephone Encounter (Signed)
Per Dr. Benay Spice; notified pt chromagranin is stable, f/u as scheduled.  Pt verbalized understanding.

## 2015-05-28 NOTE — Telephone Encounter (Signed)
-----   Message from Ladell Pier, MD sent at 05/23/2015  7:40 AM EDT ----- Please call patient, chromagranin is stable

## 2015-06-17 ENCOUNTER — Ambulatory Visit (HOSPITAL_BASED_OUTPATIENT_CLINIC_OR_DEPARTMENT_OTHER): Payer: 59

## 2015-06-17 VITALS — BP 133/93 | HR 102 | Temp 98.4°F | Resp 18

## 2015-06-17 DIAGNOSIS — C7A021 Malignant carcinoid tumor of the cecum: Secondary | ICD-10-CM

## 2015-06-17 DIAGNOSIS — C7A012 Malignant carcinoid tumor of the ileum: Secondary | ICD-10-CM | POA: Diagnosis not present

## 2015-06-17 DIAGNOSIS — C7B02 Secondary carcinoid tumors of liver: Secondary | ICD-10-CM | POA: Diagnosis not present

## 2015-06-17 MED ORDER — OCTREOTIDE ACETATE 20 MG IM KIT
20.0000 mg | PACK | Freq: Once | INTRAMUSCULAR | Status: AC
  Administered 2015-06-17: 20 mg via INTRAMUSCULAR
  Filled 2015-06-17: qty 1

## 2015-06-20 ENCOUNTER — Other Ambulatory Visit: Payer: Self-pay | Admitting: Family Medicine

## 2015-06-21 ENCOUNTER — Other Ambulatory Visit: Payer: Self-pay | Admitting: Family Medicine

## 2015-07-19 ENCOUNTER — Ambulatory Visit (HOSPITAL_BASED_OUTPATIENT_CLINIC_OR_DEPARTMENT_OTHER): Payer: Commercial Managed Care - HMO

## 2015-07-19 VITALS — BP 127/83 | HR 99 | Temp 97.9°F

## 2015-07-19 DIAGNOSIS — C7B02 Secondary carcinoid tumors of liver: Secondary | ICD-10-CM | POA: Diagnosis not present

## 2015-07-19 DIAGNOSIS — C7A012 Malignant carcinoid tumor of the ileum: Secondary | ICD-10-CM | POA: Diagnosis not present

## 2015-07-19 DIAGNOSIS — C7A021 Malignant carcinoid tumor of the cecum: Secondary | ICD-10-CM

## 2015-07-19 MED ORDER — OCTREOTIDE ACETATE 20 MG IM KIT
20.0000 mg | PACK | Freq: Once | INTRAMUSCULAR | Status: AC
  Administered 2015-07-19: 20 mg via INTRAMUSCULAR
  Filled 2015-07-19: qty 1

## 2015-07-24 ENCOUNTER — Other Ambulatory Visit: Payer: Self-pay | Admitting: Family Medicine

## 2015-07-25 ENCOUNTER — Other Ambulatory Visit: Payer: Self-pay | Admitting: *Deleted

## 2015-07-25 MED ORDER — HYDROCORTISONE ACE-PRAMOXINE 1-1 % RE CREA: 1.0000 "application " | TOPICAL_CREAM | Freq: Three times a day (TID) | RECTAL | Status: DC

## 2015-08-12 ENCOUNTER — Other Ambulatory Visit (HOSPITAL_BASED_OUTPATIENT_CLINIC_OR_DEPARTMENT_OTHER): Payer: Commercial Managed Care - HMO

## 2015-08-12 DIAGNOSIS — C7B02 Secondary carcinoid tumors of liver: Secondary | ICD-10-CM | POA: Diagnosis not present

## 2015-08-12 DIAGNOSIS — C7A012 Malignant carcinoid tumor of the ileum: Secondary | ICD-10-CM | POA: Diagnosis not present

## 2015-08-12 DIAGNOSIS — C7A021 Malignant carcinoid tumor of the cecum: Secondary | ICD-10-CM

## 2015-08-12 LAB — COMPREHENSIVE METABOLIC PANEL (CC13)
ALBUMIN: 3.5 g/dL (ref 3.5–5.0)
ALK PHOS: 110 U/L (ref 40–150)
ALT: 17 U/L (ref 0–55)
AST: 21 U/L (ref 5–34)
Anion Gap: 11 mEq/L (ref 3–11)
BILIRUBIN TOTAL: 0.63 mg/dL (ref 0.20–1.20)
BUN: 9.1 mg/dL (ref 7.0–26.0)
CO2: 26 mEq/L (ref 22–29)
Calcium: 9.4 mg/dL (ref 8.4–10.4)
Chloride: 106 mEq/L (ref 98–109)
Creatinine: 1 mg/dL (ref 0.7–1.3)
EGFR: 88 mL/min/{1.73_m2} — AB (ref >90)
Glucose: 128 mg/dl (ref 70–140)
Potassium: 3.7 mEq/L (ref 3.5–5.1)
Sodium: 144 mEq/L (ref 136–145)
TOTAL PROTEIN: 6.4 g/dL (ref 6.4–8.3)

## 2015-08-19 ENCOUNTER — Telehealth: Payer: Self-pay | Admitting: Oncology

## 2015-08-19 ENCOUNTER — Ambulatory Visit (HOSPITAL_BASED_OUTPATIENT_CLINIC_OR_DEPARTMENT_OTHER): Payer: Commercial Managed Care - HMO | Admitting: Oncology

## 2015-08-19 ENCOUNTER — Ambulatory Visit (HOSPITAL_BASED_OUTPATIENT_CLINIC_OR_DEPARTMENT_OTHER): Payer: Commercial Managed Care - HMO

## 2015-08-19 VITALS — BP 145/93 | HR 86 | Temp 97.9°F | Resp 18 | Ht 73.0 in | Wt 207.6 lb

## 2015-08-19 DIAGNOSIS — C7A021 Malignant carcinoid tumor of the cecum: Secondary | ICD-10-CM

## 2015-08-19 DIAGNOSIS — C7B02 Secondary carcinoid tumors of liver: Secondary | ICD-10-CM | POA: Diagnosis not present

## 2015-08-19 DIAGNOSIS — D509 Iron deficiency anemia, unspecified: Secondary | ICD-10-CM | POA: Diagnosis not present

## 2015-08-19 DIAGNOSIS — I10 Essential (primary) hypertension: Secondary | ICD-10-CM | POA: Diagnosis not present

## 2015-08-19 DIAGNOSIS — C7A012 Malignant carcinoid tumor of the ileum: Secondary | ICD-10-CM

## 2015-08-19 MED ORDER — OCTREOTIDE ACETATE 20 MG IM KIT
20.0000 mg | PACK | Freq: Once | INTRAMUSCULAR | Status: AC
  Administered 2015-08-19: 20 mg via INTRAMUSCULAR
  Filled 2015-08-19: qty 1

## 2015-08-19 NOTE — Telephone Encounter (Signed)
Gave and printed appt sched and avs for pt for Sept °

## 2015-08-19 NOTE — Progress Notes (Signed)
  [place] OFFICE PROGRESS NOTE   Diagnosis: Carcinoid tumor  INTERVAL HISTORY:   Tommy Conley returns as scheduled. He feels well. No flushing or diarrhea. No complaint. He continues monthly Sandostatin.  Objective:  Vital signs in last 24 hours:  Blood pressure 145/93, pulse 86, temperature 97.9 F (36.6 C), temperature source Oral, resp. rate 18, height 6\' 1"  (1.854 m), weight 207 lb 9.6 oz (94.167 kg), SpO2 100 %.    Resp: Lungs clear bilaterally Cardio: Regular in rhythm GI: No hepatomegaly, nontender Vascular: No leg edema   Lab Results:   Medications: I have reviewed the patient's current medications.  Assessment/Plan: 1. 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  1. Status post a right colon/ileum resection on 09/29/2011.  2. Wedge biopsy of a right liver lesion on 09/29/2011 confirmed metastatic well-differentiated neuroendocrine carcinoma.  3. CT of the abdomen 08/25/2011 consistent with multiple liver metastases.  4. Elevated preoperative chromogranin A level and a 24-hour urine 5-HIAA level. Stable on repeat 11/16/2011. 5. Restaging CT on 12/28/2011 confirmed slight progression of the metastatic liver lesions. 6. Status post 3 cycles of Xeloda/temozolomide with cycle 3 initiated on 03/12/2012 7. Restaging CT of the abdomen 04/04/2012 revealed slight progression of the metastatic liver lesions and transverse mesocolon adenopathy. 8. Status post Y-90 radioembolization of the right liver on 05/19/2012. 9. Status post Y-90 radioembolization of the left liver on 07/06/2012. 10. Chromogranin A level decreased on 08/29/2012, 24-hour urine 5 HIAA slightly increased on 09/05/2012 11. Restaging CT on 12/05/2012 with a mixed response in the liver 12. Restaging CT 08/11/2013-mild increase in the size of liver lesions. 13. Restaging CT 01/22/2014-stable to slightly improved  liver lesions, mild increase in mesenteric adenopathy. Chromogranin A level stable. 14. Chromogranin A level stable 05/01/2014. 15. Chromogranin A higher on 08/02/2014 16. Restaging CT 11/06/2014 with interval enlargement of the enhancing lesions within the left and right hepatic lobe. 17. Initiation of monthly Sandostatin 12/24/2014 18. Status post Y-90 radioembolization of the right liver on 01/03/2015 19. Chromogranin A level stable 02/18/2015 2. History of diarrhea (3 to 4 times per day), predating surgery for 8 months. Decrease in daily bowel movements coinciding with discontinuation of Nexium. 3. Hypertension. 4. History of gastroesophageal reflux disease. No reflux symptoms since discontinuing Nexium. 5. Rectal and ascending colon polyps. He underwent a colonoscopy on 12/20/2012 with findings of a sessile polyp in the ascending colon and a sessile polyp in the rectum. Pathology showed hyperplastic polyps. Prior right colon resection noted with a normal appearing ileocolonic anastomosis. Small internal hemorrhoids noted. Repeat colonoscopy in 5 years. 6. History of Iron deficiency anemia-improved.  Disposition:  Tommy Conley appears stable. He will continue monthly Sandostatin. A restaging CT was ordered for prior to this visit, but was not scheduled. He will be scheduled for a restaging CT of the abdomen and pelvis prior to an office visit in one month.  Betsy Coder, MD  08/19/2015  12:49 PM

## 2015-08-21 LAB — CHROMOGRANIN A: Chromogranin A: 127 ng/mL — ABNORMAL HIGH (ref <=15)

## 2015-08-22 ENCOUNTER — Encounter: Payer: Self-pay | Admitting: Family Medicine

## 2015-08-22 ENCOUNTER — Telehealth: Payer: Self-pay | Admitting: *Deleted

## 2015-08-22 ENCOUNTER — Ambulatory Visit (INDEPENDENT_AMBULATORY_CARE_PROVIDER_SITE_OTHER): Payer: Commercial Managed Care - HMO | Admitting: Family Medicine

## 2015-08-22 VITALS — BP 128/88 | Temp 97.9°F | Ht 73.0 in | Wt 201.0 lb

## 2015-08-22 DIAGNOSIS — K611 Rectal abscess: Secondary | ICD-10-CM | POA: Diagnosis not present

## 2015-08-22 MED ORDER — DOXYCYCLINE HYCLATE 100 MG PO TABS: 100.0000 mg | ORAL_TABLET | Freq: Two times a day (BID) | ORAL | Status: DC

## 2015-08-22 NOTE — Progress Notes (Signed)
   Subjective:    Patient ID: Tommy Conley, male    DOB: [birth year], 64 y.o.   MRN: [id number]  HPIhaving burning low abd pain.   Took long trip on a car , no problem at first, then developed sig hemmorhoidal swelling  Ba d pain hit about a week ago,  Pt used ointment helped some, but not a lotStarted 3 days ago. Taking pepto. Mild relief.   Check hemorrhoids.   Pain fairly severe at times  Still has hem tenderness and pain  Pt had low abd pain with buning discomfort      Review of Systems No headache no chest pain no back pain no change in urinary or bowel habits    Objective:   Physical Exam  Alert vitals stable lungs clear. Heart regular in rhythm. Abdomen benign. Positive perirectal abscess noted, has already drained minimal hemorrhoids.      Assessment & Plan:  Impression partially drained perirectal abscess plan symptom care discussed. Antibiotics prescribed. WSL

## 2015-08-22 NOTE — Telephone Encounter (Signed)
-----   Message from Ladell Pier, MD sent at 08/22/2015  2:00 PM EDT ----- Please call patient, chromagranin is better

## 2015-08-22 NOTE — Telephone Encounter (Signed)
Pt returned call, informed him Chromagranin A looks better. He voiced understanding.

## 2015-09-11 ENCOUNTER — Ambulatory Visit (HOSPITAL_COMMUNITY)
Admission: RE | Admit: 2015-09-11 | Discharge: 2015-09-11 | Disposition: A | Discharge status: Home / Self Care | Payer: Commercial Managed Care - HMO | Source: Ambulatory Visit | Attending: Nurse Practitioner | Admitting: Nurse Practitioner

## 2015-09-11 ENCOUNTER — Encounter (HOSPITAL_COMMUNITY): Payer: Self-pay

## 2015-09-11 DIAGNOSIS — Z9049 Acquired absence of other specified parts of digestive tract: Secondary | ICD-10-CM | POA: Insufficient documentation

## 2015-09-11 DIAGNOSIS — C7B02 Secondary carcinoid tumors of liver: Secondary | ICD-10-CM | POA: Diagnosis not present

## 2015-09-11 DIAGNOSIS — N400 Enlarged prostate without lower urinary tract symptoms: Secondary | ICD-10-CM | POA: Insufficient documentation

## 2015-09-11 DIAGNOSIS — C7A021 Malignant carcinoid tumor of the cecum: Secondary | ICD-10-CM | POA: Diagnosis not present

## 2015-09-11 DIAGNOSIS — R911 Solitary pulmonary nodule: Secondary | ICD-10-CM | POA: Insufficient documentation

## 2015-09-11 DIAGNOSIS — R590 Localized enlarged lymph nodes: Secondary | ICD-10-CM | POA: Insufficient documentation

## 2015-09-11 MED ORDER — IOHEXOL 300 MG/ML  SOLN
100.0000 mL | Freq: Once | INTRAMUSCULAR | Status: AC | PRN
  Administered 2015-09-11: 100 mL via INTRAVENOUS

## 2015-09-16 ENCOUNTER — Ambulatory Visit (HOSPITAL_BASED_OUTPATIENT_CLINIC_OR_DEPARTMENT_OTHER): Payer: Commercial Managed Care - HMO

## 2015-09-16 ENCOUNTER — Ambulatory Visit (HOSPITAL_BASED_OUTPATIENT_CLINIC_OR_DEPARTMENT_OTHER): Payer: Commercial Managed Care - HMO | Admitting: Nurse Practitioner

## 2015-09-16 ENCOUNTER — Telehealth: Payer: Self-pay | Admitting: Oncology

## 2015-09-16 VITALS — BP 156/91 | HR 72 | Temp 98.4°F | Resp 18 | Ht 73.0 in | Wt 209.0 lb

## 2015-09-16 DIAGNOSIS — C7A021 Malignant carcinoid tumor of the cecum: Secondary | ICD-10-CM

## 2015-09-16 DIAGNOSIS — C7B02 Secondary carcinoid tumors of liver: Secondary | ICD-10-CM

## 2015-09-16 DIAGNOSIS — C7A012 Malignant carcinoid tumor of the ileum: Secondary | ICD-10-CM | POA: Diagnosis not present

## 2015-09-16 MED ORDER — OCTREOTIDE ACETATE 20 MG IM KIT
20.0000 mg | PACK | Freq: Once | INTRAMUSCULAR | Status: AC
  Administered 2015-09-16: 20 mg via INTRAMUSCULAR
  Filled 2015-09-16: qty 1

## 2015-09-16 NOTE — Progress Notes (Addendum)
[place] OFFICE PROGRESS NOTE   Diagnosis:  Carcinoid tumor  INTERVAL HISTORY:   Tommy Conley returns as scheduled. He continues monthly Sandostatin. No diarrhea or flushing. No abdominal pain. He has a good appetite. He overall feels well.  Objective:  Vital signs in last 24 hours:  Blood pressure 156/91, pulse 72, temperature 98.4 F (36.9 C), temperature source Oral, resp. rate 18, height 6\' 1"  (1.854 m), weight 209 lb (94.802 kg), SpO2 100 %.    HEENT: No thrush or ulcers. Resp: Lungs clear bilaterally. Cardio: Regular rate and rhythm. GI: Abdomen soft and nontender. No hepatomegaly. Vascular: No leg edema.  Lab Results:  Lab Results  Component Value Date   WBC 6.0 01/03/2015   HGB 13.3 01/03/2015   HCT 40.5 01/03/2015   MCV 83.0 01/03/2015   PLT 198 01/03/2015   NEUTROABS 3.4 01/03/2015   08/12/2015 chromogranin A 127 (194 on 05/17/2015)  Imaging:  No results found.  Medications: I have reviewed the patient's current medications.  Assessment/Plan: 1. 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  1. Status post a right colon/ileum resection on 09/29/2011.  2. Wedge biopsy of a right liver lesion on 09/29/2011 confirmed metastatic well-differentiated neuroendocrine carcinoma.  3. CT of the abdomen 08/25/2011 consistent with multiple liver metastases.  4. Elevated preoperative chromogranin A level and a 24-hour urine 5-HIAA level. Stable on repeat 11/16/2011. 5. Restaging CT on 12/28/2011 confirmed slight progression of the metastatic liver lesions. 6. Status post 3 cycles of Xeloda/temozolomide with cycle 3 initiated on 03/12/2012 7. Restaging CT of the abdomen 04/04/2012 revealed slight progression of the metastatic liver lesions and transverse mesocolon adenopathy. 8. Status post Y-90 radioembolization of the right liver on 05/19/2012. 9. Status post Y-90  radioembolization of the left liver on 07/06/2012. 10. Chromogranin A level decreased on 08/29/2012, 24-hour urine 5 HIAA slightly increased on 09/05/2012 11. Restaging CT on 12/05/2012 with a mixed response in the liver 12. Restaging CT 08/11/2013-mild increase in the size of liver lesions. 13. Restaging CT 01/22/2014-stable to slightly improved liver lesions, mild increase in mesenteric adenopathy. Chromogranin A level stable. 14. Chromogranin A level stable 05/01/2014. 15. Chromogranin A higher on 08/02/2014 16. Restaging CT 11/06/2014 with interval enlargement of the enhancing lesions within the left and right hepatic lobe. 17. Initiation of monthly Sandostatin 12/24/2014 18. Status post Y-90 radioembolization of the right liver on 01/03/2015 19. Chromogranin A level stable 02/18/2015 20. Chromogranin A level improved 08/12/2015 21. Restaging CT 09/11/2015 with mild increase in the size of the dominant liver metastasis in the right hepatic lobe. Other smaller liver metastases showed no significant change. Stable mild lymphadenopathy in central small bowel mesentery and right cardiophrenic angle. Stable subcm right middle lobe pulmonary nodule. 2. History of diarrhea (3 to 4 times per day), predating surgery for 8 months. Decrease in daily bowel movements coinciding with discontinuation of Nexium. 3. Hypertension. 4. History of gastroesophageal reflux disease. No reflux symptoms since discontinuing Nexium. 5. Rectal and ascending colon polyps. He underwent a colonoscopy on 12/20/2012 with findings of a sessile polyp in the ascending colon and a sessile polyp in the rectum. Pathology showed hyperplastic polyps. Prior right colon resection noted with a normal appearing ileocolonic anastomosis. Small internal hemorrhoids noted. Repeat colonoscopy in 5 years. 6. History of Iron deficiency anemia-improved.  Disposition: Tommy Conley appears well. The recent restaging CT scan showed a mild increase in  the size of the dominant liver metastasis in  the right hepatic lobe. Other liver lesions were stable and there was no new disease. Dr. Benay Spice recommends continuation of monthly Sandostatin.  Tommy Conley will return for a follow-up visit and chromogranin A level in 3 months. He will contact the office in the interim with any problems.  Patient seen with Dr. Benay Spice.  Ned Card ANP/GNP-BC   09/16/2015  11:10 AM  This was a shared visit with Ned Card. We reviewed the restaging CT findings with Tommy Conley. There has been minimal progression in the liver. The plan is to continue monthly Sandostatin.  Julieanne Manson, M.D.

## 2015-09-16 NOTE — Telephone Encounter (Signed)
Gave patient avs report and appointments for October thru December  °

## 2015-09-19 ENCOUNTER — Other Ambulatory Visit: Payer: Self-pay | Admitting: Family Medicine

## 2015-09-19 MED ORDER — DOXYCYCLINE HYCLATE 100 MG PO TABS: 100.0000 mg | ORAL_TABLET | Freq: Two times a day (BID) | ORAL | Status: DC

## 2015-09-19 NOTE — Telephone Encounter (Signed)
Reissue antibiotics and set appt for Friday (currently 4:40)

## 2015-09-19 NOTE — Telephone Encounter (Signed)
Patient was seen on 08/22/15 with hemorrhoids and finished up the antibotics and states pain is back. Can you call in something else or does he need to be seen again.

## 2015-09-19 NOTE — Telephone Encounter (Signed)
patient seen with perirectal abscess and given doxycycline

## 2015-09-19 NOTE — Telephone Encounter (Signed)
Rx sent electronically to pharmacy. Patient scheduled follow up office visit for tomm for recheck.

## 2015-09-20 ENCOUNTER — Ambulatory Visit (INDEPENDENT_AMBULATORY_CARE_PROVIDER_SITE_OTHER): Payer: Commercial Managed Care - HMO | Admitting: Family Medicine

## 2015-09-20 ENCOUNTER — Encounter: Payer: Self-pay | Admitting: Family Medicine

## 2015-09-20 VITALS — BP 138/94 | Temp 97.8°F | Ht 73.0 in | Wt 207.0 lb

## 2015-09-20 DIAGNOSIS — K611 Rectal abscess: Secondary | ICD-10-CM

## 2015-09-20 DIAGNOSIS — Z23 Encounter for immunization: Secondary | ICD-10-CM

## 2015-09-20 NOTE — Progress Notes (Signed)
   Subjective:    Patient ID: Tommy Conley, male    DOB: 09/16/1951, 64 y.o.   MRN: [id number]  HPI follow up on perirectal abscess. Called yesterday and doxy was sent in to pharm.   Two wks worth of abx  Pt wants flu vaccine today.   Pos painful no drainage or dischare. Overall improved compared to before.   Review of Systems No fever no chills no headache no chest pain    Objective:   Physical Exam Alert vital stable lungs clear heart rare rhythm perirectal region small nodular area slightly tender at most no active discharge       Assessment & Plan:  Impression post abscess fibrosis discussed not a mass in need of excision etc. multiple questions asked by anxious patient who understandably is so because of his history of cancer other questions asked regarding liver function tests and liver function WSL

## 2015-10-14 ENCOUNTER — Ambulatory Visit (HOSPITAL_BASED_OUTPATIENT_CLINIC_OR_DEPARTMENT_OTHER): Payer: Commercial Managed Care - HMO

## 2015-10-14 VITALS — BP 129/94 | HR 81 | Temp 98.4°F

## 2015-10-14 DIAGNOSIS — C7A012 Malignant carcinoid tumor of the ileum: Secondary | ICD-10-CM | POA: Diagnosis not present

## 2015-10-14 DIAGNOSIS — C7B02 Secondary carcinoid tumors of liver: Secondary | ICD-10-CM | POA: Diagnosis not present

## 2015-10-14 DIAGNOSIS — C7A021 Malignant carcinoid tumor of the cecum: Secondary | ICD-10-CM

## 2015-10-14 MED ORDER — OCTREOTIDE ACETATE 20 MG IM KIT
20.0000 mg | PACK | Freq: Once | INTRAMUSCULAR | Status: AC
  Administered 2015-10-14: 20 mg via INTRAMUSCULAR
  Filled 2015-10-14: qty 1

## 2015-10-15 ENCOUNTER — Other Ambulatory Visit: Payer: Self-pay | Admitting: Family Medicine

## 2015-10-30 ENCOUNTER — Encounter: Payer: Self-pay | Admitting: Family Medicine

## 2015-10-30 ENCOUNTER — Ambulatory Visit (INDEPENDENT_AMBULATORY_CARE_PROVIDER_SITE_OTHER): Payer: Commercial Managed Care - HMO | Admitting: Family Medicine

## 2015-10-30 VITALS — BP 146/100 | Ht 73.0 in | Wt 211.0 lb

## 2015-10-30 DIAGNOSIS — I10 Essential (primary) hypertension: Secondary | ICD-10-CM

## 2015-10-30 DIAGNOSIS — Z79899 Other long term (current) drug therapy: Secondary | ICD-10-CM | POA: Diagnosis not present

## 2015-10-30 DIAGNOSIS — Z13220 Encounter for screening for lipoid disorders: Secondary | ICD-10-CM | POA: Diagnosis not present

## 2015-10-30 DIAGNOSIS — K580 Irritable bowel syndrome with diarrhea: Secondary | ICD-10-CM

## 2015-10-30 DIAGNOSIS — Z139 Encounter for screening, unspecified: Secondary | ICD-10-CM

## 2015-10-30 DIAGNOSIS — B349 Viral infection, unspecified: Secondary | ICD-10-CM

## 2015-10-30 MED ORDER — AMLODIPINE BESYLATE 10 MG PO TABS: 10.0000 mg | ORAL_TABLET | Freq: Every day | ORAL | Status: DC

## 2015-10-30 MED ORDER — LOSARTAN POTASSIUM 100 MG PO TABS: ORAL_TABLET | ORAL | Status: DC

## 2015-10-30 MED ORDER — HYDROCHLOROTHIAZIDE 25 MG PO TABS: ORAL_TABLET | ORAL | Status: DC

## 2015-10-30 MED ORDER — POTASSIUM CHLORIDE CRYS ER 20 MEQ PO TBCR: 20.0000 meq | EXTENDED_RELEASE_TABLET | Freq: Every day | ORAL | Status: DC

## 2015-10-30 NOTE — Progress Notes (Signed)
   Subjective:    Patient ID: Tommy Conley, male    DOB: August 31, 1951, 64 y.o.   MRN: [id number]  Hypertension This is a chronic problem. There are no compliance problems.    patient compliant blood pressure medicine medicine reviewed today. No obvious side effects. Does not miss a dose.   Patient has c/o sinus drainage, and cough, strted  This past weekend. Cong and drainage and cough. Notes cough is somewhat productive  Dim enrgy achey, stuffy head and now persistent cough   IBS symptoms overall beter, some cramping at times, generally levbid. States the Levbid overall is helping. Meds reviewed. Takes faithfully.  Continues to deal with his carcinoid cancer. It is involving the liver, see prior notes due to follow-up with specialist in this regard  .   Review of Systems No headache no chest pain no back pain no shortness of breath no change in bowel habits ROS otherwise negative    Objective:   Physical Exam Alert vitals stable blood pressure good on repeat HEENT moderate his congestion frontal Supple lungs clear heart regular in rhythm and benign       Assessment & Plan:  Impression 1 hypertension good control meds reviewed continue same #2 IBS good control meds reviewed continue same #3 URI discuss no need for antibiotics currently #4 carcinoid cancer discussed plan 25 minutes spent most in discussion. Appropriate blood work. Diet exercise discussed recheck in 6 months WSL

## 2015-11-06 ENCOUNTER — Telehealth: Payer: Self-pay | Admitting: Oncology

## 2015-11-06 NOTE — Telephone Encounter (Signed)
Due to [facility] 12/19 appointments moved to 12/27 per BS. Spoke with patient he is aware. Also confirmed 11/21 inj.

## 2015-11-07 LAB — HEPATIC FUNCTION PANEL
ALT: 15 IU/L (ref 0–44)
AST: 21 IU/L (ref 0–40)
Albumin: 4 g/dL (ref 3.6–4.8)
Alkaline Phosphatase: 124 IU/L — ABNORMAL HIGH (ref 39–117)
BILIRUBIN, DIRECT: 0.22 mg/dL (ref 0.00–0.40)
Bilirubin Total: 0.6 mg/dL (ref 0.0–1.2)
Total Protein: 6.6 g/dL (ref 6.0–8.5)

## 2015-11-07 LAB — BASIC METABOLIC PANEL
BUN / CREAT RATIO: 9 — AB (ref 10–22)
BUN: 8 mg/dL (ref 8–27)
CALCIUM: 9.2 mg/dL (ref 8.6–10.2)
CHLORIDE: 101 mmol/L (ref 97–106)
CO2: 25 mmol/L (ref 18–29)
CREATININE: 0.85 mg/dL (ref 0.76–1.27)
GFR calc Af Amer: 106 mL/min/{1.73_m2} (ref >59)
GFR calc non Af Amer: 92 mL/min/{1.73_m2} (ref >59)
GLUCOSE: 96 mg/dL (ref 65–99)
Potassium: 3.7 mmol/L (ref 3.5–5.2)
Sodium: 142 mmol/L (ref 136–144)

## 2015-11-07 LAB — HEPATITIS C ANTIBODY

## 2015-11-07 LAB — LIPID PANEL
CHOL/HDL RATIO: 3.7 ratio (ref 0.0–5.0)
CHOLESTEROL TOTAL: 174 mg/dL (ref 100–199)
HDL: 47 mg/dL (ref >39)
LDL CALC: 110 mg/dL — AB (ref 0–99)
TRIGLYCERIDES: 85 mg/dL (ref 0–149)
VLDL Cholesterol Cal: 17 mg/dL (ref 5–40)

## 2015-11-07 LAB — PSA: PROSTATE SPECIFIC AG, SERUM: 1.5 ng/mL (ref 0.0–4.0)

## 2015-11-07 LAB — COMMENT

## 2015-11-10 ENCOUNTER — Encounter: Payer: Self-pay | Admitting: Family Medicine

## 2015-11-11 ENCOUNTER — Ambulatory Visit (HOSPITAL_BASED_OUTPATIENT_CLINIC_OR_DEPARTMENT_OTHER): Payer: Commercial Managed Care - HMO

## 2015-11-11 VITALS — BP 142/90 | HR 90 | Temp 98.3°F

## 2015-11-11 DIAGNOSIS — C7A012 Malignant carcinoid tumor of the ileum: Secondary | ICD-10-CM | POA: Diagnosis not present

## 2015-11-11 DIAGNOSIS — C7B02 Secondary carcinoid tumors of liver: Secondary | ICD-10-CM

## 2015-11-11 DIAGNOSIS — C7A021 Malignant carcinoid tumor of the cecum: Secondary | ICD-10-CM

## 2015-11-11 MED ORDER — OCTREOTIDE ACETATE 20 MG IM KIT
20.0000 mg | PACK | Freq: Once | INTRAMUSCULAR | Status: AC
  Administered 2015-11-11: 20 mg via INTRAMUSCULAR
  Filled 2015-11-11: qty 1

## 2015-11-15 ENCOUNTER — Other Ambulatory Visit: Payer: Self-pay | Admitting: Family Medicine

## 2015-12-09 ENCOUNTER — Ambulatory Visit: Payer: Commercial Managed Care - HMO | Admitting: Oncology

## 2015-12-09 ENCOUNTER — Other Ambulatory Visit: Payer: Commercial Managed Care - HMO

## 2015-12-09 ENCOUNTER — Ambulatory Visit: Payer: Commercial Managed Care - HMO

## 2015-12-17 ENCOUNTER — Telehealth: Payer: Self-pay | Admitting: Oncology

## 2015-12-17 ENCOUNTER — Ambulatory Visit (HOSPITAL_BASED_OUTPATIENT_CLINIC_OR_DEPARTMENT_OTHER): Payer: Commercial Managed Care - HMO | Admitting: Oncology

## 2015-12-17 ENCOUNTER — Ambulatory Visit (HOSPITAL_BASED_OUTPATIENT_CLINIC_OR_DEPARTMENT_OTHER): Payer: Commercial Managed Care - HMO

## 2015-12-17 ENCOUNTER — Other Ambulatory Visit (HOSPITAL_BASED_OUTPATIENT_CLINIC_OR_DEPARTMENT_OTHER): Payer: Commercial Managed Care - HMO

## 2015-12-17 VITALS — BP 143/92 | HR 89 | Temp 98.5°F | Resp 18 | Ht 73.0 in | Wt 217.6 lb

## 2015-12-17 DIAGNOSIS — C7A012 Malignant carcinoid tumor of the ileum: Secondary | ICD-10-CM

## 2015-12-17 DIAGNOSIS — C7A021 Malignant carcinoid tumor of the cecum: Secondary | ICD-10-CM

## 2015-12-17 DIAGNOSIS — C7B02 Secondary carcinoid tumors of liver: Secondary | ICD-10-CM | POA: Diagnosis not present

## 2015-12-17 MED ORDER — OCTREOTIDE ACETATE 20 MG IM KIT
20.0000 mg | PACK | Freq: Once | INTRAMUSCULAR | Status: AC
  Administered 2015-12-17: 20 mg via INTRAMUSCULAR
  Filled 2015-12-17: qty 1

## 2015-12-17 NOTE — Telephone Encounter (Signed)
Medical records faxed to [facility] @ [phone] for review.  FE:[id number])

## 2015-12-17 NOTE — Progress Notes (Signed)
Tommy Conley   Diagnosis:  Carcinoid tumor  INTERVAL HISTORY:    Tommy Conley returns as scheduled. He continues monthly Sandostatin. He denies diarrhea. He has approximately 1-2 formed bowel movements per day. A few months ago he developed discomfort in the rectal area after urination. This improved with a course of doxycycline. These symptoms have returned. He denies burning with urination.  Objective:  Vital signs in last 24 hours:  Blood pressure 143/92, pulse 89, temperature 98.5 F (36.9 C), temperature source Oral, resp. rate 18, height 6\' 1"  (1.854 m), weight 217 lb 9.6 oz (98.703 kg), SpO2 100 %.  Resp:  Lungs clear bilaterally Cardio:  Regular rate and rhythm GI:  No hepatomegaly, nontender, no mass Vascular:  No leg edema   Lab Results:   chromogranin A on 08/12/2015-127 (194 on 05/17/2015)  Medications: I have reviewed the patient's current medications.  Assessment/Plan: 1. 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  1. Status post a right colon/ileum resection on 09/29/2011.  2. Wedge biopsy of a right liver lesion on 09/29/2011 confirmed metastatic well-differentiated neuroendocrine carcinoma.  3. CT of the abdomen 08/25/2011 consistent with multiple liver metastases.  4. Elevated preoperative chromogranin A level and a 24-hour urine 5-HIAA level. Stable on repeat 11/16/2011. 5. Restaging CT on 12/28/2011 confirmed slight progression of the metastatic liver lesions. 6. Status post 3 cycles of Xeloda/temozolomide with cycle 3 initiated on 03/12/2012 7. Restaging CT of the abdomen 04/04/2012 revealed slight progression of the metastatic liver lesions and transverse mesocolon adenopathy. 8. Status post Y-90 radioembolization of the right liver on 05/19/2012. 9. Status post Y-90 radioembolization of the left liver on 07/06/2012. 10. Chromogranin A  level decreased on 08/29/2012, 24-hour urine 5 HIAA slightly increased on 09/05/2012 11. Restaging CT on 12/05/2012 with a mixed response in the liver 12. Restaging CT 08/11/2013-mild increase in the size of liver lesions. 13. Restaging CT 01/22/2014-stable to slightly improved liver lesions, mild increase in mesenteric adenopathy. Chromogranin A level stable. 14. Chromogranin A level stable 05/01/2014. 15. Chromogranin A higher on 08/02/2014 16. Restaging CT 11/06/2014 with interval enlargement of the enhancing lesions within the left and right hepatic lobe. 17. Initiation of monthly Sandostatin 12/24/2014 18. Status post Y-90 radioembolization of the right liver on 01/03/2015 19. Chromogranin A level stable 02/18/2015 20. Chromogranin A level improved 08/12/2015 21. Restaging CT 09/11/2015 with mild increase in the size of the dominant liver metastasis in the right hepatic lobe. Other smaller liver metastases showed no significant change. Stable mild lymphadenopathy in central small bowel mesentery and right cardiophrenic angle. Stable subcm right middle lobe pulmonary nodule. 22.  Monthly Sandostatin continued 2. History of diarrhea (3 to 4 times per day), predating surgery for 8 months. Decrease in daily bowel movements coinciding with discontinuation of Nexium. 3. Hypertension. 4. History of gastroesophageal reflux disease. No reflux symptoms since discontinuing Nexium. 5. Rectal and ascending colon polyps. He underwent a colonoscopy on 12/20/2012 with findings of a sessile polyp in the ascending colon and a sessile polyp in the rectum. Pathology showed hyperplastic polyps. Prior right colon resection noted with a normal appearing ileocolonic anastomosis. Small internal hemorrhoids noted. Repeat colonoscopy in 5 years. 6. History of Iron deficiency anemia   Disposition:   Tommy Conley appears stable. He will continue monthly Sandostatin. He has a history of significant liver metastases. I  will refer him to Dr. Leamon Arnt to see if he is eligible to receive  Lu-177 DOTATATE.   We will follow-up on the chromogranin A level from today. Tommy Conley will return for an office visit in 3 months. He will schedule an appointment with Dr. Wolfgang Phoenix to evaluate the rectal/ urinary symptoms.  Betsy Coder, MD  12/17/2015  10:00 AM

## 2015-12-17 NOTE — Telephone Encounter (Signed)
Gave patient avs report and appointments for January thru March.  °

## 2015-12-17 NOTE — Telephone Encounter (Signed)
Left message for np coord @ Dr. Leamon Arnt to return call [phone]

## 2015-12-20 LAB — CHROMOGRANIN A: Chromogranin A: 195 ng/mL — ABNORMAL HIGH (ref <=15)

## 2015-12-22 HISTORY — PX: COLON SURGERY: SHX602

## 2015-12-24 ENCOUNTER — Telehealth: Payer: Self-pay | Admitting: Family Medicine

## 2015-12-24 MED ORDER — DOXYCYCLINE HYCLATE 100 MG PO TABS: 100.0000 mg | ORAL_TABLET | Freq: Two times a day (BID) | ORAL | Status: DC

## 2015-12-24 NOTE — Telephone Encounter (Signed)
Pt called stating that he was seen in September for a perirectal abscess. Pt states that the medicine he was given worked for a while but now it is back. Pt wants to know if the same meds can be called in or if he needs to be seen.    [facility]

## 2015-12-24 NOTE — Telephone Encounter (Signed)
Rx sent electronically to pharmacy. Patient notified. 

## 2015-12-24 NOTE — Telephone Encounter (Signed)
May repeat asame abx

## 2015-12-25 ENCOUNTER — Telehealth: Payer: Self-pay | Admitting: *Deleted

## 2015-12-25 NOTE — Telephone Encounter (Signed)
-----   Message from Ladell Pier, MD sent at 12/24/2015  7:46 PM EST ----- Please call patient chromogranin A is slightly higher, continue sandostatin, f/u as scheduled, see Dr. Leamon Arnt as scheduled

## 2015-12-25 NOTE — Telephone Encounter (Signed)
Called pt with Chromagranin A result. Slightly higher, per Dr. Benay Spice. Continue Sandostatin, follow up as scheduled. Pt is scheduled to see Dr. Leamon Arnt 1/24. Order to schedulers to move injection appt to following day.

## 2016-01-14 ENCOUNTER — Ambulatory Visit: Payer: Commercial Managed Care - HMO

## 2016-01-15 ENCOUNTER — Telehealth: Payer: Self-pay | Admitting: *Deleted

## 2016-01-15 ENCOUNTER — Ambulatory Visit: Payer: Commercial Managed Care - HMO

## 2016-01-15 NOTE — Telephone Encounter (Signed)
Called patient about missed appointment  States that he has the flu, hasn't been out of the bed all day.  He will call back and reschedule when he feels better.

## 2016-02-11 ENCOUNTER — Ambulatory Visit (HOSPITAL_BASED_OUTPATIENT_CLINIC_OR_DEPARTMENT_OTHER): Payer: Commercial Managed Care - HMO

## 2016-02-11 VITALS — BP 149/93 | HR 103 | Temp 98.5°F

## 2016-02-11 DIAGNOSIS — C7A021 Malignant carcinoid tumor of the cecum: Secondary | ICD-10-CM | POA: Diagnosis not present

## 2016-02-11 DIAGNOSIS — C7B02 Secondary carcinoid tumors of liver: Secondary | ICD-10-CM | POA: Diagnosis not present

## 2016-02-11 MED ORDER — OCTREOTIDE ACETATE 20 MG IM KIT
20.0000 mg | PACK | Freq: Once | INTRAMUSCULAR | Status: AC
  Administered 2016-02-11: 20 mg via INTRAMUSCULAR
  Filled 2016-02-11: qty 1

## 2016-02-14 ENCOUNTER — Other Ambulatory Visit: Payer: Self-pay | Admitting: Family Medicine

## 2016-02-16 ENCOUNTER — Other Ambulatory Visit: Payer: Self-pay | Admitting: Family Medicine

## 2016-03-10 ENCOUNTER — Telehealth: Payer: Self-pay | Admitting: Oncology

## 2016-03-10 ENCOUNTER — Ambulatory Visit (HOSPITAL_BASED_OUTPATIENT_CLINIC_OR_DEPARTMENT_OTHER): Payer: Commercial Managed Care - HMO | Admitting: Nurse Practitioner

## 2016-03-10 ENCOUNTER — Other Ambulatory Visit (HOSPITAL_BASED_OUTPATIENT_CLINIC_OR_DEPARTMENT_OTHER): Payer: Commercial Managed Care - HMO

## 2016-03-10 ENCOUNTER — Ambulatory Visit (HOSPITAL_BASED_OUTPATIENT_CLINIC_OR_DEPARTMENT_OTHER): Payer: Commercial Managed Care - HMO

## 2016-03-10 VITALS — BP 142/96 | HR 95 | Temp 98.2°F | Resp 18 | Ht 73.0 in | Wt 216.0 lb

## 2016-03-10 DIAGNOSIS — C7A021 Malignant carcinoid tumor of the cecum: Secondary | ICD-10-CM

## 2016-03-10 DIAGNOSIS — C7B02 Secondary carcinoid tumors of liver: Secondary | ICD-10-CM

## 2016-03-10 DIAGNOSIS — I10 Essential (primary) hypertension: Secondary | ICD-10-CM | POA: Diagnosis not present

## 2016-03-10 DIAGNOSIS — R011 Cardiac murmur, unspecified: Secondary | ICD-10-CM | POA: Diagnosis not present

## 2016-03-10 MED ORDER — OCTREOTIDE ACETATE 20 MG IM KIT
20.0000 mg | PACK | Freq: Once | INTRAMUSCULAR | Status: AC
  Administered 2016-03-10: 20 mg via INTRAMUSCULAR
  Filled 2016-03-10: qty 1

## 2016-03-10 NOTE — Telephone Encounter (Signed)
Gave and printed appt sched and avs for pt for April and June

## 2016-03-10 NOTE — Progress Notes (Signed)
[facility] OFFICE PROGRESS NOTE   Diagnosis:  Carcinoid tumor  INTERVAL HISTORY:   Tommy Conley returns as scheduled. He continues monthly Sandostatin. He feels well. No diarrhea. No flushing episodes. He denies abdominal pain. He has a good appetite and good energy level.  Objective:  Vital signs in last 24 hours:  Blood pressure 142/96, pulse 95, temperature 98.2 F (36.8 C), temperature source Oral, resp. rate 18, height 6\' 1"  (1.854 m), weight 216 lb (97.977 kg), SpO2 100 %.    HEENT: No thrush or ulcers. Resp: Lungs clear bilaterally. Cardio: Regular rate and rhythm. GI: Abdomen soft and nontender. No hepatomegaly. No mass. Vascular: No leg edema.   Lab Results:  Lab Results  Component Value Date   WBC 6.0 01/03/2015   HGB 13.3 01/03/2015   HCT 40.5 01/03/2015   MCV 83.0 01/03/2015   PLT 198 01/03/2015   NEUTROABS 3.4 01/03/2015    Imaging:  No results found.  Medications: I have reviewed the patient's current medications.  Assessment/Plan: 1. 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  1. Status post a right colon/ileum resection on 09/29/2011.  2. Wedge biopsy of a right liver lesion on 09/29/2011 confirmed metastatic well-differentiated neuroendocrine carcinoma.  3. CT of the abdomen 08/25/2011 consistent with multiple liver metastases.  4. Elevated preoperative chromogranin A level and a 24-hour urine 5-HIAA level. Stable on repeat 11/16/2011. 5. Restaging CT on 12/28/2011 confirmed slight progression of the metastatic liver lesions. 6. Status post 3 cycles of Xeloda/temozolomide with cycle 3 initiated on 03/12/2012 7. Restaging CT of the abdomen 04/04/2012 revealed slight progression of the metastatic liver lesions and transverse mesocolon adenopathy. 8. Status post Y-90 radioembolization of the right liver on 05/19/2012. 9. Status post Y-90  radioembolization of the left liver on 07/06/2012. 10. Chromogranin A level decreased on 08/29/2012, 24-hour urine 5 HIAA slightly increased on 09/05/2012 11. Restaging CT on 12/05/2012 with a mixed response in the liver 12. Restaging CT 08/11/2013-mild increase in the size of liver lesions. 13. Restaging CT 01/22/2014-stable to slightly improved liver lesions, mild increase in mesenteric adenopathy. Chromogranin A level stable. 14. Chromogranin A level stable 05/01/2014. 15. Chromogranin A higher on 08/02/2014 16. Restaging CT 11/06/2014 with interval enlargement of the enhancing lesions within the left and right hepatic lobe. 17. Initiation of monthly Sandostatin 12/24/2014 18. Status post Y-90 radioembolization of the right liver on 01/03/2015 19. Chromogranin A level stable 02/18/2015 20. Chromogranin A level improved 08/12/2015 21. Restaging CT 09/11/2015 with mild increase in the size of the dominant liver metastasis in the right hepatic lobe. Other smaller liver metastases showed no significant change. Stable mild lymphadenopathy in central small bowel mesentery and right cardiophrenic angle. Stable subcm right middle lobe pulmonary nodule. 22. Monthly Sandostatin continued 2. History of diarrhea (3 to 4 times per day), predating surgery for 8 months. Decrease in daily bowel movements coinciding with discontinuation of Nexium. 3. Hypertension. 4. History of gastroesophageal reflux disease. No reflux symptoms since discontinuing Nexium. 5. Rectal and ascending colon polyps. He underwent a colonoscopy on 12/20/2012 with findings of a sessile polyp in the ascending colon and a sessile polyp in the rectum. Pathology showed hyperplastic polyps. Prior right colon resection noted with a normal appearing ileocolonic anastomosis. Small internal hemorrhoids noted. Repeat colonoscopy in 5 years. 6. History of Iron deficiency anemia     Disposition: Tommy Conley appears stable. He will continue  monthly Sandostatin. We will follow-up on the  chromogranin A level from today. He will return for a follow-up visit in 3 months.  We are referring him for a 2-D echo due to a heart murmur, question valve disease related to carcinoid.  Plan reviewed with Dr. Benay Spice.  Ned Card ANP/GNP-BC   03/10/2016  12:35 PM

## 2016-03-11 ENCOUNTER — Telehealth: Payer: Self-pay | Admitting: Oncology

## 2016-03-11 NOTE — Telephone Encounter (Signed)
lvm for pt regarding to 3.24 echo at 11.Marland KitchenMarland KitchenMarland Kitchen

## 2016-03-13 ENCOUNTER — Encounter: Payer: Self-pay | Admitting: Family Medicine

## 2016-03-13 ENCOUNTER — Ambulatory Visit (INDEPENDENT_AMBULATORY_CARE_PROVIDER_SITE_OTHER): Payer: Commercial Managed Care - HMO | Admitting: Family Medicine

## 2016-03-13 ENCOUNTER — Ambulatory Visit (HOSPITAL_COMMUNITY)
Admission: RE | Admit: 2016-03-13 | Discharge: 2016-03-13 | Disposition: A | Discharge status: Home / Self Care | Payer: Commercial Managed Care - HMO | Source: Ambulatory Visit | Attending: Nurse Practitioner | Admitting: Nurse Practitioner

## 2016-03-13 VITALS — BP 130/84 | Temp 98.8°F | Ht 73.0 in | Wt 218.5 lb

## 2016-03-13 DIAGNOSIS — I10 Essential (primary) hypertension: Secondary | ICD-10-CM | POA: Insufficient documentation

## 2016-03-13 DIAGNOSIS — K6289 Other specified diseases of anus and rectum: Secondary | ICD-10-CM | POA: Diagnosis not present

## 2016-03-13 DIAGNOSIS — C7A021 Malignant carcinoid tumor of the cecum: Secondary | ICD-10-CM | POA: Diagnosis not present

## 2016-03-13 DIAGNOSIS — K611 Rectal abscess: Secondary | ICD-10-CM | POA: Diagnosis not present

## 2016-03-13 DIAGNOSIS — R011 Cardiac murmur, unspecified: Secondary | ICD-10-CM | POA: Diagnosis not present

## 2016-03-13 LAB — CHROMOGRANIN A: Chromogranin A: 81 nmol/L — ABNORMAL HIGH (ref 0–5)

## 2016-03-13 LAB — CHROMOGRANIN A (PARALLEL TESTING): Chromogranin A: 148 ng/mL — ABNORMAL HIGH (ref <=15)

## 2016-03-13 MED ORDER — DOXYCYCLINE HYCLATE 100 MG PO TABS: 100.0000 mg | ORAL_TABLET | Freq: Two times a day (BID) | ORAL | Status: DC

## 2016-03-13 MED ORDER — HYDROCORTISONE ACE-PRAMOXINE 1-1 % RE CREA: 1.0000 "application " | TOPICAL_CREAM | Freq: Three times a day (TID) | RECTAL | Status: DC

## 2016-03-13 NOTE — Progress Notes (Signed)
   Subjective:    Patient ID: Tommy Conley, male    DOB: May 29, 1951, 65 y.o.   MRN: [id number]  HPI Patient is here today because he believes he has a perirectal abscess again. Onset about 6 months ago. Patient states that he is unable to urinate unless he passes gas also.  Patient has no other concerns at this time.  Patient does relate history of perirectal abscess. Also relates at times it's difficult to pass urine unless he also passes gas but he states his urine flow is good he denies hematuria denies hematochezia PSAs been normal Review of Systems Denies sweats chills fevers    patient denies constipation rectalbleeding  Objective:   Physical Exam  Lungs clear heart regular abdomen soft no masses rectal exam has tight anal canal some tenderness around this but no obvious hemorrhoids no abscess noted Excoriated area around the rectum History of liver cancer     Assessment & Plan:  Hard to tell if this is a fissure versus a localized infection, doxycycline twice a day 10 days, ProctoCream HC apply when necessary, if ongoing troubles follow-u patient was told if not doing dramatically better over the next 2-3 weeks follow-up office visitp

## 2016-03-13 NOTE — Progress Notes (Signed)
  Echocardiogram 2D Echocardiogram has been performed.  Tresa Res 03/13/2016, 11:56 AM

## 2016-04-07 ENCOUNTER — Ambulatory Visit (HOSPITAL_BASED_OUTPATIENT_CLINIC_OR_DEPARTMENT_OTHER): Payer: Commercial Managed Care - HMO

## 2016-04-07 ENCOUNTER — Other Ambulatory Visit: Payer: Commercial Managed Care - HMO

## 2016-04-07 VITALS — BP 154/93 | HR 82 | Temp 98.4°F

## 2016-04-07 DIAGNOSIS — C7A021 Malignant carcinoid tumor of the cecum: Secondary | ICD-10-CM | POA: Diagnosis not present

## 2016-04-07 DIAGNOSIS — C7B02 Secondary carcinoid tumors of liver: Secondary | ICD-10-CM

## 2016-04-07 MED ORDER — OCTREOTIDE ACETATE 20 MG IM KIT
20.0000 mg | PACK | Freq: Once | INTRAMUSCULAR | Status: AC
  Administered 2016-04-07: 20 mg via INTRAMUSCULAR
  Filled 2016-04-07: qty 1

## 2016-04-22 ENCOUNTER — Telehealth: Payer: Self-pay | Admitting: *Deleted

## 2016-04-22 NOTE — Telephone Encounter (Signed)
RN from [facility] requesting call back regarding this pt.  # is [phone]

## 2016-04-29 ENCOUNTER — Encounter: Payer: Self-pay | Admitting: Family Medicine

## 2016-04-29 ENCOUNTER — Ambulatory Visit (INDEPENDENT_AMBULATORY_CARE_PROVIDER_SITE_OTHER): Payer: Medicare Other | Admitting: Family Medicine

## 2016-04-29 VITALS — BP 158/102 | Ht 73.0 in | Wt 219.0 lb

## 2016-04-29 DIAGNOSIS — K625 Hemorrhage of anus and rectum: Secondary | ICD-10-CM | POA: Diagnosis not present

## 2016-04-29 DIAGNOSIS — K6289 Other specified diseases of anus and rectum: Secondary | ICD-10-CM

## 2016-04-29 DIAGNOSIS — I10 Essential (primary) hypertension: Secondary | ICD-10-CM

## 2016-04-29 DIAGNOSIS — C7A021 Malignant carcinoid tumor of the cecum: Secondary | ICD-10-CM | POA: Diagnosis not present

## 2016-04-29 MED ORDER — HYOSCYAMINE SULFATE ER 0.375 MG PO TB12: 0.3750 mg | ORAL_TABLET | Freq: Two times a day (BID) | ORAL | Status: DC

## 2016-04-29 MED ORDER — HYDROCORTISONE ACE-PRAMOXINE 1-1 % RE CREA: 1.0000 "application " | TOPICAL_CREAM | Freq: Three times a day (TID) | RECTAL | Status: DC

## 2016-04-29 MED ORDER — POTASSIUM CHLORIDE CRYS ER 20 MEQ PO TBCR: 20.0000 meq | EXTENDED_RELEASE_TABLET | Freq: Every day | ORAL | Status: DC

## 2016-04-29 MED ORDER — LOSARTAN POTASSIUM 100 MG PO TABS: ORAL_TABLET | ORAL | Status: DC

## 2016-04-29 MED ORDER — HYDRALAZINE HCL 25 MG PO TABS: 25.0000 mg | ORAL_TABLET | Freq: Three times a day (TID) | ORAL | Status: DC

## 2016-04-29 MED ORDER — HYDROCHLOROTHIAZIDE 25 MG PO TABS: ORAL_TABLET | ORAL | Status: DC

## 2016-04-29 MED ORDER — AMLODIPINE BESYLATE 10 MG PO TABS: 10.0000 mg | ORAL_TABLET | Freq: Every day | ORAL | Status: DC

## 2016-04-29 NOTE — Progress Notes (Signed)
   Subjective:    Patient ID: Tommy Conley, male    DOB: January 07, 1951, 65 y.o.   MRN: [id number]  Hypertension This is a chronic problem. The current episode started more than 1 year ago. Compliance problems: does not exercise but works alot outside 2 -3 times a week, eats healthy    pt states bp has been up. Feels hot, eyes get red, ears feel hot. Takes tsp of vinegar and symptoms go away. Felt really bad, was flushed, came on suddenly, use of vinegar suddenly cured it. He has only had dysfunction spells in the last 6 months couple nature ones  Claims compliance with blood pressure medication. Watching salt intake. Fair amount of exercise concerned that his numbers often are substantially elevated.   Using proctocream rectal cream and area is some better but not clearing up. Patient sees occasional blood in stool very concerned about this with his history of cancer  Review of Systems No headache, no major weight loss or weight gain, no chest pain no back pain abdominal pain no change in bowel habits complete ROS otherwise negative     Objective:   Physical Exam 152/94 on repeat  Alert oriented anxious appearing  Lungs clear heart regular rhythm abdomen benign  Some small hemorrhoids slight irritation     Assessment & Plan:  Impression 1 hypertension suboptimal in control discussed #2 flushing spells may well be related to patient's carcinoid tumor discussed at length #3 rectal irritation. Review prior colonoscopy shows history of rectal polyp. This along with patient's symptoms and occasional blood in stool leads to recommendation for GI referral plan all the above recheck in 3 months diet exercise discussed

## 2016-05-05 ENCOUNTER — Other Ambulatory Visit (HOSPITAL_BASED_OUTPATIENT_CLINIC_OR_DEPARTMENT_OTHER): Payer: Medicare Other

## 2016-05-05 ENCOUNTER — Ambulatory Visit (INDEPENDENT_AMBULATORY_CARE_PROVIDER_SITE_OTHER): Payer: Medicare Other | Admitting: Gastroenterology

## 2016-05-05 ENCOUNTER — Encounter: Payer: Self-pay | Admitting: Gastroenterology

## 2016-05-05 ENCOUNTER — Ambulatory Visit (HOSPITAL_BASED_OUTPATIENT_CLINIC_OR_DEPARTMENT_OTHER): Payer: Medicare Other

## 2016-05-05 VITALS — BP 168/80 | HR 82 | Temp 98.1°F | Resp 20

## 2016-05-05 VITALS — BP 150/88 | HR 72 | Ht 73.0 in | Wt 222.2 lb

## 2016-05-05 DIAGNOSIS — C7A021 Malignant carcinoid tumor of the cecum: Secondary | ICD-10-CM

## 2016-05-05 DIAGNOSIS — K6289 Other specified diseases of anus and rectum: Secondary | ICD-10-CM

## 2016-05-05 MED ORDER — OCTREOTIDE ACETATE 20 MG IM KIT
20.0000 mg | PACK | Freq: Once | INTRAMUSCULAR | Status: AC
  Administered 2016-05-05: 20 mg via INTRAMUSCULAR
  Filled 2016-05-05: qty 1

## 2016-05-05 NOTE — Progress Notes (Signed)
05/05/2016 P[place]OL [place] [id number] 05/09/1951   HISTORY OF PRESENT ILLNESS:  This is a 65 year old male who has metastatic carcinoid tumor of the cecum with primary well-differentiated neuroendocrine carcinoma involving the terminal ileum for which he underwent resection in October 2012. Has metastatic disease to the liver. Is currently undergoing treatment with octreotide.  Last colonoscopy was in December 2013 by Dr. Fuller Plan at which time his found have a right colon resection with normal-appearing ileocolonic anastomosis, small internal hemorrhoids, and 2 sessile polyps that were removed and were hyperplastic polyps. Repeat colonoscopy was recommended in 5 years from that time.  He reports to our office today at the request of his PCP, Dr. Wolfgang Phoenix, for complaints of rectal/anal discomfort. He says that he has a history of a fissure and has also had issues with hemorrhoids as well. He was complaining of a burning pain in his anus for which he has been using ProctoCream 3 times a day and was also treated with a course of doxycycline by his PCP. His symptoms have now completely resolved and he is no longer even using the cream.  He denies any rectal bleeding except for two small drops of bright red blood on the toilet paper on one occasion.   Past Medical History  Diagnosis Date  . Hypertension   . Barrett's esophagus   . GERD (gastroesophageal reflux disease)   . Colon polyp   . Carcinoid tumor of cecum   . Perennial allergic rhinitis   . IBS (irritable bowel syndrome)   . Cancer [facility])     colon ca with liver lesions   Past Surgical History  Procedure Laterality Date  . Colonoscopy  07/2011  . Upper gastrointestinal endoscopy      hx barrett's esophagus  . Colectomy  09/29/11    lap - right with liver bx  . Wedge liver biopsy  09/29/11    right liver lesion  . Colon surgery      reports that he has been smoking Cigarettes.  He has a 4.25 pack-year smoking history. He has  never used smokeless tobacco. He reports that he does not drink alcohol or use illicit drugs. family history includes Heart attack in his father; Hyperlipidemia in his father; Hypertension in his father. There is no history of Colon polyps, Colon cancer, Stomach cancer, or Cancer. Allergies  Allergen Reactions  . Vasotec [Enalapril] Other (See Comments)    cough      Outpatient Encounter Prescriptions as of 05/05/2016  Medication Sig  . amLODipine (NORVASC) 10 MG tablet Take 1 tablet (10 mg total) by mouth daily.  . hydrALAZINE (APRESOLINE) 25 MG tablet Take 1 tablet (25 mg total) by mouth 3 (three) times daily.  . hydrochlorothiazide (HYDRODIURIL) 25 MG tablet TAKE 1/2 TABLET IN THE MORNING  . hyoscyamine (LEVBID) 0.375 MG 12 hr tablet Take 1 tablet (0.375 mg total) by mouth 2 (two) times daily.  [name] losartan (COZAAR) 100 MG tablet TAKE 1 TABLET (100 MG TOTAL) BY MOUTH AT BEDTIME.  [name] potassium chloride SA (KLOR-CON M20) 20 MEQ tablet Take 1 tablet (20 mEq total) by mouth daily.  . sildenafil (VIAGRA) 100 MG tablet Take 1 tablet (100 mg total) by mouth daily as needed for erectile dysfunction.  . pramoxine-hydrocortisone (PROCTOCREAM-HC) 1-1 % rectal cream Place 1 application rectally 3 (three) times daily. (Patient not taking: Reported on 05/05/2016)  . [EXPIRED] octreotide (SANDOSTATIN LAR) IM injection 20 mg    No facility-administered encounter medications on file as  of 05/05/2016.     REVIEW OF SYSTEMS  : All other systems reviewed and negative except where noted in the History of Present Illness.   PHYSICAL EXAM: BP 150/88 mmHg  Pulse 72  Ht 6\' 1"  (1.854 m)  Wt 222 lb 4 oz (100.812 kg)  BMI 29.33 kg/m2 General: Well developed black male in no acute distress Head: Normocephalic and atraumatic Eyes:  Sclerae anicteric, conjunctiva pink. Ears: Normal auditory acuity Lungs: Clear throughout to auscultation Heart: Regular rate and rhythm Abdomen: Soft, non-distended.  Normal bowel  sounds.  Non-tender.  Previous laparotomy scar noted. Musculoskeletal: Symmetrical with no gross deformities  Skin: No lesions on visible extremities Extremities: No edema  Neurological: Alert oriented x 4, grossly non-focal. Psychological:  Alert and cooperative. Normal mood and affect  ASSESSMENT AND PLAN: -Rectal pain:  Resolved with the use of procto-cream.  Has history of hemorrhoids and fissure.  Will continue to use this at least at bedtime for a couple of weeks. -Metastatic carcinoid of the cecum with mets to the liver:  Last colonoscopy December 2013 with repeat recommended in December 2018.  CC:  Tommy Kirschner, MD

## 2016-05-05 NOTE — Patient Instructions (Signed)

## 2016-05-05 NOTE — Patient Instructions (Addendum)
Continue the Pramoxine at least once a day for a couple of weeks.

## 2016-05-05 NOTE — Progress Notes (Signed)
Reviewed and agree with initial management plan.  Luticia Tadros T. Nyleah Mcginnis, MD FACG 

## 2016-05-07 LAB — CHROMOGRANIN A: Chromogranin A: 76 nmol/L — ABNORMAL HIGH (ref 0–5)

## 2016-05-08 LAB — CHROMOGRANIN A (PARALLEL TESTING): CHROMOGRANIN A: 136 ng/mL — AB (ref <=15)

## 2016-05-11 ENCOUNTER — Telehealth: Payer: Self-pay | Admitting: *Deleted

## 2016-05-11 NOTE — Telephone Encounter (Signed)
Lab results discussed with pt.  Pt verbalized understanding.  Denies any questions at this time.

## 2016-05-21 ENCOUNTER — Telehealth: Payer: Self-pay | Admitting: Family Medicine

## 2016-05-21 NOTE — Telephone Encounter (Signed)
Received a phone call from Pharmacy stating that the Tommy Conley is not covered by insurance. What would you like to change medication to. Please advise?

## 2016-05-22 MED ORDER — DICYCLOMINE HCL 20 MG PO TABS: ORAL_TABLET | ORAL | Status: DC

## 2016-05-22 NOTE — Telephone Encounter (Signed)
Can try bentyl 20 mg one tid prn cramps numb 60 two ref, o v if insur does not cover that

## 2016-05-22 NOTE — Telephone Encounter (Signed)
Bentyl 20 mg 1 three times a day as needed for cramps was sent into pharmacy.

## 2016-05-22 NOTE — Addendum Note (Signed)
Addended by: Launa Grill on: 05/22/2016 08:25 AM   Modules accepted: Orders

## 2016-06-02 ENCOUNTER — Ambulatory Visit (HOSPITAL_BASED_OUTPATIENT_CLINIC_OR_DEPARTMENT_OTHER): Payer: Medicare Other | Admitting: Oncology

## 2016-06-02 ENCOUNTER — Ambulatory Visit (HOSPITAL_BASED_OUTPATIENT_CLINIC_OR_DEPARTMENT_OTHER): Payer: Medicare Other

## 2016-06-02 ENCOUNTER — Other Ambulatory Visit: Payer: Commercial Managed Care - HMO

## 2016-06-02 VITALS — BP 130/96 | HR 99 | Temp 98.3°F | Resp 20 | Ht 73.0 in | Wt 218.3 lb

## 2016-06-02 DIAGNOSIS — C7B02 Secondary carcinoid tumors of liver: Secondary | ICD-10-CM

## 2016-06-02 DIAGNOSIS — I10 Essential (primary) hypertension: Secondary | ICD-10-CM

## 2016-06-02 DIAGNOSIS — C7A021 Malignant carcinoid tumor of the cecum: Secondary | ICD-10-CM

## 2016-06-02 DIAGNOSIS — C7A012 Malignant carcinoid tumor of the ileum: Secondary | ICD-10-CM

## 2016-06-02 MED ORDER — OCTREOTIDE ACETATE 20 MG IM KIT
20.0000 mg | PACK | Freq: Once | INTRAMUSCULAR | Status: AC
  Administered 2016-06-02: 20 mg via INTRAMUSCULAR
  Filled 2016-06-02: qty 1

## 2016-06-02 NOTE — Patient Instructions (Signed)

## 2016-06-02 NOTE — Progress Notes (Signed)
[facility] OFFICE PROGRESS NOTE   Diagnosis: Carcinoid tumor  INTERVAL HISTORY:   [name] returns as scheduled. He feels well. He has approximately 4 bowel movements per day. Mild intermittent upper abdominal pain. No diarrhea or flushing.  Objective:  Vital signs in last 24 hours:  Blood pressure 130/96, pulse 99, temperature 98.3 F (36.8 C), temperature source Oral, resp. rate 20, height 6\' 1"  (1.854 m), weight 218 lb 4.8 oz (99.02 kg), SpO2 100 %.    HEENT: Neck without mass Resp: Lungs clear bilaterally Cardio: Regular rate and rhythm GI: No hepatomegaly, nontender, no mass Vascular: No leg edema   Lab Results:  Lab Results  Component Value Date   WBC 6.0 01/03/2015   HGB 13.3 01/03/2015   HCT 40.5 01/03/2015   MCV 83.0 01/03/2015   PLT 198 01/03/2015   NEUTROABS 3.4 01/03/2015     Medications: I have reviewed the patient's current medications.  Assessment/Plan: 1. 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  1. Status post a right colon/ileum resection on 09/29/2011.  2. Wedge biopsy of a right liver lesion on 09/29/2011 confirmed metastatic well-differentiated neuroendocrine carcinoma.  3. CT of the abdomen 08/25/2011 consistent with multiple liver metastases.  4. Elevated preoperative chromogranin A level and a 24-hour urine 5-HIAA level. Stable on repeat 11/16/2011. 5. Restaging CT on 12/28/2011 confirmed slight progression of the metastatic liver lesions. 6. Status post 3 cycles of Xeloda/temozolomide with cycle 3 initiated on 03/12/2012 7. Restaging CT of the abdomen 04/04/2012 revealed slight progression of the metastatic liver lesions and transverse mesocolon adenopathy. 8. Status post Y-90 radioembolization of the right liver on 05/19/2012. 9. Status post Y-90 radioembolization of the left liver on 07/06/2012. 10. Chromogranin A level decreased on  08/29/2012, 24-hour urine 5 HIAA slightly increased on 09/05/2012 11. Restaging CT on 12/05/2012 with a mixed response in the liver 12. Restaging CT 08/11/2013-mild increase in the size of liver lesions. 13. Restaging CT 01/22/2014-stable to slightly improved liver lesions, mild increase in mesenteric adenopathy. Chromogranin A level stable. 14. Chromogranin A level stable 05/01/2014. 15. Chromogranin A higher on 08/02/2014 16. Restaging CT 11/06/2014 with interval enlargement of the enhancing lesions within the left and right hepatic lobe. 17. Initiation of monthly Sandostatin 12/24/2014 18. Status post Y-90 radioembolization of the right liver on 01/03/2015 19. Chromogranin A level stable 02/18/2015 20. Chromogranin A level improved 08/12/2015 21. Restaging CT 09/11/2015 with mild increase in the size of the dominant liver metastasis in the right hepatic lobe. Other smaller liver metastases showed no significant change. Stable mild lymphadenopathy in central small bowel mesentery and right cardiophrenic angle. Stable subcm right middle lobe pulmonary nodule. 22. Monthly Sandostatin continued 2. History of diarrhea (3 to 4 times per day), predating surgery for 8 months. Decrease in daily bowel movements coinciding with discontinuation of Nexium. 3. Hypertension. 4. History of gastroesophageal reflux disease. No reflux symptoms since discontinuing Nexium. 5. Rectal and ascending colon polyps. He underwent a colonoscopy on 12/20/2012 with findings of a sessile polyp in the ascending colon and a sessile polyp in the rectum. Pathology showed hyperplastic polyps. Prior right colon resection noted with a normal appearing ileocolonic anastomosis. Small internal hemorrhoids noted. Repeat colonoscopy in 5 years. 6. History of Iron deficiency anemia     Disposition:  Mr. Student appears stable. He will continue monthly Sandostatin. He will return for an office visit in 3 months. He is awaiting  approval of the Lu-177  for treatment of metastatic carcinoid tumors. The plan is to refer him for radio immunotherapy when this agent is FDA approved.    Betsy Coder, MD  06/02/2016  2:14 PM

## 2016-06-03 ENCOUNTER — Telehealth: Payer: Self-pay | Admitting: Nurse Practitioner

## 2016-06-03 NOTE — Telephone Encounter (Signed)
cld pt and left message of 7/11 appt @1 :15-*adv pt to get updated copy of avs@ that appt with remaining appts for inj and MD appt

## 2016-06-30 ENCOUNTER — Ambulatory Visit (HOSPITAL_BASED_OUTPATIENT_CLINIC_OR_DEPARTMENT_OTHER): Payer: Medicare Other

## 2016-06-30 VITALS — BP 150/90 | HR 88 | Temp 98.0°F | Resp 18

## 2016-06-30 DIAGNOSIS — C7B02 Secondary carcinoid tumors of liver: Secondary | ICD-10-CM | POA: Diagnosis not present

## 2016-06-30 DIAGNOSIS — C7A021 Malignant carcinoid tumor of the cecum: Secondary | ICD-10-CM

## 2016-06-30 DIAGNOSIS — C7A012 Malignant carcinoid tumor of the ileum: Secondary | ICD-10-CM

## 2016-06-30 MED ORDER — OCTREOTIDE ACETATE 20 MG IM KIT
20.0000 mg | PACK | Freq: Once | INTRAMUSCULAR | Status: AC
  Administered 2016-06-30: 20 mg via INTRAMUSCULAR
  Filled 2016-06-30: qty 1

## 2016-06-30 NOTE — Patient Instructions (Signed)

## 2016-07-24 ENCOUNTER — Encounter: Payer: Self-pay | Admitting: Family Medicine

## 2016-07-24 ENCOUNTER — Ambulatory Visit (INDEPENDENT_AMBULATORY_CARE_PROVIDER_SITE_OTHER): Payer: Medicare Other | Admitting: Family Medicine

## 2016-07-24 VITALS — BP 152/98 | Ht 73.0 in | Wt 218.0 lb

## 2016-07-24 DIAGNOSIS — I10 Essential (primary) hypertension: Secondary | ICD-10-CM

## 2016-07-24 DIAGNOSIS — K580 Irritable bowel syndrome with diarrhea: Secondary | ICD-10-CM

## 2016-07-24 DIAGNOSIS — C7A021 Malignant carcinoid tumor of the cecum: Secondary | ICD-10-CM | POA: Diagnosis not present

## 2016-07-24 DIAGNOSIS — I6523 Occlusion and stenosis of bilateral carotid arteries: Secondary | ICD-10-CM

## 2016-07-24 DIAGNOSIS — N5201 Erectile dysfunction due to arterial insufficiency: Secondary | ICD-10-CM | POA: Diagnosis not present

## 2016-07-24 MED ORDER — LOSARTAN POTASSIUM 100 MG PO TABS: ORAL_TABLET | ORAL | 5 refills | Status: DC

## 2016-07-24 MED ORDER — POTASSIUM CHLORIDE CRYS ER 20 MEQ PO TBCR: 20.0000 meq | EXTENDED_RELEASE_TABLET | Freq: Every day | ORAL | 5 refills | Status: DC

## 2016-07-24 MED ORDER — AMLODIPINE BESYLATE 10 MG PO TABS: 10.0000 mg | ORAL_TABLET | Freq: Every day | ORAL | 5 refills | Status: DC

## 2016-07-24 MED ORDER — TAMSULOSIN HCL 0.4 MG PO CAPS: 0.4000 mg | ORAL_CAPSULE | Freq: Every day | ORAL | 5 refills | Status: DC

## 2016-07-24 MED ORDER — HYDROCHLOROTHIAZIDE 25 MG PO TABS: ORAL_TABLET | ORAL | 5 refills | Status: DC

## 2016-07-24 MED ORDER — HYDRALAZINE HCL 25 MG PO TABS: 25.0000 mg | ORAL_TABLET | Freq: Three times a day (TID) | ORAL | 5 refills | Status: DC

## 2016-07-24 NOTE — Progress Notes (Signed)
   Subjective:    Patient ID: Tommy Conley, male    DOB: [birth year], 65 y.o.   MRN: [id number] Patient arrives office with numerous concerns Hypertension  This is a chronic problem. The current episode started more than 1 year ago. There are no compliance problems (eats healthy and exercises).    Requesting refill on viagra. States deftly helping. Would like more.  occas ruq pain transient, on rx for carcinoma of the liver, carcinoid in etiology  Notes trouble urinating. Difficulty with frequency excessive at times. Difficulty with hesitancy. Worsening over the past year no dysuria no fever no abdominal pain  Was receiving panoramic x-rays and found to have bilateral carotid calcification. No TIA symptoms  Review of Systems No headache, no major weight loss or weight gain, no chest pain no back pain abdominal pain no change in bowel habits complete ROS otherwise negative     Objective:   Physical Exam  Alert vital stable no acute distress blood pressure good on repeat H&T normal lungs clear heart rare rhythm carotids decent pulses no palpable bruits      Assessment & Plan:  Impression 1 hypertension decent control repeat continue same dose of meds to prostate hypertrophy with hesitancy worth a trial medication discussed #3 chronic calcification we'll press on and order ultrasounds to be on the safe side #4 erectile dysfunction with ongoing need for medications discussed plan medications refilled diet exercise discussed carotid ultrasound follow-up is scheduled in 6 months [facility]

## 2016-07-28 ENCOUNTER — Ambulatory Visit (HOSPITAL_BASED_OUTPATIENT_CLINIC_OR_DEPARTMENT_OTHER): Payer: Medicare Other

## 2016-07-28 ENCOUNTER — Ambulatory Visit: Payer: Medicare Other

## 2016-07-28 ENCOUNTER — Ambulatory Visit: Payer: Medicare Other | Admitting: Nurse Practitioner

## 2016-07-28 ENCOUNTER — Other Ambulatory Visit: Payer: Medicare Other

## 2016-07-28 VITALS — BP 155/80 | HR 77 | Temp 98.2°F | Resp 20

## 2016-07-28 DIAGNOSIS — C7A021 Malignant carcinoid tumor of the cecum: Secondary | ICD-10-CM

## 2016-07-28 DIAGNOSIS — C7A012 Malignant carcinoid tumor of the ileum: Secondary | ICD-10-CM

## 2016-07-28 DIAGNOSIS — C7B02 Secondary carcinoid tumors of liver: Secondary | ICD-10-CM

## 2016-07-28 MED ORDER — OCTREOTIDE ACETATE 20 MG IM KIT
20.0000 mg | PACK | Freq: Once | INTRAMUSCULAR | Status: AC
  Administered 2016-07-28: 20 mg via INTRAMUSCULAR
  Filled 2016-07-28: qty 1

## 2016-07-28 NOTE — Patient Instructions (Signed)

## 2016-08-12 ENCOUNTER — Encounter (HOSPITAL_COMMUNITY): Payer: Self-pay | Admitting: Emergency Medicine

## 2016-08-12 ENCOUNTER — Emergency Department (HOSPITAL_COMMUNITY)
Admission: EM | Admit: 2016-08-12 | Discharge: 2016-08-12 | Disposition: A | Discharge status: Home / Self Care | Payer: Medicare Other | Attending: Emergency Medicine | Admitting: Emergency Medicine

## 2016-08-12 DIAGNOSIS — F17210 Nicotine dependence, cigarettes, uncomplicated: Secondary | ICD-10-CM | POA: Diagnosis not present

## 2016-08-12 DIAGNOSIS — T673XXA Heat exhaustion, anhydrotic, initial encounter: Secondary | ICD-10-CM | POA: Diagnosis not present

## 2016-08-12 DIAGNOSIS — Z79899 Other long term (current) drug therapy: Secondary | ICD-10-CM | POA: Insufficient documentation

## 2016-08-12 DIAGNOSIS — T675XXA Heat exhaustion, unspecified, initial encounter: Secondary | ICD-10-CM | POA: Diagnosis not present

## 2016-08-12 DIAGNOSIS — Z85030 Personal history of malignant carcinoid tumor of large intestine: Secondary | ICD-10-CM | POA: Diagnosis not present

## 2016-08-12 DIAGNOSIS — R404 Transient alteration of awareness: Secondary | ICD-10-CM | POA: Diagnosis not present

## 2016-08-12 DIAGNOSIS — I10 Essential (primary) hypertension: Secondary | ICD-10-CM | POA: Diagnosis not present

## 2016-08-12 DIAGNOSIS — R531 Weakness: Secondary | ICD-10-CM | POA: Diagnosis not present

## 2016-08-12 DIAGNOSIS — R55 Syncope and collapse: Secondary | ICD-10-CM | POA: Diagnosis present

## 2016-08-12 LAB — CBC WITH DIFFERENTIAL/PLATELET
BASOS PCT: 0 %
Basophils Absolute: 0 10*3/uL (ref 0.0–0.1)
EOS ABS: 0 10*3/uL (ref 0.0–0.7)
Eosinophils Relative: 0 %
HEMATOCRIT: 38.4 % — AB (ref 39.0–52.0)
HEMOGLOBIN: 12.8 g/dL — AB (ref 13.0–17.0)
Lymphocytes Relative: 9 %
Lymphs Abs: 0.9 10*3/uL (ref 0.7–4.0)
MCH: 27.8 pg (ref 26.0–34.0)
MCHC: 33.3 g/dL (ref 30.0–36.0)
MCV: 83.5 fL (ref 78.0–100.0)
MONOS PCT: 13 %
Monocytes Absolute: 1.3 10*3/uL — ABNORMAL HIGH (ref 0.1–1.0)
NEUTROS ABS: 7.8 10*3/uL — AB (ref 1.7–7.7)
NEUTROS PCT: 78 %
PLATELETS: 175 10*3/uL (ref 150–400)
RBC: 4.6 MIL/uL (ref 4.22–5.81)
RDW: 14.7 % (ref 11.5–15.5)
WBC: 10 10*3/uL (ref 4.0–10.5)

## 2016-08-12 LAB — COMPREHENSIVE METABOLIC PANEL
ALK PHOS: 117 U/L (ref 38–126)
ALT: 19 U/L (ref 17–63)
ANION GAP: 5 (ref 5–15)
AST: 23 U/L (ref 15–41)
Albumin: 3.6 g/dL (ref 3.5–5.0)
BUN: 14 mg/dL (ref 6–20)
CALCIUM: 9.1 mg/dL (ref 8.9–10.3)
CHLORIDE: 108 mmol/L (ref 101–111)
CO2: 28 mmol/L (ref 22–32)
CREATININE: 1.08 mg/dL (ref 0.61–1.24)
Glucose, Bld: 94 mg/dL (ref 65–99)
Potassium: 3.2 mmol/L — ABNORMAL LOW (ref 3.5–5.1)
SODIUM: 141 mmol/L (ref 135–145)
Total Bilirubin: 1.1 mg/dL (ref 0.3–1.2)
Total Protein: 7.4 g/dL (ref 6.5–8.1)

## 2016-08-12 LAB — URINALYSIS, ROUTINE W REFLEX MICROSCOPIC
GLUCOSE, UA: NEGATIVE mg/dL
Hgb urine dipstick: NEGATIVE
KETONES UR: NEGATIVE mg/dL
NITRITE: NEGATIVE
PH: 6 (ref 5.0–8.0)
PROTEIN: 30 mg/dL — AB
Specific Gravity, Urine: 1.023 (ref 1.005–1.030)

## 2016-08-12 LAB — URINE MICROSCOPIC-ADD ON

## 2016-08-12 LAB — I-STAT TROPONIN, ED
TROPONIN I, POC: 0 ng/mL (ref 0.00–0.08)
Troponin i, poc: 0 ng/mL (ref 0.00–0.08)

## 2016-08-12 LAB — RAPID URINE DRUG SCREEN, HOSP PERFORMED
Amphetamines: NOT DETECTED
BARBITURATES: NOT DETECTED
Benzodiazepines: NOT DETECTED
Cocaine: NOT DETECTED
Opiates: NOT DETECTED
Tetrahydrocannabinol: NOT DETECTED

## 2016-08-12 MED ORDER — POTASSIUM CHLORIDE CRYS ER 20 MEQ PO TBCR
40.0000 meq | EXTENDED_RELEASE_TABLET | Freq: Once | ORAL | Status: AC
  Administered 2016-08-12: 40 meq via ORAL
  Filled 2016-08-12: qty 2

## 2016-08-12 MED ORDER — SODIUM CHLORIDE 0.9 % IV BOLUS (SEPSIS)
1000.0000 mL | Freq: Once | INTRAVENOUS | Status: AC
Start: 2016-08-12 — End: 2016-08-12
  Administered 2016-08-12: 1000 mL via INTRAVENOUS

## 2016-08-12 NOTE — Discharge Instructions (Signed)
You have been seen today for evaluation following a syncopal episode. We suspect that you were suffering from heat exhaustion and dehydration. Your imaging and lab tests showed no abnormalities other than some low potassium, which could be due to the dehydration. Follow up with PCP as soon as possible for reevaluation. Return to ED should symptoms recur. Be sure to stay well-hydrated to avoid dehydration. This is especially true when working out in the heat.

## 2016-08-12 NOTE — ED Provider Notes (Signed)
Sign out from [place], [place]  Syncopal episode after manual labor in heat; potassium replaced, other labs unremarkable; waiting on repeat troponin; if negative, discharge. Liver cx not being treated- Shawn emailed oncologist to make him aware patient was seen here.  Delta troponin negative. Discharge as planned. Patient feeling well at discharge. Patient without questions. Patient vitals stable throughout ED course and discharged in satisfactory condition.   Frederica Kuster, PA-C 08/12/16 1807    Daleen Bo, MD 08/13/16 [phone]

## 2016-08-12 NOTE — ED Provider Notes (Signed)
[facility] DEPT Provider Note   CSN: [id number] Arrival date & time: 08/12/16  1201     History   Chief Complaint Chief Complaint  Patient presents with  . Heat Exposure    HPI Tommy Conley is a 65 y.o. male.  HPI   Tommy Conley is a 65 y.o. male, with a history of Liver cancer, presenting to the ED with syncope. Was at the [place] driving a stake into the ground to mark a future grave. Does not do this often. Got lightheaded, nauseous, and broke out in cold sweat. Pt then lost consciousness. Pt was sitting in his truck at the time. Did not fall. Bystanders state patient was unconscious for about 10 minutes. Feels much better following IV fluids. Denies CP, SOB, vomiting, head injury, or any other complaints.   Pt has active liver cancer. Treated by Dr. Benay Spice. Not receiving chemotherapy or radiation.   Past Medical History:  Diagnosis Date  . Barrett's esophagus   . Cancer ([place])    colon ca with liver lesions  . Carcinoid tumor of cecum   . Colon polyp   . GERD (gastroesophageal reflux disease)   . Hypertension   . IBS (irritable bowel syndrome)   . Perennial allergic rhinitis     Patient Active Problem List   Diagnosis Date Noted  . Rectal pain 05/05/2016  . Irritable bowel syndrome 04/29/2015  . Essential hypertension, benign 04/24/2013  . Erectile dysfunction 04/24/2013  . Esophageal reflux 04/24/2013  . Malignant carcinoid tumor of cecum ([place]) 10/13/2011    Past Surgical History:  Procedure Laterality Date  . colectomy  09/29/11   lap - right with liver bx  . COLON SURGERY    . COLONOSCOPY  07/2011  . UPPER GASTROINTESTINAL ENDOSCOPY     hx barrett's esophagus  . WEDGE LIVER BIOPSY  09/29/11   right liver lesion       Home Medications    Prior to Admission medications   Medication Sig Start Date End Date Taking? Authorizing Provider  amLODipine (NORVASC) 10 MG tablet Take 1 tablet (10 mg total) by mouth daily. 07/24/16  Yes Mikey Kirschner, MD  dicyclomine (BENTYL) 20 MG tablet Take 1 tablet by mouth 3 times as needed for abdominal cramps. Patient taking differently: Take 20 mg by mouth 3 (three) times daily as needed for spasms.  05/22/16  Yes Mikey Kirschner, MD  hydrALAZINE (APRESOLINE) 25 MG tablet Take 1 tablet (25 mg total) by mouth 3 (three) times daily. 07/24/16  Yes Mikey Kirschner, MD  hydrochlorothiazide (HYDRODIURIL) 25 MG tablet TAKE 1/2 TABLET IN THE MORNING Patient taking differently: Take 12.5 mg by mouth daily.  07/24/16  Yes Mikey Kirschner, MD  hyoscyamine (LEVBID) 0.375 MG 12 hr tablet Take 1 tablet (0.375 mg total) by mouth 2 (two) times daily. 04/29/16  Yes Mikey Kirschner, MD  losartan (COZAAR) 100 MG tablet TAKE 1 TABLET (100 MG TOTAL) BY MOUTH AT BEDTIME. 07/24/16  Yes Mikey Kirschner, MD  potassium chloride SA (KLOR-CON M20) 20 MEQ tablet Take 1 tablet (20 mEq total) by mouth daily. Patient taking differently: Take 20 mEq by mouth at bedtime.  07/24/16  Yes Mikey Kirschner, MD  sildenafil (VIAGRA) 100 MG tablet Take 1 tablet (100 mg total) by mouth daily as needed for erectile dysfunction. 09/19/14  Yes Mikey Kirschner, MD  tamsulosin (FLOMAX) 0.4 MG CAPS capsule Take 1 capsule (0.4 mg total) by mouth at bedtime. 07/24/16  Yes Mikey Kirschner, MD  pramoxine-hydrocortisone (PROCTOCREAM-HC) 1-1 % rectal cream Place 1 application rectally 3 (three) times daily. Patient not taking: Reported on 08/12/2016 04/29/16   Mikey Kirschner, MD    Family History Family History  Problem Relation Age of Onset  . Hypertension Father   . Hyperlipidemia Father   . Heart attack Father   . Colon polyps Neg Hx   . Colon cancer Neg Hx   . Stomach cancer Neg Hx   . Cancer Neg Hx     Social History Social History  Substance Use Topics  . Smoking status: Light Tobacco Smoker    Packs/day: 0.25    Years: 17.00    Types: Cigarettes  . Smokeless tobacco: Never Used     Comment: does not smoke every day  . Alcohol  use No     Allergies   Vasotec [enalapril]   Review of Systems Review of Systems  Constitutional: Negative for chills and fever.  Respiratory: Negative for shortness of breath.   Cardiovascular: Negative for chest pain.  Gastrointestinal: Positive for nausea (resolved). Negative for vomiting.  Neurological: Positive for syncope and light-headedness (resolved). Negative for weakness, numbness and headaches.  All other systems reviewed and are negative.    Physical Exam Updated Vital Signs BP 112/83 (BP Location: Left Arm)   Pulse 85   Temp 98.2 F (36.8 C) (Oral)   Resp 12   SpO2 95%   Physical Exam  Constitutional: He is oriented to person, place, and time. He appears well-developed and well-nourished. No distress.  HENT:  Head: Normocephalic and atraumatic.  Eyes: Conjunctivae and EOM are normal. Pupils are equal, round, and reactive to light.  Neck: Neck supple.  Cardiovascular: Normal rate, regular rhythm, normal heart sounds and intact distal pulses.   Pulmonary/Chest: Effort normal and breath sounds normal. No respiratory distress.  Abdominal: Soft. There is no tenderness. There is no guarding.  Musculoskeletal: Normal range of motion. He exhibits no edema or tenderness.  Full ROM in all extremities and spine. No midline spinal tenderness.   Lymphadenopathy:    He has no cervical adenopathy.  Neurological: He is alert and oriented to person, place, and time. He has normal reflexes.  No sensory deficits. Strength 5/5 in all extremities. No gait disturbance. Coordination intact. Cranial nerves III-XII grossly intact. No facial droop.   Skin: Skin is warm and dry. He is not diaphoretic.  Psychiatric: He has a normal mood and affect. His behavior is normal.  Nursing note and vitals reviewed.    ED Treatments / Results  Labs (all labs ordered are listed, but only abnormal results are displayed) Labs Reviewed  COMPREHENSIVE METABOLIC PANEL - Abnormal; Notable for  the following:       Result Value   Potassium 3.2 (*)    All other components within normal limits  CBC WITH DIFFERENTIAL/PLATELET - Abnormal; Notable for the following:    Hemoglobin 12.8 (*)    HCT 38.4 (*)    Neutro Abs 7.8 (*)    Monocytes Absolute 1.3 (*)    All other components within normal limits  URINALYSIS, ROUTINE W REFLEX MICROSCOPIC (NOT AT [facility]) - Abnormal; Notable for the following:    Color, Urine AMBER (*)    APPearance CLOUDY (*)    Bilirubin Urine MODERATE (*)    Protein, ur 30 (*)    Leukocytes, UA TRACE (*)    All other components within normal limits  URINE MICROSCOPIC-ADD ON - Abnormal; Notable for  the following:    Squamous Epithelial / LPF 0-5 (*)    Bacteria, UA FEW (*)    Casts HYALINE CASTS (*)    All other components within normal limits  URINE RAPID DRUG SCREEN, HOSP PERFORMED  I-STAT TROPOININ, ED  I-STAT TROPOININ, ED    EKG  EKG Interpretation  Date/Time:  Wednesday August 12 2016 12:44:27 EDT Ventricular Rate:  85 PR Interval:    QRS Duration: 91 QT Interval:  359 QTC Calculation: 427 R Axis:   77 Text Interpretation:  Sinus rhythm No significant change since last tracing Confirmed by Alvino Chapel  MD, Ovid Curd [phone]) on 08/12/2016 1:38:00 PM       Radiology No results found.  Procedures Procedures (including critical care time)  Orthostatic VS for the past 24 hrs:  BP- Lying Pulse- Lying BP- Sitting Pulse- Sitting BP- Standing at 0 minutes Pulse- Standing at 0 minutes  08/12/16 1419 (!) 122/94 91 (!) 134/96 94 (!) 128/96 98      Medications Ordered in ED Medications  sodium chloride 0.9 % bolus 1,000 mL (1,000 mLs Intravenous New Bag/Given 08/12/16 1348)  potassium chloride SA (K-DUR,KLOR-CON) CR tablet 40 mEq (40 mEq Oral Given 08/12/16 1418)     Initial Impression / Assessment and Plan / ED Course  I have reviewed the triage vital signs and the nursing notes.  Pertinent labs & imaging results that were available during my  care of the patient were reviewed by me and considered in my medical decision making (see chart for details).  Clinical Course    Tommy Conley presents for evaluation following a syncopal episode today.  Findings and plan of care discussed with Davonna Belling, MD. Dr. Alvino Chapel personally evaluated and examined this patient.  Patient's presentation and story are consistent with heat exhaustion and dehydration. Upon my evaluation, patient denies any complaints. States he feels well. Patient is nontoxic appearing, afebrile, not tachycardic, not tachypneic, not hypotensive, maintains adequate SPO2 on room air, and is in no apparent distress. Patient remained stable throughout his time in the ED. Initial troponin negative. End of shift patient care handoff report given to Eliezer Mccoy, PA-C. Plan: Await second troponin. Assure patient ambulates without difficulty and without assistance. Discharge with PCP follow-up.   Vitals:   08/12/16 1221 08/12/16 1422  BP: 112/83 134/96  Pulse: 85 91  Resp: 12 23  Temp: 98.2 F (36.8 C)   TempSrc: Oral   SpO2: 95% 96%     Final Clinical Impressions(s) / ED Diagnoses   Final diagnoses:  Heat exhaustion, initial encounter    New Prescriptions New Prescriptions   No medications on file     Layla Maw 08/12/16 [place], MD 08/15/16 1745

## 2016-08-12 NOTE — ED Notes (Signed)
Pt ambulated in hallway under NAD.  Denies any dizziness or nausea. Pt O2 remained between 97-100%, pt pulse 75-88.

## 2016-08-12 NOTE — ED Triage Notes (Signed)
Pt came via [employer]. Pt was at a graveyard marking a grave starting at around 10:30 this morning when he felt lightheaded and sweaty. Pt returned to truck to get water when bystanders noticed him and called 911. On EMS arrival pt was hypotension, in NSR. EMS reported slight depression in lead II once on the truck but other than that unremarkable. Pt received 600 cc of NS on EMS and is finishing the rest of the bag now. Pt reports no pain, just weakness but states he is starting to feel better after the fluids. Pt has a hx of HTN. Pt states that he started a new medication 1 week ago for enlarged prostate( pt doesn't know name). Pt stated he noticed he felt this way earlier in the week after starting this medication but not nearly as bad. Pt states that today is the first day out in the sun with this medication.

## 2016-08-14 ENCOUNTER — Telehealth: Payer: Self-pay | Admitting: Family Medicine

## 2016-08-14 NOTE — Telephone Encounter (Signed)
Wants to know if he should continue taking the new Rx for his prostate. Dr. Richardson Landry prescribed it at last visit. Took Medication and had a syncopal episode. Please call pt with further instructions.

## 2016-08-25 ENCOUNTER — Other Ambulatory Visit: Payer: Self-pay | Admitting: Family Medicine

## 2016-08-25 ENCOUNTER — Ambulatory Visit (HOSPITAL_BASED_OUTPATIENT_CLINIC_OR_DEPARTMENT_OTHER): Payer: Medicare Other | Admitting: Nurse Practitioner

## 2016-08-25 ENCOUNTER — Ambulatory Visit: Payer: Medicare Other

## 2016-08-25 ENCOUNTER — Other Ambulatory Visit (HOSPITAL_BASED_OUTPATIENT_CLINIC_OR_DEPARTMENT_OTHER): Payer: Medicare Other

## 2016-08-25 ENCOUNTER — Encounter: Payer: Self-pay | Admitting: Nurse Practitioner

## 2016-08-25 VITALS — BP 147/84 | HR 100 | Temp 98.4°F | Resp 18 | Ht 73.0 in | Wt 215.2 lb

## 2016-08-25 DIAGNOSIS — E340 Carcinoid syndrome: Secondary | ICD-10-CM

## 2016-08-25 DIAGNOSIS — C7A021 Malignant carcinoid tumor of the cecum: Secondary | ICD-10-CM

## 2016-08-25 DIAGNOSIS — C7A098 Malignant carcinoid tumors of other sites: Secondary | ICD-10-CM

## 2016-08-25 DIAGNOSIS — D649 Anemia, unspecified: Secondary | ICD-10-CM | POA: Diagnosis not present

## 2016-08-25 DIAGNOSIS — C7A012 Malignant carcinoid tumor of the ileum: Secondary | ICD-10-CM

## 2016-08-25 DIAGNOSIS — C7A00 Malignant carcinoid tumor of unspecified site: Secondary | ICD-10-CM | POA: Diagnosis not present

## 2016-08-25 HISTORY — DX: Malignant carcinoid tumors of other sites: C7A.098

## 2016-08-25 LAB — COMPREHENSIVE METABOLIC PANEL
ALK PHOS: 161 U/L — AB (ref 40–150)
ALT: 14 U/L (ref 0–55)
AST: 22 U/L (ref 5–34)
Albumin: 3.3 g/dL — ABNORMAL LOW (ref 3.5–5.0)
Anion Gap: 11 mEq/L (ref 3–11)
BUN: 10 mg/dL (ref 7.0–26.0)
CHLORIDE: 103 meq/L (ref 98–109)
CO2: 26 meq/L (ref 22–29)
Calcium: 9.4 mg/dL (ref 8.4–10.4)
Creatinine: 1 mg/dL (ref 0.7–1.3)
GLUCOSE: 115 mg/dL (ref 70–140)
POTASSIUM: 3.5 meq/L (ref 3.5–5.1)
SODIUM: 140 meq/L (ref 136–145)
Total Bilirubin: 0.81 mg/dL (ref 0.20–1.20)
Total Protein: 7.3 g/dL (ref 6.4–8.3)

## 2016-08-25 MED ORDER — OCTREOTIDE ACETATE 20 MG IM KIT
20.0000 mg | PACK | Freq: Once | INTRAMUSCULAR | Status: AC
  Administered 2016-08-25: 20 mg via INTRAMUSCULAR
  Filled 2016-08-25: qty 1

## 2016-08-25 NOTE — Patient Instructions (Signed)

## 2016-08-25 NOTE — Progress Notes (Signed)
Injection given in MD office visit

## 2016-08-25 NOTE — Progress Notes (Signed)
Tommy Conley PROGRESS NOTE   Diagnosis:  Carcinoid tumor  INTERVAL HISTORY:   [name] returns as scheduled. He continues monthly Sandostatin. He was seen in the emergency department 08/12/2016 after experiencing a syncopal episode while working in the heat. He received IV fluids. He was discharged home the same day.  He has had no further sacral episodes. He feels well. No flushing. No diarrhea. No significant abdominal pain. He reports a good appetite.  Objective:  Vital signs in last 24 hours:  Blood pressure (!) 147/84, pulse 100, temperature 98.4 F (36.9 C), temperature source Oral, resp. rate 18, height 6\' 1"  (1.854 m), weight 215 lb 3.2 oz (97.6 kg), SpO2 100 %.    Lymphatics: No palpable cervical, supraclavicular or axillary lymph nodes. Resp: Lungs clear bilaterally. Cardio: Regular rate and rhythm. GI: Abdomen soft and nontender. No hepatomegaly. No mass. Vascular: No leg edema.   Lab Results:  Lab Results  Component Value Date   WBC 10.0 08/12/2016   HGB 12.8 (L) 08/12/2016   HCT 38.4 (L) 08/12/2016   MCV 83.5 08/12/2016   PLT 175 08/12/2016   NEUTROABS 7.8 (H) 08/12/2016    Imaging:  No results found.  Medications: I have reviewed the patient's current medications.  Assessment/Plan: 1. 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  1. Status post a right colon/ileum resection on 09/29/2011.  2. Wedge biopsy of a right liver lesion on 09/29/2011 confirmed metastatic well-differentiated neuroendocrine carcinoma.  3. CT of the abdomen 08/25/2011 consistent with multiple liver metastases.  4. Elevated preoperative chromogranin A level and a 24-hour urine 5-HIAA level. Stable on repeat 11/16/2011. 5. Restaging CT on 12/28/2011 confirmed slight progression of the metastatic liver lesions. 6. Status post 3 cycles of Xeloda/temozolomide with cycle 3  initiated on 03/12/2012 7. Restaging CT of the abdomen 04/04/2012 revealed slight progression of the metastatic liver lesions and transverse mesocolon adenopathy. 8. Status post Y-90 radioembolization of the right liver on 05/19/2012. 9. Status post Y-90 radioembolization of the left liver on 07/06/2012. 10. Chromogranin A level decreased on 08/29/2012, 24-hour urine 5 HIAA slightly increased on 09/05/2012 11. Restaging CT on 12/05/2012 with a mixed response in the liver 12. Restaging CT 08/11/2013-mild increase in the size of liver lesions. 13. Restaging CT 01/22/2014-stable to slightly improved liver lesions, mild increase in mesenteric adenopathy. Chromogranin A level stable. 14. Chromogranin A level stable 05/01/2014. 15. Chromogranin A higher on 08/02/2014 16. Restaging CT 11/06/2014 with interval enlargement of the enhancing lesions within the left and right hepatic lobe. 17. Initiation of monthly Sandostatin 12/24/2014 18. Status post Y-90 radioembolization of the right liver on 01/03/2015 19. Chromogranin A level stable 02/18/2015 20. Chromogranin A level improved 08/12/2015 21. Restaging CT 09/11/2015 with mild increase in the size of the dominant liver metastasis in the right hepatic lobe. Other smaller liver metastases showed no significant change. Stable mild lymphadenopathy in central small bowel mesentery and right cardiophrenic angle. Stable subcm right middle lobe pulmonary nodule. 22. Monthly Sandostatin continued 2. History of diarrhea (3 to 4 times per day), predating surgery for 8 months. Decrease in daily bowel movements coinciding with discontinuation of Nexium. 3. Hypertension. 4. History of gastroesophageal reflux disease. No reflux symptoms since discontinuing Nexium. 5. Rectal and ascending colon polyps. He underwent a colonoscopy on 12/20/2012 with findings of a sessile polyp in the ascending colon and a sessile polyp in the rectum. Pathology showed hyperplastic polyps.  Prior right colon  resection noted with a normal appearing ileocolonic anastomosis. Small internal hemorrhoids noted. Repeat colonoscopy in 5 years. 6. History of Iron deficiency anemia 7. 08/12/2016 syncopal episode status post evaluation in the emergency department   Disposition: Mr. Uhland appears stable. He will continue monthly Sandostatin. He will return for a follow-up visit in 3 months. He will contact the Conley prior to his next visit with any problems.  Plan reviewed with Dr. Benay Spice.   Ned Card ANP/GNP-BC   08/25/2016  12:35 PM

## 2016-08-28 LAB — CHROMOGRANIN A: CHROMOGRAN A: 65 nmol/L — AB (ref 0–5)

## 2016-09-15 ENCOUNTER — Other Ambulatory Visit: Payer: Self-pay | Admitting: Family Medicine

## 2016-09-17 ENCOUNTER — Telehealth: Payer: Self-pay | Admitting: Oncology

## 2016-09-17 NOTE — Telephone Encounter (Signed)
Spoke with patient re next appointment for 10/3

## 2016-09-22 ENCOUNTER — Ambulatory Visit (HOSPITAL_BASED_OUTPATIENT_CLINIC_OR_DEPARTMENT_OTHER): Payer: Medicare Other

## 2016-09-22 VITALS — BP 139/85 | HR 91 | Temp 97.8°F | Resp 18

## 2016-09-22 DIAGNOSIS — C7A021 Malignant carcinoid tumor of the cecum: Secondary | ICD-10-CM

## 2016-09-22 DIAGNOSIS — C7A012 Malignant carcinoid tumor of the ileum: Secondary | ICD-10-CM

## 2016-09-22 MED ORDER — OCTREOTIDE ACETATE 20 MG IM KIT
20.0000 mg | PACK | Freq: Once | INTRAMUSCULAR | Status: AC
  Administered 2016-09-22: 20 mg via INTRAMUSCULAR
  Filled 2016-09-22: qty 1

## 2016-09-22 NOTE — Patient Instructions (Signed)

## 2016-10-04 ENCOUNTER — Other Ambulatory Visit: Payer: Self-pay | Admitting: Family Medicine

## 2016-10-20 ENCOUNTER — Ambulatory Visit (HOSPITAL_BASED_OUTPATIENT_CLINIC_OR_DEPARTMENT_OTHER): Payer: Medicare Other

## 2016-10-20 VITALS — BP 129/96 | Temp 97.9°F

## 2016-10-20 DIAGNOSIS — C7A012 Malignant carcinoid tumor of the ileum: Secondary | ICD-10-CM

## 2016-10-20 DIAGNOSIS — C7A021 Malignant carcinoid tumor of the cecum: Secondary | ICD-10-CM

## 2016-10-20 MED ORDER — OCTREOTIDE ACETATE 20 MG IM KIT
20.0000 mg | PACK | Freq: Once | INTRAMUSCULAR | Status: AC
  Administered 2016-10-20: 20 mg via INTRAMUSCULAR
  Filled 2016-10-20: qty 1

## 2016-10-20 NOTE — Patient Instructions (Signed)

## 2016-10-24 ENCOUNTER — Other Ambulatory Visit: Payer: Self-pay | Admitting: Family Medicine

## 2016-11-15 ENCOUNTER — Other Ambulatory Visit: Payer: Self-pay | Admitting: Family Medicine

## 2016-11-17 ENCOUNTER — Ambulatory Visit (HOSPITAL_BASED_OUTPATIENT_CLINIC_OR_DEPARTMENT_OTHER): Payer: Medicare Other | Admitting: Oncology

## 2016-11-17 ENCOUNTER — Other Ambulatory Visit (HOSPITAL_COMMUNITY): Payer: Self-pay | Admitting: Diagnostic Radiology

## 2016-11-17 ENCOUNTER — Ambulatory Visit (HOSPITAL_BASED_OUTPATIENT_CLINIC_OR_DEPARTMENT_OTHER): Payer: Medicare Other

## 2016-11-17 ENCOUNTER — Other Ambulatory Visit: Payer: Medicare Other

## 2016-11-17 VITALS — BP 132/96 | HR 90 | Temp 98.6°F | Resp 18 | Ht 73.0 in | Wt 220.9 lb

## 2016-11-17 DIAGNOSIS — C7B02 Secondary carcinoid tumors of liver: Secondary | ICD-10-CM

## 2016-11-17 DIAGNOSIS — Z23 Encounter for immunization: Secondary | ICD-10-CM

## 2016-11-17 DIAGNOSIS — C7A021 Malignant carcinoid tumor of the cecum: Secondary | ICD-10-CM

## 2016-11-17 DIAGNOSIS — I6523 Occlusion and stenosis of bilateral carotid arteries: Secondary | ICD-10-CM

## 2016-11-17 DIAGNOSIS — I10 Essential (primary) hypertension: Secondary | ICD-10-CM | POA: Diagnosis not present

## 2016-11-17 DIAGNOSIS — E340 Carcinoid syndrome: Secondary | ICD-10-CM | POA: Diagnosis not present

## 2016-11-17 DIAGNOSIS — C7A098 Malignant carcinoid tumors of other sites: Secondary | ICD-10-CM | POA: Diagnosis not present

## 2016-11-17 DIAGNOSIS — C7A012 Malignant carcinoid tumor of the ileum: Secondary | ICD-10-CM

## 2016-11-17 MED ORDER — INFLUENZA VAC SPLIT QUAD 0.5 ML IM SUSY
0.5000 mL | PREFILLED_SYRINGE | Freq: Once | INTRAMUSCULAR | Status: AC
  Administered 2016-11-17: 0.5 mL via INTRAMUSCULAR
  Filled 2016-11-17: qty 0.5

## 2016-11-17 MED ORDER — OCTREOTIDE ACETATE 20 MG IM KIT
20.0000 mg | PACK | Freq: Once | INTRAMUSCULAR | Status: AC
  Administered 2016-11-17: 20 mg via INTRAMUSCULAR
  Filled 2016-11-17: qty 1

## 2016-11-17 NOTE — Patient Instructions (Signed)
Advance Directive Advance directives are the legal documents that allow you to make choices about your health care and medical treatment if you cannot speak for yourself. Advance directives are a way for you to communicate your wishes to family, friends, and health care providers. The specified people can then convey your decisions about end-of-life care to avoid confusion if you should become unable to communicate. Ideally, the process of discussing and writing advance directives should happen over time rather than making decisions all at once. Advance directives can be modified as your situation changes, and you can change your mind at any time, even after you have signed the advance directives. Each state has its own laws regarding advance directives. You may want to check with your health care provider, attorney, or state representative about the law in your state. Below are some examples of advance directives. HEALTH CARE PROXY AND DURABLE POWER OF ATTORNEY FOR HEALTH CARE A health care proxy is a person (agent) appointed to make medical decisions for you if you cannot. Generally, people choose someone they know well and trust to represent their preferences when they can no longer do so. You should be sure to ask this person for agreement to act as your agent. An agent may have to exercise judgment in the event of a medical decision for which your wishes are not known. A durable power of attorney for health care is a legal document that names your health care proxy. Depending on the laws in your state, after the document is written, it may also need to be:  Signed.  Notarized.  Dated.  Copied.  Witnessed.  Incorporated into your medical record. You may also want to appoint someone to manage your financial affairs if you cannot. This is called a durable power of attorney for finances. It is a separate legal document from the durable power of attorney for health care. You may choose the same  person or someone different from your health care proxy to act as your agent in financial matters. LIVING WILL A living will is a set of instructions documenting your wishes about medical care when you cannot care for yourself. It is used if you become:  Terminally ill.  Incapacitated.  Unable to communicate.  Unable to make decisions. Items to consider in your living will include:  The use or non-use of life-sustaining equipment, such as dialysis machines and breathing machines (ventilators).  A do not resuscitate (DNR) order, which is the instruction not to use cardiopulmonary resuscitation (CPR) if breathing or heartbeat stops.  Tube feeding.  Withholding of food and fluids.  Comfort (palliative) care when the goal becomes comfort rather than a cure.  Organ and tissue donation. A living will does not give instructions about distribution of your money and property if you should pass away. It is advisable to seek the expert advice of a lawyer in drawing up a will regarding your possessions. Decisions about taxes, beneficiaries, and asset distribution will be legally binding. This process can relieve your family and friends of any burdens surrounding disputes or questions that may come up about the allocation of your assets. DO NOT RESUSCITATE (DNR) A do not resuscitate (DNR) order is a request to not have CPR in the event that your heart stops beating or you stop breathing. Unless given other instructions, a health care provider will try to help any patient whose heart has stopped or who has stopped breathing.  This information is not intended to replace advice given to  you by your health care provider. Make sure you discuss any questions you have with your health care provider. Document Released: 03/15/2008 Document Revised: 03/30/2016 Document Reviewed: 04/26/2013 Elsevier Interactive Patient Education  2017 [employer]  

## 2016-11-17 NOTE — Progress Notes (Signed)
[facility] OFFICE PROGRESS NOTE   Diagnosis: Carcinoid tumor  INTERVAL HISTORY:   Tommy Conley returns as scheduled. He feels well. He has intermittent pain in the abdomen and back lasting for minutes. No consistent pain. No diarrhea or flushing. Good appetite. He reports improvement in urinary hesitancy when he started Flomax.  Objective:  Vital signs in last 24 hours:  Blood pressure (!) 132/96, pulse 90, temperature 98.6 F (37 C), temperature source Oral, resp. rate 18, height 6\' 1"  (1.854 m), weight 220 lb 14.4 oz (100.2 kg), SpO2 100 %.    HEENT: Neck without mass Lymphatics: No cervical or supra-clavicular nodes Resp: Lungs clear bilaterally Cardio: Regular rate and rhythm, no murmur GI: No hepatomegaly, no mass, nontender Vascular: No leg edema   Lab Results:  Chromogranin A pending  Medications: I have reviewed the patient's current medications.  Assessment/Plan: 1. 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  1. Status post a right colon/ileum resection on 09/29/2011.  2. Wedge biopsy of a right liver lesion on 09/29/2011 confirmed metastatic well-differentiated neuroendocrine carcinoma.  3. CT of the abdomen 08/25/2011 consistent with multiple liver metastases.  4. Elevated preoperative chromogranin A level and a 24-hour urine 5-HIAA level. Stable on repeat 11/16/2011. 5. Restaging CT on 12/28/2011 confirmed slight progression of the metastatic liver lesions. 6. Status post 3 cycles of Xeloda/temozolomide with cycle 3 initiated on 03/12/2012 7. Restaging CT of the abdomen 04/04/2012 revealed slight progression of the metastatic liver lesions and transverse mesocolon adenopathy. 8. Status post Y-90 radioembolization of the right liver on 05/19/2012. 9. Status post Y-90 radioembolization of the left liver on 07/06/2012. 10. Chromogranin A level decreased on  08/29/2012, 24-hour urine 5 HIAA slightly increased on 09/05/2012 11. Restaging CT on 12/05/2012 with a mixed response in the liver 12. Restaging CT 08/11/2013-mild increase in the size of liver lesions. 13. Restaging CT 01/22/2014-stable to slightly improved liver lesions, mild increase in mesenteric adenopathy. Chromogranin A level stable. 14. Chromogranin A level stable 05/01/2014. 15. Chromogranin A higher on 08/02/2014 16. Restaging CT 11/06/2014 with interval enlargement of the enhancing lesions within the left and right hepatic lobe. 17. Initiation of monthly Sandostatin 12/24/2014 18. Status post Y-90 radioembolization of the right liver on 01/03/2015 19. Chromogranin A level stable 02/18/2015 20. Chromogranin A level improved 08/12/2015 21. Restaging CT 09/11/2015 with mild increase in the size of the dominant liver metastasis in the right hepatic lobe. Other smaller liver metastases showed no significant change. Stable mild lymphadenopathy in central small bowel mesentery and right cardiophrenic angle. Stable subcm right middle lobe pulmonary nodule. 22. Monthly Sandostatin continued 2. History of diarrhea (3 to 4 times per day), predating surgery for 8 months. Decrease in daily bowel movements coinciding with discontinuation of Nexium. 3. Hypertension. 4. History of gastroesophageal reflux disease. No reflux symptoms since discontinuing Nexium. 5. Rectal and ascending colon polyps. He underwent a colonoscopy on 12/20/2012 with findings of a sessile polyp in the ascending colon and a sessile polyp in the rectum. Pathology showed hyperplastic polyps. Prior right colon resection noted with a normal appearing ileocolonic anastomosis. Small internal hemorrhoids noted. Repeat colonoscopy in 5 years. 6. History of Iron deficiency anemia 7. 08/12/2016 syncopal episode status post evaluation in the emergency department    Disposition:  [name] appears stable. He will continue monthly  Sandostatin. I will refer him for a DOTATATE scan in anticipation of Luthera approval in January. He would like to wait  until January for the scan. [name] will return for an office visit in 2 months. He will receive an influenza vaccine today.  Betsy Coder, MD  11/17/2016  12:49 PM

## 2016-11-17 NOTE — Patient Instructions (Signed)
Influenza Virus Vaccine (Flucelvax) What is this medicine? INFLUENZA VIRUS VACCINE (in floo EN zuh VAHY ruhs vak SEEN) helps to reduce the risk of getting influenza also known as the flu. The vaccine only helps protect you against some strains of the flu. COMMON BRAND NAME(S): FLUCELVAX What should I tell my health care provider before I take this medicine? They need to know if you have any of these conditions: -bleeding disorder like hemophilia -fever or infection -Guillain-Barre syndrome or other neurological problems -immune system problems -infection with the human immunodeficiency virus (HIV) or AIDS -low blood platelet counts -multiple sclerosis -an unusual or allergic reaction to influenza virus vaccine, other medicines, foods, dyes or preservatives -pregnant or trying to get pregnant -breast-feeding How should I use this medicine? This vaccine is for injection into a muscle. It is given by a health care professional. A copy of Vaccine Information Statements will be given before each vaccination. Read this sheet carefully each time. The sheet may change frequently. Talk to your pediatrician regarding the use of this medicine in children. Special care may be needed. Overdosage: If you think you've taken too much of this medicine contact a poison control center or emergency room at once. What if I miss a dose? This does not apply. What may interact with this medicine? -chemotherapy or radiation therapy -medicines that lower your immune system like etanercept, anakinra, infliximab, and adalimumab -medicines that treat or prevent blood clots like warfarin -phenytoin -steroid medicines like prednisone or cortisone -theophylline -vaccines What should I watch for while using this medicine? Report any side effects that do not go away within 3 days to your doctor or health care professional. Call your health care provider if any unusual symptoms occur within 6 weeks of receiving this  vaccine. You may still catch the flu, but the illness is not usually as bad. You cannot get the flu from the vaccine. The vaccine will not protect against colds or other illnesses that may cause fever. The vaccine is needed every year. What side effects may I notice from receiving this medicine? Side effects that you should report to your doctor or health care professional as soon as possible: -allergic reactions like skin rash, itching or hives, swelling of the face, lips, or tongue Side effects that usually do not require medical attention (Report these to your doctor or health care professional if they continue or are bothersome.): -fever -headache -muscle aches and pains -pain, tenderness, redness, or swelling at the injection site -tiredness Where should I keep my medicine? The vaccine will be given by a health care professional in a clinic, pharmacy, doctor's office, or other health care setting. You will not be given vaccine doses to store at home.  2017 Elsevier/Gold Standard (2011-11-18 14:06:47) Octreotide injection solution What is this medicine? OCTREOTIDE (ok TREE oh tide) is used to reduce blood levels of growth hormone in patients with a condition called acromegaly. This medicine also reduces flushing and watery diarrhea caused by certain types of cancer. This medicine may be used for other purposes; ask your health care provider or pharmacist if you have questions. COMMON BRAND NAME(S): Sandostatin, Sandostatin LAR What should I tell my health care provider before I take this medicine? They need to know if you have any of these conditions: -gallbladder disease -kidney disease -liver disease -an unusual or allergic reaction to octreotide, other medicines, foods, dyes, or preservatives -pregnant or trying to get pregnant -breast-feeding How should I use this medicine? This medicine is for injection  under the skin or into a vein (only in emergency situations). It is usually  given by a health care professional in a hospital or clinic setting. If you get this medicine at home, you will be taught how to prepare and give this medicine. Allow the injection solution to come to room temperature before use. Do not warm it artificially. Use exactly as directed. Take your medicine at regular intervals. Do not take your medicine more often than directed. It is important that you put your used needles and syringes in a special sharps container. Do not put them in a trash can. If you do not have a sharps container, call your pharmacist or healthcare provider to get one. Talk to your pediatrician regarding the use of this medicine in children. Special care may be needed. Overdosage: If you think you have taken too much of this medicine contact a poison control center or emergency room at once. NOTE: This medicine is only for you. Do not share this medicine with others. What if I miss a dose? If you miss a dose, take it as soon as you can. If it is almost time for your next dose, take only that dose. Do not take double or extra doses. What may interact with this medicine? Do not take this medicine with any of the following medications: -cisapride -droperidol -general anesthetics -grepafloxacin -perphenazine -thioridazine This medicine may also interact with the following medications: -bromocriptine -cyclosporine -diuretics -medicines for blood pressure, heart disease, irregular heart beat -medicines for diabetes, including insulin -quinidine This list may not describe all possible interactions. Give your health care provider a list of all the medicines, herbs, non-prescription drugs, or dietary supplements you use. Also tell them if you smoke, drink alcohol, or use illegal drugs. Some items may interact with your medicine. What should I watch for while using this medicine? Visit your doctor or health care professional for regular checks on your progress. To help reduce  irritation at the injection site, use a different site for each injection and make sure the solution is at room temperature before use. This medicine may cause increases or decreases in blood sugar. Signs of high blood sugar include frequent urination, unusual thirst, flushed or dry skin, difficulty breathing, drowsiness, stomach ache, nausea, vomiting or dry mouth. Signs of low blood sugar include chills, cool, pale skin or cold sweats, drowsiness, extreme hunger, fast heartbeat, headache, nausea, nervousness or anxiety, shakiness, trembling, unsteadiness, tiredness, or weakness. Contact your doctor or health care professional right away if you experience any of these symptoms. What side effects may I notice from receiving this medicine? Side effects that you should report to your doctor or health care professional as soon as possible: -allergic reactions like skin rash, itching or hives, swelling of the face, lips, or tongue -changes in blood sugar -changes in heart rate -severe stomach pain Side effects that usually do not require medical attention (report to your doctor or health care professional if they continue or are bothersome): -diarrhea or constipation -gas or stomach pain -nausea, vomiting -pain, redness, swelling and irritation at site where injected This list may not describe all possible side effects. Call your doctor for medical advice about side effects. You may report side effects to FDA at 1-800-FDA-1088. Where should I keep my medicine? Keep out of the reach of children. Store in a refrigerator between 2 and 8 degrees C (36 and 46 degrees F). Protect from light. Allow to come to room temperature naturally. Do not use artificial  heat. If protected from light, the injection may be stored at room temperature between 20 and 30 degrees C (70 and 86 degrees F) for 14 days. After the initial use, throw away any unused portion of a multiple dose vial after 14 days. Throw away unused  portions of the ampules after use. NOTE: This sheet is a summary. It may not cover all possible information. If you have questions about this medicine, talk to your doctor, pharmacist, or health care provider.  2015, Elsevier/Gold Standard. (2008-07-03 16:56:04)

## 2016-11-18 LAB — CHROMOGRANIN A: Chromogranin A: 48 nmol/L — ABNORMAL HIGH (ref 0–5)

## 2016-11-30 ENCOUNTER — Telehealth: Payer: Self-pay | Admitting: General Practice

## 2016-11-30 NOTE — Telephone Encounter (Signed)
Spoke w/pt confirmed appts for 12/15/2016 & Jan 2018.

## 2016-12-05 ENCOUNTER — Other Ambulatory Visit: Payer: Self-pay | Admitting: Family Medicine

## 2016-12-15 ENCOUNTER — Ambulatory Visit (HOSPITAL_BASED_OUTPATIENT_CLINIC_OR_DEPARTMENT_OTHER): Payer: Medicare Other

## 2016-12-15 VITALS — BP 150/85 | HR 86 | Temp 98.4°F | Resp 18

## 2016-12-15 DIAGNOSIS — C7A021 Malignant carcinoid tumor of the cecum: Secondary | ICD-10-CM

## 2016-12-15 DIAGNOSIS — C7B02 Secondary carcinoid tumors of liver: Secondary | ICD-10-CM | POA: Diagnosis not present

## 2016-12-15 MED ORDER — OCTREOTIDE ACETATE 20 MG IM KIT
20.0000 mg | PACK | Freq: Once | INTRAMUSCULAR | Status: AC
  Administered 2016-12-15: 20 mg via INTRAMUSCULAR
  Filled 2016-12-15: qty 1

## 2016-12-15 NOTE — Patient Instructions (Signed)
Octreotide injection solution °What is this medicine? °OCTREOTIDE (ok TREE oh tide) is used to reduce blood levels of growth hormone in patients with a condition called acromegaly. This medicine also reduces flushing and watery diarrhea caused by certain types of cancer. °This medicine may be used for other purposes; ask your health care provider or pharmacist if you have questions. °COMMON BRAND NAME(S): Sandostatin, Sandostatin LAR °What should I tell my health care provider before I take this medicine? °They need to know if you have any of these conditions: °-gallbladder disease °-kidney disease °-liver disease °-an unusual or allergic reaction to octreotide, other medicines, foods, dyes, or preservatives °-pregnant or trying to get pregnant °-breast-feeding °How should I use this medicine? °This medicine is for injection under the skin or into a vein (only in emergency situations). It is usually given by a health care professional in a hospital or clinic setting. °If you get this medicine at home, you will be taught how to prepare and give this medicine. Allow the injection solution to come to room temperature before use. Do not warm it artificially. Use exactly as directed. Take your medicine at regular intervals. Do not take your medicine more often than directed. °It is important that you put your used needles and syringes in a special sharps container. Do not put them in a trash can. If you do not have a sharps container, call your pharmacist or healthcare provider to get one. °Talk to your pediatrician regarding the use of this medicine in children. Special care may be needed. °Overdosage: If you think you have taken too much of this medicine contact a poison control center or emergency room at once. °NOTE: This medicine is only for you. Do not share this medicine with others. °What if I miss a dose? °If you miss a dose, take it as soon as you can. If it is almost time for your next dose, take only that  dose. Do not take double or extra doses. °What may interact with this medicine? °Do not take this medicine with any of the following medications: °-cisapride °-droperidol °-general anesthetics °-grepafloxacin °-perphenazine °-thioridazine °This medicine may also interact with the following medications: °-bromocriptine °-cyclosporine °-diuretics °-medicines for blood pressure, heart disease, irregular heart beat °-medicines for diabetes, including insulin °-quinidine °This list may not describe all possible interactions. Give your health care provider a list of all the medicines, herbs, non-prescription drugs, or dietary supplements you use. Also tell them if you smoke, drink alcohol, or use illegal drugs. Some items may interact with your medicine. °What should I watch for while using this medicine? °Visit your doctor or health care professional for regular checks on your progress. °To help reduce irritation at the injection site, use a different site for each injection and make sure the solution is at room temperature before use. °This medicine may cause increases or decreases in blood sugar. Signs of high blood sugar include frequent urination, unusual thirst, flushed or dry skin, difficulty breathing, drowsiness, stomach ache, nausea, vomiting or dry mouth. Signs of low blood sugar include chills, cool, pale skin or cold sweats, drowsiness, extreme hunger, fast heartbeat, headache, nausea, nervousness or anxiety, shakiness, trembling, unsteadiness, tiredness, or weakness. Contact your doctor or health care professional right away if you experience any of these symptoms. °What side effects may I notice from receiving this medicine? °Side effects that you should report to your doctor or health care professional as soon as possible: °-allergic reactions like skin rash, itching or hives, swelling   of the face, lips, or tongue °-changes in blood sugar °-changes in heart rate °-severe stomach pain °Side effects that  usually do not require medical attention (report to your doctor or health care professional if they continue or are bothersome): °-diarrhea or constipation °-gas or stomach pain °-nausea, vomiting °-pain, redness, swelling and irritation at site where injected °This list may not describe all possible side effects. Call your doctor for medical advice about side effects. You may report side effects to FDA at 1-800-FDA-1088. °Where should I keep my medicine? °Keep out of the reach of children. °Store in a refrigerator between 2 and 8 degrees C (36 and 46 degrees F). Protect from light. Allow to come to room temperature naturally. Do not use artificial heat. If protected from light, the injection may be stored at room temperature between 20 and 30 degrees C (70 and 86 degrees F) for 14 days. After the initial use, throw away any unused portion of a multiple dose vial after 14 days. Throw away unused portions of the ampules after use. °NOTE: This sheet is a summary. It may not cover all possible information. If you have questions about this medicine, talk to your doctor, pharmacist, or health care provider. °© 2017 Elsevier/Gold Standard (2008-07-03 16:56:04) ° °

## 2016-12-22 ENCOUNTER — Other Ambulatory Visit: Payer: Self-pay | Admitting: Family Medicine

## 2016-12-26 ENCOUNTER — Other Ambulatory Visit: Payer: Self-pay | Admitting: Family Medicine

## 2017-01-12 ENCOUNTER — Ambulatory Visit (HOSPITAL_COMMUNITY): Admission: RE | Admit: 2017-01-12 | Payer: Medicare Other | Source: Ambulatory Visit

## 2017-01-12 ENCOUNTER — Other Ambulatory Visit: Payer: Medicare Other

## 2017-01-12 ENCOUNTER — Ambulatory Visit: Payer: Medicare Other

## 2017-01-12 ENCOUNTER — Ambulatory Visit: Payer: Medicare Other | Admitting: Nurse Practitioner

## 2017-01-14 ENCOUNTER — Encounter (HOSPITAL_COMMUNITY)
Admission: RE | Admit: 2017-01-14 | Discharge: 2017-01-14 | Disposition: A | Discharge status: Home / Self Care | Payer: Medicare Other | Source: Ambulatory Visit | Attending: Oncology | Admitting: Oncology

## 2017-01-14 DIAGNOSIS — C7A021 Malignant carcinoid tumor of the cecum: Secondary | ICD-10-CM

## 2017-01-14 DIAGNOSIS — C787 Secondary malignant neoplasm of liver and intrahepatic bile duct: Secondary | ICD-10-CM | POA: Diagnosis not present

## 2017-01-14 DIAGNOSIS — Z23 Encounter for immunization: Secondary | ICD-10-CM | POA: Insufficient documentation

## 2017-01-16 ENCOUNTER — Other Ambulatory Visit: Payer: Self-pay | Admitting: Family Medicine

## 2017-01-19 ENCOUNTER — Telehealth: Payer: Self-pay | Admitting: Oncology

## 2017-01-19 NOTE — Telephone Encounter (Signed)
lvm to inform pt of 2/2 inj appt at 1130 am per LOS. Informed pt to get updated schedule at scheduling for upcoming appts

## 2017-01-22 ENCOUNTER — Telehealth: Payer: Self-pay | Admitting: Oncology

## 2017-01-22 ENCOUNTER — Ambulatory Visit (HOSPITAL_BASED_OUTPATIENT_CLINIC_OR_DEPARTMENT_OTHER): Payer: Medicare Other

## 2017-01-22 VITALS — BP 147/89 | HR 100 | Temp 97.5°F | Resp 18

## 2017-01-22 DIAGNOSIS — C7B02 Secondary carcinoid tumors of liver: Secondary | ICD-10-CM

## 2017-01-22 DIAGNOSIS — C7A021 Malignant carcinoid tumor of the cecum: Secondary | ICD-10-CM | POA: Diagnosis present

## 2017-01-22 MED ORDER — OCTREOTIDE ACETATE 20 MG IM KIT
20.0000 mg | PACK | Freq: Once | INTRAMUSCULAR | Status: AC
  Administered 2017-01-22: 20 mg via INTRAMUSCULAR
  Filled 2017-01-22: qty 1

## 2017-01-22 NOTE — Telephone Encounter (Signed)
Patient was given a copy of the appointment schedule. °

## 2017-01-22 NOTE — Patient Instructions (Signed)
Octreotide injection solution °What is this medicine? °OCTREOTIDE (ok TREE oh tide) is used to reduce blood levels of growth hormone in patients with a condition called acromegaly. This medicine also reduces flushing and watery diarrhea caused by certain types of cancer. °This medicine may be used for other purposes; ask your health care provider or pharmacist if you have questions. °COMMON BRAND NAME(S): Sandostatin, Sandostatin LAR °What should I tell my health care provider before I take this medicine? °They need to know if you have any of these conditions: °-gallbladder disease °-kidney disease °-liver disease °-an unusual or allergic reaction to octreotide, other medicines, foods, dyes, or preservatives °-pregnant or trying to get pregnant °-breast-feeding °How should I use this medicine? °This medicine is for injection under the skin or into a vein (only in emergency situations). It is usually given by a health care professional in a hospital or clinic setting. °If you get this medicine at home, you will be taught how to prepare and give this medicine. Allow the injection solution to come to room temperature before use. Do not warm it artificially. Use exactly as directed. Take your medicine at regular intervals. Do not take your medicine more often than directed. °It is important that you put your used needles and syringes in a special sharps container. Do not put them in a trash can. If you do not have a sharps container, call your pharmacist or healthcare provider to get one. °Talk to your pediatrician regarding the use of this medicine in children. Special care may be needed. °Overdosage: If you think you have taken too much of this medicine contact a poison control center or emergency room at once. °NOTE: This medicine is only for you. Do not share this medicine with others. °What if I miss a dose? °If you miss a dose, take it as soon as you can. If it is almost time for your next dose, take only that  dose. Do not take double or extra doses. °What may interact with this medicine? °Do not take this medicine with any of the following medications: °-cisapride °-droperidol °-general anesthetics °-grepafloxacin °-perphenazine °-thioridazine °This medicine may also interact with the following medications: °-bromocriptine °-cyclosporine °-diuretics °-medicines for blood pressure, heart disease, irregular heart beat °-medicines for diabetes, including insulin °-quinidine °This list may not describe all possible interactions. Give your health care provider a list of all the medicines, herbs, non-prescription drugs, or dietary supplements you use. Also tell them if you smoke, drink alcohol, or use illegal drugs. Some items may interact with your medicine. °What should I watch for while using this medicine? °Visit your doctor or health care professional for regular checks on your progress. °To help reduce irritation at the injection site, use a different site for each injection and make sure the solution is at room temperature before use. °This medicine may cause increases or decreases in blood sugar. Signs of high blood sugar include frequent urination, unusual thirst, flushed or dry skin, difficulty breathing, drowsiness, stomach ache, nausea, vomiting or dry mouth. Signs of low blood sugar include chills, cool, pale skin or cold sweats, drowsiness, extreme hunger, fast heartbeat, headache, nausea, nervousness or anxiety, shakiness, trembling, unsteadiness, tiredness, or weakness. Contact your doctor or health care professional right away if you experience any of these symptoms. °What side effects may I notice from receiving this medicine? °Side effects that you should report to your doctor or health care professional as soon as possible: °-allergic reactions like skin rash, itching or hives, swelling   of the face, lips, or tongue °-changes in blood sugar °-changes in heart rate °-severe stomach pain °Side effects that  usually do not require medical attention (report to your doctor or health care professional if they continue or are bothersome): °-diarrhea or constipation °-gas or stomach pain °-nausea, vomiting °-pain, redness, swelling and irritation at site where injected °This list may not describe all possible side effects. Call your doctor for medical advice about side effects. You may report side effects to FDA at 1-800-FDA-1088. °Where should I keep my medicine? °Keep out of the reach of children. °Store in a refrigerator between 2 and 8 degrees C (36 and 46 degrees F). Protect from light. Allow to come to room temperature naturally. Do not use artificial heat. If protected from light, the injection may be stored at room temperature between 20 and 30 degrees C (70 and 86 degrees F) for 14 days. After the initial use, throw away any unused portion of a multiple dose vial after 14 days. Throw away unused portions of the ampules after use. °NOTE: This sheet is a summary. It may not cover all possible information. If you have questions about this medicine, talk to your doctor, pharmacist, or health care provider. °© 2017 Elsevier/Gold Standard (2008-07-03 16:56:04) ° °

## 2017-01-25 ENCOUNTER — Ambulatory Visit (INDEPENDENT_AMBULATORY_CARE_PROVIDER_SITE_OTHER): Payer: Medicare Other | Admitting: Family Medicine

## 2017-01-25 ENCOUNTER — Encounter: Payer: Self-pay | Admitting: Family Medicine

## 2017-01-25 VITALS — BP 140/90 | Ht 73.0 in | Wt 217.2 lb

## 2017-01-25 DIAGNOSIS — Z125 Encounter for screening for malignant neoplasm of prostate: Secondary | ICD-10-CM | POA: Diagnosis not present

## 2017-01-25 DIAGNOSIS — I10 Essential (primary) hypertension: Secondary | ICD-10-CM

## 2017-01-25 DIAGNOSIS — N5201 Erectile dysfunction due to arterial insufficiency: Secondary | ICD-10-CM | POA: Diagnosis not present

## 2017-01-25 DIAGNOSIS — N400 Enlarged prostate without lower urinary tract symptoms: Secondary | ICD-10-CM

## 2017-01-25 DIAGNOSIS — Z79899 Other long term (current) drug therapy: Secondary | ICD-10-CM | POA: Diagnosis not present

## 2017-01-25 DIAGNOSIS — Z13220 Encounter for screening for lipoid disorders: Secondary | ICD-10-CM

## 2017-01-25 MED ORDER — AMLODIPINE BESYLATE 10 MG PO TABS: 10.0000 mg | ORAL_TABLET | Freq: Every day | ORAL | 5 refills | Status: DC

## 2017-01-25 MED ORDER — HYDROCHLOROTHIAZIDE 25 MG PO TABS: ORAL_TABLET | ORAL | 5 refills | Status: DC

## 2017-01-25 MED ORDER — LOSARTAN POTASSIUM 100 MG PO TABS: ORAL_TABLET | ORAL | 5 refills | Status: DC

## 2017-01-25 MED ORDER — POTASSIUM CHLORIDE CRYS ER 20 MEQ PO TBCR: 20.0000 meq | EXTENDED_RELEASE_TABLET | Freq: Every day | ORAL | 5 refills | Status: DC

## 2017-01-25 MED ORDER — TAMSULOSIN HCL 0.4 MG PO CAPS: 0.4000 mg | ORAL_CAPSULE | Freq: Every day | ORAL | 5 refills | Status: DC

## 2017-01-25 MED ORDER — HYDRALAZINE HCL 25 MG PO TABS: 25.0000 mg | ORAL_TABLET | Freq: Three times a day (TID) | ORAL | 5 refills | Status: DC

## 2017-01-25 NOTE — Progress Notes (Signed)
   Subjective:    Patient ID: Tommy Conley, male    DOB: Mar 22, 1951, 66 y.o.   MRN: [id number]  Hypertension  This is a chronic problem. The current episode started more than 1 year ago. Risk factors for coronary artery disease include male gender. Treatments tried: losartin, norvasc, apressoline,hctz. There are no compliance problems.    Urinating def better on the flomax. Very quickly improved urination. No obvious side effects. Compliant with medications. Would like to stay on it.  Erect dysfunction continues. States medication definitely helps. No obvious side effects. Would like refill on.  ibs symptoms ongoing. Uses antispasmodics faithfully. No drowsiness or sleepiness from definitely helps.   Patient has carcinoid cancer followed by specialist for this  Blood pressure medicine and blood pressure levels reviewed today with patient. Compliant with blood pressure medicine. States does not miss a dose. No obvious side effects. Blood pressure generally good when checked elsewhere. Watching salt intake.     Review of Systems No headache, no major weight loss or weight gain, no chest pain no back pain abdominal pain no change in bowel habits complete ROS otherwise negative     Objective:   Physical Exam Alert vitals stable, NAD. Blood pressure good on repeat. HEENT normal. Lungs clear. Heart regular rate and rhythm. Abdomen soft no discrete tenderness good bowel sounds       Assessment & Plan:  Impression 1 hypertension good control discussed maintain same #2 irritable bowel syndrome ongoing discussed maintain and I suspect #3 prostate hypertrophy. Considerable aggravation patient. Meds help. To maintain Flomax rationale discussed her 4 erectile dysfunction discussed maintain same meds #5 carcinoid tumor abdomen discussed followed by specialist and maintain all medications. Diet exercise discussed. Appropriate blood work further recommendations based results [facility]

## 2017-02-03 DIAGNOSIS — Z79899 Other long term (current) drug therapy: Secondary | ICD-10-CM | POA: Diagnosis not present

## 2017-02-03 DIAGNOSIS — Z125 Encounter for screening for malignant neoplasm of prostate: Secondary | ICD-10-CM | POA: Diagnosis not present

## 2017-02-03 DIAGNOSIS — Z13220 Encounter for screening for lipoid disorders: Secondary | ICD-10-CM | POA: Diagnosis not present

## 2017-02-04 LAB — BASIC METABOLIC PANEL
BUN / CREAT RATIO: 13 (ref 10–24)
BUN: 11 mg/dL (ref 8–27)
CALCIUM: 9.2 mg/dL (ref 8.6–10.2)
CHLORIDE: 102 mmol/L (ref 96–106)
CO2: 25 mmol/L (ref 18–29)
Creatinine, Ser: 0.85 mg/dL (ref 0.76–1.27)
GFR calc non Af Amer: 91 mL/min/{1.73_m2} (ref >59)
GFR, EST AFRICAN AMERICAN: 106 mL/min/{1.73_m2} (ref >59)
GLUCOSE: 91 mg/dL (ref 65–99)
POTASSIUM: 3.5 mmol/L (ref 3.5–5.2)
Sodium: 142 mmol/L (ref 134–144)

## 2017-02-04 LAB — HEPATIC FUNCTION PANEL
ALT: 20 IU/L (ref 0–44)
AST: 20 IU/L (ref 0–40)
Albumin: 4 g/dL (ref 3.6–4.8)
Alkaline Phosphatase: 130 IU/L — ABNORMAL HIGH (ref 39–117)
BILIRUBIN TOTAL: 0.9 mg/dL (ref 0.0–1.2)
Bilirubin, Direct: 0.33 mg/dL (ref 0.00–0.40)
Total Protein: 6.7 g/dL (ref 6.0–8.5)

## 2017-02-04 LAB — LIPID PANEL
CHOLESTEROL TOTAL: 171 mg/dL (ref 100–199)
Chol/HDL Ratio: 2.9 ratio units (ref 0.0–5.0)
HDL: 60 mg/dL (ref >39)
LDL CALC: 98 mg/dL (ref 0–99)
TRIGLYCERIDES: 63 mg/dL (ref 0–149)
VLDL Cholesterol Cal: 13 mg/dL (ref 5–40)

## 2017-02-04 LAB — PSA: PROSTATE SPECIFIC AG, SERUM: 1.4 ng/mL (ref 0.0–4.0)

## 2017-02-09 ENCOUNTER — Other Ambulatory Visit: Payer: Self-pay | Admitting: Family Medicine

## 2017-02-10 ENCOUNTER — Encounter: Payer: Self-pay | Admitting: Family Medicine

## 2017-02-16 ENCOUNTER — Telehealth: Payer: Self-pay | Admitting: *Deleted

## 2017-02-16 NOTE — Telephone Encounter (Signed)
Called pt with appt change. He agrees to come in to see Dr. Benay Spice at 0800 on 3/1. Collaborative RN will administer injection and lab appt will be rescheduled to after visit.

## 2017-02-18 ENCOUNTER — Ambulatory Visit (HOSPITAL_BASED_OUTPATIENT_CLINIC_OR_DEPARTMENT_OTHER): Payer: Medicare Other

## 2017-02-18 ENCOUNTER — Other Ambulatory Visit (HOSPITAL_BASED_OUTPATIENT_CLINIC_OR_DEPARTMENT_OTHER): Payer: Medicare Other

## 2017-02-18 ENCOUNTER — Ambulatory Visit (HOSPITAL_BASED_OUTPATIENT_CLINIC_OR_DEPARTMENT_OTHER): Payer: Medicare Other | Admitting: Oncology

## 2017-02-18 VITALS — BP 150/97 | HR 82 | Temp 98.0°F | Resp 18 | Ht 73.0 in | Wt 220.8 lb

## 2017-02-18 DIAGNOSIS — R1907 Generalized intra-abdominal and pelvic swelling, mass and lump: Secondary | ICD-10-CM

## 2017-02-18 DIAGNOSIS — Z23 Encounter for immunization: Secondary | ICD-10-CM | POA: Diagnosis not present

## 2017-02-18 DIAGNOSIS — E340 Carcinoid syndrome: Secondary | ICD-10-CM

## 2017-02-18 DIAGNOSIS — C7A021 Malignant carcinoid tumor of the cecum: Secondary | ICD-10-CM

## 2017-02-18 DIAGNOSIS — C7A012 Malignant carcinoid tumor of the ileum: Secondary | ICD-10-CM

## 2017-02-18 DIAGNOSIS — C7B02 Secondary carcinoid tumors of liver: Secondary | ICD-10-CM

## 2017-02-18 DIAGNOSIS — I10 Essential (primary) hypertension: Secondary | ICD-10-CM

## 2017-02-18 MED ORDER — OCTREOTIDE ACETATE 20 MG IM KIT
20.0000 mg | PACK | Freq: Once | INTRAMUSCULAR | Status: AC
  Administered 2017-02-18: 20 mg via INTRAMUSCULAR
  Filled 2017-02-18: qty 1

## 2017-02-18 NOTE — Patient Instructions (Signed)
Octreotide injection solution °What is this medicine? °OCTREOTIDE (ok TREE oh tide) is used to reduce blood levels of growth hormone in patients with a condition called acromegaly. This medicine also reduces flushing and watery diarrhea caused by certain types of cancer. °This medicine may be used for other purposes; ask your health care provider or pharmacist if you have questions. °COMMON BRAND NAME(S): Sandostatin, Sandostatin LAR °What should I tell my health care provider before I take this medicine? °They need to know if you have any of these conditions: °-gallbladder disease °-kidney disease °-liver disease °-an unusual or allergic reaction to octreotide, other medicines, foods, dyes, or preservatives °-pregnant or trying to get pregnant °-breast-feeding °How should I use this medicine? °This medicine is for injection under the skin or into a vein (only in emergency situations). It is usually given by a health care professional in a hospital or clinic setting. °If you get this medicine at home, you will be taught how to prepare and give this medicine. Allow the injection solution to come to room temperature before use. Do not warm it artificially. Use exactly as directed. Take your medicine at regular intervals. Do not take your medicine more often than directed. °It is important that you put your used needles and syringes in a special sharps container. Do not put them in a trash can. If you do not have a sharps container, call your pharmacist or healthcare provider to get one. °Talk to your pediatrician regarding the use of this medicine in children. Special care may be needed. °Overdosage: If you think you have taken too much of this medicine contact a poison control center or emergency room at once. °NOTE: This medicine is only for you. Do not share this medicine with others. °What if I miss a dose? °If you miss a dose, take it as soon as you can. If it is almost time for your next dose, take only that  dose. Do not take double or extra doses. °What may interact with this medicine? °Do not take this medicine with any of the following medications: °-cisapride °-droperidol °-general anesthetics °-grepafloxacin °-perphenazine °-thioridazine °This medicine may also interact with the following medications: °-bromocriptine °-cyclosporine °-diuretics °-medicines for blood pressure, heart disease, irregular heart beat °-medicines for diabetes, including insulin °-quinidine °This list may not describe all possible interactions. Give your health care provider a list of all the medicines, herbs, non-prescription drugs, or dietary supplements you use. Also tell them if you smoke, drink alcohol, or use illegal drugs. Some items may interact with your medicine. °What should I watch for while using this medicine? °Visit your doctor or health care professional for regular checks on your progress. °To help reduce irritation at the injection site, use a different site for each injection and make sure the solution is at room temperature before use. °This medicine may cause increases or decreases in blood sugar. Signs of high blood sugar include frequent urination, unusual thirst, flushed or dry skin, difficulty breathing, drowsiness, stomach ache, nausea, vomiting or dry mouth. Signs of low blood sugar include chills, cool, pale skin or cold sweats, drowsiness, extreme hunger, fast heartbeat, headache, nausea, nervousness or anxiety, shakiness, trembling, unsteadiness, tiredness, or weakness. Contact your doctor or health care professional right away if you experience any of these symptoms. °What side effects may I notice from receiving this medicine? °Side effects that you should report to your doctor or health care professional as soon as possible: °-allergic reactions like skin rash, itching or hives, swelling   of the face, lips, or tongue °-changes in blood sugar °-changes in heart rate °-severe stomach pain °Side effects that  usually do not require medical attention (report to your doctor or health care professional if they continue or are bothersome): °-diarrhea or constipation °-gas or stomach pain °-nausea, vomiting °-pain, redness, swelling and irritation at site where injected °This list may not describe all possible side effects. Call your doctor for medical advice about side effects. You may report side effects to FDA at 1-800-FDA-1088. °Where should I keep my medicine? °Keep out of the reach of children. °Store in a refrigerator between 2 and 8 degrees C (36 and 46 degrees F). Protect from light. Allow to come to room temperature naturally. Do not use artificial heat. If protected from light, the injection may be stored at room temperature between 20 and 30 degrees C (70 and 86 degrees F) for 14 days. After the initial use, throw away any unused portion of a multiple dose vial after 14 days. Throw away unused portions of the ampules after use. °NOTE: This sheet is a summary. It may not cover all possible information. If you have questions about this medicine, talk to your doctor, pharmacist, or health care provider. °© 2017 Elsevier/Gold Standard (2008-07-03 16:56:04) ° °

## 2017-02-18 NOTE — Progress Notes (Signed)
[facility] OFFICE PROGRESS NOTE   Diagnosis: Carcinoid tumor  INTERVAL HISTORY:   [name] returns as scheduled. He continues monthly Sandostatin. He reports an improved energy level. He underwent a Net spot imaging study on 01/14/2017. He reports diarrhea for 3 days following the study. He had associated decreased "balance ". He stayed in the bed for most of 3 days and the symptoms spontaneously resolved.  The DOTATATE study confirmed increased uptake in the right hepatic lobe mass and smaller left hepatic lesion. In addition a mesenteric nodule had radiotracer uptake.  Objective:  Vital signs in last 24 hours:  Blood pressure (!) 150/97, pulse 82, temperature 98 F (36.7 C), resp. rate 18, height 6\' 1"  (1.854 m), weight 220 lb 12.8 oz (100.2 kg), SpO2 100 %.   Resp: Decreased breath sounds with inspiratory rhonchi at the left greater than right base, no respiratory distress Cardio: Regular rate and rhythm GI: No hepatosplenomegaly, nontender, no mass Vascular: No leg edema    Imaging:  DOTATATE scan reviewed with Mr. Perteet.  Medications: I have reviewed the patient's current medications.  Assessment/Plan: 1. 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  1. Status post a right colon/ileum resection on 09/29/2011.  2. Wedge biopsy of a right liver lesion on 09/29/2011 confirmed metastatic well-differentiated neuroendocrine carcinoma.  3. CT of the abdomen 08/25/2011 consistent with multiple liver metastases.  4. Elevated preoperative chromogranin A level and a 24-hour urine 5-HIAA level. Stable on repeat 11/16/2011. 5. Restaging CT on 12/28/2011 confirmed slight progression of the metastatic liver lesions. 6. Status post 3 cycles of Xeloda/temozolomide with cycle 3 initiated on 03/12/2012 7. Restaging CT of the abdomen 04/04/2012 revealed slight progression of the metastatic  liver lesions and transverse mesocolon adenopathy. 8. Status post Y-90 radioembolization of the right liver on 05/19/2012. 9. Status post Y-90 radioembolization of the left liver on 07/06/2012. 10. Chromogranin A level decreased on 08/29/2012, 24-hour urine 5 HIAA slightly increased on 09/05/2012 11. Restaging CT on 12/05/2012 with a mixed response in the liver 12. Restaging CT 08/11/2013-mild increase in the size of liver lesions. 13. Restaging CT 01/22/2014-stable to slightly improved liver lesions, mild increase in mesenteric adenopathy. Chromogranin A level stable. 14. Chromogranin A level stable 05/01/2014. 15. Chromogranin A higher on 08/02/2014 16. Restaging CT 11/06/2014 with interval enlargement of the enhancing lesions within the left and right hepatic lobe. 17. Initiation of monthly Sandostatin 12/24/2014 18. Status post Y-90 radioembolization of the right liver on 01/03/2015 19. Chromogranin A level stable 02/18/2015 20. Chromogranin A level improved 08/12/2015 21. Restaging CT 09/11/2015 with mild increase in the size of the dominant liver metastasis in the right hepatic lobe. Other smaller liver metastases showed no significant change. Stable mild lymphadenopathy in central small bowel mesentery and right cardiophrenic angle. Stable subcm right middle lobe pulmonary nodule. 22. Monthly Sandostatin continued 23. Gallium-DOTATATE 01/14/2017-increased activity of a dominant right liver mass and left liver lesion. Increased tracer accumulation in a mesenteric mass 2. History of diarrhea (3 to 4 times per day), predating surgery for 8 months. Decrease in daily bowel movements coinciding with discontinuation of Nexium. 3. Hypertension. 4. History of gastroesophageal reflux disease. No reflux symptoms since discontinuing Nexium. 5. Rectal and ascending colon polyps. He underwent a colonoscopy on 12/20/2012 with findings of a sessile polyp in the ascending colon and a sessile polyp in the  rectum. Pathology showed hyperplastic polyps. Prior right colon resection noted with a normal appearing  ileocolonic anastomosis. Small internal hemorrhoids noted. Repeat colonoscopy in 5 years. 6. History of Iron deficiency anemia 7. 08/12/2016 syncopal episode status post evaluation in the emergency department    Disposition:  Tommy Conley appears well. Net Spot scan confirmed increased accumulation in his tumor. I reviewed the images with him. He appears to be a candidate for Lutathera . We should have this agent available within the next few months. He will continue monthly Sandostatin for now. Mr. Reitmeier will return for an office visit and Sandostatin in one month. The etiology of his symptoms following the imaging scan last month is unclear. He will contact us for recurrent balance difficulty. 25 minutes were spent with the patient today. The majority of the time was used for counseling and coordination of care.  Betsy Coder, MD  02/18/2017  8:21 AM

## 2017-02-23 LAB — CHROMOGRANIN A: CHROMOGRAN A: 70 nmol/L — AB (ref 0–5)

## 2017-02-28 ENCOUNTER — Other Ambulatory Visit: Payer: Self-pay | Admitting: Family Medicine

## 2017-03-18 ENCOUNTER — Ambulatory Visit (HOSPITAL_BASED_OUTPATIENT_CLINIC_OR_DEPARTMENT_OTHER): Payer: Medicare Other | Admitting: Nurse Practitioner

## 2017-03-18 ENCOUNTER — Ambulatory Visit (HOSPITAL_BASED_OUTPATIENT_CLINIC_OR_DEPARTMENT_OTHER): Payer: Medicare Other

## 2017-03-18 VITALS — BP 148/92 | HR 88 | Temp 98.2°F | Resp 18 | Ht 73.0 in | Wt 226.0 lb

## 2017-03-18 DIAGNOSIS — C7B02 Secondary carcinoid tumors of liver: Secondary | ICD-10-CM

## 2017-03-18 DIAGNOSIS — R197 Diarrhea, unspecified: Secondary | ICD-10-CM

## 2017-03-18 DIAGNOSIS — E340 Carcinoid syndrome: Secondary | ICD-10-CM

## 2017-03-18 DIAGNOSIS — I10 Essential (primary) hypertension: Secondary | ICD-10-CM | POA: Diagnosis not present

## 2017-03-18 DIAGNOSIS — C7A021 Malignant carcinoid tumor of the cecum: Secondary | ICD-10-CM

## 2017-03-18 DIAGNOSIS — C7A012 Malignant carcinoid tumor of the ileum: Secondary | ICD-10-CM | POA: Diagnosis not present

## 2017-03-18 MED ORDER — OCTREOTIDE ACETATE 20 MG IM KIT
20.0000 mg | PACK | Freq: Once | INTRAMUSCULAR | Status: AC
  Administered 2017-03-18: 20 mg via INTRAMUSCULAR
  Filled 2017-03-18: qty 1

## 2017-03-18 NOTE — Patient Instructions (Signed)
Octreotide injection solution °What is this medicine? °OCTREOTIDE (ok TREE oh tide) is used to reduce blood levels of growth hormone in patients with a condition called acromegaly. This medicine also reduces flushing and watery diarrhea caused by certain types of cancer. °This medicine may be used for other purposes; ask your health care provider or pharmacist if you have questions. °COMMON BRAND NAME(S): Sandostatin, Sandostatin LAR °What should I tell my health care provider before I take this medicine? °They need to know if you have any of these conditions: °-gallbladder disease °-kidney disease °-liver disease °-an unusual or allergic reaction to octreotide, other medicines, foods, dyes, or preservatives °-pregnant or trying to get pregnant °-breast-feeding °How should I use this medicine? °This medicine is for injection under the skin or into a vein (only in emergency situations). It is usually given by a health care professional in a hospital or clinic setting. °If you get this medicine at home, you will be taught how to prepare and give this medicine. Allow the injection solution to come to room temperature before use. Do not warm it artificially. Use exactly as directed. Take your medicine at regular intervals. Do not take your medicine more often than directed. °It is important that you put your used needles and syringes in a special sharps container. Do not put them in a trash can. If you do not have a sharps container, call your pharmacist or healthcare provider to get one. °Talk to your pediatrician regarding the use of this medicine in children. Special care may be needed. °Overdosage: If you think you have taken too much of this medicine contact a poison control center or emergency room at once. °NOTE: This medicine is only for you. Do not share this medicine with others. °What if I miss a dose? °If you miss a dose, take it as soon as you can. If it is almost time for your next dose, take only that  dose. Do not take double or extra doses. °What may interact with this medicine? °Do not take this medicine with any of the following medications: °-cisapride °-droperidol °-general anesthetics °-grepafloxacin °-perphenazine °-thioridazine °This medicine may also interact with the following medications: °-bromocriptine °-cyclosporine °-diuretics °-medicines for blood pressure, heart disease, irregular heart beat °-medicines for diabetes, including insulin °-quinidine °This list may not describe all possible interactions. Give your health care provider a list of all the medicines, herbs, non-prescription drugs, or dietary supplements you use. Also tell them if you smoke, drink alcohol, or use illegal drugs. Some items may interact with your medicine. °What should I watch for while using this medicine? °Visit your doctor or health care professional for regular checks on your progress. °To help reduce irritation at the injection site, use a different site for each injection and make sure the solution is at room temperature before use. °This medicine may cause increases or decreases in blood sugar. Signs of high blood sugar include frequent urination, unusual thirst, flushed or dry skin, difficulty breathing, drowsiness, stomach ache, nausea, vomiting or dry mouth. Signs of low blood sugar include chills, cool, pale skin or cold sweats, drowsiness, extreme hunger, fast heartbeat, headache, nausea, nervousness or anxiety, shakiness, trembling, unsteadiness, tiredness, or weakness. Contact your doctor or health care professional right away if you experience any of these symptoms. °What side effects may I notice from receiving this medicine? °Side effects that you should report to your doctor or health care professional as soon as possible: °-allergic reactions like skin rash, itching or hives, swelling   of the face, lips, or tongue °-changes in blood sugar °-changes in heart rate °-severe stomach pain °Side effects that  usually do not require medical attention (report to your doctor or health care professional if they continue or are bothersome): °-diarrhea or constipation °-gas or stomach pain °-nausea, vomiting °-pain, redness, swelling and irritation at site where injected °This list may not describe all possible side effects. Call your doctor for medical advice about side effects. You may report side effects to FDA at 1-800-FDA-1088. °Where should I keep my medicine? °Keep out of the reach of children. °Store in a refrigerator between 2 and 8 degrees C (36 and 46 degrees F). Protect from light. Allow to come to room temperature naturally. Do not use artificial heat. If protected from light, the injection may be stored at room temperature between 20 and 30 degrees C (70 and 86 degrees F) for 14 days. After the initial use, throw away any unused portion of a multiple dose vial after 14 days. Throw away unused portions of the ampules after use. °NOTE: This sheet is a summary. It may not cover all possible information. If you have questions about this medicine, talk to your doctor, pharmacist, or health care provider. °© 2017 Elsevier/Gold Standard (2008-07-03 16:56:04) ° °

## 2017-03-18 NOTE — Progress Notes (Signed)
[facility] OFFICE PROGRESS NOTE   Diagnosis:  Carcinoid tumor  INTERVAL HISTORY:   Tommy Conley returns as scheduled. He continues monthly Sandostatin. He feels well. Appetite varies. He has occasional loose stools. No flushing.  Objective:  Vital signs in last 24 hours:  Blood pressure (!) 148/92, pulse 88, temperature 98.2 F (36.8 C), temperature source Oral, resp. rate 18, height 6\' 1"  (1.854 m), weight 226 lb (102.5 kg), SpO2 100 %.    HEENT: No thrush or ulcers. Lymphatics: No palpable cervical or supraclavicular lymph nodes. Resp: Lungs clear bilaterally. Cardio: Regular rate and rhythm. GI: Abdomen soft and nontender. No hepatomegaly. Vascular: No leg edema.  Lab Results:  Lab Results  Component Value Date   WBC 10.0 08/12/2016   HGB 12.8 (L) 08/12/2016   HCT 38.4 (L) 08/12/2016   MCV 83.5 08/12/2016   PLT 175 08/12/2016   NEUTROABS 7.8 (H) 08/12/2016    Imaging:  No results found.  Medications: I have reviewed the patient's current medications.  Assessment/Plan: 1. 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  1. Status post a right colon/ileum resection on 09/29/2011.  2. Wedge biopsy of a right liver lesion on 09/29/2011 confirmed metastatic well-differentiated neuroendocrine carcinoma.  3. CT of the abdomen 08/25/2011 consistent with multiple liver metastases.  4. Elevated preoperative chromogranin A level and a 24-hour urine 5-HIAA level. Stable on repeat 11/16/2011. 5. Restaging CT on 12/28/2011 confirmed slight progression of the metastatic liver lesions. 6. Status post 3 cycles of Xeloda/temozolomide with cycle 3 initiated on 03/12/2012 7. Restaging CT of the abdomen 04/04/2012 revealed slight progression of the metastatic liver lesions and transverse mesocolon adenopathy. 8. Status post Y-90 radioembolization of the right liver on  05/19/2012. 9. Status post Y-90 radioembolization of the left liver on 07/06/2012. 10. Chromogranin A level decreased on 08/29/2012, 24-hour urine 5 HIAA slightly increased on 09/05/2012 11. Restaging CT on 12/05/2012 with a mixed response in the liver 12. Restaging CT 08/11/2013-mild increase in the size of liver lesions. 13. Restaging CT 01/22/2014-stable to slightly improved liver lesions, mild increase in mesenteric adenopathy. Chromogranin A level stable. 14. Chromogranin A level stable 05/01/2014. 15. Chromogranin A higher on 08/02/2014 16. Restaging CT 11/06/2014 with interval enlargement of the enhancing lesions within the left and right hepatic lobe. 17. Initiation of monthly Sandostatin 12/24/2014 18. Status post Y-90 radioembolization of the right liver on 01/03/2015 19. Chromogranin A level stable 02/18/2015 20. Chromogranin A level improved 08/12/2015 21. Restaging CT 09/11/2015 with mild increase in the size of the dominant liver metastasis in the right hepatic lobe. Other smaller liver metastases showed no significant change. Stable mild lymphadenopathy in central small bowel mesentery and right cardiophrenic angle. Stable subcm right middle lobe pulmonary nodule. 22. Monthly Sandostatin continued 23. Gallium-DOTATATE 01/14/2017-increased activity of a dominant right liver mass and left liver lesion. Increased tracer accumulation in a mesenteric mass 2. History of diarrhea (3 to 4 times per day), predating surgery for 8 months. Decrease in daily bowel movements coinciding with discontinuation of Nexium. 3. Hypertension. 4. History of gastroesophageal reflux disease. No reflux symptoms since discontinuing Nexium. 5. Rectal and ascending colon polyps. He underwent a colonoscopy on 12/20/2012 with findings of a sessile polyp in the ascending colon and a sessile polyp in the rectum. Pathology showed hyperplastic polyps. Prior right colon resection noted with a normal appearing  ileocolonic anastomosis. Small internal hemorrhoids noted. Repeat colonoscopy in 5 years. 6. History of Iron  deficiency anemia 7. 08/12/2016 syncopal episode status post evaluation in the emergency department   Disposition: Tommy Conley appears stable. The plan is to proceed with Lutathera when available, continue monthly Sandostatin for now. He will return for Sandostatin injections in 4 weeks and 8 weeks. We will see him in follow-up in 12 weeks.  Patient seen with Dr. Benay Spice.    Tommy Conley ANP/GNP-BC   03/18/2017  10:49 AM  His was a shared visit with Tommy Conley. We discussed the data supporting the use of Lutathera In the potential toxicities associated with this treatment. He would like to proceed with Lutathera when this becomes available.  Julieanne Manson, M.D.

## 2017-03-19 ENCOUNTER — Other Ambulatory Visit: Payer: Self-pay | Admitting: Family Medicine

## 2017-03-19 ENCOUNTER — Telehealth: Payer: Self-pay | Admitting: Oncology

## 2017-03-19 NOTE — Telephone Encounter (Signed)
sw pt to confirm injection appts in April per LOS

## 2017-03-19 NOTE — Telephone Encounter (Signed)
Ok six mo worth 

## 2017-04-07 ENCOUNTER — Other Ambulatory Visit: Payer: Self-pay | Admitting: Family Medicine

## 2017-04-08 NOTE — Telephone Encounter (Signed)
Six mo worth ok 

## 2017-04-15 ENCOUNTER — Ambulatory Visit (HOSPITAL_BASED_OUTPATIENT_CLINIC_OR_DEPARTMENT_OTHER): Payer: Medicare Other

## 2017-04-15 VITALS — BP 152/95 | HR 86 | Temp 97.8°F | Resp 20

## 2017-04-15 DIAGNOSIS — C7B02 Secondary carcinoid tumors of liver: Secondary | ICD-10-CM

## 2017-04-15 DIAGNOSIS — E340 Carcinoid syndrome: Secondary | ICD-10-CM | POA: Diagnosis present

## 2017-04-15 DIAGNOSIS — C7A012 Malignant carcinoid tumor of the ileum: Secondary | ICD-10-CM | POA: Diagnosis not present

## 2017-04-15 DIAGNOSIS — C7A021 Malignant carcinoid tumor of the cecum: Secondary | ICD-10-CM

## 2017-04-15 MED ORDER — OCTREOTIDE ACETATE 20 MG IM KIT
20.0000 mg | PACK | Freq: Once | INTRAMUSCULAR | Status: AC
  Administered 2017-04-15: 20 mg via INTRAMUSCULAR
  Filled 2017-04-15: qty 1

## 2017-04-15 NOTE — Patient Instructions (Signed)
Octreotide injection solution °What is this medicine? °OCTREOTIDE (ok TREE oh tide) is used to reduce blood levels of growth hormone in patients with a condition called acromegaly. This medicine also reduces flushing and watery diarrhea caused by certain types of cancer. °This medicine may be used for other purposes; ask your health care provider or pharmacist if you have questions. °COMMON BRAND NAME(S): Sandostatin, Sandostatin LAR °What should I tell my health care provider before I take this medicine? °They need to know if you have any of these conditions: °-gallbladder disease °-kidney disease °-liver disease °-an unusual or allergic reaction to octreotide, other medicines, foods, dyes, or preservatives °-pregnant or trying to get pregnant °-breast-feeding °How should I use this medicine? °This medicine is for injection under the skin or into a vein (only in emergency situations). It is usually given by a health care professional in a hospital or clinic setting. °If you get this medicine at home, you will be taught how to prepare and give this medicine. Allow the injection solution to come to room temperature before use. Do not warm it artificially. Use exactly as directed. Take your medicine at regular intervals. Do not take your medicine more often than directed. °It is important that you put your used needles and syringes in a special sharps container. Do not put them in a trash can. If you do not have a sharps container, call your pharmacist or healthcare provider to get one. °Talk to your pediatrician regarding the use of this medicine in children. Special care may be needed. °Overdosage: If you think you have taken too much of this medicine contact a poison control center or emergency room at once. °NOTE: This medicine is only for you. Do not share this medicine with others. °What if I miss a dose? °If you miss a dose, take it as soon as you can. If it is almost time for your next dose, take only that  dose. Do not take double or extra doses. °What may interact with this medicine? °Do not take this medicine with any of the following medications: °-cisapride °-droperidol °-general anesthetics °-grepafloxacin °-perphenazine °-thioridazine °This medicine may also interact with the following medications: °-bromocriptine °-cyclosporine °-diuretics °-medicines for blood pressure, heart disease, irregular heart beat °-medicines for diabetes, including insulin °-quinidine °This list may not describe all possible interactions. Give your health care provider a list of all the medicines, herbs, non-prescription drugs, or dietary supplements you use. Also tell them if you smoke, drink alcohol, or use illegal drugs. Some items may interact with your medicine. °What should I watch for while using this medicine? °Visit your doctor or health care professional for regular checks on your progress. °To help reduce irritation at the injection site, use a different site for each injection and make sure the solution is at room temperature before use. °This medicine may cause increases or decreases in blood sugar. Signs of high blood sugar include frequent urination, unusual thirst, flushed or dry skin, difficulty breathing, drowsiness, stomach ache, nausea, vomiting or dry mouth. Signs of low blood sugar include chills, cool, pale skin or cold sweats, drowsiness, extreme hunger, fast heartbeat, headache, nausea, nervousness or anxiety, shakiness, trembling, unsteadiness, tiredness, or weakness. Contact your doctor or health care professional right away if you experience any of these symptoms. °What side effects may I notice from receiving this medicine? °Side effects that you should report to your doctor or health care professional as soon as possible: °-allergic reactions like skin rash, itching or hives, swelling   of the face, lips, or tongue °-changes in blood sugar °-changes in heart rate °-severe stomach pain °Side effects that  usually do not require medical attention (report to your doctor or health care professional if they continue or are bothersome): °-diarrhea or constipation °-gas or stomach pain °-nausea, vomiting °-pain, redness, swelling and irritation at site where injected °This list may not describe all possible side effects. Call your doctor for medical advice about side effects. You may report side effects to FDA at 1-800-FDA-1088. °Where should I keep my medicine? °Keep out of the reach of children. °Store in a refrigerator between 2 and 8 degrees C (36 and 46 degrees F). Protect from light. Allow to come to room temperature naturally. Do not use artificial heat. If protected from light, the injection may be stored at room temperature between 20 and 30 degrees C (70 and 86 degrees F) for 14 days. After the initial use, throw away any unused portion of a multiple dose vial after 14 days. Throw away unused portions of the ampules after use. °NOTE: This sheet is a summary. It may not cover all possible information. If you have questions about this medicine, talk to your doctor, pharmacist, or health care provider. °© 2017 Elsevier/Gold Standard (2008-07-03 16:56:04) ° °

## 2017-05-03 ENCOUNTER — Ambulatory Visit (INDEPENDENT_AMBULATORY_CARE_PROVIDER_SITE_OTHER): Payer: Medicare Other | Admitting: Family Medicine

## 2017-05-03 ENCOUNTER — Encounter: Payer: Self-pay | Admitting: Family Medicine

## 2017-05-03 VITALS — BP 158/98 | Ht 73.0 in | Wt 222.0 lb

## 2017-05-03 DIAGNOSIS — I10 Essential (primary) hypertension: Secondary | ICD-10-CM | POA: Diagnosis not present

## 2017-05-03 MED ORDER — HYDRALAZINE HCL 50 MG PO TABS: 50.0000 mg | ORAL_TABLET | Freq: Three times a day (TID) | ORAL | 5 refills | Status: DC

## 2017-05-03 NOTE — Progress Notes (Signed)
   Subjective:    Patient ID: Tommy Conley, male    DOB: [birth year], 66 y.o.   MRN: [id number]  Hypertension  This is a chronic problem. There are no compliance problems (eats healthy, exercises, takes meds every day).   having headaches and dizziness for the past week.    Claims full compliance with blood pressure medication.  Long-standing history of hypertension.  Watching salt intake. Not exercising regularly.  Blood pressures often elevated when going to the specialty clinic 4 carcinoid tumor treatments                                                Headache diffuse in nature, worse on the right side  Sticking with bp meds, up quite a bit at times, seems to be higher sinc3   Review of Systems No headache, no major weight loss or weight gain, no chest pain no back pain abdominal pain no change in bowel habits complete ROS otherwise negative     Objective:   Physical Exam  Blood pressure 144/92 on repeat Alert vitals stable, NAD. Blood pressure on r above epeat. HEENT normal. Lungs clear. Heart regular rate and rhythm.       Assessment & Plan:   impression hypertension suboptimal control discussed may be related to #2 #2 carcinoid tumor ongoing challenges with aggressive subspecialty treatment plan increase hydralazine to 50 mg 3 times a day maintain other meds diet exercise discussed WSL

## 2017-05-13 ENCOUNTER — Ambulatory Visit (HOSPITAL_BASED_OUTPATIENT_CLINIC_OR_DEPARTMENT_OTHER): Payer: Medicare Other

## 2017-05-13 VITALS — BP 143/90 | HR 84 | Temp 97.3°F | Resp 18

## 2017-05-13 DIAGNOSIS — C7B02 Secondary carcinoid tumors of liver: Secondary | ICD-10-CM

## 2017-05-13 DIAGNOSIS — E340 Carcinoid syndrome: Secondary | ICD-10-CM

## 2017-05-13 DIAGNOSIS — C7A012 Malignant carcinoid tumor of the ileum: Secondary | ICD-10-CM | POA: Diagnosis not present

## 2017-05-13 DIAGNOSIS — C7A021 Malignant carcinoid tumor of the cecum: Secondary | ICD-10-CM

## 2017-05-13 MED ORDER — OCTREOTIDE ACETATE 20 MG IM KIT
20.0000 mg | PACK | Freq: Once | INTRAMUSCULAR | Status: AC
  Administered 2017-05-13: 20 mg via INTRAMUSCULAR
  Filled 2017-05-13: qty 1

## 2017-05-13 NOTE — Patient Instructions (Signed)
Octreotide injection solution °What is this medicine? °OCTREOTIDE (ok TREE oh tide) is used to reduce blood levels of growth hormone in patients with a condition called acromegaly. This medicine also reduces flushing and watery diarrhea caused by certain types of cancer. °This medicine may be used for other purposes; ask your health care provider or pharmacist if you have questions. °COMMON BRAND NAME(S): Sandostatin, Sandostatin LAR °What should I tell my health care provider before I take this medicine? °They need to know if you have any of these conditions: °-gallbladder disease °-kidney disease °-liver disease °-an unusual or allergic reaction to octreotide, other medicines, foods, dyes, or preservatives °-pregnant or trying to get pregnant °-breast-feeding °How should I use this medicine? °This medicine is for injection under the skin or into a vein (only in emergency situations). It is usually given by a health care professional in a hospital or clinic setting. °If you get this medicine at home, you will be taught how to prepare and give this medicine. Allow the injection solution to come to room temperature before use. Do not warm it artificially. Use exactly as directed. Take your medicine at regular intervals. Do not take your medicine more often than directed. °It is important that you put your used needles and syringes in a special sharps container. Do not put them in a trash can. If you do not have a sharps container, call your pharmacist or healthcare provider to get one. °Talk to your pediatrician regarding the use of this medicine in children. Special care may be needed. °Overdosage: If you think you have taken too much of this medicine contact a poison control center or emergency room at once. °NOTE: This medicine is only for you. Do not share this medicine with others. °What if I miss a dose? °If you miss a dose, take it as soon as you can. If it is almost time for your next dose, take only that  dose. Do not take double or extra doses. °What may interact with this medicine? °Do not take this medicine with any of the following medications: °-cisapride °-droperidol °-general anesthetics °-grepafloxacin °-perphenazine °-thioridazine °This medicine may also interact with the following medications: °-bromocriptine °-cyclosporine °-diuretics °-medicines for blood pressure, heart disease, irregular heart beat °-medicines for diabetes, including insulin °-quinidine °This list may not describe all possible interactions. Give your health care provider a list of all the medicines, herbs, non-prescription drugs, or dietary supplements you use. Also tell them if you smoke, drink alcohol, or use illegal drugs. Some items may interact with your medicine. °What should I watch for while using this medicine? °Visit your doctor or health care professional for regular checks on your progress. °To help reduce irritation at the injection site, use a different site for each injection and make sure the solution is at room temperature before use. °This medicine may cause increases or decreases in blood sugar. Signs of high blood sugar include frequent urination, unusual thirst, flushed or dry skin, difficulty breathing, drowsiness, stomach ache, nausea, vomiting or dry mouth. Signs of low blood sugar include chills, cool, pale skin or cold sweats, drowsiness, extreme hunger, fast heartbeat, headache, nausea, nervousness or anxiety, shakiness, trembling, unsteadiness, tiredness, or weakness. Contact your doctor or health care professional right away if you experience any of these symptoms. °What side effects may I notice from receiving this medicine? °Side effects that you should report to your doctor or health care professional as soon as possible: °-allergic reactions like skin rash, itching or hives, swelling   of the face, lips, or tongue °-changes in blood sugar °-changes in heart rate °-severe stomach pain °Side effects that  usually do not require medical attention (report to your doctor or health care professional if they continue or are bothersome): °-diarrhea or constipation °-gas or stomach pain °-nausea, vomiting °-pain, redness, swelling and irritation at site where injected °This list may not describe all possible side effects. Call your doctor for medical advice about side effects. You may report side effects to FDA at 1-800-FDA-1088. °Where should I keep my medicine? °Keep out of the reach of children. °Store in a refrigerator between 2 and 8 degrees C (36 and 46 degrees F). Protect from light. Allow to come to room temperature naturally. Do not use artificial heat. If protected from light, the injection may be stored at room temperature between 20 and 30 degrees C (70 and 86 degrees F) for 14 days. After the initial use, throw away any unused portion of a multiple dose vial after 14 days. Throw away unused portions of the ampules after use. °NOTE: This sheet is a summary. It may not cover all possible information. If you have questions about this medicine, talk to your doctor, pharmacist, or health care provider. °© 2017 Elsevier/Gold Standard (2008-07-03 16:56:04) ° °

## 2017-06-10 ENCOUNTER — Ambulatory Visit (HOSPITAL_BASED_OUTPATIENT_CLINIC_OR_DEPARTMENT_OTHER): Payer: Medicare Other

## 2017-06-10 ENCOUNTER — Ambulatory Visit (HOSPITAL_BASED_OUTPATIENT_CLINIC_OR_DEPARTMENT_OTHER): Payer: Medicare Other | Admitting: Oncology

## 2017-06-10 ENCOUNTER — Other Ambulatory Visit (HOSPITAL_BASED_OUTPATIENT_CLINIC_OR_DEPARTMENT_OTHER): Payer: Medicare Other

## 2017-06-10 ENCOUNTER — Telehealth: Payer: Self-pay | Admitting: Oncology

## 2017-06-10 VITALS — BP 112/80 | HR 106 | Temp 98.4°F | Resp 18 | Ht 73.0 in | Wt 215.0 lb

## 2017-06-10 DIAGNOSIS — E340 Carcinoid syndrome: Secondary | ICD-10-CM

## 2017-06-10 DIAGNOSIS — C7A012 Malignant carcinoid tumor of the ileum: Secondary | ICD-10-CM

## 2017-06-10 DIAGNOSIS — R630 Anorexia: Secondary | ICD-10-CM

## 2017-06-10 DIAGNOSIS — I10 Essential (primary) hypertension: Secondary | ICD-10-CM

## 2017-06-10 DIAGNOSIS — C7B02 Secondary carcinoid tumors of liver: Secondary | ICD-10-CM | POA: Diagnosis not present

## 2017-06-10 DIAGNOSIS — R000 Tachycardia, unspecified: Secondary | ICD-10-CM | POA: Diagnosis not present

## 2017-06-10 DIAGNOSIS — C7A021 Malignant carcinoid tumor of the cecum: Secondary | ICD-10-CM

## 2017-06-10 DIAGNOSIS — R1030 Lower abdominal pain, unspecified: Secondary | ICD-10-CM | POA: Diagnosis not present

## 2017-06-10 DIAGNOSIS — C7A098 Malignant carcinoid tumors of other sites: Secondary | ICD-10-CM | POA: Diagnosis not present

## 2017-06-10 MED ORDER — OCTREOTIDE ACETATE 20 MG IM KIT
20.0000 mg | PACK | Freq: Once | INTRAMUSCULAR | Status: AC
Start: 2017-06-10 — End: 2017-06-10
  Administered 2017-06-10: 20 mg via INTRAMUSCULAR
  Filled 2017-06-10: qty 1

## 2017-06-10 NOTE — Progress Notes (Signed)
[facility] OFFICE PROGRESS NOTE   Diagnosis: Carcinoid tumor  INTERVAL HISTORY:   Tommy Conley returns as scheduled. He continues monthly Sandostatin. He reports recent difficulty with hypertension. The blood pressure regimen has been adjusted by his primary physician. He has noted a decreased appetite and intermittent lower abdominal pain. He has sweats around the neck at night. No dyspnea or chest pain. No diarrhea.  Objective:  Vital signs in last 24 hours:  Blood pressure 112/80, pulse (!) 106, temperature 98.4 F (36.9 C), temperature source Oral, resp. rate 18, height 6\' 1"  (1.854 m), weight 215 lb (97.5 kg), SpO2 100 %.    HEENT: Neck without mass Resp: End inspiratory rhonchi at the posterior bases bilaterally, no respiratory distress Cardio: Regular rate and rhythm, tachycardia, S3 gallop? GI: Fullness in the mid right subcostal region, nontender Vascular: No leg edema    Medications: I have reviewed the patient's current medications.  Assessment/Plan: 1. 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  1. Status post a right colon/ileum resection on 09/29/2011.  2. Wedge biopsy of a right liver lesion on 09/29/2011 confirmed metastatic well-differentiated neuroendocrine carcinoma.  3. CT of the abdomen 08/25/2011 consistent with multiple liver metastases.  4. Elevated preoperative chromogranin A level and a 24-hour urine 5-HIAA level. Stable on repeat 11/16/2011. 5. Restaging CT on 12/28/2011 confirmed slight progression of the metastatic liver lesions. 6. Status post 3 cycles of Xeloda/temozolomide with cycle 3 initiated on 03/12/2012 7. Restaging CT of the abdomen 04/04/2012 revealed slight progression of the metastatic liver lesions and transverse mesocolon adenopathy. 8. Status post Y-90 radioembolization of the right liver on 05/19/2012. 9. Status post Y-90  radioembolization of the left liver on 07/06/2012. 10. Chromogranin A level decreased on 08/29/2012, 24-hour urine 5 HIAA slightly increased on 09/05/2012 11. Restaging CT on 12/05/2012 with a mixed response in the liver 12. Restaging CT 08/11/2013-mild increase in the size of liver lesions. 13. Restaging CT 01/22/2014-stable to slightly improved liver lesions, mild increase in mesenteric adenopathy. Chromogranin A level stable. 14. Chromogranin A level stable 05/01/2014. 15. Chromogranin A higher on 08/02/2014 16. Restaging CT 11/06/2014 with interval enlargement of the enhancing lesions within the left and right hepatic lobe. 17. Initiation of monthly Sandostatin 12/24/2014 18. Status post Y-90 radioembolization of the right liver on 01/03/2015 19. Chromogranin A level stable 02/18/2015 20. Chromogranin A level improved 08/12/2015 21. Restaging CT 09/11/2015 with mild increase in the size of the dominant liver metastasis in the right hepatic lobe. Other smaller liver metastases showed no significant change. Stable mild lymphadenopathy in central small bowel mesentery and right cardiophrenic angle. Stable subcm right middle lobe pulmonary nodule. 22. Monthly Sandostatin continued 23. Gallium-DOTATATE01/25/2018-increased activity of a dominant right liver mass and left liver lesion. Increased tracer accumulation in a mesenteric mass 2. History of diarrhea (3 to 4 times per day), predating surgery for 8 months. Decrease in daily bowel movements coinciding with discontinuation of Nexium. 3. Hypertension. 4. History of gastroesophageal reflux disease. No reflux symptoms since discontinuing Nexium. 5. Rectal and ascending colon polyps. He underwent a colonoscopy on 12/20/2012 with findings of a sessile polyp in the ascending colon and a sessile polyp in the rectum. Pathology showed hyperplastic polyps. Prior right colon resection noted with a normal appearing ileocolonic anastomosis. Small internal  hemorrhoids noted. Repeat colonoscopy in 5 years. 6. History of Iron deficiency anemia 7. 08/12/2016 syncopal episode status post evaluation in the emergency department   Disposition:  Tommy Conley has metastatic carcinoid tumor. His performance status has declined over the past few weeks. This could be related to adjustment of his blood pressure regimen or progression of the carcinoid disease. He is a candidate for Lutathera therapy. He does not wish to consider this at present. The plan is to continue monthly Sandostatin.  He was tachycardic in the examination room today and on physical exam there is a possible S3 gallop. He had an unremarkable echocardiogram last year. I referred him for a repeat echocardiogram to be sure he has not developed carcinoid heart disease.  He plans to follow-up with Dr. Wolfgang Phoenix for management of hypertension. He will return for an office visit and Sandostatin on 07/13/2017.  25 minutes were spent with the patient today. The majority of the time was used for counseling and coordination of care.  Donneta Romberg, MD  06/10/2017  6:14 PM

## 2017-06-10 NOTE — Telephone Encounter (Signed)
Scheduled appt per 6/21 los - per [facility] suppose to be 7/24 not 6/24 - Gave patient AVS and calender per los.

## 2017-06-14 ENCOUNTER — Telehealth: Payer: Self-pay | Admitting: *Deleted

## 2017-06-14 LAB — CHROMOGRANIN A: CHROMOGRAN A: 86 nmol/L — AB (ref 0–5)

## 2017-06-14 NOTE — Telephone Encounter (Signed)
Per Dr Richardson Landry :  Call pt and schedule f /u o v for htn in two weeks

## 2017-06-14 NOTE — Telephone Encounter (Signed)
Left message to return call 

## 2017-06-15 NOTE — Telephone Encounter (Signed)
Spoke with patient and informed him per Dr.Steve Luking- needs office visit within two weeks for a follow up on Hypertension. Patient verbalized understanding.

## 2017-06-16 ENCOUNTER — Ambulatory Visit (HOSPITAL_COMMUNITY)
Admission: RE | Admit: 2017-06-16 | Discharge: 2017-06-16 | Disposition: A | Discharge status: Home / Self Care | Payer: Medicare Other | Source: Ambulatory Visit | Attending: Oncology | Admitting: Oncology

## 2017-06-16 DIAGNOSIS — I358 Other nonrheumatic aortic valve disorders: Secondary | ICD-10-CM | POA: Insufficient documentation

## 2017-06-16 DIAGNOSIS — I119 Hypertensive heart disease without heart failure: Secondary | ICD-10-CM | POA: Insufficient documentation

## 2017-06-16 DIAGNOSIS — I10 Essential (primary) hypertension: Secondary | ICD-10-CM | POA: Diagnosis not present

## 2017-06-16 DIAGNOSIS — Z720 Tobacco use: Secondary | ICD-10-CM | POA: Diagnosis not present

## 2017-06-16 DIAGNOSIS — C7A021 Malignant carcinoid tumor of the cecum: Secondary | ICD-10-CM | POA: Insufficient documentation

## 2017-06-16 LAB — ECHOCARDIOGRAM COMPLETE
AOASC: 39 cm
E/e' ratio: 5.74
EWDT: 90 ms
FS: 28 % (ref 28–44)
IVS/LV PW RATIO, ED: 1.17
LA vol: 47.5 mL
LADIAMINDEX: 1.46 cm/m2
LASIZE: 33 mm
LAVOLA4C: 42.3 mL
LAVOLIN: 21 mL/m2
LEFT ATRIUM END SYS DIAM: 33 mm
LV E/e'average: 5.74
LV PW d: 14.8 mm — AB (ref 0.6–1.1)
LV SIMPSON'S DISK: 60
LV TDI E'LATERAL: 8.1
LV TDI E'MEDIAL: 5.98
LV dias vol index: 36 mL/m2
LV dias vol: 82 mL (ref 62–150)
LV e' LATERAL: 8.1 cm/s
LVEEMED: 5.74
LVOT SV: 54 mL
LVOT VTI: 15.6 cm
LVOT area: 3.46 cm2
LVOT diameter: 21 mm
LVOTPV: 91.6 cm/s
LVSYSVOL: 33 mL (ref 21–61)
LVSYSVOLIN: 15 mL/m2
MV Dec: 90
MVPKAVEL: 66.1 m/s
MVPKEVEL: 46.5 m/s
RV sys press: 54 mmHg
Reg peak vel: 311 cm/s
Stroke v: 49 ml
TR max vel: 311 cm/s

## 2017-06-16 NOTE — Progress Notes (Signed)
  Echocardiogram 2D Echocardiogram has been performed.  Bobbye Charleston 06/16/2017, 11:55 AM

## 2017-06-18 ENCOUNTER — Telehealth: Payer: Self-pay | Admitting: *Deleted

## 2017-06-18 NOTE — Telephone Encounter (Signed)
-----   Message from Ladell Pier, MD sent at 06/17/2017  8:34 PM EDT ----- Please call patient, echo is normal

## 2017-06-28 ENCOUNTER — Ambulatory Visit (INDEPENDENT_AMBULATORY_CARE_PROVIDER_SITE_OTHER): Payer: Medicare Other | Admitting: Family Medicine

## 2017-06-28 ENCOUNTER — Encounter: Payer: Self-pay | Admitting: Family Medicine

## 2017-06-28 VITALS — BP 134/90 | Ht 73.0 in | Wt 217.0 lb

## 2017-06-28 DIAGNOSIS — I10 Essential (primary) hypertension: Secondary | ICD-10-CM | POA: Diagnosis not present

## 2017-06-28 DIAGNOSIS — R000 Tachycardia, unspecified: Secondary | ICD-10-CM | POA: Diagnosis not present

## 2017-06-28 NOTE — Progress Notes (Signed)
Subjective:     Patient ID: Tommy Conley, male   DOB: Mar 14, 1951, 66 y.o.   MRN: [id number]  HPI  Patient arrives for office follow-up in blood pressure. Along with elevated heart rate. Please see oncologists last note. Patient was noted to have elevated heart rate then. Next  Echocardiogram was ordered to rule out damage from carcinoid tumor. Echo overall returned good.  Patient claims compliance with blood pressure medication. Tries watch his diet. Next  Does have spells of flushing and loose stools at times.  Review of Systems No headache, no major weight loss or weight gain, no chest pain no back pain abdominal pain no change in bowel habits complete ROS otherwise negative     Objective:   Physical Exam Alert and oriented, vitals reviewed and stable, NAD ENT-TM's and ext canals WNL bilat via otoscopic exam Soft palate, tonsils and post pharynx WNL via oropharyngeal exam Neck-symmetric, no masses; thyroid nonpalpable and nontender Pulmonary-no tachypnea or accessory muscle use; Clear without wheezes via auscultation Card--no abnrml murmurs, rhythm reg and rate WNL Carotid pulses symmetric, without bruits     Assessment:     Impression tachycardia/hypertension. Likely sinus tachycardia. Aggravated potentially by catecholamine excess secondary to carcinoid tumor. Discussed at length with patient. Blood pressure control decent today. Patient declines addition of further medication. Offered a beta blocker which would bring down heart rate somewhat and also blunt his hypertensive response to any circulating factors secondary to carcinoid. Patient declines further medicine at this time.  Greater than 50% of this 25 minute face to face visit was spent in counseling and discussion and coordination of care regarding the above diagnosis/diagnosies     Plan:

## 2017-07-13 ENCOUNTER — Ambulatory Visit (HOSPITAL_BASED_OUTPATIENT_CLINIC_OR_DEPARTMENT_OTHER): Payer: Medicare Other | Admitting: Nurse Practitioner

## 2017-07-13 ENCOUNTER — Ambulatory Visit (HOSPITAL_BASED_OUTPATIENT_CLINIC_OR_DEPARTMENT_OTHER): Payer: Medicare Other

## 2017-07-13 VITALS — BP 132/82 | HR 93 | Temp 98.2°F | Resp 18

## 2017-07-13 VITALS — BP 128/88 | HR 95 | Temp 98.5°F | Resp 18 | Ht 73.0 in | Wt 217.0 lb

## 2017-07-13 DIAGNOSIS — C7B02 Secondary carcinoid tumors of liver: Secondary | ICD-10-CM | POA: Diagnosis not present

## 2017-07-13 DIAGNOSIS — C7A021 Malignant carcinoid tumor of the cecum: Secondary | ICD-10-CM

## 2017-07-13 DIAGNOSIS — E340 Carcinoid syndrome: Secondary | ICD-10-CM | POA: Diagnosis not present

## 2017-07-13 DIAGNOSIS — C7A012 Malignant carcinoid tumor of the ileum: Secondary | ICD-10-CM

## 2017-07-13 DIAGNOSIS — I10 Essential (primary) hypertension: Secondary | ICD-10-CM | POA: Diagnosis not present

## 2017-07-13 MED ORDER — OCTREOTIDE ACETATE 20 MG IM KIT
20.0000 mg | PACK | Freq: Once | INTRAMUSCULAR | Status: AC
  Administered 2017-07-13: 20 mg via INTRAMUSCULAR
  Filled 2017-07-13: qty 1

## 2017-07-13 NOTE — Progress Notes (Signed)
[name]   Diagnosis:  Carcinoid tumor  INTERVAL HISTORY:   Tommy Conley returns as scheduled. He continues monthly Sandostatin. No change in baseline bowel habits of alternating solid stool and diarrhea. No flushing episodes. No areas of consistent pain. He is feeling better than when he was here last month.  Objective:  Vital signs in last 24 hours:  Blood pressure 128/88, pulse 95, temperature 98.5 F (36.9 C), resp. rate 18, height 6\' 1"  (1.854 m), weight 217 lb (98.4 kg), SpO2 98 %.    HEENT: Neck without mass. Resp: Inspiratory rhonchi left lower lung base. No respiratory distress. Cardio: Regular rate and rhythm. GI: Abdomen soft and nontender. No hepatomegaly. Vascular: No leg edema.    Lab Results:  Lab Results  Component Value Date   WBC 10.0 08/12/2016   HGB 12.8 (L) 08/12/2016   HCT 38.4 (L) 08/12/2016   MCV 83.5 08/12/2016   PLT 175 08/12/2016   NEUTROABS 7.8 (H) 08/12/2016    Imaging:  No results found.  Medications: I have reviewed the patient's current medications.  Assessment/Plan: 1. 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  1. Status post a right colon/ileum resection on 09/29/2011.  2. Wedge biopsy of a right liver lesion on 09/29/2011 confirmed metastatic well-differentiated neuroendocrine carcinoma.  3. CT of the abdomen 08/25/2011 consistent with multiple liver metastases.  4. Elevated preoperative chromogranin A level and a 24-hour urine 5-HIAA level. Stable on repeat 11/16/2011. 5. Restaging CT on 12/28/2011 confirmed slight progression of the metastatic liver lesions. 6. Status post 3 cycles of Xeloda/temozolomide with cycle 3 initiated on 03/12/2012 7. Restaging CT of the abdomen 04/04/2012 revealed slight progression of the metastatic liver lesions and transverse mesocolon adenopathy. 8. Status post Y-90  radioembolization of the right liver on 05/19/2012. 9. Status post Y-90 radioembolization of the left liver on 07/06/2012. 10. Chromogranin A level decreased on 08/29/2012, 24-hour urine 5 HIAA slightly increased on 09/05/2012 11. Restaging CT on 12/05/2012 with a mixed response in the liver 12. Restaging CT 08/11/2013-mild increase in the size of liver lesions. 13. Restaging CT 01/22/2014-stable to slightly improved liver lesions, mild increase in mesenteric adenopathy. Chromogranin A level stable. 14. Chromogranin A level stable 05/01/2014. 15. Chromogranin A higher on 08/02/2014 16. Restaging CT 11/06/2014 with interval enlargement of the enhancing lesions within the left and right hepatic lobe. 17. Initiation of monthly Sandostatin 12/24/2014 18. Status post Y-90 radioembolization of the right liver on 01/03/2015 19. Chromogranin A level stable 02/18/2015 20. Chromogranin A level improved 08/12/2015 21. Restaging CT 09/11/2015 with mild increase in the size of the dominant liver metastasis in the right hepatic lobe. Other smaller liver metastases showed no significant change. Stable mild lymphadenopathy in central small bowel mesentery and right cardiophrenic angle. Stable subcm right middle lobe pulmonary nodule. 22. Monthly Sandostatin continued 23. Gallium-DOTATATE01/25/2018-increased activity of a dominant right liver mass and left liver lesion. Increased tracer accumulation in a mesenteric mass 2. History of diarrhea (3 to 4 times per day), predating surgery for 8 months. Decrease in daily bowel movements coinciding with discontinuation of Nexium. 3. Hypertension. 4. History of gastroesophageal reflux disease. No reflux symptoms since discontinuing Nexium. 5. Rectal and ascending colon polyps. He underwent a colonoscopy on 12/20/2012 with findings of a sessile polyp in the ascending colon and a sessile polyp in the rectum. Pathology showed hyperplastic polyps. Prior right colon resection  noted with a normal appearing ileocolonic anastomosis.  Small internal hemorrhoids noted. Repeat colonoscopy in 5 years. 6. History of Iron deficiency anemia 7. 08/12/2016 syncopal episode status post evaluation in the emergency department    Disposition: Tommy Conley appears stable. His performance status is better as compared to last month. Dr. Benay Spice discussed Lutathera therapy with him at his last visit. We discussed Lutathera again at today's visit. He does not wish to consider at present. He would like to continue monthly Sandostatin. He will receive a Sandostatin injection today. He will return for the next injection in 4 weeks. We will see him in follow-up in 8 weeks. He will contact the office in the interim with any problems.  Plan reviewed with Dr. Benay Spice.    Ned Card ANP/GNP-BC   07/13/2017  12:30 PM

## 2017-07-26 ENCOUNTER — Ambulatory Visit: Payer: Medicare Other | Admitting: Family Medicine

## 2017-08-03 ENCOUNTER — Encounter: Payer: Self-pay | Admitting: Family Medicine

## 2017-08-08 ENCOUNTER — Other Ambulatory Visit: Payer: Self-pay | Admitting: Family Medicine

## 2017-08-10 ENCOUNTER — Ambulatory Visit (HOSPITAL_BASED_OUTPATIENT_CLINIC_OR_DEPARTMENT_OTHER): Payer: Medicare Other

## 2017-08-10 ENCOUNTER — Encounter (INDEPENDENT_AMBULATORY_CARE_PROVIDER_SITE_OTHER): Payer: Self-pay

## 2017-08-10 VITALS — BP 150/90 | HR 99 | Temp 98.0°F | Resp 18

## 2017-08-10 DIAGNOSIS — E340 Carcinoid syndrome: Secondary | ICD-10-CM

## 2017-08-10 DIAGNOSIS — C7B02 Secondary carcinoid tumors of liver: Secondary | ICD-10-CM | POA: Diagnosis not present

## 2017-08-10 DIAGNOSIS — C7A021 Malignant carcinoid tumor of the cecum: Secondary | ICD-10-CM | POA: Diagnosis not present

## 2017-08-10 MED ORDER — OCTREOTIDE ACETATE 20 MG IM KIT
20.0000 mg | PACK | Freq: Once | INTRAMUSCULAR | Status: AC
  Administered 2017-08-10: 20 mg via INTRAMUSCULAR
  Filled 2017-08-10: qty 1

## 2017-08-10 NOTE — Patient Instructions (Signed)
Octreotide injection solution °What is this medicine? °OCTREOTIDE (ok TREE oh tide) is used to reduce blood levels of growth hormone in patients with a condition called acromegaly. This medicine also reduces flushing and watery diarrhea caused by certain types of cancer. °This medicine may be used for other purposes; ask your health care provider or pharmacist if you have questions. °COMMON BRAND NAME(S): Sandostatin, Sandostatin LAR °What should I tell my health care provider before I take this medicine? °They need to know if you have any of these conditions: °-gallbladder disease °-kidney disease °-liver disease °-an unusual or allergic reaction to octreotide, other medicines, foods, dyes, or preservatives °-pregnant or trying to get pregnant °-breast-feeding °How should I use this medicine? °This medicine is for injection under the skin or into a vein (only in emergency situations). It is usually given by a health care professional in a hospital or clinic setting. °If you get this medicine at home, you will be taught how to prepare and give this medicine. Allow the injection solution to come to room temperature before use. Do not warm it artificially. Use exactly as directed. Take your medicine at regular intervals. Do not take your medicine more often than directed. °It is important that you put your used needles and syringes in a special sharps container. Do not put them in a trash can. If you do not have a sharps container, call your pharmacist or healthcare provider to get one. °Talk to your pediatrician regarding the use of this medicine in children. Special care may be needed. °Overdosage: If you think you have taken too much of this medicine contact a poison control center or emergency room at once. °NOTE: This medicine is only for you. Do not share this medicine with others. °What if I miss a dose? °If you miss a dose, take it as soon as you can. If it is almost time for your next dose, take only that  dose. Do not take double or extra doses. °What may interact with this medicine? °Do not take this medicine with any of the following medications: °-cisapride °-droperidol °-general anesthetics °-grepafloxacin °-perphenazine °-thioridazine °This medicine may also interact with the following medications: °-bromocriptine °-cyclosporine °-diuretics °-medicines for blood pressure, heart disease, irregular heart beat °-medicines for diabetes, including insulin °-quinidine °This list may not describe all possible interactions. Give your health care provider a list of all the medicines, herbs, non-prescription drugs, or dietary supplements you use. Also tell them if you smoke, drink alcohol, or use illegal drugs. Some items may interact with your medicine. °What should I watch for while using this medicine? °Visit your doctor or health care professional for regular checks on your progress. °To help reduce irritation at the injection site, use a different site for each injection and make sure the solution is at room temperature before use. °This medicine may cause increases or decreases in blood sugar. Signs of high blood sugar include frequent urination, unusual thirst, flushed or dry skin, difficulty breathing, drowsiness, stomach ache, nausea, vomiting or dry mouth. Signs of low blood sugar include chills, cool, pale skin or cold sweats, drowsiness, extreme hunger, fast heartbeat, headache, nausea, nervousness or anxiety, shakiness, trembling, unsteadiness, tiredness, or weakness. Contact your doctor or health care professional right away if you experience any of these symptoms. °What side effects may I notice from receiving this medicine? °Side effects that you should report to your doctor or health care professional as soon as possible: °-allergic reactions like skin rash, itching or hives, swelling   of the face, lips, or tongue °-changes in blood sugar °-changes in heart rate °-severe stomach pain °Side effects that  usually do not require medical attention (report to your doctor or health care professional if they continue or are bothersome): °-diarrhea or constipation °-gas or stomach pain °-nausea, vomiting °-pain, redness, swelling and irritation at site where injected °This list may not describe all possible side effects. Call your doctor for medical advice about side effects. You may report side effects to FDA at 1-800-FDA-1088. °Where should I keep my medicine? °Keep out of the reach of children. °Store in a refrigerator between 2 and 8 degrees C (36 and 46 degrees F). Protect from light. Allow to come to room temperature naturally. Do not use artificial heat. If protected from light, the injection may be stored at room temperature between 20 and 30 degrees C (70 and 86 degrees F) for 14 days. After the initial use, throw away any unused portion of a multiple dose vial after 14 days. Throw away unused portions of the ampules after use. °NOTE: This sheet is a summary. It may not cover all possible information. If you have questions about this medicine, talk to your doctor, pharmacist, or health care provider. °© 2017 Elsevier/Gold Standard (2008-07-03 16:56:04) ° °

## 2017-08-13 ENCOUNTER — Other Ambulatory Visit: Payer: Self-pay | Admitting: Family Medicine

## 2017-09-06 ENCOUNTER — Other Ambulatory Visit: Payer: Self-pay | Admitting: Family Medicine

## 2017-09-07 ENCOUNTER — Telehealth: Payer: Self-pay | Admitting: Oncology

## 2017-09-07 ENCOUNTER — Ambulatory Visit (HOSPITAL_BASED_OUTPATIENT_CLINIC_OR_DEPARTMENT_OTHER): Payer: Medicare Other | Admitting: Oncology

## 2017-09-07 ENCOUNTER — Ambulatory Visit (HOSPITAL_BASED_OUTPATIENT_CLINIC_OR_DEPARTMENT_OTHER): Payer: Medicare Other

## 2017-09-07 VITALS — BP 136/84 | HR 99 | Temp 98.7°F | Resp 18 | Ht 73.0 in | Wt 218.5 lb

## 2017-09-07 DIAGNOSIS — C7B02 Secondary carcinoid tumors of liver: Secondary | ICD-10-CM | POA: Diagnosis not present

## 2017-09-07 DIAGNOSIS — I10 Essential (primary) hypertension: Secondary | ICD-10-CM | POA: Diagnosis not present

## 2017-09-07 DIAGNOSIS — Z23 Encounter for immunization: Secondary | ICD-10-CM | POA: Diagnosis not present

## 2017-09-07 DIAGNOSIS — C7A021 Malignant carcinoid tumor of the cecum: Secondary | ICD-10-CM

## 2017-09-07 DIAGNOSIS — E340 Carcinoid syndrome: Secondary | ICD-10-CM | POA: Diagnosis not present

## 2017-09-07 DIAGNOSIS — C7A012 Malignant carcinoid tumor of the ileum: Secondary | ICD-10-CM

## 2017-09-07 MED ORDER — OCTREOTIDE ACETATE 20 MG IM KIT
20.0000 mg | PACK | Freq: Once | INTRAMUSCULAR | Status: AC
  Administered 2017-09-07: 20 mg via INTRAMUSCULAR
  Filled 2017-09-07: qty 1

## 2017-09-07 MED ORDER — INFLUENZA VAC SPLIT QUAD 0.5 ML IM SUSY
0.5000 mL | PREFILLED_SYRINGE | Freq: Once | INTRAMUSCULAR | Status: AC
  Administered 2017-09-07: 0.5 mL via INTRAMUSCULAR
  Filled 2017-09-07: qty 0.5

## 2017-09-07 NOTE — Progress Notes (Signed)
[place] OFFICE PROGRESS NOTE   Diagnosis: Carcinoid tumor  INTERVAL HISTORY:   Mr. Bakken returns as scheduled. He continues monthly Sandostatin. He feels well. No fever, diarrhea, or flushing. No complaint.  Objective:  Vital signs in last 24 hours:  Blood pressure 136/84, pulse 99, temperature 98.7 F (37.1 C), temperature source Oral, resp. rate 18, height 6\' 1"  (1.854 m), weight 218 lb 8 oz (99.1 kg), SpO2 98 %.    Resp: Lungs clear bilaterally Cardio: Regular rate and rhythm GI: No hepatosplenomegaly, nontender Vascular: No leg edema   Medications: I have reviewed the patient's current medications.  Assessment/Plan: 1. 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  1. Status post a right colon/ileum resection on 09/29/2011.  2. Wedge biopsy of a right liver lesion on 09/29/2011 confirmed metastatic well-differentiated neuroendocrine carcinoma.  3. CT of the abdomen 08/25/2011 consistent with multiple liver metastases.  4. Elevated preoperative chromogranin A level and a 24-hour urine 5-HIAA level. Stable on repeat 11/16/2011. 5. Restaging CT on 12/28/2011 confirmed slight progression of the metastatic liver lesions. 6. Status post 3 cycles of Xeloda/temozolomide with cycle 3 initiated on 03/12/2012 7. Restaging CT of the abdomen 04/04/2012 revealed slight progression of the metastatic liver lesions and transverse mesocolon adenopathy. 8. Status post Y-90 radioembolization of the right liver on 05/19/2012. 9. Status post Y-90 radioembolization of the left liver on 07/06/2012. 10. Chromogranin A level decreased on 08/29/2012, 24-hour urine 5 HIAA slightly increased on 09/05/2012 11. Restaging CT on 12/05/2012 with a mixed response in the liver 12. Restaging CT 08/11/2013-mild increase in the size of liver lesions. 13. Restaging CT 01/22/2014-stable to slightly improved  liver lesions, mild increase in mesenteric adenopathy. Chromogranin A level stable. 14. Chromogranin A level stable 05/01/2014. 15. Chromogranin A higher on 08/02/2014 16. Restaging CT 11/06/2014 with interval enlargement of the enhancing lesions within the left and right hepatic lobe. 17. Initiation of monthly Sandostatin 12/24/2014 18. Status post Y-90 radioembolization of the right liver on 01/03/2015 19. Chromogranin A level stable 02/18/2015 20. Chromogranin A level improved 08/12/2015 21. Restaging CT 09/11/2015 with mild increase in the size of the dominant liver metastasis in the right hepatic lobe. Other smaller liver metastases showed no significant change. Stable mild lymphadenopathy in central small bowel mesentery and right cardiophrenic angle. Stable subcm right middle lobe pulmonary nodule. 22. Monthly Sandostatin continued 23. Gallium-DOTATATE01/25/2018-increased activity of a dominant right liver mass and left liver lesion. Increased tracer accumulation in a mesenteric mass 2. History of diarrhea (3 to 4 times per day), predating surgery for 8 months. Decrease in daily bowel movements coinciding with discontinuation of Nexium. 3. Hypertension. 4. History of gastroesophageal reflux disease. No reflux symptoms since discontinuing Nexium. 5. Rectal and ascending colon polyps. He underwent a colonoscopy on 12/20/2012 with findings of a sessile polyp in the ascending colon and a sessile polyp in the rectum. Pathology showed hyperplastic polyps. Prior right colon resection noted with a normal appearing ileocolonic anastomosis. Small internal hemorrhoids noted. Repeat colonoscopy in 5 years. 6. History of Iron deficiency anemia 7. 08/12/2016 syncopal episode status post evaluation in the emergency department     Disposition:  Mr. Mcbroom appears unchanged. He will continue monthly Sandostatin. We discussed proceeding with Lutathera. He does not wish to have treatment at  present.  He received an influenza vaccine today. He will contact us if he decides to proceed with the [employer].   An echocardiogram 06/16/2017 did not reveal  evidence of carcinoid heart disease.  15 minutes were spent with the patient today. The majority of the time was used for counseling and coordination of care.  Donneta Romberg, MD  09/07/2017  12:24 PM

## 2017-09-07 NOTE — Telephone Encounter (Signed)
Gave patient avs report and appointments for October thru December  °

## 2017-10-05 ENCOUNTER — Ambulatory Visit (HOSPITAL_BASED_OUTPATIENT_CLINIC_OR_DEPARTMENT_OTHER): Payer: Medicare Other

## 2017-10-05 VITALS — BP 143/95 | HR 96 | Temp 97.8°F | Resp 18

## 2017-10-05 DIAGNOSIS — C7B02 Secondary carcinoid tumors of liver: Secondary | ICD-10-CM

## 2017-10-05 DIAGNOSIS — C7A012 Malignant carcinoid tumor of the ileum: Secondary | ICD-10-CM | POA: Diagnosis present

## 2017-10-05 DIAGNOSIS — C7A021 Malignant carcinoid tumor of the cecum: Secondary | ICD-10-CM

## 2017-10-05 MED ORDER — OCTREOTIDE ACETATE 20 MG IM KIT
20.0000 mg | PACK | Freq: Once | INTRAMUSCULAR | Status: AC
  Administered 2017-10-05: 20 mg via INTRAMUSCULAR
  Filled 2017-10-05: qty 1

## 2017-10-05 NOTE — Patient Instructions (Signed)
Octreotide injection solution °What is this medicine? °OCTREOTIDE (ok TREE oh tide) is used to reduce blood levels of growth hormone in patients with a condition called acromegaly. This medicine also reduces flushing and watery diarrhea caused by certain types of cancer. °This medicine may be used for other purposes; ask your health care provider or pharmacist if you have questions. °COMMON BRAND NAME(S): Sandostatin, Sandostatin LAR °What should I tell my health care provider before I take this medicine? °They need to know if you have any of these conditions: °-gallbladder disease °-kidney disease °-liver disease °-an unusual or allergic reaction to octreotide, other medicines, foods, dyes, or preservatives °-pregnant or trying to get pregnant °-breast-feeding °How should I use this medicine? °This medicine is for injection under the skin or into a vein (only in emergency situations). It is usually given by a health care professional in a hospital or clinic setting. °If you get this medicine at home, you will be taught how to prepare and give this medicine. Allow the injection solution to come to room temperature before use. Do not warm it artificially. Use exactly as directed. Take your medicine at regular intervals. Do not take your medicine more often than directed. °It is important that you put your used needles and syringes in a special sharps container. Do not put them in a trash can. If you do not have a sharps container, call your pharmacist or healthcare provider to get one. °Talk to your pediatrician regarding the use of this medicine in children. Special care may be needed. °Overdosage: If you think you have taken too much of this medicine contact a poison control center or emergency room at once. °NOTE: This medicine is only for you. Do not share this medicine with others. °What if I miss a dose? °If you miss a dose, take it as soon as you can. If it is almost time for your next dose, take only that  dose. Do not take double or extra doses. °What may interact with this medicine? °Do not take this medicine with any of the following medications: °-cisapride °-droperidol °-general anesthetics °-grepafloxacin °-perphenazine °-thioridazine °This medicine may also interact with the following medications: °-bromocriptine °-cyclosporine °-diuretics °-medicines for blood pressure, heart disease, irregular heart beat °-medicines for diabetes, including insulin °-quinidine °This list may not describe all possible interactions. Give your health care provider a list of all the medicines, herbs, non-prescription drugs, or dietary supplements you use. Also tell them if you smoke, drink alcohol, or use illegal drugs. Some items may interact with your medicine. °What should I watch for while using this medicine? °Visit your doctor or health care professional for regular checks on your progress. °To help reduce irritation at the injection site, use a different site for each injection and make sure the solution is at room temperature before use. °This medicine may cause increases or decreases in blood sugar. Signs of high blood sugar include frequent urination, unusual thirst, flushed or dry skin, difficulty breathing, drowsiness, stomach ache, nausea, vomiting or dry mouth. Signs of low blood sugar include chills, cool, pale skin or cold sweats, drowsiness, extreme hunger, fast heartbeat, headache, nausea, nervousness or anxiety, shakiness, trembling, unsteadiness, tiredness, or weakness. Contact your doctor or health care professional right away if you experience any of these symptoms. °What side effects may I notice from receiving this medicine? °Side effects that you should report to your doctor or health care professional as soon as possible: °-allergic reactions like skin rash, itching or hives, swelling   of the face, lips, or tongue °-changes in blood sugar °-changes in heart rate °-severe stomach pain °Side effects that  usually do not require medical attention (report to your doctor or health care professional if they continue or are bothersome): °-diarrhea or constipation °-gas or stomach pain °-nausea, vomiting °-pain, redness, swelling and irritation at site where injected °This list may not describe all possible side effects. Call your doctor for medical advice about side effects. You may report side effects to FDA at 1-800-FDA-1088. °Where should I keep my medicine? °Keep out of the reach of children. °Store in a refrigerator between 2 and 8 degrees C (36 and 46 degrees F). Protect from light. Allow to come to room temperature naturally. Do not use artificial heat. If protected from light, the injection may be stored at room temperature between 20 and 30 degrees C (70 and 86 degrees F) for 14 days. After the initial use, throw away any unused portion of a multiple dose vial after 14 days. Throw away unused portions of the ampules after use. °NOTE: This sheet is a summary. It may not cover all possible information. If you have questions about this medicine, talk to your doctor, pharmacist, or health care provider. °© 2017 Elsevier/Gold Standard (2008-07-03 16:56:04) ° °

## 2017-10-12 ENCOUNTER — Other Ambulatory Visit: Payer: Self-pay | Admitting: Family Medicine

## 2017-11-02 ENCOUNTER — Ambulatory Visit (HOSPITAL_BASED_OUTPATIENT_CLINIC_OR_DEPARTMENT_OTHER): Payer: Medicare Other

## 2017-11-02 VITALS — BP 152/88 | HR 91 | Temp 97.5°F | Resp 20

## 2017-11-02 DIAGNOSIS — C7B02 Secondary carcinoid tumors of liver: Secondary | ICD-10-CM | POA: Diagnosis not present

## 2017-11-02 DIAGNOSIS — C7A021 Malignant carcinoid tumor of the cecum: Secondary | ICD-10-CM

## 2017-11-02 DIAGNOSIS — C7A012 Malignant carcinoid tumor of the ileum: Secondary | ICD-10-CM | POA: Diagnosis not present

## 2017-11-02 MED ORDER — OCTREOTIDE ACETATE 20 MG IM KIT
20.0000 mg | PACK | Freq: Once | INTRAMUSCULAR | Status: AC
  Administered 2017-11-02: 20 mg via INTRAMUSCULAR
  Filled 2017-11-02: qty 1

## 2017-11-02 NOTE — Patient Instructions (Signed)
Octreotide injection solution °What is this medicine? °OCTREOTIDE (ok TREE oh tide) is used to reduce blood levels of growth hormone in patients with a condition called acromegaly. This medicine also reduces flushing and watery diarrhea caused by certain types of cancer. °This medicine may be used for other purposes; ask your health care provider or pharmacist if you have questions. °COMMON BRAND NAME(S): Sandostatin, Sandostatin LAR °What should I tell my health care provider before I take this medicine? °They need to know if you have any of these conditions: °-gallbladder disease °-kidney disease °-liver disease °-an unusual or allergic reaction to octreotide, other medicines, foods, dyes, or preservatives °-pregnant or trying to get pregnant °-breast-feeding °How should I use this medicine? °This medicine is for injection under the skin or into a vein (only in emergency situations). It is usually given by a health care professional in a hospital or clinic setting. °If you get this medicine at home, you will be taught how to prepare and give this medicine. Allow the injection solution to come to room temperature before use. Do not warm it artificially. Use exactly as directed. Take your medicine at regular intervals. Do not take your medicine more often than directed. °It is important that you put your used needles and syringes in a special sharps container. Do not put them in a trash can. If you do not have a sharps container, call your pharmacist or healthcare provider to get one. °Talk to your pediatrician regarding the use of this medicine in children. Special care may be needed. °Overdosage: If you think you have taken too much of this medicine contact a poison control center or emergency room at once. °NOTE: This medicine is only for you. Do not share this medicine with others. °What if I miss a dose? °If you miss a dose, take it as soon as you can. If it is almost time for your next dose, take only that  dose. Do not take double or extra doses. °What may interact with this medicine? °Do not take this medicine with any of the following medications: °-cisapride °-droperidol °-general anesthetics °-grepafloxacin °-perphenazine °-thioridazine °This medicine may also interact with the following medications: °-bromocriptine °-cyclosporine °-diuretics °-medicines for blood pressure, heart disease, irregular heart beat °-medicines for diabetes, including insulin °-quinidine °This list may not describe all possible interactions. Give your health care provider a list of all the medicines, herbs, non-prescription drugs, or dietary supplements you use. Also tell them if you smoke, drink alcohol, or use illegal drugs. Some items may interact with your medicine. °What should I watch for while using this medicine? °Visit your doctor or health care professional for regular checks on your progress. °To help reduce irritation at the injection site, use a different site for each injection and make sure the solution is at room temperature before use. °This medicine may cause increases or decreases in blood sugar. Signs of high blood sugar include frequent urination, unusual thirst, flushed or dry skin, difficulty breathing, drowsiness, stomach ache, nausea, vomiting or dry mouth. Signs of low blood sugar include chills, cool, pale skin or cold sweats, drowsiness, extreme hunger, fast heartbeat, headache, nausea, nervousness or anxiety, shakiness, trembling, unsteadiness, tiredness, or weakness. Contact your doctor or health care professional right away if you experience any of these symptoms. °What side effects may I notice from receiving this medicine? °Side effects that you should report to your doctor or health care professional as soon as possible: °-allergic reactions like skin rash, itching or hives, swelling   of the face, lips, or tongue °-changes in blood sugar °-changes in heart rate °-severe stomach pain °Side effects that  usually do not require medical attention (report to your doctor or health care professional if they continue or are bothersome): °-diarrhea or constipation °-gas or stomach pain °-nausea, vomiting °-pain, redness, swelling and irritation at site where injected °This list may not describe all possible side effects. Call your doctor for medical advice about side effects. You may report side effects to FDA at 1-800-FDA-1088. °Where should I keep my medicine? °Keep out of the reach of children. °Store in a refrigerator between 2 and 8 degrees C (36 and 46 degrees F). Protect from light. Allow to come to room temperature naturally. Do not use artificial heat. If protected from light, the injection may be stored at room temperature between 20 and 30 degrees C (70 and 86 degrees F) for 14 days. After the initial use, throw away any unused portion of a multiple dose vial after 14 days. Throw away unused portions of the ampules after use. °NOTE: This sheet is a summary. It may not cover all possible information. If you have questions about this medicine, talk to your doctor, pharmacist, or health care provider. °© 2017 Elsevier/Gold Standard (2008-07-03 16:56:04) ° °

## 2017-11-03 ENCOUNTER — Other Ambulatory Visit: Payer: Self-pay | Admitting: Family Medicine

## 2017-11-09 ENCOUNTER — Other Ambulatory Visit: Payer: Self-pay | Admitting: Family Medicine

## 2017-11-10 NOTE — Telephone Encounter (Signed)
This is Dr. Steve's patient 

## 2017-11-10 NOTE — Telephone Encounter (Signed)
Please advise 

## 2017-11-30 ENCOUNTER — Ambulatory Visit: Payer: Medicare Other | Admitting: Nurse Practitioner

## 2017-11-30 ENCOUNTER — Ambulatory Visit: Payer: Medicare Other

## 2017-11-30 ENCOUNTER — Other Ambulatory Visit: Payer: Self-pay | Admitting: Family Medicine

## 2017-12-01 ENCOUNTER — Telehealth: Payer: Self-pay | Admitting: *Deleted

## 2017-12-01 NOTE — Telephone Encounter (Signed)
Late entry for 11/30/17: pt left message requesting to reschedule 12/18 office visit due to weather conditions. Message to schedulers.

## 2017-12-03 ENCOUNTER — Other Ambulatory Visit: Payer: Self-pay | Admitting: Family Medicine

## 2017-12-03 ENCOUNTER — Telehealth: Payer: Self-pay | Admitting: Oncology

## 2017-12-03 NOTE — Telephone Encounter (Signed)
Spoke to patient regarding upcoming December appointments per 12/12 sch message.  °

## 2017-12-07 ENCOUNTER — Encounter: Payer: Self-pay | Admitting: Nurse Practitioner

## 2017-12-07 ENCOUNTER — Ambulatory Visit (HOSPITAL_BASED_OUTPATIENT_CLINIC_OR_DEPARTMENT_OTHER): Payer: Medicare Other | Admitting: Nurse Practitioner

## 2017-12-07 ENCOUNTER — Ambulatory Visit (HOSPITAL_BASED_OUTPATIENT_CLINIC_OR_DEPARTMENT_OTHER): Payer: Medicare Other

## 2017-12-07 VITALS — BP 140/79 | HR 88 | Temp 98.5°F | Resp 18 | Ht 72.0 in | Wt 219.6 lb

## 2017-12-07 DIAGNOSIS — C7A012 Malignant carcinoid tumor of the ileum: Secondary | ICD-10-CM

## 2017-12-07 DIAGNOSIS — I10 Essential (primary) hypertension: Secondary | ICD-10-CM | POA: Diagnosis not present

## 2017-12-07 DIAGNOSIS — C7A021 Malignant carcinoid tumor of the cecum: Secondary | ICD-10-CM

## 2017-12-07 DIAGNOSIS — C7B02 Secondary carcinoid tumors of liver: Secondary | ICD-10-CM

## 2017-12-07 MED ORDER — OCTREOTIDE ACETATE 20 MG IM KIT
PACK | INTRAMUSCULAR | Status: AC
  Filled 2017-12-07: qty 1

## 2017-12-07 MED ORDER — OCTREOTIDE ACETATE 20 MG IM KIT
20.0000 mg | PACK | Freq: Once | INTRAMUSCULAR | Status: AC
  Administered 2017-12-07: 20 mg via INTRAMUSCULAR

## 2017-12-07 NOTE — Progress Notes (Signed)
[place] OFFICE PROGRESS NOTE   Diagnosis: Carcinoid tumor  INTERVAL HISTORY:   Tommy Conley returns as scheduled.  He continues monthly Sandostatin.  Has occasional diarrhea.  No flushing.  No significant abdominal pain.  Stable appetite.  Objective:  Vital signs in last 24 hours:  Blood pressure 140/79, pulse 88, temperature 98.5 F (36.9 C), temperature source Oral, resp. rate 18, height 6' (1.829 m), weight 219 lb 9.6 oz (99.6 kg), SpO2 99 %.    HEENT: No thrush or ulcers. Resp: Lungs clear bilaterally. Cardio: Regular rate and rhythm. GI: Abdomen soft and nontender.  No hepatomegaly.  No mass. Vascular: No leg edema.   Lab Results:  Lab Results  Component Value Date   WBC 10.0 08/12/2016   HGB 12.8 (L) 08/12/2016   HCT 38.4 (L) 08/12/2016   MCV 83.5 08/12/2016   PLT 175 08/12/2016   NEUTROABS 7.8 (H) 08/12/2016    Imaging:  No results found.  Medications: I have reviewed the patient's current medications.  Assessment/Plan: 1. 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  1. Status post a right colon/ileum resection on 09/29/2011.  2. Wedge biopsy of a right liver lesion on 09/29/2011 confirmed metastatic well-differentiated neuroendocrine carcinoma.  3. CT of the abdomen 08/25/2011 consistent with multiple liver metastases.  4. Elevated preoperative chromogranin A level and a 24-hour urine 5-HIAA level. Stable on repeat 11/16/2011. 5. Restaging CT on 12/28/2011 confirmed slight progression of the metastatic liver lesions. 6. Status post 3 cycles of Xeloda/temozolomide with cycle 3 initiated on 03/12/2012 7. Restaging CT of the abdomen 04/04/2012 revealed slight progression of the metastatic liver lesions and transverse mesocolon adenopathy. 8. Status post Y-90 radioembolization of the right liver on 05/19/2012. 9. Status post Y-90 radioembolization of the left  liver on 07/06/2012. 10. Chromogranin A level decreased on 08/29/2012, 24-hour urine 5 HIAA slightly increased on 09/05/2012 11. Restaging CT on 12/05/2012 with a mixed response in the liver 12. Restaging CT 08/11/2013-mild increase in the size of liver lesions. 13. Restaging CT 01/22/2014-stable to slightly improved liver lesions, mild increase in mesenteric adenopathy. Chromogranin A level stable. 14. Chromogranin A level stable 05/01/2014. 15. Chromogranin A higher on 08/02/2014 16. Restaging CT 11/06/2014 with interval enlargement of the enhancing lesions within the left and right hepatic lobe. 17. Initiation of monthly Sandostatin 12/24/2014 18. Status post Y-90 radioembolization of the right liver on 01/03/2015 19. Chromogranin A level stable 02/18/2015 20. Chromogranin A level improved 08/12/2015 21. Restaging CT 09/11/2015 with mild increase in the size of the dominant liver metastasis in the right hepatic lobe. Other smaller liver metastases showed no significant change. Stable mild lymphadenopathy in central small bowel mesentery and right cardiophrenic angle. Stable subcm right middle lobe pulmonary nodule. 22. Monthly Sandostatin continued 23. Gallium-DOTATATE01/25/2018-increased activity of a dominant right liver mass and left liver lesion. Increased tracer accumulation in a mesenteric mass 2. History of diarrhea (3 to 4 times per day), predating surgery for 8 months. Decrease in daily bowel movements coinciding with discontinuation of Nexium. 3. Hypertension. 4. History of gastroesophageal reflux disease. No reflux symptoms since discontinuing Nexium. 5. Rectal and ascending colon polyps. He underwent a colonoscopy on 12/20/2012 with findings of a sessile polyp in the ascending colon and a sessile polyp in the rectum. Pathology showed hyperplastic polyps. Prior right colon resection noted with a normal appearing ileocolonic anastomosis. Small internal hemorrhoids noted. Repeat  colonoscopy in 5 years. 6. History of Iron deficiency  anemia 7. 08/12/2016 syncopal episode status post evaluation in the emergency department    Disposition: Tommy Conley appears unchanged.  He is interested in proceeding with Lutathera.  We will make a referral.  For now he will continue monthly Sandostatin.  He will return for a follow-up visit in 3 months.  Plan reviewed with Dr. Benay Spice.    Tommy Conley ANP/GNP-BC   12/07/2017  2:46 PM

## 2017-12-07 NOTE — Patient Instructions (Signed)
Octreotide injection solution °What is this medicine? °OCTREOTIDE (ok TREE oh tide) is used to reduce blood levels of growth hormone in patients with a condition called acromegaly. This medicine also reduces flushing and watery diarrhea caused by certain types of cancer. °This medicine may be used for other purposes; ask your health care provider or pharmacist if you have questions. °COMMON BRAND NAME(S): Sandostatin, Sandostatin LAR °What should I tell my health care provider before I take this medicine? °They need to know if you have any of these conditions: °-gallbladder disease °-kidney disease °-liver disease °-an unusual or allergic reaction to octreotide, other medicines, foods, dyes, or preservatives °-pregnant or trying to get pregnant °-breast-feeding °How should I use this medicine? °This medicine is for injection under the skin or into a vein (only in emergency situations). It is usually given by a health care professional in a hospital or clinic setting. °If you get this medicine at home, you will be taught how to prepare and give this medicine. Allow the injection solution to come to room temperature before use. Do not warm it artificially. Use exactly as directed. Take your medicine at regular intervals. Do not take your medicine more often than directed. °It is important that you put your used needles and syringes in a special sharps container. Do not put them in a trash can. If you do not have a sharps container, call your pharmacist or healthcare provider to get one. °Talk to your pediatrician regarding the use of this medicine in children. Special care may be needed. °Overdosage: If you think you have taken too much of this medicine contact a poison control center or emergency room at once. °NOTE: This medicine is only for you. Do not share this medicine with others. °What if I miss a dose? °If you miss a dose, take it as soon as you can. If it is almost time for your next dose, take only that  dose. Do not take double or extra doses. °What may interact with this medicine? °Do not take this medicine with any of the following medications: °-cisapride °-droperidol °-general anesthetics °-grepafloxacin °-perphenazine °-thioridazine °This medicine may also interact with the following medications: °-bromocriptine °-cyclosporine °-diuretics °-medicines for blood pressure, heart disease, irregular heart beat °-medicines for diabetes, including insulin °-quinidine °This list may not describe all possible interactions. Give your health care provider a list of all the medicines, herbs, non-prescription drugs, or dietary supplements you use. Also tell them if you smoke, drink alcohol, or use illegal drugs. Some items may interact with your medicine. °What should I watch for while using this medicine? °Visit your doctor or health care professional for regular checks on your progress. °To help reduce irritation at the injection site, use a different site for each injection and make sure the solution is at room temperature before use. °This medicine may cause increases or decreases in blood sugar. Signs of high blood sugar include frequent urination, unusual thirst, flushed or dry skin, difficulty breathing, drowsiness, stomach ache, nausea, vomiting or dry mouth. Signs of low blood sugar include chills, cool, pale skin or cold sweats, drowsiness, extreme hunger, fast heartbeat, headache, nausea, nervousness or anxiety, shakiness, trembling, unsteadiness, tiredness, or weakness. Contact your doctor or health care professional right away if you experience any of these symptoms. °What side effects may I notice from receiving this medicine? °Side effects that you should report to your doctor or health care professional as soon as possible: °-allergic reactions like skin rash, itching or hives, swelling   of the face, lips, or tongue °-changes in blood sugar °-changes in heart rate °-severe stomach pain °Side effects that  usually do not require medical attention (report to your doctor or health care professional if they continue or are bothersome): °-diarrhea or constipation °-gas or stomach pain °-nausea, vomiting °-pain, redness, swelling and irritation at site where injected °This list may not describe all possible side effects. Call your doctor for medical advice about side effects. You may report side effects to FDA at 1-800-FDA-1088. °Where should I keep my medicine? °Keep out of the reach of children. °Store in a refrigerator between 2 and 8 degrees C (36 and 46 degrees F). Protect from light. Allow to come to room temperature naturally. Do not use artificial heat. If protected from light, the injection may be stored at room temperature between 20 and 30 degrees C (70 and 86 degrees F) for 14 days. After the initial use, throw away any unused portion of a multiple dose vial after 14 days. Throw away unused portions of the ampules after use. °NOTE: This sheet is a summary. It may not cover all possible information. If you have questions about this medicine, talk to your doctor, pharmacist, or health care provider. °© 2017 Elsevier/Gold Standard (2008-07-03 16:56:04) ° °

## 2017-12-09 ENCOUNTER — Telehealth: Payer: Self-pay | Admitting: Oncology

## 2017-12-09 NOTE — Telephone Encounter (Signed)
Left message for patient regarding upcoming January through March appointments.

## 2017-12-17 ENCOUNTER — Encounter: Payer: Self-pay | Admitting: Gastroenterology

## 2018-01-03 ENCOUNTER — Other Ambulatory Visit: Payer: Self-pay | Admitting: Family Medicine

## 2018-01-04 ENCOUNTER — Inpatient Hospital Stay: Payer: Medicare Other | Attending: Oncology

## 2018-01-04 VITALS — BP 144/90 | HR 88 | Temp 98.0°F | Resp 18

## 2018-01-04 DIAGNOSIS — Z79899 Other long term (current) drug therapy: Secondary | ICD-10-CM | POA: Diagnosis not present

## 2018-01-04 DIAGNOSIS — C7A021 Malignant carcinoid tumor of the cecum: Secondary | ICD-10-CM | POA: Insufficient documentation

## 2018-01-04 MED ORDER — OCTREOTIDE ACETATE 20 MG IM KIT
20.0000 mg | PACK | Freq: Once | INTRAMUSCULAR | Status: AC
  Administered 2018-01-04: 20 mg via INTRAMUSCULAR

## 2018-01-04 MED ORDER — OCTREOTIDE ACETATE 20 MG IM KIT
PACK | INTRAMUSCULAR | Status: AC
  Filled 2018-01-04: qty 1

## 2018-01-04 NOTE — Patient Instructions (Signed)
Octreotide injection solution °What is this medicine? °OCTREOTIDE (ok TREE oh tide) is used to reduce blood levels of growth hormone in patients with a condition called acromegaly. This medicine also reduces flushing and watery diarrhea caused by certain types of cancer. °This medicine may be used for other purposes; ask your health care provider or pharmacist if you have questions. °COMMON BRAND NAME(S): Sandostatin, Sandostatin LAR °What should I tell my health care provider before I take this medicine? °They need to know if you have any of these conditions: °-gallbladder disease °-kidney disease °-liver disease °-an unusual or allergic reaction to octreotide, other medicines, foods, dyes, or preservatives °-pregnant or trying to get pregnant °-breast-feeding °How should I use this medicine? °This medicine is for injection under the skin or into a vein (only in emergency situations). It is usually given by a health care professional in a hospital or clinic setting. °If you get this medicine at home, you will be taught how to prepare and give this medicine. Allow the injection solution to come to room temperature before use. Do not warm it artificially. Use exactly as directed. Take your medicine at regular intervals. Do not take your medicine more often than directed. °It is important that you put your used needles and syringes in a special sharps container. Do not put them in a trash can. If you do not have a sharps container, call your pharmacist or healthcare provider to get one. °Talk to your pediatrician regarding the use of this medicine in children. Special care may be needed. °Overdosage: If you think you have taken too much of this medicine contact a poison control center or emergency room at once. °NOTE: This medicine is only for you. Do not share this medicine with others. °What if I miss a dose? °If you miss a dose, take it as soon as you can. If it is almost time for your next dose, take only that  dose. Do not take double or extra doses. °What may interact with this medicine? °Do not take this medicine with any of the following medications: °-cisapride °-droperidol °-general anesthetics °-grepafloxacin °-perphenazine °-thioridazine °This medicine may also interact with the following medications: °-bromocriptine °-cyclosporine °-diuretics °-medicines for blood pressure, heart disease, irregular heart beat °-medicines for diabetes, including insulin °-quinidine °This list may not describe all possible interactions. Give your health care provider a list of all the medicines, herbs, non-prescription drugs, or dietary supplements you use. Also tell them if you smoke, drink alcohol, or use illegal drugs. Some items may interact with your medicine. °What should I watch for while using this medicine? °Visit your doctor or health care professional for regular checks on your progress. °To help reduce irritation at the injection site, use a different site for each injection and make sure the solution is at room temperature before use. °This medicine may cause increases or decreases in blood sugar. Signs of high blood sugar include frequent urination, unusual thirst, flushed or dry skin, difficulty breathing, drowsiness, stomach ache, nausea, vomiting or dry mouth. Signs of low blood sugar include chills, cool, pale skin or cold sweats, drowsiness, extreme hunger, fast heartbeat, headache, nausea, nervousness or anxiety, shakiness, trembling, unsteadiness, tiredness, or weakness. Contact your doctor or health care professional right away if you experience any of these symptoms. °What side effects may I notice from receiving this medicine? °Side effects that you should report to your doctor or health care professional as soon as possible: °-allergic reactions like skin rash, itching or hives, swelling   of the face, lips, or tongue °-changes in blood sugar °-changes in heart rate °-severe stomach pain °Side effects that  usually do not require medical attention (report to your doctor or health care professional if they continue or are bothersome): °-diarrhea or constipation °-gas or stomach pain °-nausea, vomiting °-pain, redness, swelling and irritation at site where injected °This list may not describe all possible side effects. Call your doctor for medical advice about side effects. You may report side effects to FDA at 1-800-FDA-1088. °Where should I keep my medicine? °Keep out of the reach of children. °Store in a refrigerator between 2 and 8 degrees C (36 and 46 degrees F). Protect from light. Allow to come to room temperature naturally. Do not use artificial heat. If protected from light, the injection may be stored at room temperature between 20 and 30 degrees C (70 and 86 degrees F) for 14 days. After the initial use, throw away any unused portion of a multiple dose vial after 14 days. Throw away unused portions of the ampules after use. °NOTE: This sheet is a summary. It may not cover all possible information. If you have questions about this medicine, talk to your doctor, pharmacist, or health care provider. °© 2017 Elsevier/Gold Standard (2008-07-03 16:56:04) ° °

## 2018-01-07 ENCOUNTER — Other Ambulatory Visit: Payer: Self-pay | Admitting: Family Medicine

## 2018-01-25 ENCOUNTER — Encounter: Payer: Self-pay | Admitting: Gastroenterology

## 2018-01-26 ENCOUNTER — Other Ambulatory Visit: Payer: Self-pay | Admitting: Nurse Practitioner

## 2018-01-26 DIAGNOSIS — C7A021 Malignant carcinoid tumor of the cecum: Secondary | ICD-10-CM

## 2018-01-27 ENCOUNTER — Other Ambulatory Visit: Payer: Self-pay | Admitting: *Deleted

## 2018-01-27 DIAGNOSIS — C7A021 Malignant carcinoid tumor of the cecum: Secondary | ICD-10-CM

## 2018-02-01 ENCOUNTER — Inpatient Hospital Stay: Payer: Medicare Other | Attending: Oncology

## 2018-02-01 VITALS — BP 143/90 | HR 90 | Temp 98.2°F | Resp 20

## 2018-02-01 DIAGNOSIS — C7A021 Malignant carcinoid tumor of the cecum: Secondary | ICD-10-CM | POA: Insufficient documentation

## 2018-02-01 DIAGNOSIS — Z79899 Other long term (current) drug therapy: Secondary | ICD-10-CM | POA: Insufficient documentation

## 2018-02-01 MED ORDER — OCTREOTIDE ACETATE 20 MG IM KIT
PACK | INTRAMUSCULAR | Status: AC
  Filled 2018-02-01: qty 1

## 2018-02-01 MED ORDER — OCTREOTIDE ACETATE 20 MG IM KIT
20.0000 mg | PACK | Freq: Once | INTRAMUSCULAR | Status: AC
  Administered 2018-02-01: 20 mg via INTRAMUSCULAR

## 2018-02-01 NOTE — Patient Instructions (Signed)
Octreotide injection solution °What is this medicine? °OCTREOTIDE (ok TREE oh tide) is used to reduce blood levels of growth hormone in patients with a condition called acromegaly. This medicine also reduces flushing and watery diarrhea caused by certain types of cancer. °This medicine may be used for other purposes; ask your health care provider or pharmacist if you have questions. °COMMON BRAND NAME(S): Sandostatin, Sandostatin LAR °What should I tell my health care provider before I take this medicine? °They need to know if you have any of these conditions: °-gallbladder disease °-kidney disease °-liver disease °-an unusual or allergic reaction to octreotide, other medicines, foods, dyes, or preservatives °-pregnant or trying to get pregnant °-breast-feeding °How should I use this medicine? °This medicine is for injection under the skin or into a vein (only in emergency situations). It is usually given by a health care professional in a hospital or clinic setting. °If you get this medicine at home, you will be taught how to prepare and give this medicine. Allow the injection solution to come to room temperature before use. Do not warm it artificially. Use exactly as directed. Take your medicine at regular intervals. Do not take your medicine more often than directed. °It is important that you put your used needles and syringes in a special sharps container. Do not put them in a trash can. If you do not have a sharps container, call your pharmacist or healthcare provider to get one. °Talk to your pediatrician regarding the use of this medicine in children. Special care may be needed. °Overdosage: If you think you have taken too much of this medicine contact a poison control center or emergency room at once. °NOTE: This medicine is only for you. Do not share this medicine with others. °What if I miss a dose? °If you miss a dose, take it as soon as you can. If it is almost time for your next dose, take only that  dose. Do not take double or extra doses. °What may interact with this medicine? °Do not take this medicine with any of the following medications: °-cisapride °-droperidol °-general anesthetics °-grepafloxacin °-perphenazine °-thioridazine °This medicine may also interact with the following medications: °-bromocriptine °-cyclosporine °-diuretics °-medicines for blood pressure, heart disease, irregular heart beat °-medicines for diabetes, including insulin °-quinidine °This list may not describe all possible interactions. Give your health care provider a list of all the medicines, herbs, non-prescription drugs, or dietary supplements you use. Also tell them if you smoke, drink alcohol, or use illegal drugs. Some items may interact with your medicine. °What should I watch for while using this medicine? °Visit your doctor or health care professional for regular checks on your progress. °To help reduce irritation at the injection site, use a different site for each injection and make sure the solution is at room temperature before use. °This medicine may cause increases or decreases in blood sugar. Signs of high blood sugar include frequent urination, unusual thirst, flushed or dry skin, difficulty breathing, drowsiness, stomach ache, nausea, vomiting or dry mouth. Signs of low blood sugar include chills, cool, pale skin or cold sweats, drowsiness, extreme hunger, fast heartbeat, headache, nausea, nervousness or anxiety, shakiness, trembling, unsteadiness, tiredness, or weakness. Contact your doctor or health care professional right away if you experience any of these symptoms. °What side effects may I notice from receiving this medicine? °Side effects that you should report to your doctor or health care professional as soon as possible: °-allergic reactions like skin rash, itching or hives, swelling   of the face, lips, or tongue °-changes in blood sugar °-changes in heart rate °-severe stomach pain °Side effects that  usually do not require medical attention (report to your doctor or health care professional if they continue or are bothersome): °-diarrhea or constipation °-gas or stomach pain °-nausea, vomiting °-pain, redness, swelling and irritation at site where injected °This list may not describe all possible side effects. Call your doctor for medical advice about side effects. You may report side effects to FDA at 1-800-FDA-1088. °Where should I keep my medicine? °Keep out of the reach of children. °Store in a refrigerator between 2 and 8 degrees C (36 and 46 degrees F). Protect from light. Allow to come to room temperature naturally. Do not use artificial heat. If protected from light, the injection may be stored at room temperature between 20 and 30 degrees C (70 and 86 degrees F) for 14 days. After the initial use, throw away any unused portion of a multiple dose vial after 14 days. Throw away unused portions of the ampules after use. °NOTE: This sheet is a summary. It may not cover all possible information. If you have questions about this medicine, talk to your doctor, pharmacist, or health care provider. °© 2017 Elsevier/Gold Standard (2008-07-03 16:56:04) ° °

## 2018-02-04 ENCOUNTER — Other Ambulatory Visit: Payer: Self-pay | Admitting: Nurse Practitioner

## 2018-02-04 ENCOUNTER — Encounter (HOSPITAL_COMMUNITY)
Admission: RE | Admit: 2018-02-04 | Discharge: 2018-02-04 | Disposition: A | Discharge status: Home / Self Care | Payer: Medicare Other | Source: Ambulatory Visit | Attending: Nurse Practitioner | Admitting: Nurse Practitioner

## 2018-02-04 DIAGNOSIS — C7A021 Malignant carcinoid tumor of the cecum: Secondary | ICD-10-CM | POA: Insufficient documentation

## 2018-02-04 DIAGNOSIS — C7A1 Malignant poorly differentiated neuroendocrine tumors: Secondary | ICD-10-CM | POA: Diagnosis not present

## 2018-02-04 NOTE — Consult Note (Signed)
Chief Complaint: Patient with metastatic neuroendocrine tumor was for  evaluation Peptide receptor radiotherapy (PRRT) with FI433 DOTATATE (Lutathera).  Referring Physician(s): Dr. Benay Spice    Patient Status: [place]  History of Present Illness: RAYNALD ROUILLARD 67 y.o. male :  Well differentiated neuroendocrine tumor of the distal ileum and colon with resection October 2012.   Partial resection of the RIGHT hepatic lobe demonstrated metastatic well-differentiated neuroendocrine.   Yttrium 90 radio embolization of the RIGHT and LEFT hepatic lobe in July 2013.   Progression of disease while on maintenance monthly Sandostatin injections.   Carcinoid type symptoms include intermittent diarrhea.  No flushing.  No hypotension.    Past Medical History:  Diagnosis Date  . Barrett's esophagus   . Cancer ([place])    colon ca with liver lesions  . Carcinoid tumor of cecum   . Colon polyp   . GERD (gastroesophageal reflux disease)   . Hypertension   . IBS (irritable bowel syndrome)   . Malignant carcinoid tumors of other sites ([place]) 08/25/2016  . Perennial allergic rhinitis     Past Surgical History:  Procedure Laterality Date  . colectomy  09/29/11   lap - right with liver bx  . COLON SURGERY    . COLONOSCOPY  07/2011  . UPPER GASTROINTESTINAL ENDOSCOPY     hx barrett's esophagus  . WEDGE LIVER BIOPSY  09/29/11   right liver lesion    Allergies: Vasotec [enalapril]  Medications: Prior to Admission medications   Medication Sig Start Date End Date Taking? Authorizing Provider  amLODipine (NORVASC) 10 MG tablet TAKE 1 TABLET(10 MG) BY MOUTH DAILY 01/03/18   Mikey Kirschner, MD  dicyclomine (BENTYL) 20 MG tablet TAKE 1 TABLET BY MOUTH THREE TIMES DAILY AS NEEDED FOR ABDOMINAL CRAMPS 04/08/17   Mikey Kirschner, MD  hydrALAZINE (APRESOLINE) 50 MG tablet TAKE 1 TABLET BY MOUTH THREE TIMES DAILY 12/03/17   Nilda Simmer, NP  hydrALAZINE (APRESOLINE) 50 MG  tablet TAKE 1 TABLET BY MOUTH THREE TIMES DAILY 01/07/18   Mikey Kirschner, MD  hydrochlorothiazide (HYDRODIURIL) 25 MG tablet TAKE 1/2 TABLET BY MOUTH IN THE MORNING 08/13/17   Mikey Kirschner, MD  hyoscyamine (LEVBID) 0.375 MG 12 hr tablet Take 1 tablet (0.375 mg total) by mouth 2 (two) times daily. 04/29/16   Mikey Kirschner, MD  losartan (COZAAR) 100 MG tablet TAKE 1 TABLET (100 MG TOTAL) BY MOUTH AT BEDTIME. 01/25/17   Mikey Kirschner, MD  losartan (COZAAR) 100 MG tablet TAKE 1 TABLET(100 MG) BY MOUTH AT BEDTIME 01/07/18   Mikey Kirschner, MD  potassium chloride SA (K-DUR,KLOR-CON) 20 MEQ tablet TAKE 1 TABLET(20 MEQ) BY MOUTH DAILY 08/13/17   Mikey Kirschner, MD  pramoxine-hydrocortisone (PROCTOCREAM-HC) 1-1 % rectal cream Place 1 application rectally 3 (three) times daily. 04/29/16   Mikey Kirschner, MD  PROCTOSOL HC 2.5 % rectal cream PLACE 1 APPLICATION RECTALLY 3 (THREE) TIMES DAILY. 11/10/17   Mikey Kirschner, MD  sildenafil (VIAGRA) 100 MG tablet Take 1 tablet (100 mg total) by mouth daily as needed for erectile dysfunction. Patient not taking: Reported on 07/13/2017 09/19/14   Mikey Kirschner, MD  tamsulosin (FLOMAX) 0.4 MG CAPS capsule TAKE 1 CAPSULE(0.4 MG) BY MOUTH AT BEDTIME 11/30/17   Kathyrn Drown, MD     Family History  Problem Relation Age of Onset  . Hypertension Father   . Hyperlipidemia Father   . Heart attack Father   .  Colon polyps Neg Hx   . Colon cancer Neg Hx   . Stomach cancer Neg Hx   . Cancer Neg Hx     Social History   Socioeconomic History  . Marital status: Legally Separated    Spouse name: Not on file  . Number of children: Not on file  . Years of education: Not on file  . Highest education level: Not on file  Social Needs  . Financial resource strain: Not on file  . Food insecurity - worry: Not on file  . Food insecurity - inability: Not on file  . Transportation needs - medical: Not on file  . Transportation needs - non-medical: Not  on file  Occupational History  . Not on file  Tobacco Use  . Smoking status: Light Tobacco Smoker    Packs/day: 0.25    Years: 17.00    Pack years: 4.25    Types: Cigarettes  . Smokeless tobacco: Never Used  . Tobacco comment: does not smoke every day  Substance and Sexual Activity  . Alcohol use: No  . Drug use: No  . Sexual activity: Not on file  Other Topics Concern  . Not on file  Social History Narrative  . Not on file    ECOG Status: 1 - Symptomatic but completely ambulatory  Review of Systems: A 12 point ROS discussed and pertinent positives are indicated in the HPI above.  All other systems are negative.  Review of Systems  Constitutional: Positive for activity change and fatigue.  HENT: Negative.   Eyes: Negative.   Respiratory: Negative.   Cardiovascular: Negative.   Gastrointestinal: Positive for diarrhea.  Genitourinary: Positive for flank pain.  Psychiatric/Behavioral: Negative.   All other systems reviewed and are negative.   Vital Signs: There were no vitals taken for this visit.  Physical Exam  Constitutional: He is oriented to person, place, and time. He appears well-developed and well-nourished.  HENT:  Head: Normocephalic.  Mouth/Throat: Oropharynx is clear and moist.  Eyes: EOM are normal.  Neck: Normal range of motion.  Cardiovascular: Normal rate and regular rhythm.  Pulmonary/Chest: Effort normal and breath sounds normal.  Abdominal: Soft. Bowel sounds are normal.  Lymphadenopathy:    He has no cervical adenopathy.  Neurological: He is alert and oriented to person, place, and time.  Skin: Skin is warm. Capillary refill takes less than 2 seconds.  Psychiatric: He has a normal mood and affect. His behavior is normal. Judgment and thought content normal.    Imaging:   IMPRESSION: 1. Intense radiotracer accumulation within dominant mass in the RIGHT hepatic lobe consistent with neuroendocrine tumor. 2. Smaller lesion with even greater  intensity in the LEFT hepatic lobe. 3. These hepatic lesions are not changed significantly in size from 09/11/2015 CT. 4. Stable mesenteric nodule adjacent to the duodenum with intense radiotracer uptake consists with neuroendocrine tumor.    Labs:  CBC: No results for input(s): WBC, HGB, HCT, PLT in the last 8760 hours.  COAGS: No results for input(s): INR, APTT in the last 8760 hours.  BMP: No results for input(s): NA, K, CL, CO2, GLUCOSE, BUN, CALCIUM, CREATININE, GFRNONAA, GFRAA in the last 8760 hours.  Invalid input(s): CMP  LIVER FUNCTION TESTS: No results for input(s): BILITOT, AST, ALT, ALKPHOS, PROT, ALBUMIN in the last 8760 hours.  TUMOR MARKERS: Recent Labs    02/18/17 0954 06/10/17 0931  CHROMOGRNA 70* 86*    Assessment and Plan:  Patient a good candidate for peptide receptor radiotherapyLu 177  DOTATATE Ephriam Knuckles)   67 year old male with well differentiated neuroendocrine tumor which is progressive on maintenance Sandostatin monthly injections.    Large tumor mass in the RIGHT hepatic lobe which has intense accumulation of the DOTATATE radiotracer.  Additional mesenteric nodes accumulate DOTATATE.[name]  Patient chromogranin A is elevated and increasing.    Patient has carcinoid symptoms include intermittent diarrhea.  Patient's last Sandostatin injection 02/01/2018.  Patient scheduled for peptide receptor radiotherapy (LU 177 DOTATATE) on 03/30/2018.  Therefore patient could receive 1 more Sandostatin injection on or about 02/27/2018.    Thank you for this interesting consult.  I greatly enjoyed meeting DAISEAN BRODHEAD and look forward to participating in their care.  A copy of this report was sent to the requesting provider on this date.  Electronically Signed: Rennis Golden, MD 02/04/2018, 2:04 PM   I spent a total of  30 Minutes   in face to face in clinical consultation, greater than 50% of which was counseling/coordinating care for metastatic  neuroendocrine tumor.

## 2018-02-07 ENCOUNTER — Other Ambulatory Visit: Payer: Self-pay | Admitting: Family Medicine

## 2018-02-11 ENCOUNTER — Other Ambulatory Visit: Payer: Self-pay | Admitting: Family Medicine

## 2018-02-14 ENCOUNTER — Other Ambulatory Visit: Payer: Self-pay | Admitting: Family Medicine

## 2018-02-14 NOTE — Telephone Encounter (Signed)
One mo only needs ov 

## 2018-02-16 ENCOUNTER — Other Ambulatory Visit: Payer: Self-pay | Admitting: Family Medicine

## 2018-02-17 NOTE — Telephone Encounter (Signed)
One mo plus one ref needs chronic

## 2018-02-21 ENCOUNTER — Encounter (HOSPITAL_COMMUNITY): Admission: RE | Admit: 2018-02-21 | Payer: Medicare Other | Source: Ambulatory Visit

## 2018-02-23 ENCOUNTER — Telehealth: Payer: Self-pay | Admitting: *Deleted

## 2018-02-23 NOTE — Telephone Encounter (Signed)
Call from [facility] in nuclear med: Pt missed appt for dotatate scan, it has been rescheduled to 4/2. Lutathera is scheduled for 4/10 and pt will receive sandostatin that day. 3/14 Sandostatin injection will be canceled at [facility].

## 2018-02-25 ENCOUNTER — Ambulatory Visit (AMBULATORY_SURGERY_CENTER): Payer: Self-pay

## 2018-02-25 ENCOUNTER — Other Ambulatory Visit: Payer: Self-pay

## 2018-02-25 VITALS — Ht 77.0 in | Wt 214.2 lb

## 2018-02-25 DIAGNOSIS — Z85038 Personal history of other malignant neoplasm of large intestine: Secondary | ICD-10-CM

## 2018-02-25 MED ORDER — PEG 3350-KCL-NA BICARB-NACL 420 G PO SOLR: 4000.0000 mL | Freq: Once | ORAL | 0 refills | Status: AC

## 2018-02-25 NOTE — Progress Notes (Signed)
Denies allergies to eggs or soy products. Denies complication of anesthesia or sedation. Denies use of weight loss medication. Denies use of O2.   Emmi instructions declined.  

## 2018-03-01 ENCOUNTER — Ambulatory Visit: Payer: Medicare Other

## 2018-03-01 ENCOUNTER — Ambulatory Visit: Payer: Medicare Other | Admitting: Oncology

## 2018-03-03 ENCOUNTER — Ambulatory Visit: Payer: Medicare Other | Admitting: Oncology

## 2018-03-03 ENCOUNTER — Ambulatory Visit: Payer: Medicare Other

## 2018-03-04 ENCOUNTER — Telehealth: Payer: Self-pay

## 2018-03-04 NOTE — Telephone Encounter (Signed)
Patient missed appointment, and was r/s for next week. Per 3/15 walk in

## 2018-03-08 ENCOUNTER — Inpatient Hospital Stay: Payer: Medicare Other | Attending: Oncology | Admitting: Oncology

## 2018-03-08 ENCOUNTER — Inpatient Hospital Stay: Payer: Medicare Other

## 2018-03-08 ENCOUNTER — Telehealth: Payer: Self-pay | Admitting: Oncology

## 2018-03-08 VITALS — BP 144/92 | HR 100 | Temp 98.3°F | Resp 20 | Ht 77.0 in | Wt 211.9 lb

## 2018-03-08 DIAGNOSIS — C787 Secondary malignant neoplasm of liver and intrahepatic bile duct: Secondary | ICD-10-CM | POA: Insufficient documentation

## 2018-03-08 DIAGNOSIS — D509 Iron deficiency anemia, unspecified: Secondary | ICD-10-CM | POA: Insufficient documentation

## 2018-03-08 DIAGNOSIS — R5383 Other fatigue: Secondary | ICD-10-CM | POA: Insufficient documentation

## 2018-03-08 DIAGNOSIS — C7A021 Malignant carcinoid tumor of the cecum: Secondary | ICD-10-CM | POA: Insufficient documentation

## 2018-03-08 DIAGNOSIS — C7B02 Secondary carcinoid tumors of liver: Secondary | ICD-10-CM | POA: Diagnosis not present

## 2018-03-08 DIAGNOSIS — R630 Anorexia: Secondary | ICD-10-CM | POA: Diagnosis not present

## 2018-03-08 DIAGNOSIS — Z86010 Personal history of colonic polyps: Secondary | ICD-10-CM | POA: Diagnosis not present

## 2018-03-08 DIAGNOSIS — R5381 Other malaise: Secondary | ICD-10-CM

## 2018-03-08 DIAGNOSIS — I10 Essential (primary) hypertension: Secondary | ICD-10-CM | POA: Insufficient documentation

## 2018-03-08 DIAGNOSIS — R197 Diarrhea, unspecified: Secondary | ICD-10-CM | POA: Diagnosis not present

## 2018-03-08 DIAGNOSIS — C7A012 Malignant carcinoid tumor of the ileum: Secondary | ICD-10-CM

## 2018-03-08 DIAGNOSIS — E340 Carcinoid syndrome: Secondary | ICD-10-CM

## 2018-03-08 NOTE — Progress Notes (Signed)
[place] OFFICE PROGRESS NOTE   Diagnosis: Carcinoid tumor  INTERVAL HISTORY:   Tommy Conley returns as scheduled.  He has noted increased malaise and anorexia recently.  He had an "spasm" at the right low lateral chest that was relieved with hycosamine.  He has intermittent diarrhea, up to 4-5 times per day.  He is scheduled to begin Lutathera 03/30/2018.  Objective:  Vital signs in last 24 hours:  Blood pressure (!) 144/92, pulse 100, temperature 98.3 F (36.8 C), temperature source Oral, resp. rate 20, height 6\' 5"  (1.956 m), weight 211 lb 14.4 oz (96.1 kg), SpO2 100 %.    Resp: End inspiratory rales at the posterior lateral base bilaterally, no respiratory distress Cardio: Regular rate and rhythm GI: No hepatomegaly, no mass, nontender Vascular: No leg edema   Lab Results:  Lab Results  Component Value Date   WBC 10.0 08/12/2016   HGB 12.8 (L) 08/12/2016   HCT 38.4 (L) 08/12/2016   MCV 83.5 08/12/2016   PLT 175 08/12/2016   NEUTROABS 7.8 (H) 08/12/2016    CMP     Component Value Date/Time   NA 142 02/03/2017 1016   NA 140 08/25/2016 1140   K 3.5 02/03/2017 1016   K 3.5 08/25/2016 1140   CL 102 02/03/2017 1016   CL 107 04/20/2013 1302   CO2 25 02/03/2017 1016   CO2 26 08/25/2016 1140   GLUCOSE 91 02/03/2017 1016   GLUCOSE 115 08/25/2016 1140   GLUCOSE 90 04/20/2013 1302   BUN 11 02/03/2017 1016   BUN 10.0 08/25/2016 1140   CREATININE 0.85 02/03/2017 1016   CREATININE 1.0 08/25/2016 1140   CALCIUM 9.2 02/03/2017 1016   CALCIUM 9.4 08/25/2016 1140   PROT 6.7 02/03/2017 1016   PROT 7.3 08/25/2016 1140   ALBUMIN 4.0 02/03/2017 1016   ALBUMIN 3.3 (L) 08/25/2016 1140   AST 20 02/03/2017 1016   AST 22 08/25/2016 1140   ALT 20 02/03/2017 1016   ALT 14 08/25/2016 1140   ALKPHOS 130 (H) 02/03/2017 1016   ALKPHOS 161 (H) 08/25/2016 1140   BILITOT 0.9 02/03/2017 1016   BILITOT 0.81 08/25/2016 1140   GFRNONAA 91 02/03/2017 1016   GFRAA 106  02/03/2017 1016     Medications: I have reviewed the patient's current medications.   Assessment/Plan: 1. 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  1. Status post a right colon/ileum resection on 09/29/2011.  2. Wedge biopsy of a right liver lesion on 09/29/2011 confirmed metastatic well-differentiated neuroendocrine carcinoma.  3. CT of the abdomen 08/25/2011 consistent with multiple liver metastases.  4. Elevated preoperative chromogranin A level and a 24-hour urine 5-HIAA level. Stable on repeat 11/16/2011. 5. Restaging CT on 12/28/2011 confirmed slight progression of the metastatic liver lesions. 6. Status post 3 cycles of Xeloda/temozolomide with cycle 3 initiated on 03/12/2012 7. Restaging CT of the abdomen 04/04/2012 revealed slight progression of the metastatic liver lesions and transverse mesocolon adenopathy. 8. Status post Y-90 radioembolization of the right liver on 05/19/2012. 9. Status post Y-90 radioembolization of the left liver on 07/06/2012. 10. Chromogranin A level decreased on 08/29/2012, 24-hour urine 5 HIAA slightly increased on 09/05/2012 11. Restaging CT on 12/05/2012 with a mixed response in the liver 12. Restaging CT 08/11/2013-mild increase in the size of liver lesions. 13. Restaging CT 01/22/2014-stable to slightly improved liver lesions, mild increase in mesenteric adenopathy. Chromogranin A level stable. 14. Chromogranin A level stable 05/01/2014. 15.  Chromogranin A higher on 08/02/2014 16. Restaging CT 11/06/2014 with interval enlargement of the enhancing lesions within the left and right hepatic lobe. 17. Initiation of monthly Sandostatin 12/24/2014 18. Status post Y-90 radioembolization of the right liver on 01/03/2015 19. Chromogranin A level stable 02/18/2015 20. Chromogranin A level improved 08/12/2015 21. Restaging CT 09/11/2015 with mild increase in the size of the  dominant liver metastasis in the right hepatic lobe. Other smaller liver metastases showed no significant change. Stable mild lymphadenopathy in central small bowel mesentery and right cardiophrenic angle. Stable subcm right middle lobe pulmonary nodule. 22. Monthly Sandostatin continued 23. Gallium-DOTATATE01/25/2018-increased activity of a dominant right liver mass and left liver lesion. Increased tracer accumulation in a mesenteric mass 2. History of diarrhea (3 to 4 times per day), predating surgery for 8 months. Decrease in daily bowel movements coinciding with discontinuation of Nexium. 3. Hypertension. 4. History of gastroesophageal reflux disease. No reflux symptoms since discontinuing Nexium. 5. Rectal and ascending colon polyps. He underwent a colonoscopy on 12/20/2012 with findings of a sessile polyp in the ascending colon and a sessile polyp in the rectum. Pathology showed hyperplastic polyps. Prior right colon resection noted with a normal appearing ileocolonic anastomosis. Small internal hemorrhoids noted. Repeat colonoscopy in 5 years. 6. History of Iron deficiency anemia 7. 08/12/2016 syncopal episode status post evaluation in the emergency department   Disposition: Tommy Conley appears unchanged.  The malaise and anorexia may be related to progression of the carcinoid tumor.  He has been maintained on monthly Sandostatin. He is scheduled for a DOTATATE scan on 03/22/2018 and a first treatment with Lutathera on 03/30/2018.  We will hold Sandostatin today.  He will contact us for increased diarrhea.  Tommy Conley will return for an office visit and Sandostatin on 04/27/2018.  Betsy Coder, MD  03/08/2018  8:38 AM

## 2018-03-08 NOTE — Telephone Encounter (Signed)
Scheduled appt per 3/19 los - Sent reminder letter in the mail with appt date and time.

## 2018-03-09 ENCOUNTER — Other Ambulatory Visit: Payer: Self-pay | Admitting: Family Medicine

## 2018-03-10 ENCOUNTER — Other Ambulatory Visit: Payer: Self-pay | Admitting: Family Medicine

## 2018-03-10 NOTE — Telephone Encounter (Signed)
14 d of all meds needs appt!!!

## 2018-03-11 ENCOUNTER — Encounter: Payer: Self-pay | Admitting: Gastroenterology

## 2018-03-11 ENCOUNTER — Ambulatory Visit (AMBULATORY_SURGERY_CENTER): Payer: Medicare Other | Admitting: Gastroenterology

## 2018-03-11 VITALS — BP 122/80 | HR 86 | Temp 97.5°F | Resp 18 | Ht 77.0 in | Wt 214.0 lb

## 2018-03-11 DIAGNOSIS — D122 Benign neoplasm of ascending colon: Secondary | ICD-10-CM

## 2018-03-11 DIAGNOSIS — I10 Essential (primary) hypertension: Secondary | ICD-10-CM | POA: Diagnosis not present

## 2018-03-11 DIAGNOSIS — D123 Benign neoplasm of transverse colon: Secondary | ICD-10-CM | POA: Diagnosis not present

## 2018-03-11 DIAGNOSIS — Z85038 Personal history of other malignant neoplasm of large intestine: Secondary | ICD-10-CM | POA: Diagnosis not present

## 2018-03-11 DIAGNOSIS — Z859 Personal history of malignant neoplasm, unspecified: Secondary | ICD-10-CM

## 2018-03-11 DIAGNOSIS — Z86010 Personal history of colonic polyps: Secondary | ICD-10-CM

## 2018-03-11 MED ORDER — SODIUM CHLORIDE 0.9 % IV SOLN
500.0000 mL | Freq: Once | INTRAVENOUS | Status: AC
  Administered 2018-07-20: 500 mL via INTRAVENOUS

## 2018-03-11 NOTE — Progress Notes (Signed)
Called to room to assist during endoscopic procedure.  Patient ID and intended procedure confirmed with present staff. Received instructions for my participation in the procedure from the performing physician.  

## 2018-03-11 NOTE — Op Note (Signed)
[place] Patient Name: Tommy Conley Procedure Date: 03/11/2018 11:05 AM MRN: [id number] Endoscopist: Ladene Artist , MD Age: 67 Referring MD:  Date of Birth: 28-Oct-1951 Gender: Male Account #: [id number] Procedure:                Colonoscopy Indications:              Surveillance: Personal history of adenomatous                            polyps on last colonoscopy 5 years ago Medicines:                Monitored Anesthesia Care Procedure:                Pre-Anesthesia Assessment:                           - Prior to the procedure, a History and Physical                            was performed, and patient medications and                            allergies were reviewed. The patient's tolerance of                            previous anesthesia was also reviewed. The risks                            and benefits of the procedure and the sedation                            options and risks were discussed with the patient.                            All questions were answered, and informed consent                            was obtained. Prior Anticoagulants: The patient has                            taken no previous anticoagulant or antiplatelet                            agents. ASA Grade Assessment: III - A patient with                            severe systemic disease. After reviewing the risks                            and benefits, the patient was deemed in                            satisfactory condition to undergo the procedure.  After obtaining informed consent, the colonoscope                            was passed under direct vision. Throughout the                            procedure, the patient's blood pressure, pulse, and                            oxygen saturations were monitored continuously. The                            Model PCF-H190DL [id number]) scope was introduced                            through the anus  and advanced to the the                            ileocolonic anastomosis. The rectum was                            photographed. The quality of the bowel preparation                            was good. The colonoscopy was performed without                            difficulty. The patient tolerated the procedure                            well. Scope In: 11:12:51 AM Scope Out: 11:25:29 AM Scope Withdrawal Time: 0 hours 9 minutes 52 seconds  Total Procedure Duration: 0 hours 12 minutes 38 seconds  Findings:                 The perianal and digital rectal examinations were                            normal.                           There was evidence of a prior end-to-side                            ileo-colonic anastomosis in the ascending colon.                            This was patent and was characterized by healthy                            appearing mucosa.                           A 5 mm polyp was found in the ascending colon. The  polyp was sessile. The polyp was removed with a                            cold biopsy forceps. Resection and retrieval were                            complete.                           Three sessile polyps were found in the transverse                            colon. The polyps were 6 to 8 mm in size. These                            polyps were removed with a cold snare. Resection                            and retrieval were complete.                           Multiple small-mouthed diverticula were found in                            the left colon. There was no evidence of                            diverticular bleeding.                           Internal hemorrhoids were found during                            retroflexion. The hemorrhoids were medium-sized and                            Grade I (internal hemorrhoids that do not prolapse).                           The exam was otherwise without abnormality  on                            direct and retroflexion views. Complications:            No immediate complications. Estimated blood loss:                            None. Estimated Blood Loss:     Estimated blood loss: none. Impression:               - Patent end-to-side ileo-colonic anastomosis,                            characterized by healthy appearing mucosa.                           -  One 5 mm polyp in the ascending colon, removed                            with a cold biopsy forceps. Resected and retrieved.                           - Three 6 to 8 mm polyps in the transverse colon,                            removed with a cold snare. Resected and retrieved.                           - Mild diverticulosis in the left colon. There was                            no evidence of diverticular bleeding.                           - Internal hemorrhoids.                           - The examination was otherwise normal on direct                            and retroflexion views. Recommendation:           - Repeat colonoscopy in 3 - 5 years for                            surveillance pending pathology review                           - Patient has a contact number available for                            emergencies. The signs and symptoms of potential                            delayed complications were discussed with the                            patient. Return to normal activities tomorrow.                            Written discharge instructions were provided to the                            patient.                           - Resume previous diet.                           - Continue present medications.                           -  Await pathology results. Ladene Artist, MD 03/11/2018 11:30:10 AM This report has been signed electronically.

## 2018-03-11 NOTE — Progress Notes (Signed)
To recovery, report to RN, VSS. 

## 2018-03-11 NOTE — Progress Notes (Signed)
Pt's states no medical or surgical changes since previsit or office visit. 

## 2018-03-11 NOTE — Patient Instructions (Signed)
Impression/Recommendations:  Polyp handout given to patient. Diverticulosis handout given to patient. Hemorrhoid handout given to patient.  Repeat colonoscopy in 3-5 years for surveillance, based on results of pathology report.  Resume previous diet. Continue present medications.  Await pathology results.  YOU HAD AN ENDOSCOPIC PROCEDURE TODAY AT [facility]:   Refer to the procedure report that was given to you for any specific questions about what was found during the examination.  If the procedure report does not answer your questions, please call your gastroenterologist to clarify.  If you requested that your care partner not be given the details of your procedure findings, then the procedure report has been included in a sealed envelope for you to review at your convenience later.  YOU SHOULD EXPECT: Some feelings of bloating in the abdomen. Passage of more gas than usual.  Walking can help get rid of the air that was put into your GI tract during the procedure and reduce the bloating. If you had a lower endoscopy (such as a colonoscopy or flexible sigmoidoscopy) you may notice spotting of blood in your stool or on the toilet paper. If you underwent a bowel prep for your procedure, you may not have a normal bowel movement for a few days.  Please Note:  You might notice some irritation and congestion in your nose or some drainage.  This is from the oxygen used during your procedure.  There is no need for concern and it should clear up in a day or so.  SYMPTOMS TO REPORT IMMEDIATELY:   Following lower endoscopy (colonoscopy or flexible sigmoidoscopy):  Excessive amounts of blood in the stool  Significant tenderness or worsening of abdominal pains  Swelling of the abdomen that is new, acute  Fever of 100F or higher  For urgent or emergent issues, a gastroenterologist can be reached at any hour by calling [phone].   DIET:  We do recommend a small meal at  first, but then you may proceed to your regular diet.  Drink plenty of fluids but you should avoid alcoholic beverages for 24 hours.  ACTIVITY:  You should plan to take it easy for the rest of today and you should NOT DRIVE or use heavy machinery until tomorrow (because of the sedation medicines used during the test).    FOLLOW UP: Our staff will call the number listed on your records the next business day following your procedure to check on you and address any questions or concerns that you may have regarding the information given to you following your procedure. If we do not reach you, we will leave a message.  However, if you are feeling well and you are not experiencing any problems, there is no need to return our call.  We will assume that you have returned to your regular daily activities without incident.  If any biopsies were taken you will be contacted by phone or by letter within the next 1-3 weeks.  Please call us at [phone] if you have not heard about the biopsies in 3 weeks.    SIGNATURES/CONFIDENTIALITY: You and/or your care partner have signed paperwork which will be entered into your electronic medical record.  These signatures attest to the fact that that the information above on your After Visit Summary has been reviewed and is understood.  Full responsibility of the confidentiality of this discharge information lies with you and/or your care-partner.

## 2018-03-11 NOTE — Telephone Encounter (Signed)
Whatever we did for his other rx's requested yesterday or so (I forget whether 30 days or half that) as noted in my message yest or so needs appt!!

## 2018-03-14 ENCOUNTER — Telehealth: Payer: Self-pay | Admitting: *Deleted

## 2018-03-14 NOTE — Telephone Encounter (Signed)
  Follow up Call-  Call back number 03/11/2018  Post procedure Call Back phone  # [phone]  Permission to leave phone message Yes  Some recent data might be hidden     Patient questions:  Do you have a fever, pain , or abdominal swelling? No. Pain Score  0 *  Have you tolerated food without any problems? Yes.    Have you been able to return to your normal activities? Yes.    Do you have any questions about your discharge instructions: Diet   No. Medications  No. Follow up visit  No.  Do you have questions or concerns about your Care? No.  Actions: * If pain score is 4 or above: No action needed, pain <4.

## 2018-03-22 ENCOUNTER — Encounter (HOSPITAL_COMMUNITY)
Admission: RE | Admit: 2018-03-22 | Discharge: 2018-03-22 | Disposition: A | Discharge status: Home / Self Care | Payer: Medicare Other | Source: Ambulatory Visit | Attending: Oncology | Admitting: Oncology

## 2018-03-22 DIAGNOSIS — C787 Secondary malignant neoplasm of liver and intrahepatic bile duct: Secondary | ICD-10-CM | POA: Diagnosis not present

## 2018-03-22 DIAGNOSIS — C7A1 Malignant poorly differentiated neuroendocrine tumors: Secondary | ICD-10-CM | POA: Diagnosis not present

## 2018-03-22 DIAGNOSIS — C7A021 Malignant carcinoid tumor of the cecum: Secondary | ICD-10-CM | POA: Insufficient documentation

## 2018-03-22 MED ORDER — GALLIUM GA 68 DOTATATE IV KIT
4.5300 | PACK | Freq: Once | INTRAVENOUS | Status: AC
  Administered 2018-03-22: 4.53 via INTRAVENOUS

## 2018-03-23 ENCOUNTER — Encounter: Payer: Self-pay | Admitting: Gastroenterology

## 2018-03-23 ENCOUNTER — Telehealth: Payer: Self-pay | Admitting: Emergency Medicine

## 2018-03-23 NOTE — Telephone Encounter (Addendum)
Pt verbalized understanding of this note.   ----- Message from Ladell Pier, MD sent at 03/23/2018  1:03 PM EDT ----- Please call patient, the nuclear medicine scan confirms uptake of his tumor with the DOTATATE peptide-he is therefore a candidate for Lutathera

## 2018-03-26 ENCOUNTER — Other Ambulatory Visit: Payer: Self-pay | Admitting: Family Medicine

## 2018-03-28 NOTE — Telephone Encounter (Signed)
Dr Mariane Duval

## 2018-03-29 NOTE — Telephone Encounter (Signed)
Left message to return call 

## 2018-03-29 NOTE — Telephone Encounter (Signed)
Call pt. sched wellness plus chronic, overdue, then may give 30 d worth of meds

## 2018-03-30 ENCOUNTER — Ambulatory Visit (HOSPITAL_COMMUNITY)
Admission: RE | Admit: 2018-03-30 | Discharge: 2018-03-30 | Disposition: A | Discharge status: Home / Self Care | Payer: Medicare Other | Source: Ambulatory Visit | Attending: Nurse Practitioner | Admitting: Nurse Practitioner

## 2018-03-30 DIAGNOSIS — C7B02 Secondary carcinoid tumors of liver: Secondary | ICD-10-CM | POA: Diagnosis not present

## 2018-03-30 DIAGNOSIS — C7A021 Malignant carcinoid tumor of the cecum: Secondary | ICD-10-CM

## 2018-03-30 LAB — COMPREHENSIVE METABOLIC PANEL
ALT: 34 U/L (ref 17–63)
AST: 34 U/L (ref 15–41)
Albumin: 3.3 g/dL — ABNORMAL LOW (ref 3.5–5.0)
Alkaline Phosphatase: 174 U/L — ABNORMAL HIGH (ref 38–126)
Anion gap: 11 (ref 5–15)
BUN: 13 mg/dL (ref 6–20)
CO2: 21 mmol/L — ABNORMAL LOW (ref 22–32)
CREATININE: 0.96 mg/dL (ref 0.61–1.24)
Calcium: 8.9 mg/dL (ref 8.9–10.3)
Chloride: 107 mmol/L (ref 101–111)
Glucose, Bld: 143 mg/dL — ABNORMAL HIGH (ref 65–99)
POTASSIUM: 3.1 mmol/L — AB (ref 3.5–5.1)
Sodium: 139 mmol/L (ref 135–145)
TOTAL PROTEIN: 7 g/dL (ref 6.5–8.1)
Total Bilirubin: 0.4 mg/dL (ref 0.3–1.2)

## 2018-03-30 LAB — CBC WITH DIFFERENTIAL/PLATELET
BASOS ABS: 0 10*3/uL (ref 0.0–0.1)
Basophils Relative: 0 %
EOS ABS: 0.1 10*3/uL (ref 0.0–0.7)
Eosinophils Relative: 2 %
HCT: 38.7 % — ABNORMAL LOW (ref 39.0–52.0)
HEMOGLOBIN: 12.6 g/dL — AB (ref 13.0–17.0)
LYMPHS ABS: 1.7 10*3/uL (ref 0.7–4.0)
Lymphocytes Relative: 26 %
MCH: 27 pg (ref 26.0–34.0)
MCHC: 32.6 g/dL (ref 30.0–36.0)
MCV: 82.9 fL (ref 78.0–100.0)
Monocytes Absolute: 0.8 10*3/uL (ref 0.1–1.0)
Monocytes Relative: 11 %
NEUTROS PCT: 61 %
Neutro Abs: 4.1 10*3/uL (ref 1.7–7.7)
Platelets: 244 10*3/uL (ref 150–400)
RBC: 4.67 MIL/uL (ref 4.22–5.81)
RDW: 15.9 % — ABNORMAL HIGH (ref 11.5–15.5)
WBC: 6.8 10*3/uL (ref 4.0–10.5)

## 2018-03-30 MED ORDER — SODIUM CHLORIDE 0.9 % IV SOLN: 500.0000 mL | Freq: Once | INTRAVENOUS | Status: DC

## 2018-03-30 MED ORDER — SODIUM CHLORIDE 0.9 % IV SOLN
8.0000 mg | Freq: Once | INTRAVENOUS | Status: AC
  Administered 2018-03-30: 8 mg via INTRAVENOUS
  Filled 2018-03-30: qty 4

## 2018-03-30 MED ORDER — LUTETIUM LU 177 DOTATATE 370 MBQ/ML IV SOLN: 200.0000 | Freq: Once | INTRAVENOUS | Status: DC

## 2018-03-30 MED ORDER — OCTREOTIDE ACETATE 500 MCG/ML IJ SOLN
INTRAMUSCULAR | Status: AC
  Filled 2018-03-30: qty 1

## 2018-03-30 MED ORDER — OCTREOTIDE ACETATE 500 MCG/ML IJ SOLN: 500.0000 ug | Freq: Once | INTRAMUSCULAR | Status: DC | PRN

## 2018-03-30 MED ORDER — AMINO ACID RADIOPROTECTANT - L-LYSINE 2.5%/L-ARGININE 2.5% IN NS
250.0000 mL/h | INTRAVENOUS | Status: AC
  Administered 2018-03-30: 250 mL/h via INTRAVENOUS
  Filled 2018-03-30: qty 1000

## 2018-03-30 MED ORDER — OCTREOTIDE ACETATE 30 MG IM KIT
30.0000 mg | PACK | Freq: Once | INTRAMUSCULAR | Status: AC
  Administered 2018-03-30: 30 mg via INTRAMUSCULAR

## 2018-03-30 MED ORDER — OCTREOTIDE ACETATE 30 MG IM KIT
PACK | INTRAMUSCULAR | Status: AC
  Filled 2018-03-30: qty 1

## 2018-03-30 NOTE — Progress Notes (Signed)
RADIOPHARMACEUTICALS:  [213.4 mCi Lu 177 DOTATATE  FINDINGS: Diagnosis: [Metastatic neuroendocrine tumor.]  Current Infusion: [1]  Planned Infusions: [4]  Patient presented to nuclear medicine for treatment. The patient's most recent blood counts were reviewed and remains a good candidate to proceed with Lutathera.  Patient reports of intermittent fatigue.  Minimal diarrhea.  The patient was situated in an infusion suite and administered Lutathera as above. Patient will follow-up with referring oncologist for interval serum laboratories.  Patient received 30 mg IM long-acting Sandostatin injection 4 hours after Lutathera effusion in the nuclear medicine department.  IMPRESSION:  [First] LD 357 [id number] treatment for metastatic neuroendocrine tumor. The patient tolerated the infusion well. The patient will return in 8 to 10 weeks for ongoing care.  Patient received 30 mg IM long-acting Sandostatin injection 4 hours after Lutathera effusion in the nuclear medicine department.

## 2018-04-05 ENCOUNTER — Telehealth: Payer: Self-pay | Admitting: Oncology

## 2018-04-05 NOTE — Telephone Encounter (Signed)
Rescheduled appt per 4/15 sch msg - spoke with patient regarding appts.

## 2018-04-11 ENCOUNTER — Other Ambulatory Visit: Payer: Self-pay | Admitting: Family Medicine

## 2018-04-12 ENCOUNTER — Encounter: Payer: Self-pay | Admitting: Family Medicine

## 2018-04-12 ENCOUNTER — Ambulatory Visit (INDEPENDENT_AMBULATORY_CARE_PROVIDER_SITE_OTHER): Payer: Medicare Other | Admitting: Family Medicine

## 2018-04-12 VITALS — BP 132/82 | Ht 72.0 in | Wt 217.4 lb

## 2018-04-12 DIAGNOSIS — Z23 Encounter for immunization: Secondary | ICD-10-CM | POA: Diagnosis not present

## 2018-04-12 DIAGNOSIS — Z0001 Encounter for general adult medical examination with abnormal findings: Secondary | ICD-10-CM | POA: Diagnosis not present

## 2018-04-12 DIAGNOSIS — Z125 Encounter for screening for malignant neoplasm of prostate: Secondary | ICD-10-CM | POA: Diagnosis not present

## 2018-04-12 DIAGNOSIS — N400 Enlarged prostate without lower urinary tract symptoms: Secondary | ICD-10-CM | POA: Diagnosis not present

## 2018-04-12 DIAGNOSIS — I10 Essential (primary) hypertension: Secondary | ICD-10-CM | POA: Diagnosis not present

## 2018-04-12 DIAGNOSIS — Z0000 Encounter for general adult medical examination without abnormal findings: Secondary | ICD-10-CM

## 2018-04-12 MED ORDER — HYDROCHLOROTHIAZIDE 25 MG PO TABS: ORAL_TABLET | ORAL | 5 refills | Status: DC

## 2018-04-12 MED ORDER — METOPROLOL SUCCINATE ER 50 MG PO TB24: 50.0000 mg | ORAL_TABLET | Freq: Every day | ORAL | 5 refills | Status: DC

## 2018-04-12 MED ORDER — AMLODIPINE BESYLATE 10 MG PO TABS: 10.0000 mg | ORAL_TABLET | Freq: Every day | ORAL | 5 refills | Status: DC

## 2018-04-12 MED ORDER — LOSARTAN POTASSIUM 100 MG PO TABS: ORAL_TABLET | ORAL | 5 refills | Status: DC

## 2018-04-12 MED ORDER — HYDRALAZINE HCL 50 MG PO TABS: 50.0000 mg | ORAL_TABLET | Freq: Three times a day (TID) | ORAL | 1 refills | Status: DC

## 2018-04-12 MED ORDER — POTASSIUM CHLORIDE CRYS ER 20 MEQ PO TBCR: EXTENDED_RELEASE_TABLET | ORAL | 5 refills | Status: DC

## 2018-04-12 NOTE — Progress Notes (Signed)
   Subjective:    Patient ID: Tommy Conley, male    DOB: 1951-05-20, 67 y.o.   MRN: [id number]  HPI The patient comes in today for a wellness visit.    A review of their health history was completed.  A review of medications was also completed.  Any needed refills; yes  Eating habits: trying to eat healthy  Falls/  MVA accidents in past few months: none  Regular exercise: not really  Specialist pt sees on regular basis: cancer dr  Preventative health issues were discussed.   Additional concerns: none  Blood pressure medicine and blood pressure levels reviewed today with patient. Compliant with blood pressure medicine. States does not miss a dose. No obvious side effects. Blood pressure generally good when checked elsewhere. Watching salt intake.  On flomax takes regulaly  n o recent     Screening blood work   Review of Systems  Constitutional: Negative for activity change, appetite change and fever.  HENT: Negative for congestion and rhinorrhea.   Eyes: Negative for discharge.  Respiratory: Negative for cough and wheezing.   Cardiovascular: Negative for chest pain.  Gastrointestinal: Negative for abdominal pain, blood in stool and vomiting.  Genitourinary: Negative for difficulty urinating and frequency.  Musculoskeletal: Negative for neck pain.  Skin: Negative for rash.  Allergic/Immunologic: Negative for environmental allergies and food allergies.  Neurological: Negative for weakness and headaches.  Psychiatric/Behavioral: Negative for agitation.  All other systems reviewed and are negative.      Objective:   Physical Exam  Constitutional: He appears well-developed and well-nourished.  Blood pressure 152/90 on repeat  HENT:  Head: Normocephalic and atraumatic.  Right Ear: External ear normal.  Left Ear: External ear normal.  Nose: Nose normal.  Mouth/Throat: Oropharynx is clear and moist.  Eyes: Right eye exhibits no discharge. Left eye exhibits no  discharge. No scleral icterus.  Neck: Normal range of motion. Neck supple. No thyromegaly present.  Cardiovascular: Normal rate, regular rhythm and normal heart sounds.  No murmur heard. Pulmonary/Chest: Effort normal and breath sounds normal. No respiratory distress. He has no wheezes.  Abdominal: Soft. Bowel sounds are normal. He exhibits no distension and no mass. There is no tenderness.  Genitourinary: Penis normal.  Musculoskeletal: Normal range of motion. He exhibits no edema.  Lymphadenopathy:    He has no cervical adenopathy.  Neurological: He is alert. He exhibits normal muscle tone. Coordination normal.  Skin: Skin is warm and dry. No erythema.  Psychiatric: He has a normal mood and affect. His behavior is normal. Judgment normal.  Vitals reviewed.         Assessment & Plan:  Impression1 wellness exam.  Up-to-date on colonoscopy.  Diet discussed.  Exercise discussed.  Vaccines discussed.  Prevnar today.  2.  Hypertension.  Suboptimal control.  Complicated by #3.  Could have secretions affecting blood pressure discussed.  Will add beta-blocker therapy.  3.  Chronic carcinoid tumor.  On new therapy  4.  Prostate hypertrophy.  Discussed.  Flomax initiated  Follow-up in 6 months with/medications refilled diet exercise discussed

## 2018-04-15 ENCOUNTER — Other Ambulatory Visit: Payer: Self-pay | Admitting: Family Medicine

## 2018-04-21 DIAGNOSIS — Z125 Encounter for screening for malignant neoplasm of prostate: Secondary | ICD-10-CM | POA: Diagnosis not present

## 2018-04-21 DIAGNOSIS — I10 Essential (primary) hypertension: Secondary | ICD-10-CM | POA: Diagnosis not present

## 2018-04-22 ENCOUNTER — Encounter: Payer: Self-pay | Admitting: Family Medicine

## 2018-04-22 LAB — BASIC METABOLIC PANEL
BUN / CREAT RATIO: 12 (ref 10–24)
BUN: 12 mg/dL (ref 8–27)
CALCIUM: 9 mg/dL (ref 8.6–10.2)
CHLORIDE: 105 mmol/L (ref 96–106)
CO2: 22 mmol/L (ref 20–29)
Creatinine, Ser: 0.99 mg/dL (ref 0.76–1.27)
GFR calc non Af Amer: 79 mL/min/{1.73_m2} (ref >59)
GFR, EST AFRICAN AMERICAN: 91 mL/min/{1.73_m2} (ref >59)
Glucose: 86 mg/dL (ref 65–99)
Potassium: 3.5 mmol/L (ref 3.5–5.2)
Sodium: 144 mmol/L (ref 134–144)

## 2018-04-22 LAB — HEPATIC FUNCTION PANEL
ALT: 20 IU/L (ref 0–44)
AST: 21 IU/L (ref 0–40)
Albumin: 3.9 g/dL (ref 3.6–4.8)
Alkaline Phosphatase: 129 IU/L — ABNORMAL HIGH (ref 39–117)
BILIRUBIN TOTAL: 0.5 mg/dL (ref 0.0–1.2)
Bilirubin, Direct: 0.21 mg/dL (ref 0.00–0.40)
Total Protein: 6.7 g/dL (ref 6.0–8.5)

## 2018-04-22 LAB — LIPID PANEL
CHOL/HDL RATIO: 2.9 ratio (ref 0.0–5.0)
CHOLESTEROL TOTAL: 161 mg/dL (ref 100–199)
HDL: 55 mg/dL (ref >39)
LDL CALC: 91 mg/dL (ref 0–99)
TRIGLYCERIDES: 74 mg/dL (ref 0–149)
VLDL Cholesterol Cal: 15 mg/dL (ref 5–40)

## 2018-04-22 LAB — PSA: PROSTATE SPECIFIC AG, SERUM: 1.5 ng/mL (ref 0.0–4.0)

## 2018-04-27 ENCOUNTER — Ambulatory Visit: Payer: Medicare Other | Admitting: Nurse Practitioner

## 2018-04-27 ENCOUNTER — Ambulatory Visit: Payer: Medicare Other

## 2018-04-27 ENCOUNTER — Other Ambulatory Visit: Payer: Medicare Other

## 2018-04-28 ENCOUNTER — Telehealth: Payer: Self-pay | Admitting: Nurse Practitioner

## 2018-04-28 ENCOUNTER — Encounter: Payer: Self-pay | Admitting: Nurse Practitioner

## 2018-04-28 ENCOUNTER — Inpatient Hospital Stay: Payer: Medicare Other

## 2018-04-28 ENCOUNTER — Inpatient Hospital Stay: Payer: Medicare Other | Attending: Oncology

## 2018-04-28 ENCOUNTER — Telehealth: Payer: Self-pay

## 2018-04-28 ENCOUNTER — Inpatient Hospital Stay (HOSPITAL_BASED_OUTPATIENT_CLINIC_OR_DEPARTMENT_OTHER): Payer: Medicare Other | Admitting: Nurse Practitioner

## 2018-04-28 VITALS — BP 160/97 | HR 74 | Temp 97.5°F | Resp 18 | Ht 72.0 in | Wt 221.1 lb

## 2018-04-28 DIAGNOSIS — C7A021 Malignant carcinoid tumor of the cecum: Secondary | ICD-10-CM

## 2018-04-28 DIAGNOSIS — I10 Essential (primary) hypertension: Secondary | ICD-10-CM

## 2018-04-28 DIAGNOSIS — E876 Hypokalemia: Secondary | ICD-10-CM

## 2018-04-28 DIAGNOSIS — C7A098 Malignant carcinoid tumors of other sites: Secondary | ICD-10-CM

## 2018-04-28 DIAGNOSIS — K219 Gastro-esophageal reflux disease without esophagitis: Secondary | ICD-10-CM | POA: Insufficient documentation

## 2018-04-28 DIAGNOSIS — R197 Diarrhea, unspecified: Secondary | ICD-10-CM | POA: Insufficient documentation

## 2018-04-28 DIAGNOSIS — Z86010 Personal history of colonic polyps: Secondary | ICD-10-CM | POA: Diagnosis not present

## 2018-04-28 DIAGNOSIS — Z79899 Other long term (current) drug therapy: Secondary | ICD-10-CM | POA: Insufficient documentation

## 2018-04-28 LAB — CBC WITH DIFFERENTIAL (CANCER CENTER ONLY)
BASOS ABS: 0 10*3/uL (ref 0.0–0.1)
BASOS PCT: 0 %
EOS ABS: 0.1 10*3/uL (ref 0.0–0.5)
EOS PCT: 1 %
HEMATOCRIT: 38.3 % — AB (ref 38.4–49.9)
HEMOGLOBIN: 12.6 g/dL — AB (ref 13.0–17.1)
LYMPHS ABS: 0.9 10*3/uL (ref 0.9–3.3)
Lymphocytes Relative: 14 %
MCH: 27.5 pg (ref 27.2–33.4)
MCHC: 32.9 g/dL (ref 32.0–36.0)
MCV: 83.4 fL (ref 79.3–98.0)
Monocytes Absolute: 0.9 10*3/uL (ref 0.1–0.9)
Monocytes Relative: 15 %
Neutro Abs: 4.4 10*3/uL (ref 1.5–6.5)
Neutrophils Relative %: 70 %
Platelet Count: 129 10*3/uL — ABNORMAL LOW (ref 140–400)
RBC: 4.59 MIL/uL (ref 4.20–5.82)
RDW: 16.7 % — ABNORMAL HIGH (ref 11.0–14.6)
WBC: 6.2 10*3/uL (ref 4.0–10.3)

## 2018-04-28 LAB — CMP (CANCER CENTER ONLY)
ALK PHOS: 134 U/L (ref 40–150)
ALT: 20 U/L (ref 0–55)
AST: 20 U/L (ref 5–34)
Albumin: 3.6 g/dL (ref 3.5–5.0)
Anion gap: 7 (ref 3–11)
BILIRUBIN TOTAL: 0.5 mg/dL (ref 0.2–1.2)
BUN: 14 mg/dL (ref 7–26)
CALCIUM: 9.1 mg/dL (ref 8.4–10.4)
CO2: 26 mmol/L (ref 22–29)
CREATININE: 0.94 mg/dL (ref 0.70–1.30)
Chloride: 109 mmol/L (ref 98–109)
Glucose, Bld: 83 mg/dL (ref 70–140)
Potassium: 3.1 mmol/L — ABNORMAL LOW (ref 3.5–5.1)
Sodium: 142 mmol/L (ref 136–145)
TOTAL PROTEIN: 6.9 g/dL (ref 6.4–8.3)

## 2018-04-28 MED ORDER — OCTREOTIDE ACETATE 20 MG IM KIT: 20.0000 mg | PACK | Freq: Once | INTRAMUSCULAR | Status: DC

## 2018-04-28 MED ORDER — PROCHLORPERAZINE MALEATE 5 MG PO TABS
5.0000 mg | ORAL_TABLET | Freq: Four times a day (QID) | ORAL | 0 refills | Status: DC | PRN
Start: 2018-04-28 — End: 2020-05-08

## 2018-04-28 MED ORDER — OCTREOTIDE ACETATE 30 MG IM KIT
30.0000 mg | PACK | Freq: Once | INTRAMUSCULAR | Status: AC
  Administered 2018-04-28: 30 mg via INTRAMUSCULAR

## 2018-04-28 MED ORDER — OCTREOTIDE ACETATE 30 MG IM KIT
PACK | INTRAMUSCULAR | Status: AC
  Filled 2018-04-28: qty 1

## 2018-04-28 NOTE — Telephone Encounter (Signed)
Pt has an appointment. However, can not be located. Status showing that pt is arrived. Attempted to call pt to locate him. Left VM

## 2018-04-28 NOTE — Progress Notes (Signed)
[facility] OFFICE PROGRESS NOTE   Diagnosis: Carcinoid tumor  INTERVAL HISTORY:   Tommy Conley returns as scheduled.  He completed a first treatment with Lutathera 03/30/2018.  He had mild nausea following the Lutathera.  No vomiting.  He continues to have intermittent loose stools.  No rash.  No pain.  Objective:  Vital signs in last 24 hours:  Blood pressure (!) 160/97, pulse 74, temperature (!) 97.5 F (36.4 C), temperature source Oral, resp. rate 18, height 6' (1.829 m), weight 221 lb 1.6 oz (100.3 kg), SpO2 100 %.    HEENT: No thrush or ulcers. Resp: Lungs clear bilaterally. Cardio: Regular rate and rhythm. GI: Abdomen soft and nontender.  No hepatomegaly. Vascular: No leg edema.   Lab Results:  Lab Results  Component Value Date   WBC 6.2 04/28/2018   HGB 12.6 (L) 04/28/2018   HCT 38.3 (L) 04/28/2018   MCV 83.4 04/28/2018   PLT 129 (L) 04/28/2018   NEUTROABS 4.4 04/28/2018    Imaging:  No results found.  Medications: I have reviewed the patient's current medications.  Assessment/Plan: 1. 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  1. Status post a right colon/ileum resection on 09/29/2011.  2. Wedge biopsy of a right liver lesion on 09/29/2011 confirmed metastatic well-differentiated neuroendocrine carcinoma.  3. CT of the abdomen 08/25/2011 consistent with multiple liver metastases.  4. Elevated preoperative chromogranin A level and a 24-hour urine 5-HIAA level. Stable on repeat 11/16/2011. 5. Restaging CT on 12/28/2011 confirmed slight progression of the metastatic liver lesions. 6. Status post 3 cycles of Xeloda/temozolomide with cycle 3 initiated on 03/12/2012 7. Restaging CT of the abdomen 04/04/2012 revealed slight progression of the metastatic liver lesions and transverse mesocolon adenopathy. 8. Status post Y-90 radioembolization of the right liver on  05/19/2012. 9. Status post Y-90 radioembolization of the left liver on 07/06/2012. 10. Chromogranin A level decreased on 08/29/2012, 24-hour urine 5 HIAA slightly increased on 09/05/2012 11. Restaging CT on 12/05/2012 with a mixed response in the liver 12. Restaging CT 08/11/2013-mild increase in the size of liver lesions. 13. Restaging CT 01/22/2014-stable to slightly improved liver lesions, mild increase in mesenteric adenopathy. Chromogranin A level stable. 14. Chromogranin A level stable 05/01/2014. 15. Chromogranin A higher on 08/02/2014 16. Restaging CT 11/06/2014 with interval enlargement of the enhancing lesions within the left and right hepatic lobe. 17. Initiation of monthly Sandostatin 12/24/2014 18. Status post Y-90 radioembolization of the right liver on 01/03/2015 19. Chromogranin A level stable 02/18/2015 20. Chromogranin A level improved 08/12/2015 21. Restaging CT 09/11/2015 with mild increase in the size of the dominant liver metastasis in the right hepatic lobe. Other smaller liver metastases showed no significant change. Stable mild lymphadenopathy in central small bowel mesentery and right cardiophrenic angle. Stable subcm right middle lobe pulmonary nodule. 22. Monthly Sandostatin continued 23. Gallium-DOTATATE01/25/2018-increased activity of a dominant right liver mass and left liver lesion. Increased tracer accumulation in a mesenteric mass 24. Lutathera 03/30/2018 2. History of diarrhea (3 to 4 times per day), predating surgery for 8 months. Decrease in daily bowel movements coinciding with discontinuation of Nexium. 3. Hypertension. 4. History of gastroesophageal reflux disease. No reflux symptoms since discontinuing Nexium. 5. Rectal and ascending colon polyps. He underwent a colonoscopy on 12/20/2012 with findings of a sessile polyp in the ascending colon and a sessile polyp in the rectum. Pathology showed hyperplastic polyps. Prior right colon resection noted with a  normal appearing  ileocolonic anastomosis. Small internal hemorrhoids noted. Repeat colonoscopy in 5 years. 6. History of Iron deficiency anemia 7. 08/12/2016 syncopal episode status post evaluation in the emergency department      Disposition: Tommy Conley appears stable.  He has completed 1 treatment with Lutathera.  He will receive a Sandostatin injection today.  He is scheduled for the next Lutathera 05/25/2018.  He will return for lab and a follow-up visit here 06/22/2018.  He will contact the office in the interim with any problems.  He has hypokalemia on labs today.  This is likely related to hydrochlorothiazide and diarrhea.  He is currently taking kdur 20 mEq daily.  He will increase to 20 mEq twice daily.  Plan reviewed with Tommy Conley.    Tommy Conley ANP/GNP-BC   04/28/2018  4:27 PM

## 2018-04-28 NOTE — Telephone Encounter (Signed)
Scheduled apt per 5/9 los -left message with appt date and time and sent reminder letter in the mail ./

## 2018-05-07 ENCOUNTER — Other Ambulatory Visit: Payer: Self-pay | Admitting: Family Medicine

## 2018-05-25 ENCOUNTER — Other Ambulatory Visit (HOSPITAL_COMMUNITY): Payer: Medicare Other

## 2018-05-25 ENCOUNTER — Encounter (HOSPITAL_COMMUNITY)
Admission: RE | Admit: 2018-05-25 | Discharge: 2018-05-25 | Disposition: A | Discharge status: Home / Self Care | Payer: Medicare Other | Source: Ambulatory Visit | Attending: Nurse Practitioner | Admitting: Nurse Practitioner

## 2018-05-25 DIAGNOSIS — C220 Liver cell carcinoma: Secondary | ICD-10-CM | POA: Diagnosis not present

## 2018-05-25 DIAGNOSIS — C7A021 Malignant carcinoid tumor of the cecum: Secondary | ICD-10-CM | POA: Insufficient documentation

## 2018-05-25 LAB — COMPREHENSIVE METABOLIC PANEL
ALT: 22 U/L (ref 17–63)
AST: 26 U/L (ref 15–41)
Albumin: 3.7 g/dL (ref 3.5–5.0)
Alkaline Phosphatase: 138 U/L — ABNORMAL HIGH (ref 38–126)
Anion gap: 8 (ref 5–15)
BILIRUBIN TOTAL: 0.5 mg/dL (ref 0.3–1.2)
BUN: 13 mg/dL (ref 6–20)
CHLORIDE: 106 mmol/L (ref 101–111)
CO2: 26 mmol/L (ref 22–32)
CREATININE: 0.93 mg/dL (ref 0.61–1.24)
Calcium: 8.8 mg/dL — ABNORMAL LOW (ref 8.9–10.3)
GFR calc Af Amer: >60 mL/min (ref >60)
GLUCOSE: 170 mg/dL — AB (ref 65–99)
Potassium: 3 mmol/L — ABNORMAL LOW (ref 3.5–5.1)
Sodium: 140 mmol/L (ref 135–145)
Total Protein: 6.9 g/dL (ref 6.5–8.1)

## 2018-05-25 LAB — CBC WITH DIFFERENTIAL/PLATELET
Basophils Absolute: 0 10*3/uL (ref 0.0–0.1)
Basophils Relative: 0 %
EOS ABS: 0.1 10*3/uL (ref 0.0–0.7)
EOS PCT: 1 %
HCT: 37.4 % — ABNORMAL LOW (ref 39.0–52.0)
Hemoglobin: 12.1 g/dL — ABNORMAL LOW (ref 13.0–17.0)
LYMPHS ABS: 1.2 10*3/uL (ref 0.7–4.0)
Lymphocytes Relative: 22 %
MCH: 27.5 pg (ref 26.0–34.0)
MCHC: 32.4 g/dL (ref 30.0–36.0)
MCV: 85 fL (ref 78.0–100.0)
Monocytes Absolute: 0.4 10*3/uL (ref 0.1–1.0)
Monocytes Relative: 8 %
Neutro Abs: 3.7 10*3/uL (ref 1.7–7.7)
Neutrophils Relative %: 69 %
Platelets: 151 10*3/uL (ref 150–400)
RBC: 4.4 MIL/uL (ref 4.22–5.81)
RDW: 15.7 % — AB (ref 11.5–15.5)
WBC: 5.4 10*3/uL (ref 4.0–10.5)

## 2018-05-25 MED ORDER — ONDANSETRON HCL 8 MG PO TABS: 8.0000 mg | ORAL_TABLET | Freq: Two times a day (BID) | ORAL | 0 refills | Status: DC | PRN

## 2018-05-25 MED ORDER — OCTREOTIDE ACETATE 30 MG IM KIT
30.0000 mg | PACK | Freq: Once | INTRAMUSCULAR | Status: AC
  Administered 2018-05-25: 30 mg via INTRAMUSCULAR

## 2018-05-25 MED ORDER — PROCHLORPERAZINE EDISYLATE 10 MG/2ML IJ SOLN
10.0000 mg | Freq: Four times a day (QID) | INTRAMUSCULAR | Status: DC | PRN
  Filled 2018-05-25: qty 2

## 2018-05-25 MED ORDER — OCTREOTIDE ACETATE 500 MCG/ML IJ SOLN
INTRAMUSCULAR | Status: AC
  Filled 2018-05-25: qty 1

## 2018-05-25 MED ORDER — SODIUM CHLORIDE 0.9 % IV SOLN
500.0000 mL | Freq: Once | INTRAVENOUS | Status: AC
  Administered 2018-05-25: 500 mL via INTRAVENOUS

## 2018-05-25 MED ORDER — OCTREOTIDE ACETATE 500 MCG/ML IJ SOLN: 500.0000 ug | Freq: Once | INTRAMUSCULAR | Status: DC | PRN

## 2018-05-25 MED ORDER — LUTETIUM LU 177 DOTATATE 370 MBQ/ML IV SOLN
200.0000 | Freq: Once | INTRAVENOUS | Status: AC
  Administered 2018-05-25: 206.2 via INTRAVENOUS

## 2018-05-25 MED ORDER — SODIUM CHLORIDE 0.9 % IV SOLN
8.0000 mg | Freq: Once | INTRAVENOUS | Status: AC
  Administered 2018-05-25: 8 mg via INTRAVENOUS
  Filled 2018-05-25: qty 4

## 2018-05-25 MED ORDER — AMINO ACID RADIOPROTECTANT - L-LYSINE 2.5%/L-ARGININE 2.5% IN NS
250.0000 mL/h | INTRAVENOUS | Status: AC
  Administered 2018-05-25: 250 mL/h via INTRAVENOUS
  Filled 2018-05-25: qty 1000

## 2018-05-25 MED ORDER — OCTREOTIDE ACETATE 30 MG IM KIT
PACK | INTRAMUSCULAR | Status: AC
  Filled 2018-05-25: qty 1

## 2018-05-25 NOTE — Progress Notes (Signed)
RADIOPHARMACEUTICALS:  [206.2] mCi Lu 177 DOTATATE  FINDINGS: Diagnosis: [Metastatic neuroendocrine tumor.]  Current Infusion: [2]  Planned Infusions: [4]  Patient presented to nuclear medicine for treatment.  Patient reports improved energy with less fatigue following first therapy.  Patient also reports decreased episodes of diarrhea.  The patient's most recent blood counts were reviewed and remains a good candidate to proceed with Lutathera. The patient was situated in an infusion suite and administered Lutathera as above. Patient will follow-up with referring oncologist for interval serum laboratories.  Patient received 30 mg IM long-acting Sandostatin injection 4 hours after Lutathera effusion in the nuclear medicine department.  IMPRESSION:  [Second] MC 802 [id number] treatment for metastatic neuroendocrine tumor. The patient tolerated the infusion well. The patient will return in 8 weeks for a third treatment.  Patient may receive a interval Sandostatin injection in 4 weeks.[name]

## 2018-05-30 ENCOUNTER — Other Ambulatory Visit: Payer: Self-pay | Admitting: Family Medicine

## 2018-05-30 LAB — CHROMOGRANIN A: CHROMOGRANIN A: 66 nmol/L — AB (ref 0–5)

## 2018-06-22 ENCOUNTER — Inpatient Hospital Stay: Payer: Medicare Other

## 2018-06-22 ENCOUNTER — Inpatient Hospital Stay (HOSPITAL_BASED_OUTPATIENT_CLINIC_OR_DEPARTMENT_OTHER): Payer: Medicare Other | Admitting: Oncology

## 2018-06-22 ENCOUNTER — Inpatient Hospital Stay: Payer: Medicare Other | Attending: Oncology

## 2018-06-22 ENCOUNTER — Encounter: Payer: Self-pay | Admitting: Oncology

## 2018-06-22 ENCOUNTER — Telehealth: Payer: Self-pay

## 2018-06-22 VITALS — BP 135/90 | HR 86 | Temp 97.9°F | Resp 18 | Ht 72.0 in | Wt 219.9 lb

## 2018-06-22 DIAGNOSIS — I10 Essential (primary) hypertension: Secondary | ICD-10-CM | POA: Insufficient documentation

## 2018-06-22 DIAGNOSIS — Z923 Personal history of irradiation: Secondary | ICD-10-CM | POA: Diagnosis not present

## 2018-06-22 DIAGNOSIS — Z86010 Personal history of colonic polyps: Secondary | ICD-10-CM | POA: Diagnosis not present

## 2018-06-22 DIAGNOSIS — R110 Nausea: Secondary | ICD-10-CM | POA: Diagnosis not present

## 2018-06-22 DIAGNOSIS — C7A021 Malignant carcinoid tumor of the cecum: Secondary | ICD-10-CM | POA: Diagnosis not present

## 2018-06-22 DIAGNOSIS — C7B02 Secondary carcinoid tumors of liver: Secondary | ICD-10-CM | POA: Diagnosis not present

## 2018-06-22 DIAGNOSIS — R5383 Other fatigue: Secondary | ICD-10-CM | POA: Diagnosis not present

## 2018-06-22 DIAGNOSIS — Z79899 Other long term (current) drug therapy: Secondary | ICD-10-CM | POA: Diagnosis not present

## 2018-06-22 DIAGNOSIS — D509 Iron deficiency anemia, unspecified: Secondary | ICD-10-CM | POA: Insufficient documentation

## 2018-06-22 DIAGNOSIS — R55 Syncope and collapse: Secondary | ICD-10-CM

## 2018-06-22 DIAGNOSIS — C7A098 Malignant carcinoid tumors of other sites: Secondary | ICD-10-CM

## 2018-06-22 LAB — CBC WITH DIFFERENTIAL (CANCER CENTER ONLY)
BASOS ABS: 0 10*3/uL (ref 0.0–0.1)
Basophils Relative: 1 %
EOS PCT: 2 %
Eosinophils Absolute: 0.1 10*3/uL (ref 0.0–0.5)
HCT: 40.9 % (ref 38.4–49.9)
HEMOGLOBIN: 13.1 g/dL (ref 13.0–17.1)
LYMPHS ABS: 0.9 10*3/uL (ref 0.9–3.3)
LYMPHS PCT: 17 %
MCH: 27.5 pg (ref 27.2–33.4)
MCHC: 32.1 g/dL (ref 32.0–36.0)
MCV: 85.6 fL (ref 79.3–98.0)
Monocytes Absolute: 0.7 10*3/uL (ref 0.1–0.9)
Monocytes Relative: 14 %
NEUTROS PCT: 66 %
Neutro Abs: 3.4 10*3/uL (ref 1.5–6.5)
Platelet Count: 118 10*3/uL — ABNORMAL LOW (ref 140–400)
RBC: 4.78 MIL/uL (ref 4.20–5.82)
RDW: 16 % — ABNORMAL HIGH (ref 11.0–14.6)
WBC Count: 5.2 10*3/uL (ref 4.0–10.3)

## 2018-06-22 LAB — CMP (CANCER CENTER ONLY)
ALK PHOS: 141 U/L — AB (ref 38–126)
ALT: 19 U/L (ref 0–44)
ANION GAP: 8 (ref 5–15)
AST: 21 U/L (ref 15–41)
Albumin: 3.8 g/dL (ref 3.5–5.0)
BILIRUBIN TOTAL: 0.5 mg/dL (ref 0.3–1.2)
BUN: 14 mg/dL (ref 8–23)
CALCIUM: 9.2 mg/dL (ref 8.9–10.3)
CO2: 28 mmol/L (ref 22–32)
Chloride: 106 mmol/L (ref 98–111)
Creatinine: 0.97 mg/dL (ref 0.61–1.24)
GLUCOSE: 149 mg/dL — AB (ref 70–99)
Potassium: 3.3 mmol/L — ABNORMAL LOW (ref 3.5–5.1)
Sodium: 142 mmol/L (ref 135–145)
TOTAL PROTEIN: 6.8 g/dL (ref 6.5–8.1)

## 2018-06-22 MED ORDER — OCTREOTIDE ACETATE 20 MG IM KIT
20.0000 mg | PACK | Freq: Once | INTRAMUSCULAR | Status: AC
  Administered 2018-06-22: 20 mg via INTRAMUSCULAR

## 2018-06-22 MED ORDER — POTASSIUM CHLORIDE CRYS ER 20 MEQ PO TBCR: EXTENDED_RELEASE_TABLET | ORAL | 5 refills | Status: DC

## 2018-06-22 NOTE — Progress Notes (Signed)
[place] OFFICE PROGRESS NOTE   Diagnosis: Carcinoid tumor  INTERVAL HISTORY:   Tommy Conley returns as scheduled.  He completed a second treatment with Lutathera on 05/25/2018.  He reports mild nausea and decreased energy for approximately 3 days following treatment.  His energy level is now improved.  His stools are firmer.  No other complaint.  Objective:  Vital signs in last 24 hours:  Blood pressure 135/90, pulse 86, temperature 97.9 F (36.6 C), temperature source Oral, resp. rate 18, height 6' (1.829 m), weight 219 lb 14.4 oz (99.7 kg), SpO2 99 %.   Resp: End inspiratory rhonchi at the posterior bases bilaterally, no respiratory distress Cardio: Regular rate and rhythm GI: Mass in the right upper abdomen without a discrete liver edge, nontender Vascular: No leg edema     Lab Results:  Lab Results  Component Value Date   WBC 5.2 06/22/2018   HGB 13.1 06/22/2018   HCT 40.9 06/22/2018   MCV 85.6 06/22/2018   PLT 118 (L) 06/22/2018   NEUTROABS 3.4 06/22/2018    CMP  Lab Results  Component Value Date   NA 142 06/22/2018   K 3.3 (L) 06/22/2018   CL 106 06/22/2018   CO2 28 06/22/2018   GLUCOSE 149 (H) 06/22/2018   BUN 14 06/22/2018   CREATININE 0.97 06/22/2018   CALCIUM 9.2 06/22/2018   PROT 6.8 06/22/2018   ALBUMIN 3.8 06/22/2018   AST 21 06/22/2018   ALT 19 06/22/2018   ALKPHOS 141 (H) 06/22/2018   BILITOT 0.5 06/22/2018   GFRNONAA >60 06/22/2018   GFRAA >60 06/22/2018     Medications: I have reviewed the patient's current medications.   Assessment/Plan: 1. 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  1. Status post a right colon/ileum resection on 09/29/2011.  2. Wedge biopsy of a right liver lesion on 09/29/2011 confirmed metastatic well-differentiated neuroendocrine carcinoma.  3. CT of the abdomen 08/25/2011 consistent with multiple liver metastases.   4. Elevated preoperative chromogranin A level and a 24-hour urine 5-HIAA level. Stable on repeat 11/16/2011. 5. Restaging CT on 12/28/2011 confirmed slight progression of the metastatic liver lesions. 6. Status post 3 cycles of Xeloda/temozolomide with cycle 3 initiated on 03/12/2012 7. Restaging CT of the abdomen 04/04/2012 revealed slight progression of the metastatic liver lesions and transverse mesocolon adenopathy. 8. Status post Y-90 radioembolization of the right liver on 05/19/2012. 9. Status post Y-90 radioembolization of the left liver on 07/06/2012. 10. Chromogranin A level decreased on 08/29/2012, 24-hour urine 5 HIAA slightly increased on 09/05/2012 11. Restaging CT on 12/05/2012 with a mixed response in the liver 12. Restaging CT 08/11/2013-mild increase in the size of liver lesions. 13. Restaging CT 01/22/2014-stable to slightly improved liver lesions, mild increase in mesenteric adenopathy. Chromogranin A level stable. 14. Chromogranin A level stable 05/01/2014. 15. Chromogranin A higher on 08/02/2014 16. Restaging CT 11/06/2014 with interval enlargement of the enhancing lesions within the left and right hepatic lobe. 17. Initiation of monthly Sandostatin 12/24/2014 18. Status post Y-90 radioembolization of the right liver on 01/03/2015 19. Chromogranin A level stable 02/18/2015 20. Chromogranin A level improved 08/12/2015 21. Restaging CT 09/11/2015 with mild increase in the size of the dominant liver metastasis in the right hepatic lobe. Other smaller liver metastases showed no significant change. Stable mild lymphadenopathy in central small bowel mesentery and right cardiophrenic angle. Stable subcm right middle lobe pulmonary nodule. 22. Monthly Sandostatin continued 23. Gallium-DOTATATE01/25/2018-increased activity of  a dominant right liver mass and left liver lesion. Increased tracer accumulation in a mesenteric mass 24. Cycle 1 Lutathera 03/30/2018 25. Cycle 2 Lutathera  05/25/2018 2. History of diarrhea (3 to 4 times per day), predating surgery for 8 months. Decrease in daily bowel movements coinciding with discontinuation of Nexium. 3. Hypertension. 4. History of gastroesophageal reflux disease. No reflux symptoms since discontinuing Nexium. 5. Rectal and ascending colon polyps. He underwent a colonoscopy on 12/20/2012 with findings of a sessile polyp in the ascending colon and a sessile polyp in the rectum. Pathology showed hyperplastic polyps. Prior right colon resection noted with a normal appearing ileocolonic anastomosis. Small internal hemorrhoids noted. Repeat colonoscopy in 5 years. 6. History of Iron deficiency anemia 7. 08/12/2016 syncopal episode status post evaluation in the emergency department   Disposition: Mr. Pink appears stable.  He has completed 2 treatments with Lutathera.  He has tolerated the treatment well.  He will receive Sandostatin today.  He will increase the potassium supplement to twice daily.  He is scheduled for cycle 3 Lutathera on 07/20/2018.  He will return for an office visit and Sandostatin on 08/17/2018.  We will check the chromogranin A level when he is here on 08/17/2018. 15 minutes were spent with the patient today.  The majority of the time was used for counseling and coordination of care.  Betsy Coder, MD  06/22/2018  11:32 AM

## 2018-06-22 NOTE — Telephone Encounter (Signed)
Printed avs and calender of upcoming appointment. Per 7/3 los 

## 2018-06-22 NOTE — Patient Instructions (Signed)
Octreotide injection solution °What is this medicine? °OCTREOTIDE (ok TREE oh tide) is used to reduce blood levels of growth hormone in patients with a condition called acromegaly. This medicine also reduces flushing and watery diarrhea caused by certain types of cancer. °This medicine may be used for other purposes; ask your health care provider or pharmacist if you have questions. °COMMON BRAND NAME(S): Sandostatin, Sandostatin LAR °What should I tell my health care provider before I take this medicine? °They need to know if you have any of these conditions: °-gallbladder disease °-kidney disease °-liver disease °-an unusual or allergic reaction to octreotide, other medicines, foods, dyes, or preservatives °-pregnant or trying to get pregnant °-breast-feeding °How should I use this medicine? °This medicine is for injection under the skin or into a vein (only in emergency situations). It is usually given by a health care professional in a hospital or clinic setting. °If you get this medicine at home, you will be taught how to prepare and give this medicine. Allow the injection solution to come to room temperature before use. Do not warm it artificially. Use exactly as directed. Take your medicine at regular intervals. Do not take your medicine more often than directed. °It is important that you put your used needles and syringes in a special sharps container. Do not put them in a trash can. If you do not have a sharps container, call your pharmacist or healthcare provider to get one. °Talk to your pediatrician regarding the use of this medicine in children. Special care may be needed. °Overdosage: If you think you have taken too much of this medicine contact a poison control center or emergency room at once. °NOTE: This medicine is only for you. Do not share this medicine with others. °What if I miss a dose? °If you miss a dose, take it as soon as you can. If it is almost time for your next dose, take only that  dose. Do not take double or extra doses. °What may interact with this medicine? °Do not take this medicine with any of the following medications: °-cisapride °-droperidol °-general anesthetics °-grepafloxacin °-perphenazine °-thioridazine °This medicine may also interact with the following medications: °-bromocriptine °-cyclosporine °-diuretics °-medicines for blood pressure, heart disease, irregular heart beat °-medicines for diabetes, including insulin °-quinidine °This list may not describe all possible interactions. Give your health care provider a list of all the medicines, herbs, non-prescription drugs, or dietary supplements you use. Also tell them if you smoke, drink alcohol, or use illegal drugs. Some items may interact with your medicine. °What should I watch for while using this medicine? °Visit your doctor or health care professional for regular checks on your progress. °To help reduce irritation at the injection site, use a different site for each injection and make sure the solution is at room temperature before use. °This medicine may cause increases or decreases in blood sugar. Signs of high blood sugar include frequent urination, unusual thirst, flushed or dry skin, difficulty breathing, drowsiness, stomach ache, nausea, vomiting or dry mouth. Signs of low blood sugar include chills, cool, pale skin or cold sweats, drowsiness, extreme hunger, fast heartbeat, headache, nausea, nervousness or anxiety, shakiness, trembling, unsteadiness, tiredness, or weakness. Contact your doctor or health care professional right away if you experience any of these symptoms. °What side effects may I notice from receiving this medicine? °Side effects that you should report to your doctor or health care professional as soon as possible: °-allergic reactions like skin rash, itching or hives, swelling   of the face, lips, or tongue °-changes in blood sugar °-changes in heart rate °-severe stomach pain °Side effects that  usually do not require medical attention (report to your doctor or health care professional if they continue or are bothersome): °-diarrhea or constipation °-gas or stomach pain °-nausea, vomiting °-pain, redness, swelling and irritation at site where injected °This list may not describe all possible side effects. Call your doctor for medical advice about side effects. You may report side effects to FDA at 1-800-FDA-1088. °Where should I keep my medicine? °Keep out of the reach of children. °Store in a refrigerator between 2 and 8 degrees C (36 and 46 degrees F). Protect from light. Allow to come to room temperature naturally. Do not use artificial heat. If protected from light, the injection may be stored at room temperature between 20 and 30 degrees C (70 and 86 degrees F) for 14 days. After the initial use, throw away any unused portion of a multiple dose vial after 14 days. Throw away unused portions of the ampules after use. °NOTE: This sheet is a summary. It may not cover all possible information. If you have questions about this medicine, talk to your doctor, pharmacist, or health care provider. °© 2017 Elsevier/Gold Standard (2008-07-03 16:56:04) ° °

## 2018-06-29 ENCOUNTER — Other Ambulatory Visit: Payer: Self-pay | Admitting: Family Medicine

## 2018-07-04 ENCOUNTER — Other Ambulatory Visit: Payer: Self-pay | Admitting: Nurse Practitioner

## 2018-07-04 DIAGNOSIS — C7A021 Malignant carcinoid tumor of the cecum: Secondary | ICD-10-CM

## 2018-07-20 ENCOUNTER — Encounter (HOSPITAL_COMMUNITY)
Admission: RE | Admit: 2018-07-20 | Discharge: 2018-07-20 | Disposition: A | Discharge status: Home / Self Care | Payer: Medicare Other | Source: Ambulatory Visit | Attending: Nurse Practitioner | Admitting: Nurse Practitioner

## 2018-07-20 ENCOUNTER — Other Ambulatory Visit (HOSPITAL_COMMUNITY): Payer: Medicare Other

## 2018-07-20 DIAGNOSIS — C7A1 Malignant poorly differentiated neuroendocrine tumors: Secondary | ICD-10-CM | POA: Diagnosis not present

## 2018-07-20 DIAGNOSIS — C7A021 Malignant carcinoid tumor of the cecum: Secondary | ICD-10-CM | POA: Insufficient documentation

## 2018-07-20 LAB — COMPREHENSIVE METABOLIC PANEL
ALT: 20 U/L (ref 0–44)
AST: 28 U/L (ref 15–41)
Albumin: 3.9 g/dL (ref 3.5–5.0)
Alkaline Phosphatase: 120 U/L (ref 38–126)
Anion gap: 10 (ref 5–15)
BUN: 13 mg/dL (ref 8–23)
CALCIUM: 9.1 mg/dL (ref 8.9–10.3)
CHLORIDE: 105 mmol/L (ref 98–111)
CO2: 27 mmol/L (ref 22–32)
CREATININE: 0.9 mg/dL (ref 0.61–1.24)
Glucose, Bld: 176 mg/dL — ABNORMAL HIGH (ref 70–99)
Potassium: 2.9 mmol/L — ABNORMAL LOW (ref 3.5–5.1)
Sodium: 142 mmol/L (ref 135–145)
Total Bilirubin: 0.8 mg/dL (ref 0.3–1.2)
Total Protein: 7.2 g/dL (ref 6.5–8.1)

## 2018-07-20 LAB — CBC WITH DIFFERENTIAL/PLATELET
BASOS ABS: 0 10*3/uL (ref 0.0–0.1)
BASOS PCT: 0 %
EOS ABS: 0.1 10*3/uL (ref 0.0–0.7)
EOS PCT: 2 %
HCT: 38.9 % — ABNORMAL LOW (ref 39.0–52.0)
HEMOGLOBIN: 12.9 g/dL — AB (ref 13.0–17.0)
LYMPHS ABS: 1.1 10*3/uL (ref 0.7–4.0)
Lymphocytes Relative: 20 %
MCH: 28.4 pg (ref 26.0–34.0)
MCHC: 33.2 g/dL (ref 30.0–36.0)
MCV: 85.7 fL (ref 78.0–100.0)
Monocytes Absolute: 0.5 10*3/uL (ref 0.1–1.0)
Monocytes Relative: 10 %
Neutro Abs: 3.5 10*3/uL (ref 1.7–7.7)
Neutrophils Relative %: 68 %
PLATELETS: 144 10*3/uL — AB (ref 150–400)
RBC: 4.54 MIL/uL (ref 4.22–5.81)
RDW: 15.4 % (ref 11.5–15.5)
WBC: 5.1 10*3/uL (ref 4.0–10.5)

## 2018-07-20 MED ORDER — OCTREOTIDE ACETATE 500 MCG/ML IJ SOLN
INTRAMUSCULAR | Status: AC
  Filled 2018-07-20: qty 1

## 2018-07-20 MED ORDER — AMINO ACID RADIOPROTECTANT - L-LYSINE 2.5%/L-ARGININE 2.5% IN NS
250.0000 mL/h | INTRAVENOUS | Status: AC
  Administered 2018-07-20: 250 mL/h via INTRAVENOUS
  Filled 2018-07-20: qty 1000

## 2018-07-20 MED ORDER — SODIUM CHLORIDE 0.9 % IV SOLN: 500.0000 mL | Freq: Once | INTRAVENOUS | Status: DC

## 2018-07-20 MED ORDER — OCTREOTIDE ACETATE 500 MCG/ML IJ SOLN: 500.0000 ug | Freq: Once | INTRAMUSCULAR | Status: DC | PRN

## 2018-07-20 MED ORDER — OCTREOTIDE ACETATE 30 MG IM KIT
30.0000 mg | PACK | Freq: Once | INTRAMUSCULAR | Status: AC
  Administered 2018-07-20: 30 mg via INTRAMUSCULAR

## 2018-07-20 MED ORDER — OCTREOTIDE ACETATE 30 MG IM KIT
PACK | INTRAMUSCULAR | Status: AC
  Filled 2018-07-20: qty 1

## 2018-07-20 MED ORDER — ONDANSETRON HCL 8 MG PO TABS: 8.0000 mg | ORAL_TABLET | Freq: Two times a day (BID) | ORAL | 0 refills | Status: DC | PRN

## 2018-07-20 MED ORDER — PROCHLORPERAZINE EDISYLATE 10 MG/2ML IJ SOLN
10.0000 mg | Freq: Four times a day (QID) | INTRAMUSCULAR | Status: DC | PRN
  Filled 2018-07-20 (×2): qty 2

## 2018-07-20 MED ORDER — LUTETIUM LU 177 DOTATATE 370 MBQ/ML IV SOLN
200.0000 | Freq: Once | INTRAVENOUS | Status: AC
  Administered 2018-07-20: 204.3 via INTRAVENOUS

## 2018-07-20 MED ORDER — SODIUM CHLORIDE 0.9 % IV SOLN
8.0000 mg | Freq: Once | INTRAVENOUS | Status: AC
  Administered 2018-07-20: 8 mg via INTRAVENOUS
  Filled 2018-07-20: qty 4

## 2018-07-20 NOTE — Progress Notes (Signed)
RADIOPHARMACEUTICALS:  [204.3] mCi Lu 177 DOTATATE  FINDINGS: Diagnosis: [Metastatic neuroendocrine tumor.]  Current Infusion: [3]  Planned Infusions: [4]  Patient reports [minimal] interval symptoms following therapy.  Some improved energy.  Some improvement in bowel consistency.  Decreased chromogranin A levels.  The patient's most recent blood counts were reviewed and remains a good candidate to proceed with Lutathera. The patient was situated in an infusion suite and administered Lutathera as above. Patient will follow-up with referring oncologist for interval serum laboratories (CBC and CMP) in approximately 4 weeks.    Patient received 30 mg IM long-acting Sandostatin injection 4 hours after Lutathera effusion in the nuclear medicine department.   IMPRESSION: [Third] VJ 282 SUORVIFB treatment for metastatic neuroendocrine tumor. The patient tolerated the infusion well. The patient will return in 8 weeks for ongoing care.  Improved energy and some improved bowel consistency.  Decreased chromogranin A level.

## 2018-07-26 ENCOUNTER — Other Ambulatory Visit: Payer: Self-pay | Admitting: Family Medicine

## 2018-08-01 ENCOUNTER — Other Ambulatory Visit: Payer: Self-pay | Admitting: Diagnostic Radiology

## 2018-08-06 ENCOUNTER — Other Ambulatory Visit: Payer: Self-pay | Admitting: Family Medicine

## 2018-08-08 NOTE — Telephone Encounter (Signed)
LAST SEEN 03/2018

## 2018-08-17 ENCOUNTER — Inpatient Hospital Stay: Payer: Medicare Other | Attending: Oncology | Admitting: Nurse Practitioner

## 2018-08-17 ENCOUNTER — Inpatient Hospital Stay: Payer: Medicare Other

## 2018-08-23 ENCOUNTER — Other Ambulatory Visit: Payer: Self-pay | Admitting: Family Medicine

## 2018-09-11 ENCOUNTER — Other Ambulatory Visit: Payer: Self-pay | Admitting: Family Medicine

## 2018-09-13 MED ORDER — OCTREOTIDE ACETATE 500 MCG/ML IJ SOLN: 500.0000 ug | Freq: Once | INTRAMUSCULAR | Status: DC | PRN

## 2018-09-13 MED ORDER — OCTREOTIDE ACETATE 30 MG IM KIT
30.0000 mg | PACK | Freq: Once | INTRAMUSCULAR | Status: AC
  Administered 2018-09-14: 30 mg via INTRAMUSCULAR

## 2018-09-13 MED ORDER — LUTETIUM LU 177 DOTATATE 370 MBQ/ML IV SOLN
200.0000 | Freq: Once | INTRAVENOUS | Status: AC
  Administered 2018-09-13: 205 via INTRAVENOUS

## 2018-09-13 MED ORDER — SODIUM CHLORIDE 0.9 % IV SOLN: 500.0000 mL | Freq: Once | INTRAVENOUS | Status: DC

## 2018-09-13 MED ORDER — ONDANSETRON HCL 40 MG/20ML IJ SOLN
8.0000 mg | Freq: Once | INTRAMUSCULAR | Status: AC
  Administered 2018-09-14: 8 mg via INTRAVENOUS
  Filled 2018-09-13: qty 4

## 2018-09-13 MED ORDER — PROCHLORPERAZINE EDISYLATE 10 MG/2ML IJ SOLN
10.0000 mg | Freq: Four times a day (QID) | INTRAMUSCULAR | Status: DC | PRN
  Filled 2018-09-13: qty 2

## 2018-09-13 MED ORDER — AMINO ACID RADIOPROTECTANT - L-LYSINE 2.5%/L-ARGININE 2.5% IN NS
250.0000 mL/h | INTRAVENOUS | Status: AC
  Administered 2018-09-14: 250 mL/h via INTRAVENOUS
  Filled 2018-09-13: qty 1000

## 2018-09-14 ENCOUNTER — Other Ambulatory Visit (HOSPITAL_COMMUNITY): Payer: Medicare Other

## 2018-09-14 ENCOUNTER — Ambulatory Visit (HOSPITAL_COMMUNITY)
Admission: RE | Admit: 2018-09-14 | Discharge: 2018-09-14 | Disposition: A | Discharge status: Home / Self Care | Payer: Medicare Other | Source: Ambulatory Visit | Attending: Nurse Practitioner | Admitting: Nurse Practitioner

## 2018-09-14 DIAGNOSIS — C7A021 Malignant carcinoid tumor of the cecum: Secondary | ICD-10-CM | POA: Diagnosis not present

## 2018-09-14 DIAGNOSIS — C7A1 Malignant poorly differentiated neuroendocrine tumors: Secondary | ICD-10-CM | POA: Diagnosis not present

## 2018-09-14 LAB — COMPREHENSIVE METABOLIC PANEL
ALK PHOS: 109 U/L (ref 38–126)
ALT: 16 U/L (ref 0–44)
AST: 21 U/L (ref 15–41)
Albumin: 3.8 g/dL (ref 3.5–5.0)
Anion gap: 10 (ref 5–15)
BILIRUBIN TOTAL: 0.7 mg/dL (ref 0.3–1.2)
BUN: 12 mg/dL (ref 8–23)
CALCIUM: 9.5 mg/dL (ref 8.9–10.3)
CO2: 28 mmol/L (ref 22–32)
CREATININE: 1.1 mg/dL (ref 0.61–1.24)
Chloride: 107 mmol/L (ref 98–111)
Glucose, Bld: 117 mg/dL — ABNORMAL HIGH (ref 70–99)
Potassium: 3.3 mmol/L — ABNORMAL LOW (ref 3.5–5.1)
Sodium: 145 mmol/L (ref 135–145)
TOTAL PROTEIN: 7.1 g/dL (ref 6.5–8.1)

## 2018-09-14 LAB — CBC
HCT: 38.8 % — ABNORMAL LOW (ref 39.0–52.0)
HEMOGLOBIN: 12.9 g/dL — AB (ref 13.0–17.0)
MCH: 28.8 pg (ref 26.0–34.0)
MCHC: 33.2 g/dL (ref 30.0–36.0)
MCV: 86.6 fL (ref 78.0–100.0)
Platelets: 185 10*3/uL (ref 150–400)
RBC: 4.48 MIL/uL (ref 4.22–5.81)
RDW: 14.9 % (ref 11.5–15.5)
WBC: 5.6 10*3/uL (ref 4.0–10.5)

## 2018-09-14 MED ORDER — OCTREOTIDE ACETATE 500 MCG/ML IJ SOLN
INTRAMUSCULAR | Status: AC
  Filled 2018-09-14: qty 1

## 2018-09-14 MED ORDER — OCTREOTIDE ACETATE 30 MG IM KIT
PACK | INTRAMUSCULAR | Status: AC
  Filled 2018-09-14: qty 1

## 2018-09-14 NOTE — Progress Notes (Signed)
CLINICAL DATA: [age] year-old [male] with metastatic neuroendocrine tumor. Well differentiated tumor with somatostatin receptor is identified prominently within liver by DOTATATE PET CT scan. EXAM: NUCLEAR MEDICINE LUTATHERA ADMINISTRATION TECHNIQUE: Infusion: The nuclear medicine technologist and I personally verified the dose activity ([208] mCi) to be delivered as specified in the written directive (200 mCi), and verified the patient identification via 2 separate methods. 20 gauge IV were started in the antecubital veins. Anti-emetics were administered by nursing staff. Amino acid renal protection was initiated 30 minutes prior to Lu 177 DOTATATE (Lutathera) infusion and continued continuously for 4 hours. Lutathera infusion was administered over 30 minutes.   The total administered dose was [205] mCi Lu 177 DOTATATE.  The entire IV tubing, venocatheter, stopcock and syringes was removed in total, placed in a disposal bag and sent for assay of the residual activity, which will be reported at a later time in our EMR by the physics staff. Pressure was applied to the venipuncture sites, and a compression bandage placed. Radiation Safety personnel were present to perform the discharge survey, as detailed on their documentation.  Patient received 30 mg IM long-acting Sandostatin injection 4 hours after Lutathera effusion in the nuclear medicine department.  RADIOPHARMACEUTICALS:  [Two hundred five] mCi Lu 177 DOTATATE  FINDINGS: Diagnosis: [Metastatic neuroendocrine tumor.]  Current Infusion: [4]  Planned Infusions: [4]  Patient continues report improvement in energy and decrease in bowel symptoms with ongoing peptide receptor radiotherapy... The patient's most recent blood counts were reviewed and remains a good candidate to proceed with Lutathera. The patient was situated in an infusion suite and administered Lutathera as above. Patient will follow-up with referring oncologist  for serum laboratories (CBC and CMP) in approximately 5  weeks.    Patient received 30 mg IM long-acting Sandostatin injection 4 hours after Lutathera effusion in the nuclear medicine department.   IMPRESSION: [Fourth and final] XB 353 [id number] treatment for metastatic neuroendocrine tumor.  Patient reports improved symptomology.  The patient tolerated the infusion well. The patient will return in 4-5 weeks for DOTATATE PET scan and final consultation.

## 2018-09-19 LAB — CHROMOGRANIN A: CHROMOGRANIN A: 123 nmol/L — AB (ref 0–5)

## 2018-09-27 ENCOUNTER — Other Ambulatory Visit: Payer: Self-pay | Admitting: Family Medicine

## 2018-10-08 ENCOUNTER — Other Ambulatory Visit: Payer: Self-pay | Admitting: Family Medicine

## 2018-10-11 ENCOUNTER — Encounter: Payer: Self-pay | Admitting: Family Medicine

## 2018-10-11 ENCOUNTER — Ambulatory Visit (INDEPENDENT_AMBULATORY_CARE_PROVIDER_SITE_OTHER): Payer: Medicare Other | Admitting: Family Medicine

## 2018-10-11 VITALS — BP 154/92 | Ht 72.0 in | Wt 219.1 lb

## 2018-10-11 DIAGNOSIS — N400 Enlarged prostate without lower urinary tract symptoms: Secondary | ICD-10-CM | POA: Diagnosis not present

## 2018-10-11 DIAGNOSIS — C7A098 Malignant carcinoid tumors of other sites: Secondary | ICD-10-CM | POA: Diagnosis not present

## 2018-10-11 DIAGNOSIS — K588 Other irritable bowel syndrome: Secondary | ICD-10-CM

## 2018-10-11 DIAGNOSIS — I10 Essential (primary) hypertension: Secondary | ICD-10-CM

## 2018-10-11 MED ORDER — HYOSCYAMINE SULFATE ER 0.375 MG PO TB12: 0.3750 mg | ORAL_TABLET | Freq: Two times a day (BID) | ORAL | 5 refills | Status: DC

## 2018-10-11 MED ORDER — AMLODIPINE BESYLATE 10 MG PO TABS: 10.0000 mg | ORAL_TABLET | Freq: Every day | ORAL | 5 refills | Status: DC

## 2018-10-11 MED ORDER — METOPROLOL SUCCINATE ER 50 MG PO TB24: ORAL_TABLET | ORAL | 5 refills | Status: DC

## 2018-10-11 MED ORDER — HYDRALAZINE HCL 50 MG PO TABS: 50.0000 mg | ORAL_TABLET | Freq: Three times a day (TID) | ORAL | 1 refills | Status: DC

## 2018-10-11 MED ORDER — HYDROCHLOROTHIAZIDE 25 MG PO TABS: ORAL_TABLET | ORAL | 5 refills | Status: DC

## 2018-10-11 MED ORDER — LOSARTAN POTASSIUM 100 MG PO TABS: ORAL_TABLET | ORAL | 5 refills | Status: DC

## 2018-10-11 NOTE — Progress Notes (Signed)
   Subjective:    Patient ID: Tommy Conley, male    DOB: Jan 25, 1951, 67 y.o.   MRN: [id number]  HPI Patient is here today to follow up on chronic health issues. He does see an oncologist. He eats healthy, and exercises.  Blood pressure medicine and blood pressure levels reviewed today with patient. Compliant with blood pressure medicine. States does not miss a dose. No obvious side effects. Blood pressure generally good when checked elsewhere. Watching salt intake.   Took all the therapy for the caricinoid , pt due to have scan soon   Exercise overall going pretty good   Prost hypertorphy much beter.  Tolerating the Fosamax well.  Is definitely helps.  Still has challenges with IBS.  Time he tries to come off the medication needs to resume.   Review of Systems No headache, no major weight loss or weight gain, no chest pain no back pain abdominal pain no change in bowel habits complete ROS otherwise negative     Objective:   Physical Exam   Alert and oriented, vitals reviewed and stable, NAD ENT-TM's and ext canals WNL bilat via otoscopic exam Soft palate, tonsils and post pharynx WNL via oropharyngeal exam Neck-symmetric, no masses; thyroid nonpalpable and nontender Pulmonary-no tachypnea or accessory muscle use; Clear without wheezes via auscultation Card--no abnrml murmurs, rhythm reg and rate WNL Carotid pulses symmetric, without bruits       Assessment & Plan:  Impression 1 hypertension.  Discussed to maintain same meds rationale discussed  2.  Prostate hypertrophy.  Responding well to Flomax.  Maintain same warning signs discussed  3.  Erectile dysfunction.  Ongoing.  Medication self Viagra maintained.  Proper use discussed 4 IBS.  Responding well to meds  5.  Carcinoid tumor await follow-up scan next week.  Discussed.  Encouraged to go to pharmacy for high-dose flu vaccine with patient's ongoing cancer therapy.  Follow-up in 6 months for wellness plus  chronic visit

## 2018-10-13 ENCOUNTER — Other Ambulatory Visit: Payer: Self-pay | Admitting: Family Medicine

## 2018-10-18 ENCOUNTER — Ambulatory Visit (HOSPITAL_COMMUNITY)
Admission: RE | Admit: 2018-10-18 | Discharge: 2018-10-18 | Disposition: A | Discharge status: Home / Self Care | Payer: Medicare Other | Source: Ambulatory Visit | Attending: Nurse Practitioner | Admitting: Nurse Practitioner

## 2018-10-18 DIAGNOSIS — C7A021 Malignant carcinoid tumor of the cecum: Secondary | ICD-10-CM | POA: Insufficient documentation

## 2018-10-18 DIAGNOSIS — C786 Secondary malignant neoplasm of retroperitoneum and peritoneum: Secondary | ICD-10-CM | POA: Diagnosis not present

## 2018-10-18 DIAGNOSIS — C787 Secondary malignant neoplasm of liver and intrahepatic bile duct: Secondary | ICD-10-CM | POA: Diagnosis not present

## 2018-10-18 DIAGNOSIS — C7A1 Malignant poorly differentiated neuroendocrine tumors: Secondary | ICD-10-CM | POA: Diagnosis not present

## 2018-10-18 DIAGNOSIS — Z9289 Personal history of other medical treatment: Secondary | ICD-10-CM | POA: Diagnosis not present

## 2018-10-18 MED ORDER — GALLIUM GA 68 DOTATATE IV KIT
5.0000 | PACK | Freq: Once | INTRAVENOUS | Status: AC
  Administered 2018-10-18: 5 via INTRAVENOUS

## 2018-10-18 NOTE — Consult Note (Signed)
Chief Complaint: Patient with metastatic neuroendocrine tumor post completion of 4 cycles Peptide receptor radiotherapy (PRRT) with XF818 DOTATATE (Lutathera).  Referring Physician(s):Sherrill    Patient Status: [facility]  History of Present Illness: Tommy Conley is a 67 y.o. male Metastatic neuroendocrine tumor.  Original lesion discovered in the cecum.  Patient status post RIGHT hemicolectomy.  Metastatic disease to the RIGHT hepatic lobe (large lesion) and small lesion RIGHT hepatic lobe.  Central mesenteric metastasis.  Patient has undergone 2 yttrium 90 hepatic artery directed radio embolizations.  Patient completed 4 cycles of peptide receptor radiotherapy on 09/14/2018.     Past Medical History:  Diagnosis Date  . Allergy   . Barrett's esophagus   . Carcinoid tumor of cecum   . Colon polyp   . GERD (gastroesophageal reflux disease)   . Heart murmur   . Hypertension   . IBS (irritable bowel syndrome)   . Malignant carcinoid tumors of other sites ([place]) 08/25/2016  . Perennial allergic rhinitis     Past Surgical History:  Procedure Laterality Date  . colectomy  09/29/11   lap - right with liver bx  . COLON SURGERY    . COLONOSCOPY  07/2011  . UPPER GASTROINTESTINAL ENDOSCOPY     hx barrett's esophagus  . WEDGE LIVER BIOPSY  09/29/11   right liver lesion    Allergies: Vasotec [enalapril]  Medications: Prior to Admission medications   Medication Sig Start Date End Date Taking? Authorizing Provider  amLODipine (NORVASC) 10 MG tablet Take 1 tablet (10 mg total) by mouth daily. 10/11/18   Mikey Kirschner, MD  dicyclomine (BENTYL) 20 MG tablet TAKE 1 TABLET BY MOUTH THREE TIMES DAILY AS NEEDED FOR ABDOMINAL CRAMPS 10/14/18   Mikey Kirschner, MD  hydrALAZINE (APRESOLINE) 50 MG tablet Take 1 tablet (50 mg total) by mouth 3 (three) times daily. 10/11/18   Mikey Kirschner, MD  hydrochlorothiazide (HYDRODIURIL) 25 MG tablet TAKE 1/2 TABLETS BY MOUTH EVERY  MORNING 10/11/18   Mikey Kirschner, MD  hyoscyamine (LEVBID) 0.375 MG 12 hr tablet Take 1 tablet (0.375 mg total) by mouth 2 (two) times daily. 10/11/18   Mikey Kirschner, MD  losartan (COZAAR) 100 MG tablet TAKE 1 TABLET(100 MG) BY MOUTH AT BEDTIME 10/11/18   Mikey Kirschner, MD  metoprolol succinate (TOPROL-XL) 50 MG 24 hr tablet TAKE 1 TABLET BY MOUTH DAILY. TAKE WITH OR IMMEDIATELY FOLLOWING MEALS 10/11/18   Mikey Kirschner, MD  ondansetron (ZOFRAN) 8 MG tablet Take 1 tablet (8 mg total) by mouth 2 (two) times daily as needed for nausea or vomiting. 07/20/18   Gus Height, MD  potassium chloride SA (K-DUR,KLOR-CON) 20 MEQ tablet TAKE 1 TABLET(20 MEQ) BY MOUTH twice  DAILY 06/22/18   Ladell Pier, MD  pramoxine-hydrocortisone (PROCTOCREAM-HC) 1-1 % rectal cream Place 1 application rectally 3 (three) times daily. Patient not taking: Reported on 10/11/2018 04/29/16   Mikey Kirschner, MD  prochlorperazine (COMPAZINE) 5 MG tablet Take 1 tablet (5 mg total) by mouth every 6 (six) hours as needed for nausea or vomiting. 04/28/18   Owens Shark, NP  PROCTOSOL HC 2.5 % rectal cream PLACE 1 APPLICATION RECTALLY 3 (THREE) TIMES DAILY. Patient not taking: Reported on 10/11/2018 11/10/17   Mikey Kirschner, MD  sildenafil (VIAGRA) 100 MG tablet Take 1 tablet (100 mg total) by mouth daily as needed for erectile dysfunction. 09/19/14   Mikey Kirschner, MD  tamsulosin (FLOMAX) 0.4 MG CAPS  capsule TAKE 1 CAPSULE(0.4 MG) BY MOUTH AT BEDTIME 07/27/18   Kathyrn Drown, MD     Family History  Problem Relation Age of Onset  . Hypertension Father   . Hyperlipidemia Father   . Heart attack Father   . Colon polyps Neg Hx   . Colon cancer Neg Hx   . Stomach cancer Neg Hx   . Cancer Neg Hx   . Esophageal cancer Neg Hx   . Pancreatic cancer Neg Hx   . Rectal cancer Neg Hx     Social History   Socioeconomic History  . Marital status: Legally Separated    Spouse name: Not on file  . Number of  children: Not on file  . Years of education: Not on file  . Highest education level: Not on file  Occupational History  . Not on file  Social Needs  . Financial resource strain: Not on file  . Food insecurity:    Worry: Not on file    Inability: Not on file  . Transportation needs:    Medical: Not on file    Non-medical: Not on file  Tobacco Use  . Smoking status: Light Tobacco Smoker    Packs/day: 0.25    Years: 17.00    Pack years: 4.25    Types: Cigarettes  . Smokeless tobacco: Never Used  . Tobacco comment: does not smoke every day  Substance and Sexual Activity  . Alcohol use: No  . Drug use: No  . Sexual activity: Not on file  Lifestyle  . Physical activity:    Days per week: Not on file    Minutes per session: Not on file  . Stress: Not on file  Relationships  . Social connections:    Talks on phone: Not on file    Gets together: Not on file    Attends religious service: Not on file    Active member of club or organization: Not on file    Attends meetings of clubs or organizations: Not on file    Relationship status: Not on file  Other Topics Concern  . Not on file  Social History Narrative  . Not on file    ECOG Status: 1 - Symptomatic but completely ambulatory  Review of Systems:  ROS discussed and pertinent positives are indicated in the HPI above.  All other systems are negative.  Review of Systems  Constitutional: Positive for fatigue (improved).  Gastrointestinal: Positive for abdominal pain (improved) and diarrhea (improved).  Genitourinary: Positive for difficulty urinating.  Neurological: Positive for weakness.  Psychiatric/Behavioral: Negative.     Vital Signs: There were no vitals taken for this visit.  Physical Exam  Constitutional: He appears well-developed.  HENT:  Head: Normocephalic.  Psychiatric: He has a normal mood and affect.    Imaging: Nm Pet (netspot Ga 68 Dotatate) Skull Base To Mid Thigh  Result Date:  03/23/2018 CLINICAL DATA:  Well differentiated neuroendocrine tumor with liver metastasis. Patient status post hepatic artery directed microspheres radiotherapy. Initial lesion in the cecum. Evaluation for Lu 177 DOTATATE peptide receptor therapy. EXAM: NUCLEAR MEDICINE PET SKULL BASE TO THIGH TECHNIQUE: 4.5 mCi Ga 5 DOTATATE was injected intravenously. Full-ring PET imaging was performed from the skull base to thigh after the radiotracer. CT data was obtained and used for attenuation correction and anatomic localization. COMPARISON:  DOTATATE PET-CT 01/14/2017, CT 09/11/2015 FINDINGS: NECK No radiotracer activity in neck lymph nodes. Incidental CT findings: None CHEST No radiotracer accumulation within mediastinal or hilar  lymph nodes. No suspicious pulmonary nodules on the CT scan. Incidental CT finding:None ABDOMEN/PELVIS Large lesion occupying the near entirety of the RIGHT hepatic lobe has avid radiotracer accumulation and is increased in size from comparison exam. Lesion (as measured by DOTATATE activity) measures 17 cm by 14 cm compared to 12 cm x 15 cm. Lesion remains avid for the DOTATATE radiotracer with SUV max 24 which is similar to SUV max 32 on prior. Additional solitary lesion in the LEFT hepatic lobe measures 2.5 cm not changed. No focal activity associated with the bowel. Lesion in the central mesentery just beneath the head of pancreas with SUV max equal 17 and not changed from comparison exam Physiologic activity noted in the liver, spleen, adrenal glands and kidneys. Incidental CT findings:Prostate gland is enlarged. SKELETON No focal activity to suggest skeletal metastasis. Incidental CT findings:None IMPRESSION: 1. Large metastatic lesion in the RIGHT hepatic lobe is increased in size from comparison exam and remains intensely avid for DOTATATE radiotracer. Findings consistent with disease progression. 2. Intense radiotracer activity associated with the central mesenteric mass in the upper  abdomen. Electronically Signed   By: Suzy Bouchard M.D.   On: 03/23/2018 09:43    Labs:  CBC: Recent Labs    05/25/18 0813 06/22/18 1040 07/20/18 0829 09/13/18 1030  WBC 5.4 5.2 5.1 5.6  HGB 12.1* 13.1 12.9* 12.9*  HCT 37.4* 40.9 38.9* 38.8*  PLT 151 118* 144* 185    COAGS: No results for input(s): INR, APTT in the last 8760 hours.  BMP: Recent Labs    05/25/18 0813 06/22/18 1040 07/20/18 0829 09/13/18 1030  NA 140 142 142 145  K 3.0* 3.3* 2.9* 3.3*  CL 106 106 105 107  CO2 26 28 27 28   GLUCOSE 170* 149* 176* 117*  BUN 13 14 13 12   CALCIUM 8.8* 9.2 9.1 9.5  CREATININE 0.93 0.97 0.90 1.10  GFRNONAA >60 >60 >60 >60  GFRAA >60 >60 >60 >60    LIVER FUNCTION TESTS: Recent Labs    05/25/18 0813 06/22/18 1040 07/20/18 0829 09/13/18 1030  BILITOT 0.5 0.5 0.8 0.7  AST 26 21 28 21   ALT 22 19 20 16   ALKPHOS 138* 141* 120 109  PROT 6.9 6.8 7.2 7.1  ALBUMIN 3.7 3.8 3.9 3.8    TUMOR MARKERS: No results for input(s): AFPTM, CEA, CA199, CHROMOGRNA in the last 8760 hours.   Chromogranin A = 123 (09/14/18) -- increased from most recent value 66 on 05/25/18 and similar to 136 on 05/05/16  Assessment and Plan:  1. Patient reports improved symptomology following peptide receptor radiotherapy with decreased fatigue, increase energy, and decreased bowel symptoms.  No abdominal pain.  2. Little interval change in follow-up DOTATATE PET-CT scan (10/18/2018).  Persistent large lesion in the RIGHT hepatic lobe, solitary LEFT hepatic lobe lesion and central mesenteric mass.  Of note peptide receptor radiotherapy may continue to have radiotoxic tumor effect  for several months following completion of therapy. Consider follow-up DOTATATE PET-CT scan in 6-12 months.  3. No evidence of new or progressive neuroendocrine tumor (primary goal of therapy).  4. Patient experienced no measurable toxicity with normal renal function, hepatic function and marrow function.  Recommend continued  follow-up monitoring of marrow function.  5. Most recent chromogranin A  slightly elevated.  Recommend monitoring.  6. Recommend continued Sandostatin monthly IM injections.    Thank you for this interesting consult.  I greatly enjoyed meeting Tommy Conley and look forward to participating in their  care.  A copy of this report was sent to the requesting provider on this date.  Electronically Signed: Rennis Golden, MD 10/18/2018, 1:51 PM   I spent a total of    15 Minutes in face to face in clinical consultation, greater than 50% of which was counseling/coordinating care for metastatic neuroendocrine tumor.

## 2018-10-27 ENCOUNTER — Telehealth: Payer: Self-pay | Admitting: Family Medicine

## 2018-10-27 DIAGNOSIS — H6120 Impacted cerumen, unspecified ear: Secondary | ICD-10-CM

## 2018-10-27 DIAGNOSIS — Z23 Encounter for immunization: Secondary | ICD-10-CM | POA: Diagnosis not present

## 2018-10-27 NOTE — Telephone Encounter (Signed)
Let's do 

## 2018-10-27 NOTE — Telephone Encounter (Signed)
Pt needs referral to ENT for ear irrigation  Pt states he was being fitted for a hearing aid & was told his ears were impacted   Please initiate referral in system so that I may process

## 2018-10-27 NOTE — Telephone Encounter (Signed)
Please advise. Thank you

## 2018-10-28 NOTE — Telephone Encounter (Signed)
Order placed in Epic and pt is aware.

## 2018-10-31 ENCOUNTER — Other Ambulatory Visit: Payer: Self-pay | Admitting: Family Medicine

## 2018-10-31 NOTE — Telephone Encounter (Signed)
Dr Mariane Duval!!

## 2018-11-01 ENCOUNTER — Encounter: Payer: Self-pay | Admitting: Family Medicine

## 2018-11-03 ENCOUNTER — Other Ambulatory Visit: Payer: Self-pay | Admitting: Family Medicine

## 2018-11-03 ENCOUNTER — Telehealth: Payer: Self-pay | Admitting: *Deleted

## 2018-11-03 NOTE — Telephone Encounter (Signed)
Patient called to follow up on when he will resume his [place]. Per Dr. Benay Spice, resume next week and repeat in 1 month with office visit. Patient notified and scheduling message sent.

## 2018-11-04 ENCOUNTER — Telehealth: Payer: Self-pay | Admitting: Oncology

## 2018-11-04 NOTE — Telephone Encounter (Signed)
Called regarding 11/18 °

## 2018-11-07 ENCOUNTER — Inpatient Hospital Stay: Payer: Medicare Other | Attending: Nurse Practitioner

## 2018-11-07 VITALS — BP 146/90 | HR 96 | Temp 98.4°F | Resp 18

## 2018-11-07 DIAGNOSIS — C7A021 Malignant carcinoid tumor of the cecum: Secondary | ICD-10-CM | POA: Insufficient documentation

## 2018-11-07 DIAGNOSIS — Z79899 Other long term (current) drug therapy: Secondary | ICD-10-CM | POA: Diagnosis not present

## 2018-11-07 MED ORDER — OCTREOTIDE ACETATE 30 MG IM KIT
PACK | INTRAMUSCULAR | Status: AC
  Filled 2018-11-07: qty 1

## 2018-11-07 MED ORDER — OCTREOTIDE ACETATE 20 MG IM KIT
PACK | INTRAMUSCULAR | Status: AC
  Filled 2018-11-07: qty 1

## 2018-11-07 MED ORDER — OCTREOTIDE ACETATE 30 MG IM KIT
30.0000 mg | PACK | Freq: Once | INTRAMUSCULAR | Status: AC
  Administered 2018-11-07: 30 mg via INTRAMUSCULAR

## 2018-11-07 NOTE — Patient Instructions (Signed)

## 2018-11-07 NOTE — Progress Notes (Signed)
Spoke w/ Dr. Benay Spice, update orders to Octreotide 30mg  (older order was in for 20mg ).   Demetrius Charity, PharmD, [place] [facility] Pharmacist Pharmacy Phone: [phone] 11/07/2018

## 2018-11-10 DIAGNOSIS — H9011 Conductive hearing loss, unilateral, right ear, with unrestricted hearing on the contralateral side: Secondary | ICD-10-CM | POA: Diagnosis not present

## 2018-11-10 DIAGNOSIS — H6121 Impacted cerumen, right ear: Secondary | ICD-10-CM | POA: Diagnosis not present

## 2018-11-10 DIAGNOSIS — F17200 Nicotine dependence, unspecified, uncomplicated: Secondary | ICD-10-CM | POA: Diagnosis not present

## 2018-11-19 ENCOUNTER — Other Ambulatory Visit: Payer: Self-pay | Admitting: Family Medicine

## 2018-11-24 DIAGNOSIS — H6121 Impacted cerumen, right ear: Secondary | ICD-10-CM | POA: Diagnosis not present

## 2018-11-28 ENCOUNTER — Other Ambulatory Visit: Payer: Self-pay | Admitting: Family Medicine

## 2018-11-30 ENCOUNTER — Other Ambulatory Visit: Payer: Self-pay | Admitting: Family Medicine

## 2018-12-07 ENCOUNTER — Encounter: Payer: Self-pay | Admitting: Nurse Practitioner

## 2018-12-07 ENCOUNTER — Inpatient Hospital Stay: Payer: Medicare Other | Attending: Nurse Practitioner | Admitting: Nurse Practitioner

## 2018-12-07 ENCOUNTER — Telehealth: Payer: Self-pay

## 2018-12-07 ENCOUNTER — Inpatient Hospital Stay: Payer: Medicare Other

## 2018-12-07 VITALS — BP 154/89 | HR 91 | Temp 98.2°F | Resp 18 | Ht 72.0 in | Wt 219.3 lb

## 2018-12-07 DIAGNOSIS — R197 Diarrhea, unspecified: Secondary | ICD-10-CM | POA: Insufficient documentation

## 2018-12-07 DIAGNOSIS — I10 Essential (primary) hypertension: Secondary | ICD-10-CM | POA: Diagnosis not present

## 2018-12-07 DIAGNOSIS — C787 Secondary malignant neoplasm of liver and intrahepatic bile duct: Secondary | ICD-10-CM | POA: Diagnosis not present

## 2018-12-07 DIAGNOSIS — R590 Localized enlarged lymph nodes: Secondary | ICD-10-CM | POA: Diagnosis not present

## 2018-12-07 DIAGNOSIS — K219 Gastro-esophageal reflux disease without esophagitis: Secondary | ICD-10-CM | POA: Diagnosis not present

## 2018-12-07 DIAGNOSIS — C7A021 Malignant carcinoid tumor of the cecum: Secondary | ICD-10-CM | POA: Insufficient documentation

## 2018-12-07 DIAGNOSIS — Z79899 Other long term (current) drug therapy: Secondary | ICD-10-CM | POA: Diagnosis not present

## 2018-12-07 DIAGNOSIS — C7989 Secondary malignant neoplasm of other specified sites: Secondary | ICD-10-CM | POA: Diagnosis not present

## 2018-12-07 MED ORDER — OCTREOTIDE ACETATE 30 MG IM KIT
PACK | INTRAMUSCULAR | Status: AC
  Filled 2018-12-07: qty 1

## 2018-12-07 MED ORDER — OCTREOTIDE ACETATE 30 MG IM KIT
30.0000 mg | PACK | Freq: Once | INTRAMUSCULAR | Status: AC
  Administered 2018-12-07: 30 mg via INTRAMUSCULAR

## 2018-12-07 NOTE — Telephone Encounter (Signed)
Printed avs and calender of upcoming appointment. Per 12/18 los 

## 2018-12-07 NOTE — Progress Notes (Signed)
[place] OFFICE PROGRESS NOTE   Diagnosis: Carcinoid tumor  INTERVAL HISTORY:   [name] returns as scheduled.  He feels well.  He states that he feels significantly better than he did prior to receiving the Lutathera treatments.  He has a good appetite.  No significant abdominal pain.  No diarrhea.  No flushing episodes.  Objective:  Vital signs in last 24 hours:  Blood pressure (!) 154/89, pulse 91, temperature 98.2 F (36.8 C), temperature source Oral, resp. rate 18, height 6' (1.829 m), weight 219 lb 4.8 oz (99.5 kg), SpO2 97 %.    HEENT: Neck without mass. Lymphatics: No palpable cervical or supraclavicular lymph nodes. Resp: Lungs clear bilaterally. Cardio: Regular rate and rhythm. GI: Abdomen soft and nontender.  No hepatomegaly.  No mass. Vascular: No leg edema.   Lab Results:  Lab Results  Component Value Date   WBC 5.6 09/13/2018   HGB 12.9 (L) 09/13/2018   HCT 38.8 (L) 09/13/2018   MCV 86.6 09/13/2018   PLT 185 09/13/2018   NEUTROABS 3.5 07/20/2018    Imaging:  No results found.  Medications: I have reviewed the patient's current medications.  Assessment/Plan: 1. 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  1. Status post a right colon/ileum resection on 09/29/2011.  2. Wedge biopsy of a right liver lesion on 09/29/2011 confirmed metastatic well-differentiated neuroendocrine carcinoma.  3. CT of the abdomen 08/25/2011 consistent with multiple liver metastases.  4. Elevated preoperative chromogranin A level and a 24-hour urine 5-HIAA level. Stable on repeat 11/16/2011. 5. Restaging CT on 12/28/2011 confirmed slight progression of the metastatic liver lesions. 6. Status post 3 cycles of Xeloda/temozolomide with cycle 3 initiated on 03/12/2012 7. Restaging CT of the abdomen 04/04/2012 revealed slight progression of the metastatic liver lesions and  transverse mesocolon adenopathy. 8. Status post Y-90 radioembolization of the right liver on 05/19/2012. 9. Status post Y-90 radioembolization of the left liver on 07/06/2012. 10. Chromogranin A level decreased on 08/29/2012, 24-hour urine 5 HIAA slightly increased on 09/05/2012 11. Restaging CT on 12/05/2012 with a mixed response in the liver 12. Restaging CT 08/11/2013-mild increase in the size of liver lesions. 13. Restaging CT 01/22/2014-stable to slightly improved liver lesions, mild increase in mesenteric adenopathy. Chromogranin A level stable. 14. Chromogranin A level stable 05/01/2014. 15. Chromogranin A higher on 08/02/2014 16. Restaging CT 11/06/2014 with interval enlargement of the enhancing lesions within the left and right hepatic lobe. 17. Initiation of monthly Sandostatin 12/24/2014 18. Status post Y-90 radioembolization of the right liver on 01/03/2015 19. Chromogranin A level stable 02/18/2015 20. Chromogranin A level improved 08/12/2015 21. Restaging CT 09/11/2015 with mild increase in the size of the dominant liver metastasis in the right hepatic lobe. Other smaller liver metastases showed no significant change. Stable mild lymphadenopathy in central small bowel mesentery and right cardiophrenic angle. Stable subcm right middle lobe pulmonary nodule. 22. Monthly Sandostatin continued 23. Gallium-DOTATATE01/25/2018-increased activity of a dominant right liver mass and left liver lesion. Increased tracer accumulation in a mesenteric mass 24. Cycle 1 Lutathera 03/30/2018 25. Cycle 2 Lutathera 05/25/2018 26. Cycle 3 Lutathera 07/20/2018 27. Cycle 4 Lutathera 09/14/2018 28. Restaging Netspot 10/18/2018- current scan very similar to pretherapy dotatate PET scan.  No evidence of new or progressive neuroendocrine tumor.  No enlarged tumor sites. 2. History of diarrhea (3 to 4 times per day), predating surgery for 8 months. Decrease in daily bowel movements coinciding with  discontinuation of  Nexium. 3. Hypertension. 4. History of gastroesophageal reflux disease. No reflux symptoms since discontinuing Nexium. 5. Rectal and ascending colon polyps. He underwent a colonoscopy on 12/20/2012 with findings of a sessile polyp in the ascending colon and a sessile polyp in the rectum. Pathology showed hyperplastic polyps. Prior right colon resection noted with a normal appearing ileocolonic anastomosis. Small internal hemorrhoids noted. Repeat colonoscopy in 5 years. 6. History of Iron deficiency anemia 7. 08/12/2016 syncopal episode status post evaluation in the emergency department    Disposition: [name] appears stable.  He completed the fourth Lutathera treatment 09/14/2018.  Follow-up dotatate scan was stable.  He continues monthly Sandostatin.  He will return for lab and a follow-up visit in 2 months.  He will contact the office in the interim with any problems.    Ned Card ANP/GNP-BC   12/07/2018  1:57 PM

## 2018-12-07 NOTE — Patient Instructions (Signed)

## 2019-01-04 ENCOUNTER — Inpatient Hospital Stay: Payer: Medicare Other | Attending: Nurse Practitioner

## 2019-01-04 VITALS — BP 149/82 | HR 93 | Temp 98.1°F | Resp 18

## 2019-01-04 DIAGNOSIS — Z79899 Other long term (current) drug therapy: Secondary | ICD-10-CM | POA: Diagnosis not present

## 2019-01-04 DIAGNOSIS — C7A021 Malignant carcinoid tumor of the cecum: Secondary | ICD-10-CM | POA: Diagnosis not present

## 2019-01-04 MED ORDER — OCTREOTIDE ACETATE 30 MG IM KIT
PACK | INTRAMUSCULAR | Status: AC
  Filled 2019-01-04: qty 1

## 2019-01-04 MED ORDER — OCTREOTIDE ACETATE 30 MG IM KIT
30.0000 mg | PACK | Freq: Once | INTRAMUSCULAR | Status: AC
  Administered 2019-01-04: 30 mg via INTRAMUSCULAR

## 2019-01-12 ENCOUNTER — Other Ambulatory Visit: Payer: Self-pay | Admitting: Family Medicine

## 2019-02-02 ENCOUNTER — Inpatient Hospital Stay: Payer: Medicare Other | Admitting: Oncology

## 2019-02-02 ENCOUNTER — Inpatient Hospital Stay: Payer: Medicare Other | Attending: Nurse Practitioner | Admitting: *Deleted

## 2019-02-02 ENCOUNTER — Telehealth: Payer: Self-pay | Admitting: Oncology

## 2019-02-02 ENCOUNTER — Inpatient Hospital Stay: Payer: Medicare Other

## 2019-02-02 VITALS — BP 135/98 | HR 102 | Temp 97.9°F | Resp 19 | Ht 72.0 in | Wt 219.6 lb

## 2019-02-02 DIAGNOSIS — C7A021 Malignant carcinoid tumor of the cecum: Secondary | ICD-10-CM | POA: Insufficient documentation

## 2019-02-02 DIAGNOSIS — K219 Gastro-esophageal reflux disease without esophagitis: Secondary | ICD-10-CM

## 2019-02-02 DIAGNOSIS — R197 Diarrhea, unspecified: Secondary | ICD-10-CM

## 2019-02-02 DIAGNOSIS — I10 Essential (primary) hypertension: Secondary | ICD-10-CM | POA: Diagnosis not present

## 2019-02-02 DIAGNOSIS — Z79899 Other long term (current) drug therapy: Secondary | ICD-10-CM | POA: Insufficient documentation

## 2019-02-02 DIAGNOSIS — C7A098 Malignant carcinoid tumors of other sites: Secondary | ICD-10-CM

## 2019-02-02 LAB — CMP (CANCER CENTER ONLY)
ALK PHOS: 123 U/L (ref 38–126)
ALT: 14 U/L (ref 0–44)
AST: 17 U/L (ref 15–41)
Albumin: 4 g/dL (ref 3.5–5.0)
Anion gap: 7 (ref 5–15)
BUN: 14 mg/dL (ref 8–23)
CALCIUM: 9.6 mg/dL (ref 8.9–10.3)
CO2: 30 mmol/L (ref 22–32)
Chloride: 106 mmol/L (ref 98–111)
Creatinine: 1.13 mg/dL (ref 0.61–1.24)
GFR, Est AFR Am: >60 mL/min (ref >60)
GFR, Estimated: >60 mL/min (ref >60)
Glucose, Bld: 99 mg/dL (ref 70–99)
Potassium: 4.2 mmol/L (ref 3.5–5.1)
Sodium: 143 mmol/L (ref 135–145)
TOTAL PROTEIN: 7.4 g/dL (ref 6.5–8.1)
Total Bilirubin: 0.5 mg/dL (ref 0.3–1.2)

## 2019-02-02 LAB — CBC WITH DIFFERENTIAL (CANCER CENTER ONLY)
Abs Immature Granulocytes: 0.03 10*3/uL (ref 0.00–0.07)
BASOS PCT: 0 %
Basophils Absolute: 0 10*3/uL (ref 0.0–0.1)
Eosinophils Absolute: 0.1 10*3/uL (ref 0.0–0.5)
Eosinophils Relative: 1 %
HCT: 40.8 % (ref 39.0–52.0)
Hemoglobin: 12.9 g/dL — ABNORMAL LOW (ref 13.0–17.0)
Immature Granulocytes: 1 %
LYMPHS ABS: 1 10*3/uL (ref 0.7–4.0)
Lymphocytes Relative: 17 %
MCH: 28.8 pg (ref 26.0–34.0)
MCHC: 31.6 g/dL (ref 30.0–36.0)
MCV: 91.1 fL (ref 80.0–100.0)
MONOS PCT: 13 %
Monocytes Absolute: 0.7 10*3/uL (ref 0.1–1.0)
Neutro Abs: 3.9 10*3/uL (ref 1.7–7.7)
Neutrophils Relative %: 68 %
Platelet Count: 141 10*3/uL — ABNORMAL LOW (ref 150–400)
RBC: 4.48 MIL/uL (ref 4.22–5.81)
RDW: 15 % (ref 11.5–15.5)
WBC Count: 5.8 10*3/uL (ref 4.0–10.5)
nRBC: 0 % (ref 0.0–0.2)

## 2019-02-02 MED ORDER — OCTREOTIDE ACETATE 30 MG IM KIT
30.0000 mg | PACK | Freq: Once | INTRAMUSCULAR | Status: AC
  Administered 2019-02-02: 30 mg via INTRAMUSCULAR

## 2019-02-02 MED ORDER — OCTREOTIDE ACETATE 30 MG IM KIT
PACK | INTRAMUSCULAR | Status: AC
  Filled 2019-02-02: qty 1

## 2019-02-02 NOTE — Progress Notes (Signed)
[place] OFFICE PROGRESS NOTE   Diagnosis: Carcinoid tumor  INTERVAL HISTORY:   Tommy Conley returns as scheduled.  He reports feeling well.  He recently had an upper respiratory infection with sinus congestion and a cough.  He had a fever.  Fever has resolved.  He continues to have a cough.  He reports diarrhea approximately 2 days/week.  He continues monthly Sandostatin.  Objective:  Vital signs in last 24 hours:  Blood pressure (!) 135/98, pulse (!) 102, temperature 97.9 F (36.6 C), temperature source Oral, resp. rate 19, height 6' (1.829 m), weight 219 lb 9.6 oz (99.6 kg), SpO2 98 %.    HEENT: No thrush or ulcers Resp: End inspiratory bronchial sounds at the posterior chest bilaterally, no respiratory distress Cardio: Regular rate and rhythm GI: No hepatomegaly, no mass, nontender Vascular: No leg edema  Lab Results:  Lab Results  Component Value Date   WBC 5.8 02/02/2019   HGB 12.9 (L) 02/02/2019   HCT 40.8 02/02/2019   MCV 91.1 02/02/2019   PLT 141 (L) 02/02/2019   NEUTROABS 3.9 02/02/2019    CMP  Lab Results  Component Value Date   NA 143 02/02/2019   K 4.2 02/02/2019   CL 106 02/02/2019   CO2 30 02/02/2019   GLUCOSE 99 02/02/2019   BUN 14 02/02/2019   CREATININE 1.13 02/02/2019   CALCIUM 9.6 02/02/2019   PROT 7.4 02/02/2019   ALBUMIN 4.0 02/02/2019   AST 17 02/02/2019   ALT 14 02/02/2019   ALKPHOS 123 02/02/2019   BILITOT 0.5 02/02/2019   GFRNONAA >60 02/02/2019   GFRAA >60 02/02/2019     Medications: I have reviewed the patient's current medications.   Assessment/Plan: 1. 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  1. Status post a right colon/ileum resection on 09/29/2011.  2. Wedge biopsy of a right liver lesion on 09/29/2011 confirmed metastatic well-differentiated neuroendocrine carcinoma.  3. CT of the abdomen 08/25/2011 consistent with  multiple liver metastases.  4. Elevated preoperative chromogranin A level and a 24-hour urine 5-HIAA level. Stable on repeat 11/16/2011. 5. Restaging CT on 12/28/2011 confirmed slight progression of the metastatic liver lesions. 6. Status post 3 cycles of Xeloda/temozolomide with cycle 3 initiated on 03/12/2012 7. Restaging CT of the abdomen 04/04/2012 revealed slight progression of the metastatic liver lesions and transverse mesocolon adenopathy. 8. Status post Y-90 radioembolization of the right liver on 05/19/2012. 9. Status post Y-90 radioembolization of the left liver on 07/06/2012. 10. Chromogranin A level decreased on 08/29/2012, 24-hour urine 5 HIAA slightly increased on 09/05/2012 11. Restaging CT on 12/05/2012 with a mixed response in the liver 12. Restaging CT 08/11/2013-mild increase in the size of liver lesions. 13. Restaging CT 01/22/2014-stable to slightly improved liver lesions, mild increase in mesenteric adenopathy. Chromogranin A level stable. 14. Chromogranin A level stable 05/01/2014. 15. Chromogranin A higher on 08/02/2014 16. Restaging CT 11/06/2014 with interval enlargement of the enhancing lesions within the left and right hepatic lobe. 17. Initiation of monthly Sandostatin 12/24/2014 18. Status post Y-90 radioembolization of the right liver on 01/03/2015 19. Chromogranin A level stable 02/18/2015 20. Chromogranin A level improved 08/12/2015 21. Restaging CT 09/11/2015 with mild increase in the size of the dominant liver metastasis in the right hepatic lobe. Other smaller liver metastases showed no significant change. Stable mild lymphadenopathy in central small bowel mesentery and right cardiophrenic angle. Stable subcm right middle lobe pulmonary nodule. 22. Monthly Sandostatin continued  23. Gallium-DOTATATE01/25/2018-increased activity of a dominant right liver mass and left liver lesion. Increased tracer accumulation in a mesenteric mass 24. Cycle 1 Lutathera  03/30/2018 25. Cycle 2 Lutathera 05/25/2018 26. Cycle 3 Lutathera 07/20/2018 27. Cycle 4 Lutathera 09/14/2018 28. Restaging Netspot 10/18/2018- current scan very similar to pretherapy dotatate PET scan.  No evidence of new or progressive neuroendocrine tumor.  No enlarged tumor sites. 2. History of diarrhea (3 to 4 times per day), predating surgery for 8 months. Decrease in daily bowel movements coinciding with discontinuation of Nexium. 3. Hypertension. 4. History of gastroesophageal reflux disease. No reflux symptoms since discontinuing Nexium. 5. Rectal and ascending colon polyps. He underwent a colonoscopy on 12/20/2012 with findings of a sessile polyp in the ascending colon and a sessile polyp in the rectum. Pathology showed hyperplastic polyps. Prior right colon resection noted with a normal appearing ileocolonic anastomosis. Small internal hemorrhoids noted. Repeat colonoscopy in 5 years. 6. History of Iron deficiency anemia 7. 08/12/2016 syncopal episode status post evaluation in the emergency department      Disposition: Tommy Conley appears well.  There is no clinical evidence for progression of the metastatic carcinoid tumor.  He will continue monthly Sandostatin.  We will follow-up on the chromogranin A level from today.  He will return for an office and lab visit in 3 months.  Betsy Coder, MD  02/02/2019  1:31 PM

## 2019-02-02 NOTE — Telephone Encounter (Signed)
Scheduled appt per 02/13 los. ° °Printed calendar and avs. °

## 2019-02-02 NOTE — Progress Notes (Signed)
Injection given to Pt. While seeing Dr. Learta Codding

## 2019-02-02 NOTE — Patient Instructions (Signed)

## 2019-02-06 LAB — CHROMOGRANIN A: CHROMOGRANIN A (NG/ML): 2211 ng/mL — AB (ref 0.0–101.8)

## 2019-02-09 NOTE — Progress Notes (Signed)
Sent back to MD for clarification

## 2019-02-13 ENCOUNTER — Telehealth: Payer: Self-pay | Admitting: Family Medicine

## 2019-02-13 MED ORDER — TAMSULOSIN HCL 0.4 MG PO CAPS: ORAL_CAPSULE | ORAL | 3 refills | Status: DC

## 2019-02-13 NOTE — Telephone Encounter (Signed)
One yr worth

## 2019-02-13 NOTE — Telephone Encounter (Signed)
[facility] requesting refill for patients tamsulosin (FLOMAX) 0.4 MG CAPS capsule    Pharmacy:  [facility] [place], [place] - [address] AT [address]

## 2019-02-13 NOTE — Telephone Encounter (Signed)
Medication sent to requesting pharmacy.

## 2019-03-01 ENCOUNTER — Other Ambulatory Visit: Payer: Self-pay | Admitting: *Deleted

## 2019-03-01 MED ORDER — POTASSIUM CHLORIDE CRYS ER 20 MEQ PO TBCR: 20.0000 meq | EXTENDED_RELEASE_TABLET | Freq: Every day | ORAL | 1 refills | Status: DC

## 2019-03-03 ENCOUNTER — Inpatient Hospital Stay: Payer: Medicare Other

## 2019-03-06 ENCOUNTER — Other Ambulatory Visit: Payer: Self-pay

## 2019-03-06 ENCOUNTER — Inpatient Hospital Stay: Payer: Medicare Other | Attending: Nurse Practitioner

## 2019-03-06 DIAGNOSIS — C7A021 Malignant carcinoid tumor of the cecum: Secondary | ICD-10-CM | POA: Diagnosis not present

## 2019-03-06 DIAGNOSIS — Z79899 Other long term (current) drug therapy: Secondary | ICD-10-CM | POA: Insufficient documentation

## 2019-03-06 MED ORDER — OCTREOTIDE ACETATE 30 MG IM KIT
30.0000 mg | PACK | Freq: Once | INTRAMUSCULAR | Status: AC
  Administered 2019-03-06: 30 mg via INTRAMUSCULAR

## 2019-03-06 MED ORDER — OCTREOTIDE ACETATE 30 MG IM KIT
PACK | INTRAMUSCULAR | Status: AC
  Filled 2019-03-06: qty 1

## 2019-03-06 NOTE — Patient Instructions (Signed)

## 2019-03-10 ENCOUNTER — Other Ambulatory Visit: Payer: Self-pay | Admitting: *Deleted

## 2019-03-10 MED ORDER — LOSARTAN POTASSIUM 100 MG PO TABS: ORAL_TABLET | ORAL | 0 refills | Status: DC

## 2019-03-10 MED ORDER — HYOSCYAMINE SULFATE ER 0.375 MG PO TB12: 0.3750 mg | ORAL_TABLET | Freq: Two times a day (BID) | ORAL | 5 refills | Status: DC

## 2019-03-10 MED ORDER — DICYCLOMINE HCL 20 MG PO TABS: ORAL_TABLET | ORAL | 0 refills | Status: DC

## 2019-03-10 MED ORDER — HYDRALAZINE HCL 50 MG PO TABS: 50.0000 mg | ORAL_TABLET | Freq: Three times a day (TID) | ORAL | 0 refills | Status: DC

## 2019-03-10 MED ORDER — TAMSULOSIN HCL 0.4 MG PO CAPS: ORAL_CAPSULE | ORAL | 0 refills | Status: DC

## 2019-03-10 MED ORDER — METOPROLOL SUCCINATE ER 50 MG PO TB24: ORAL_TABLET | ORAL | 0 refills | Status: DC

## 2019-03-10 MED ORDER — AMLODIPINE BESYLATE 10 MG PO TABS: 10.0000 mg | ORAL_TABLET | Freq: Every day | ORAL | 0 refills | Status: DC

## 2019-03-10 NOTE — Telephone Encounter (Signed)
4 months refill each

## 2019-03-22 ENCOUNTER — Other Ambulatory Visit: Payer: Self-pay | Admitting: Family Medicine

## 2019-04-03 ENCOUNTER — Other Ambulatory Visit: Payer: Self-pay | Admitting: Family Medicine

## 2019-04-03 ENCOUNTER — Inpatient Hospital Stay: Payer: Medicare Other | Attending: Nurse Practitioner

## 2019-04-03 ENCOUNTER — Other Ambulatory Visit: Payer: Self-pay

## 2019-04-03 VITALS — BP 159/87 | HR 90 | Temp 98.0°F | Resp 18

## 2019-04-03 DIAGNOSIS — R197 Diarrhea, unspecified: Secondary | ICD-10-CM | POA: Diagnosis not present

## 2019-04-03 DIAGNOSIS — Z79899 Other long term (current) drug therapy: Secondary | ICD-10-CM | POA: Diagnosis not present

## 2019-04-03 DIAGNOSIS — C7A021 Malignant carcinoid tumor of the cecum: Secondary | ICD-10-CM | POA: Insufficient documentation

## 2019-04-03 MED ORDER — OCTREOTIDE ACETATE 30 MG IM KIT
30.0000 mg | PACK | Freq: Once | INTRAMUSCULAR | Status: AC
  Administered 2019-04-03: 12:00:00 30 mg via INTRAMUSCULAR

## 2019-04-03 MED ORDER — OCTREOTIDE ACETATE 30 MG IM KIT
PACK | INTRAMUSCULAR | Status: AC
  Filled 2019-04-03: qty 1

## 2019-04-03 NOTE — Patient Instructions (Signed)

## 2019-04-10 ENCOUNTER — Other Ambulatory Visit: Payer: Self-pay

## 2019-04-10 ENCOUNTER — Telehealth: Payer: Self-pay | Admitting: *Deleted

## 2019-04-10 ENCOUNTER — Ambulatory Visit (INDEPENDENT_AMBULATORY_CARE_PROVIDER_SITE_OTHER): Payer: Medicare Other | Admitting: Family Medicine

## 2019-04-10 ENCOUNTER — Encounter: Payer: Self-pay | Admitting: Family Medicine

## 2019-04-10 ENCOUNTER — Other Ambulatory Visit: Payer: Self-pay | Admitting: Family Medicine

## 2019-04-10 VITALS — Wt 218.0 lb

## 2019-04-10 DIAGNOSIS — I10 Essential (primary) hypertension: Secondary | ICD-10-CM

## 2019-04-10 DIAGNOSIS — N400 Enlarged prostate without lower urinary tract symptoms: Secondary | ICD-10-CM

## 2019-04-10 DIAGNOSIS — K588 Other irritable bowel syndrome: Secondary | ICD-10-CM

## 2019-04-10 MED ORDER — METOPROLOL SUCCINATE ER 50 MG PO TB24: ORAL_TABLET | ORAL | 1 refills | Status: DC

## 2019-04-10 MED ORDER — LOSARTAN POTASSIUM 100 MG PO TABS: ORAL_TABLET | ORAL | 1 refills | Status: DC

## 2019-04-10 MED ORDER — HYOSCYAMINE SULFATE ER 0.375 MG PO TB12: 0.3750 mg | ORAL_TABLET | Freq: Two times a day (BID) | ORAL | 1 refills | Status: DC

## 2019-04-10 MED ORDER — AMLODIPINE BESYLATE 10 MG PO TABS: 10.0000 mg | ORAL_TABLET | Freq: Every day | ORAL | 0 refills | Status: DC

## 2019-04-10 MED ORDER — HYDRALAZINE HCL 50 MG PO TABS: 50.0000 mg | ORAL_TABLET | Freq: Three times a day (TID) | ORAL | 0 refills | Status: DC

## 2019-04-10 MED ORDER — TAMSULOSIN HCL 0.4 MG PO CAPS: ORAL_CAPSULE | ORAL | 1 refills | Status: DC

## 2019-04-10 MED ORDER — HYDROCHLOROTHIAZIDE 25 MG PO TABS: ORAL_TABLET | ORAL | 1 refills | Status: DC

## 2019-04-10 NOTE — Telephone Encounter (Signed)
Patient's insurance will not cover Hyoscyamine 0.375mg ER tabs #10 one tab BID ([facility] [place])  Please advise

## 2019-04-10 NOTE — Progress Notes (Signed)
   Subjective:  Video plus audio  Patient ID: Tommy Conley, male    DOB: [birth year], 68 y.o.   MRN: [id number]  Hypertension  This is a chronic problem. (Place came up about a inch from ear lobe. Size of pecan. Lasted about a week but has not subsided. Area was painful if touch. ) There are no compliance problems.    Virtual Visit via Video Note  I connected with Tommy Conley on 04/10/19 at 10:00 AM EDT by a video enabled telemedicine application and verified that I am speaking with the correct person using two identifiers.   I discussed the limitations of evaluation and management by telemedicine and the availability of in person appointments. The patient expressed understanding and agreed to proceed.  History of Present Illness:    Observations/Objective:   Assessment and Plan:   Follow Up Instructions:    I discussed the assessment and treatment plan with the patient. The patient was provided an opportunity to ask questions and all were answered. The patient agreed with the plan and demonstrated an understanding of the instructions.   The patient was advised to call back or seek an in-person evaluation if the symptoms worsen or if the condition fails to improve as anticipated.  I provided 25 minutes of non-face-to-face time during this encounter. Blood pressure medicine and blood pressure levels reviewed today with patient. Compliant with blood pressure medicine. States does not miss a dose. No obvious side effects. Blood pressure generally good when checked elsewhere. Watching salt intake.  Prostate hypertrophy.  Compliant with Flomax.  States still definitely helping no negative side effects wishes to maintain  Chronic IBS.  Claims compliance with meds.  Definitely helps.  Ran out recently needs refill on.  We will work on this   [employer], LPN  No headache, no major weight loss or weight gain, no chest pain no back pain abdominal pain no change in bowel  habits complete ROS otherwise negative   Review of Systems     Objective:   Physical Exam  Virtual visit      Assessment & Plan:  Impression hypertension.  Good control.  Maintain same meds  2.  Irritable bowel syndrome.  Ongoing.  Will renew meds compliance discussed  3.  Carcinoid tumor followed by specialist  4.  Prostate hypertrophy clinically stable tolerating meds well to maintain  No blood work at this time.  Diet exercise discussed and encouraged.  Follow-up in 6 months.  [facility] 10

## 2019-04-11 NOTE — Telephone Encounter (Signed)
Left message to return call 

## 2019-04-11 NOTE — Telephone Encounter (Signed)
Let pt know his insurnce is now refusing to cover this

## 2019-04-12 MED ORDER — DICYCLOMINE HCL 20 MG PO TABS: ORAL_TABLET | ORAL | 5 refills | Status: DC

## 2019-04-12 NOTE — Telephone Encounter (Signed)
Yes six mo worth remove levbid

## 2019-04-12 NOTE — Telephone Encounter (Signed)
Prescription sent electronically to pharmacy. Patient notified. 

## 2019-04-12 NOTE — Addendum Note (Signed)
Addended by: Dairl Ponder on: 04/12/2019 11:26 AM   Modules accepted: Orders

## 2019-04-12 NOTE — Telephone Encounter (Signed)
Patient notified and stated he wants to get a refill on Bentyl because that is what he has been taking -not the levbid  Can we refill bentyl

## 2019-05-03 ENCOUNTER — Inpatient Hospital Stay: Payer: Medicare Other | Attending: Nurse Practitioner

## 2019-05-03 ENCOUNTER — Telehealth: Payer: Self-pay | Admitting: Nurse Practitioner

## 2019-05-03 ENCOUNTER — Encounter: Payer: Self-pay | Admitting: Nurse Practitioner

## 2019-05-03 ENCOUNTER — Inpatient Hospital Stay (HOSPITAL_BASED_OUTPATIENT_CLINIC_OR_DEPARTMENT_OTHER): Payer: Medicare Other | Admitting: Nurse Practitioner

## 2019-05-03 ENCOUNTER — Inpatient Hospital Stay: Payer: Medicare Other

## 2019-05-03 ENCOUNTER — Other Ambulatory Visit: Payer: Self-pay

## 2019-05-03 VITALS — BP 156/95 | HR 96 | Temp 98.1°F | Resp 18 | Ht 72.0 in | Wt 229.1 lb

## 2019-05-03 DIAGNOSIS — C7B02 Secondary carcinoid tumors of liver: Secondary | ICD-10-CM | POA: Insufficient documentation

## 2019-05-03 DIAGNOSIS — C7A021 Malignant carcinoid tumor of the cecum: Secondary | ICD-10-CM | POA: Diagnosis not present

## 2019-05-03 DIAGNOSIS — R911 Solitary pulmonary nodule: Secondary | ICD-10-CM | POA: Diagnosis not present

## 2019-05-03 DIAGNOSIS — Z86010 Personal history of colonic polyps: Secondary | ICD-10-CM | POA: Diagnosis not present

## 2019-05-03 DIAGNOSIS — I10 Essential (primary) hypertension: Secondary | ICD-10-CM

## 2019-05-03 DIAGNOSIS — Z79899 Other long term (current) drug therapy: Secondary | ICD-10-CM

## 2019-05-03 DIAGNOSIS — K219 Gastro-esophageal reflux disease without esophagitis: Secondary | ICD-10-CM

## 2019-05-03 DIAGNOSIS — C7A098 Malignant carcinoid tumors of other sites: Secondary | ICD-10-CM

## 2019-05-03 LAB — CBC WITH DIFFERENTIAL (CANCER CENTER ONLY)
Abs Immature Granulocytes: 0.01 10*3/uL (ref 0.00–0.07)
Basophils Absolute: 0 10*3/uL (ref 0.0–0.1)
Basophils Relative: 1 %
Eosinophils Absolute: 0.1 10*3/uL (ref 0.0–0.5)
Eosinophils Relative: 1 %
HCT: 41.1 % (ref 39.0–52.0)
Hemoglobin: 12.8 g/dL — ABNORMAL LOW (ref 13.0–17.0)
Immature Granulocytes: 0 %
Lymphocytes Relative: 17 %
Lymphs Abs: 1 10*3/uL (ref 0.7–4.0)
MCH: 28.8 pg (ref 26.0–34.0)
MCHC: 31.1 g/dL (ref 30.0–36.0)
MCV: 92.4 fL (ref 80.0–100.0)
Monocytes Absolute: 0.5 10*3/uL (ref 0.1–1.0)
Monocytes Relative: 9 %
Neutro Abs: 4 10*3/uL (ref 1.7–7.7)
Neutrophils Relative %: 72 %
Platelet Count: 149 10*3/uL — ABNORMAL LOW (ref 150–400)
RBC: 4.45 MIL/uL (ref 4.22–5.81)
RDW: 14.7 % (ref 11.5–15.5)
WBC Count: 5.5 10*3/uL (ref 4.0–10.5)
nRBC: 0 % (ref 0.0–0.2)

## 2019-05-03 LAB — CMP (CANCER CENTER ONLY)
ALT: 14 U/L (ref 0–44)
AST: 16 U/L (ref 15–41)
Albumin: 3.8 g/dL (ref 3.5–5.0)
Alkaline Phosphatase: 118 U/L (ref 38–126)
Anion gap: 8 (ref 5–15)
BUN: 9 mg/dL (ref 8–23)
CO2: 29 mmol/L (ref 22–32)
Calcium: 9.3 mg/dL (ref 8.9–10.3)
Chloride: 105 mmol/L (ref 98–111)
Creatinine: 1.14 mg/dL (ref 0.61–1.24)
GFR, Est AFR Am: >60 mL/min (ref >60)
GFR, Estimated: >60 mL/min (ref >60)
Glucose, Bld: 153 mg/dL — ABNORMAL HIGH (ref 70–99)
Potassium: 4 mmol/L (ref 3.5–5.1)
Sodium: 142 mmol/L (ref 135–145)
Total Bilirubin: 0.5 mg/dL (ref 0.3–1.2)
Total Protein: 7.3 g/dL (ref 6.5–8.1)

## 2019-05-03 MED ORDER — OCTREOTIDE ACETATE 30 MG IM KIT
30.0000 mg | PACK | Freq: Once | INTRAMUSCULAR | Status: AC
  Administered 2019-05-03: 30 mg via INTRAMUSCULAR

## 2019-05-03 MED ORDER — OCTREOTIDE ACETATE 30 MG IM KIT
PACK | INTRAMUSCULAR | Status: AC
  Filled 2019-05-03: qty 1

## 2019-05-03 NOTE — Telephone Encounter (Signed)
Scheduled appt per 5/13 los. ° °A calendar will be mailed out. °

## 2019-05-03 NOTE — Patient Instructions (Signed)

## 2019-05-03 NOTE — Progress Notes (Signed)
[place] OFFICE PROGRESS NOTE   Diagnosis: Carcinoid tumor  INTERVAL HISTORY:   Tommy Conley returns as scheduled.  He feels well.  He has a good appetite.  He has gained some weight since his last visit.  He denies pain.  No diarrhea.  No flushing.  Objective:  Vital signs in last 24 hours:  Blood pressure (!) 156/95, pulse 96, temperature 98.1 F (36.7 C), temperature source Oral, resp. rate 18, height 6' (1.829 m), weight 229 lb 1.6 oz (103.9 kg), SpO2 100 %.    GI: Abdomen soft and nontender.  No hepatomegaly.  No mass. Vascular: No leg edema.   Lab Results:  Lab Results  Component Value Date   WBC 5.5 05/03/2019   HGB 12.8 (L) 05/03/2019   HCT 41.1 05/03/2019   MCV 92.4 05/03/2019   PLT 149 (L) 05/03/2019   NEUTROABS 4.0 05/03/2019    Imaging:  No results found.  Medications: I have reviewed the patient's current medications.  Assessment/Plan: 1. 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  1. Status post a right colon/ileum resection on 09/29/2011.  2. Wedge biopsy of a right liver lesion on 09/29/2011 confirmed metastatic well-differentiated neuroendocrine carcinoma.  3. CT of the abdomen 08/25/2011 consistent with multiple liver metastases.  4. Elevated preoperative chromogranin A level and a 24-hour urine 5-HIAA level. Stable on repeat 11/16/2011. 5. Restaging CT on 12/28/2011 confirmed slight progression of the metastatic liver lesions. 6. Status post 3 cycles of Xeloda/temozolomide with cycle 3 initiated on 03/12/2012 7. Restaging CT of the abdomen 04/04/2012 revealed slight progression of the metastatic liver lesions and transverse mesocolon adenopathy. 8. Status post Y-90 radioembolization of the right liver on 05/19/2012. 9. Status post Y-90 radioembolization of the left liver on 07/06/2012. 10. Chromogranin A level decreased on 08/29/2012, 24-hour  urine 5 HIAA slightly increased on 09/05/2012 11. Restaging CT on 12/05/2012 with a mixed response in the liver 12. Restaging CT 08/11/2013-mild increase in the size of liver lesions. 13. Restaging CT 01/22/2014-stable to slightly improved liver lesions, mild increase in mesenteric adenopathy. Chromogranin A level stable. 14. Chromogranin A level stable 05/01/2014. 15. Chromogranin A higher on 08/02/2014 16. Restaging CT 11/06/2014 with interval enlargement of the enhancing lesions within the left and right hepatic lobe. 17. Initiation of monthly Sandostatin 12/24/2014 18. Status post Y-90 radioembolization of the right liver on 01/03/2015 19. Chromogranin A level stable 02/18/2015 20. Chromogranin A level improved 08/12/2015 21. Restaging CT 09/11/2015 with mild increase in the size of the dominant liver metastasis in the right hepatic lobe. Other smaller liver metastases showed no significant change. Stable mild lymphadenopathy in central small bowel mesentery and right cardiophrenic angle. Stable subcm right middle lobe pulmonary nodule. 22. Monthly Sandostatin continued 23. Gallium-DOTATATE01/25/2018-increased activity of a dominant right liver mass and left liver lesion. Increased tracer accumulation in a mesenteric mass 24. Cycle 1Lutathera 03/30/2018 25. Cycle 2 Lutathera 05/25/2018 26. Cycle 3 Lutathera 07/20/2018 27. Cycle 4 Lutathera 09/14/2018 28. Restaging Netspot 10/18/2018- current scan very similar to pretherapy dotatate PET scan.  No evidence of new or progressive neuroendocrine tumor.  No enlarged tumor sites. 2. History of diarrhea (3 to 4 times per day), predating surgery for 8 months. Decrease in daily bowel movements coinciding with discontinuation of Nexium. 3. Hypertension. 4. History of gastroesophageal reflux disease. No reflux symptoms since discontinuing Nexium. 5. Rectal and ascending colon polyps. He underwent a colonoscopy on 12/20/2012 with findings of a sessile  polyp in the ascending colon and a sessile polyp in the rectum. Pathology showed hyperplastic polyps. Prior right colon resection noted with a normal appearing ileocolonic anastomosis. Small internal hemorrhoids noted. Repeat colonoscopy in 5 years. 6. History of Iron deficiency anemia 7. 08/12/2016 syncopal episode status post evaluation in the emergency department   Disposition: Tommy Conley appears well.  There is no clinical evidence for progression of the metastatic carcinoid tumor.  He will continue monthly Sandostatin.  We will follow-up on the chromogranin A level from today.  He will return for lab and follow-up in 3 months.    Ned Card ANP/GNP-BC   05/03/2019  12:57 PM

## 2019-05-04 LAB — CHROMOGRANIN A: Chromogranin A (ng/mL): 2043 ng/mL — ABNORMAL HIGH (ref 0.0–101.8)

## 2019-05-08 ENCOUNTER — Telehealth: Payer: Self-pay | Admitting: Family Medicine

## 2019-05-08 DIAGNOSIS — H9190 Unspecified hearing loss, unspecified ear: Secondary | ICD-10-CM

## 2019-05-08 NOTE — Telephone Encounter (Signed)
Patient states he has seen this Dr. In the past and he would like to have the referral replaced for processing.

## 2019-05-08 NOTE — Telephone Encounter (Signed)
sure

## 2019-05-08 NOTE — Telephone Encounter (Signed)
Pt contacted and informed that a visit with provider would be OK. Pt transferred up front to schedule virtual visit.

## 2019-05-08 NOTE — Telephone Encounter (Signed)
Referral placed. Pt is wanting to know if he should do a visit with provider for hearing loss. Pt states that he believes that medications could be causing ringing in ears. Please advise. Thank you

## 2019-05-08 NOTE — Telephone Encounter (Signed)
ok 

## 2019-05-08 NOTE — Telephone Encounter (Signed)
Pt needs referral to [facility], he's see Jolene Provost before  Pt states he's loosing his hearing   Please initiate referral so it can be processed

## 2019-05-09 ENCOUNTER — Ambulatory Visit (INDEPENDENT_AMBULATORY_CARE_PROVIDER_SITE_OTHER): Payer: Medicare Other | Admitting: Family Medicine

## 2019-05-09 ENCOUNTER — Other Ambulatory Visit: Payer: Self-pay

## 2019-05-09 ENCOUNTER — Encounter: Payer: Self-pay | Admitting: Family Medicine

## 2019-05-09 DIAGNOSIS — J329 Chronic sinusitis, unspecified: Secondary | ICD-10-CM

## 2019-05-09 DIAGNOSIS — H6503 Acute serous otitis media, bilateral: Secondary | ICD-10-CM

## 2019-05-09 MED ORDER — AMOXICILLIN-POT CLAVULANATE 875-125 MG PO TABS: 1.0000 | ORAL_TABLET | Freq: Two times a day (BID) | ORAL | 0 refills | Status: DC

## 2019-05-09 NOTE — Progress Notes (Signed)
   Subjective:    Patient ID: Tommy Conley, male    DOB: July 28, 1951, 68 y.o.   MRN: [id number] Audio plus visual Sinusitis  This is a new problem. Episode onset: 5 days. (Ringing in ears, not able to hear out of left ear, sinus pressure)   Virtual Visit via Video Note  I connected with Rober Minion on 05/09/19 at  1:10 PM EDT by a video enabled telemedicine application and verified that I am speaking with the correct person using two identifiers.  Location: Patient: home Provider: office   I discussed the limitations of evaluation and management by telemedicine and the availability of in person appointments. The patient expressed understanding and agreed to proceed.  History of Present Illness:    Observations/Objective:   Assessment and Plan:   Follow Up Instructions:    I discussed the assessment and treatment plan with the patient. The patient was provided an opportunity to ask questions and all were answered. The patient agreed with the plan and demonstrated an understanding of the instructions.   The patient was advised to call back or seek an in-person evaluation if the symptoms worsen or if the condition fails to improve as anticipated.  I provided 15 minutes of non-face-to-face time during this encounter.     Review of Systems No headache, no major weight loss or weight gain, no chest pain no back pain abdominal pain no change in bowel habits complete ROS otherwise negative     Objective:   Physical Exam  Virtual visit      Assessment & Plan:  Impression rhinosinusitis likely post viral, discussed with patient. plan antibiotics prescribed. Questions answered. Symptomatic care discussed. warning signs discussed. WSL And/or post allergenic.  Bilateral muffled left greater than right.  So likely middle ear involvement also.  Trial of antibiotics discussed

## 2019-06-02 ENCOUNTER — Other Ambulatory Visit: Payer: Self-pay

## 2019-06-02 ENCOUNTER — Inpatient Hospital Stay: Payer: Medicare Other | Attending: Nurse Practitioner

## 2019-06-02 VITALS — BP 146/91 | HR 88 | Temp 98.0°F | Resp 18

## 2019-06-02 DIAGNOSIS — Z79899 Other long term (current) drug therapy: Secondary | ICD-10-CM | POA: Insufficient documentation

## 2019-06-02 DIAGNOSIS — C7A021 Malignant carcinoid tumor of the cecum: Secondary | ICD-10-CM | POA: Insufficient documentation

## 2019-06-02 MED ORDER — OCTREOTIDE ACETATE 30 MG IM KIT
30.0000 mg | PACK | Freq: Once | INTRAMUSCULAR | Status: AC
  Administered 2019-06-02: 30 mg via INTRAMUSCULAR

## 2019-06-02 MED ORDER — OCTREOTIDE ACETATE 30 MG IM KIT
PACK | INTRAMUSCULAR | Status: AC
  Filled 2019-06-02: qty 1

## 2019-06-02 NOTE — Patient Instructions (Signed)

## 2019-06-05 ENCOUNTER — Encounter: Payer: Self-pay | Admitting: Family Medicine

## 2019-06-29 DIAGNOSIS — H903 Sensorineural hearing loss, bilateral: Secondary | ICD-10-CM | POA: Diagnosis not present

## 2019-06-29 DIAGNOSIS — H9190 Unspecified hearing loss, unspecified ear: Secondary | ICD-10-CM | POA: Diagnosis not present

## 2019-06-29 DIAGNOSIS — H9312 Tinnitus, left ear: Secondary | ICD-10-CM | POA: Diagnosis not present

## 2019-06-29 DIAGNOSIS — H9122 Sudden idiopathic hearing loss, left ear: Secondary | ICD-10-CM | POA: Diagnosis not present

## 2019-07-03 ENCOUNTER — Other Ambulatory Visit: Payer: Self-pay

## 2019-07-03 ENCOUNTER — Inpatient Hospital Stay: Payer: Medicare Other | Attending: Nurse Practitioner

## 2019-07-03 VITALS — BP 144/94 | HR 93 | Temp 98.0°F | Resp 18

## 2019-07-03 DIAGNOSIS — Z79899 Other long term (current) drug therapy: Secondary | ICD-10-CM | POA: Insufficient documentation

## 2019-07-03 DIAGNOSIS — C7A021 Malignant carcinoid tumor of the cecum: Secondary | ICD-10-CM | POA: Diagnosis not present

## 2019-07-03 MED ORDER — OCTREOTIDE ACETATE 30 MG IM KIT
30.0000 mg | PACK | Freq: Once | INTRAMUSCULAR | Status: AC
  Administered 2019-07-03: 12:00:00 30 mg via INTRAMUSCULAR

## 2019-07-03 MED ORDER — OCTREOTIDE ACETATE 30 MG IM KIT
PACK | INTRAMUSCULAR | Status: AC
  Filled 2019-07-03: qty 1

## 2019-07-03 NOTE — Patient Instructions (Signed)
Octreotide injection solution °What is this medicine? °OCTREOTIDE (ok TREE oh tide) is used to reduce blood levels of growth hormone in patients with a condition called acromegaly. This medicine also reduces flushing and watery diarrhea caused by certain types of cancer. °This medicine may be used for other purposes; ask your health care provider or pharmacist if you have questions. °COMMON BRAND NAME(S): Bynfezia, Sandostatin, Sandostatin LAR °What should I tell my health care provider before I take this medicine? °They need to know if you have any of these conditions: °· diabetes °· gallbladder disease °· kidney disease °· liver disease °· an unusual or allergic reaction to octreotide, other medicines, foods, dyes, or preservatives °· pregnant or trying to get pregnant °· breast-feeding °How should I use this medicine? °This medicine is for injection under the skin or into a vein (only in emergency situations). It is usually given by a health care professional in a hospital or clinic setting. °If you get this medicine at home, you will be taught how to prepare and give this medicine. Allow the injection solution to come to room temperature before use. Do not warm it artificially. Use exactly as directed. Take your medicine at regular intervals. Do not take your medicine more often than directed. °It is important that you put your used needles and syringes in a special sharps container. Do not put them in a trash can. If you do not have a sharps container, call your pharmacist or healthcare provider to get one. °Talk to your pediatrician regarding the use of this medicine in children. Special care may be needed. °Overdosage: If you think you have taken too much of this medicine contact a poison control center or emergency room at once. °NOTE: This medicine is only for you. Do not share this medicine with others. °What if I miss a dose? °If you miss a dose, take it as soon as you can. If it is almost time for your  next dose, take only that dose. Do not take double or extra doses. °What may interact with this medicine? °Do not take this medicine with any of the following medications: °· cisapride °· droperidol °· general anesthetics °· grepafloxacin °· perphenazine °· thioridazine °This medicine may also interact with the following medications: °· bromocriptine °· cyclosporine °· diuretics °· medicines for blood pressure, heart disease, irregular heart beat °· medicines for diabetes, including insulin °· quinidine °This list may not describe all possible interactions. Give your health care provider a list of all the medicines, herbs, non-prescription drugs, or dietary supplements you use. Also tell them if you smoke, drink alcohol, or use illegal drugs. Some items may interact with your medicine. °What should I watch for while using this medicine? °Visit your doctor or health care professional for regular checks on your progress. °To help reduce irritation at the injection site, use a different site for each injection and make sure the solution is at room temperature before use. °This medicine may cause decreases in blood sugar. Signs of low blood sugar include chills, cool, pale skin or cold sweats, drowsiness, extreme hunger, fast heartbeat, headache, nausea, nervousness or anxiety, shakiness, trembling, unsteadiness, tiredness, or weakness. Contact your doctor or health care professional right away if you experience any of these symptoms. °This medicine may increase blood sugar. Ask your healthcare provider if changes in diet or medicines are needed if you have diabetes. °This medicine may cause a decrease in vitamin B12. You should make sure that you get enough vitamin B12   while you are taking this medicine. Discuss the foods you eat and the vitamins you take with your health care professional. °What side effects may I notice from receiving this medicine? °Side effects that you should report to your doctor or health care  professional as soon as possible: °· allergic reactions like skin rash, itching or hives, swelling of the face, lips, or tongue °· decreases in blood sugar °· changes in heart rate °· severe stomach pain °·  °signs and symptoms of high blood sugar such as being more thirsty or hungry or having to urinate more than normal. You may also feel very tired or have blurry vision. °Side effects that usually do not require medical attention (report to your doctor or health care professional if they continue or are bothersome): °· diarrhea or constipation °· gas or stomach pain °· nausea, vomiting °· pain, redness, swelling and irritation at site where injected °This list may not describe all possible side effects. Call your doctor for medical advice about side effects. You may report side effects to FDA at 1-800-FDA-1088. °Where should I keep my medicine? °Keep out of the reach of children. °Store in a refrigerator between 2 and 8 degrees C (36 and 46 degrees F). Protect from light. Allow to come to room temperature naturally. Do not use artificial heat. If protected from light, the injection may be stored at room temperature between 20 and 30 degrees C (70 and 86 degrees F) for 14 days. After the initial use, throw away any unused portion of a multiple dose vial after 14 days. Throw away unused portions of the ampules after use. °NOTE: This sheet is a summary. It may not cover all possible information. If you have questions about this medicine, talk to your doctor, pharmacist, or health care provider. °© 2020 [employer] (2018-09-15 08:07:09) ° °

## 2019-07-05 ENCOUNTER — Other Ambulatory Visit: Payer: Self-pay | Admitting: Physician Assistant

## 2019-07-05 DIAGNOSIS — H9122 Sudden idiopathic hearing loss, left ear: Secondary | ICD-10-CM

## 2019-07-12 ENCOUNTER — Other Ambulatory Visit: Payer: Self-pay | Admitting: Family Medicine

## 2019-08-02 DIAGNOSIS — H9120 Sudden idiopathic hearing loss, unspecified ear: Secondary | ICD-10-CM | POA: Diagnosis not present

## 2019-08-02 DIAGNOSIS — H903 Sensorineural hearing loss, bilateral: Secondary | ICD-10-CM | POA: Diagnosis not present

## 2019-08-02 DIAGNOSIS — H9312 Tinnitus, left ear: Secondary | ICD-10-CM | POA: Diagnosis not present

## 2019-08-03 ENCOUNTER — Other Ambulatory Visit: Payer: Self-pay

## 2019-08-03 ENCOUNTER — Inpatient Hospital Stay: Payer: Medicare Other | Attending: Nurse Practitioner

## 2019-08-03 ENCOUNTER — Inpatient Hospital Stay: Payer: Medicare Other

## 2019-08-03 ENCOUNTER — Inpatient Hospital Stay (HOSPITAL_BASED_OUTPATIENT_CLINIC_OR_DEPARTMENT_OTHER): Payer: Medicare Other | Admitting: Oncology

## 2019-08-03 VITALS — BP 129/82 | HR 86 | Temp 98.5°F | Resp 18 | Ht 72.0 in | Wt 231.6 lb

## 2019-08-03 DIAGNOSIS — K219 Gastro-esophageal reflux disease without esophagitis: Secondary | ICD-10-CM | POA: Insufficient documentation

## 2019-08-03 DIAGNOSIS — C7A021 Malignant carcinoid tumor of the cecum: Secondary | ICD-10-CM | POA: Insufficient documentation

## 2019-08-03 DIAGNOSIS — Z79899 Other long term (current) drug therapy: Secondary | ICD-10-CM | POA: Diagnosis not present

## 2019-08-03 DIAGNOSIS — I10 Essential (primary) hypertension: Secondary | ICD-10-CM | POA: Insufficient documentation

## 2019-08-03 DIAGNOSIS — Z86010 Personal history of colonic polyps: Secondary | ICD-10-CM | POA: Diagnosis not present

## 2019-08-03 DIAGNOSIS — D509 Iron deficiency anemia, unspecified: Secondary | ICD-10-CM | POA: Insufficient documentation

## 2019-08-03 LAB — CBC WITH DIFFERENTIAL (CANCER CENTER ONLY)
Abs Immature Granulocytes: 0.02 10*3/uL (ref 0.00–0.07)
Basophils Absolute: 0 10*3/uL (ref 0.0–0.1)
Basophils Relative: 0 %
Eosinophils Absolute: 0.1 10*3/uL (ref 0.0–0.5)
Eosinophils Relative: 2 %
HCT: 37.7 % — ABNORMAL LOW (ref 39.0–52.0)
Hemoglobin: 12.1 g/dL — ABNORMAL LOW (ref 13.0–17.0)
Immature Granulocytes: 0 %
Lymphocytes Relative: 18 %
Lymphs Abs: 1 10*3/uL (ref 0.7–4.0)
MCH: 28 pg (ref 26.0–34.0)
MCHC: 32.1 g/dL (ref 30.0–36.0)
MCV: 87.3 fL (ref 80.0–100.0)
Monocytes Absolute: 0.7 10*3/uL (ref 0.1–1.0)
Monocytes Relative: 12 %
Neutro Abs: 3.8 10*3/uL (ref 1.7–7.7)
Neutrophils Relative %: 68 %
Platelet Count: 136 10*3/uL — ABNORMAL LOW (ref 150–400)
RBC: 4.32 MIL/uL (ref 4.22–5.81)
RDW: 14.7 % (ref 11.5–15.5)
WBC Count: 5.7 10*3/uL (ref 4.0–10.5)
nRBC: 0 % (ref 0.0–0.2)

## 2019-08-03 LAB — CMP (CANCER CENTER ONLY)
ALT: 15 U/L (ref 0–44)
AST: 18 U/L (ref 15–41)
Albumin: 3.6 g/dL (ref 3.5–5.0)
Alkaline Phosphatase: 105 U/L (ref 38–126)
Anion gap: 10 (ref 5–15)
BUN: 10 mg/dL (ref 8–23)
CO2: 26 mmol/L (ref 22–32)
Calcium: 9.3 mg/dL (ref 8.9–10.3)
Chloride: 105 mmol/L (ref 98–111)
Creatinine: 1.06 mg/dL (ref 0.61–1.24)
GFR, Est AFR Am: >60 mL/min (ref >60)
GFR, Estimated: >60 mL/min (ref >60)
Glucose, Bld: 113 mg/dL — ABNORMAL HIGH (ref 70–99)
Potassium: 3.6 mmol/L (ref 3.5–5.1)
Sodium: 141 mmol/L (ref 135–145)
Total Bilirubin: 0.5 mg/dL (ref 0.3–1.2)
Total Protein: 6.9 g/dL (ref 6.5–8.1)

## 2019-08-03 MED ORDER — OCTREOTIDE ACETATE 30 MG IM KIT
PACK | INTRAMUSCULAR | Status: AC
  Filled 2019-08-03: qty 1

## 2019-08-03 MED ORDER — OCTREOTIDE ACETATE 30 MG IM KIT
30.0000 mg | PACK | Freq: Once | INTRAMUSCULAR | Status: AC
  Administered 2019-08-03: 30 mg via INTRAMUSCULAR

## 2019-08-03 NOTE — Progress Notes (Signed)
[facility] OFFICE PROGRESS NOTE   Diagnosis: Carcinoid tumor  INTERVAL HISTORY:   Tommy Conley returns as scheduled.  He continues monthly Sandostatin.  He reports feeling better since completing Lutathera treatment last year.  No abdominal pain or diarrhea.  Good appetite. He developed sudden hearing loss in the left ear last month.  He has been evaluated by ENT and is scheduled for an MRI on 08/05/2019.  Objective:  Vital signs in last 24 hours:  Blood pressure 129/82, pulse 86, temperature 98.5 F (36.9 C), temperature source Oral, resp. rate 18, height 6' (1.829 m), weight 231 lb 9.6 oz (105.1 kg), SpO2 100 %.   Limited physical examination secondary to distancing with the COVID pandemic GI: Fullness in the right upper abdomen without a discrete liver edge, nontender, no splenomegaly, no apparent ascites Vascular: No leg edema   Lab Results:  Lab Results  Component Value Date   WBC 5.7 08/03/2019   HGB 12.1 (L) 08/03/2019   HCT 37.7 (L) 08/03/2019   MCV 87.3 08/03/2019   PLT 136 (L) 08/03/2019   NEUTROABS 3.8 08/03/2019    CMP  Lab Results  Component Value Date   NA 142 05/03/2019   K 4.0 05/03/2019   CL 105 05/03/2019   CO2 29 05/03/2019   GLUCOSE 153 (H) 05/03/2019   BUN 9 05/03/2019   CREATININE 1.14 05/03/2019   CALCIUM 9.3 05/03/2019   PROT 7.3 05/03/2019   ALBUMIN 3.8 05/03/2019   AST 16 05/03/2019   ALT 14 05/03/2019   ALKPHOS 118 05/03/2019   BILITOT 0.5 05/03/2019   GFRNONAA >60 05/03/2019   GFRAA >60 05/03/2019    Chromogranin A on 05/03/2019: 2043 Medications: I have reviewed the patient's current medications.   Assessment/Plan:  1. 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  1. Status post a right colon/ileum resection on 09/29/2011.  2. Wedge biopsy of a right liver lesion on 09/29/2011 confirmed metastatic well-differentiated neuroendocrine  carcinoma.  3. CT of the abdomen 08/25/2011 consistent with multiple liver metastases.  4. Elevated preoperative chromogranin A level and a 24-hour urine 5-HIAA level. Stable on repeat 11/16/2011. 5. Restaging CT on 12/28/2011 confirmed slight progression of the metastatic liver lesions. 6. Status post 3 cycles of Xeloda/temozolomide with cycle 3 initiated on 03/12/2012 7. Restaging CT of the abdomen 04/04/2012 revealed slight progression of the metastatic liver lesions and transverse mesocolon adenopathy. 8. Status post Y-90 radioembolization of the right liver on 05/19/2012. 9. Status post Y-90 radioembolization of the left liver on 07/06/2012. 10. Chromogranin A level decreased on 08/29/2012, 24-hour urine 5 HIAA slightly increased on 09/05/2012 11. Restaging CT on 12/05/2012 with a mixed response in the liver 12. Restaging CT 08/11/2013-mild increase in the size of liver lesions. 13. Restaging CT 01/22/2014-stable to slightly improved liver lesions, mild increase in mesenteric adenopathy. Chromogranin A level stable. 14. Chromogranin A level stable 05/01/2014. 15. Chromogranin A higher on 08/02/2014 16. Restaging CT 11/06/2014 with interval enlargement of the enhancing lesions within the left and right hepatic lobe. 17. Initiation of monthly Sandostatin 12/24/2014 18. Status post Y-90 radioembolization of the right liver on 01/03/2015 19. Chromogranin A level stable 02/18/2015 20. Chromogranin A level improved 08/12/2015 21. Restaging CT 09/11/2015 with mild increase in the size of the dominant liver metastasis in the right hepatic lobe. Other smaller liver metastases showed no significant change. Stable mild lymphadenopathy in central small bowel mesentery and right cardiophrenic angle. Stable subcm right  middle lobe pulmonary nodule. 22. Monthly Sandostatin continued 23. Gallium-DOTATATE01/25/2018-increased activity of a dominant right liver mass and left liver lesion. Increased tracer  accumulation in a mesenteric mass 24. Cycle 1Lutathera 03/30/2018 25. Cycle 2 Lutathera 05/25/2018 26. Cycle 3 Lutathera 07/20/2018 27. Cycle 4 Lutathera 09/14/2018 28. Restaging Netspot 10/18/2018- current scan very similar to pretherapy dotatate PET scan.  No evidence of new or progressive neuroendocrine tumor.  No enlarged tumor sites. 2. History of diarrhea (3 to 4 times per day), predating surgery for 8 months. Decrease in daily bowel movements coinciding with discontinuation of Nexium. 3. Hypertension. 4. History of gastroesophageal reflux disease. No reflux symptoms since discontinuing Nexium. 5. Rectal and ascending colon polyps. He underwent a colonoscopy on 12/20/2012 with findings of a sessile polyp in the ascending colon and a sessile polyp in the rectum. Pathology showed hyperplastic polyps. Prior right colon resection noted with a normal appearing ileocolonic anastomosis. Small internal hemorrhoids noted. Repeat colonoscopy in 5 years. 6. History of Iron deficiency anemia 7. 08/12/2016 syncopal episode status post evaluation in the emergency department 8. Left-sided hearing loss July 2020     Disposition: Mr. Palmieri appears unchanged.  He will continue monthly Sandostatin.  We will follow-up on the chromogranin A level from today.  He will return for an office visit in 3 months.  Betsy Coder, MD  08/03/2019  11:21 AM

## 2019-08-03 NOTE — Patient Instructions (Signed)
Octreotide injection solution °What is this medicine? °OCTREOTIDE (ok TREE oh tide) is used to reduce blood levels of growth hormone in patients with a condition called acromegaly. This medicine also reduces flushing and watery diarrhea caused by certain types of cancer. °This medicine may be used for other purposes; ask your health care provider or pharmacist if you have questions. °COMMON BRAND NAME(S): Bynfezia, Sandostatin, Sandostatin LAR °What should I tell my health care provider before I take this medicine? °They need to know if you have any of these conditions: °· diabetes °· gallbladder disease °· kidney disease °· liver disease °· an unusual or allergic reaction to octreotide, other medicines, foods, dyes, or preservatives °· pregnant or trying to get pregnant °· breast-feeding °How should I use this medicine? °This medicine is for injection under the skin or into a vein (only in emergency situations). It is usually given by a health care professional in a hospital or clinic setting. °If you get this medicine at home, you will be taught how to prepare and give this medicine. Allow the injection solution to come to room temperature before use. Do not warm it artificially. Use exactly as directed. Take your medicine at regular intervals. Do not take your medicine more often than directed. °It is important that you put your used needles and syringes in a special sharps container. Do not put them in a trash can. If you do not have a sharps container, call your pharmacist or healthcare provider to get one. °Talk to your pediatrician regarding the use of this medicine in children. Special care may be needed. °Overdosage: If you think you have taken too much of this medicine contact a poison control center or emergency room at once. °NOTE: This medicine is only for you. Do not share this medicine with others. °What if I miss a dose? °If you miss a dose, take it as soon as you can. If it is almost time for your  next dose, take only that dose. Do not take double or extra doses. °What may interact with this medicine? °Do not take this medicine with any of the following medications: °· cisapride °· droperidol °· general anesthetics °· grepafloxacin °· perphenazine °· thioridazine °This medicine may also interact with the following medications: °· bromocriptine °· cyclosporine °· diuretics °· medicines for blood pressure, heart disease, irregular heart beat °· medicines for diabetes, including insulin °· quinidine °This list may not describe all possible interactions. Give your health care provider a list of all the medicines, herbs, non-prescription drugs, or dietary supplements you use. Also tell them if you smoke, drink alcohol, or use illegal drugs. Some items may interact with your medicine. °What should I watch for while using this medicine? °Visit your doctor or health care professional for regular checks on your progress. °To help reduce irritation at the injection site, use a different site for each injection and make sure the solution is at room temperature before use. °This medicine may cause decreases in blood sugar. Signs of low blood sugar include chills, cool, pale skin or cold sweats, drowsiness, extreme hunger, fast heartbeat, headache, nausea, nervousness or anxiety, shakiness, trembling, unsteadiness, tiredness, or weakness. Contact your doctor or health care professional right away if you experience any of these symptoms. °This medicine may increase blood sugar. Ask your healthcare provider if changes in diet or medicines are needed if you have diabetes. °This medicine may cause a decrease in vitamin B12. You should make sure that you get enough vitamin B12   while you are taking this medicine. Discuss the foods you eat and the vitamins you take with your health care professional. °What side effects may I notice from receiving this medicine? °Side effects that you should report to your doctor or health care  professional as soon as possible: °· allergic reactions like skin rash, itching or hives, swelling of the face, lips, or tongue °· decreases in blood sugar °· changes in heart rate °· severe stomach pain °·  °signs and symptoms of high blood sugar such as being more thirsty or hungry or having to urinate more than normal. You may also feel very tired or have blurry vision. °Side effects that usually do not require medical attention (report to your doctor or health care professional if they continue or are bothersome): °· diarrhea or constipation °· gas or stomach pain °· nausea, vomiting °· pain, redness, swelling and irritation at site where injected °This list may not describe all possible side effects. Call your doctor for medical advice about side effects. You may report side effects to FDA at 1-800-FDA-1088. °Where should I keep my medicine? °Keep out of the reach of children. °Store in a refrigerator between 2 and 8 degrees C (36 and 46 degrees F). Protect from light. Allow to come to room temperature naturally. Do not use artificial heat. If protected from light, the injection may be stored at room temperature between 20 and 30 degrees C (70 and 86 degrees F) for 14 days. After the initial use, throw away any unused portion of a multiple dose vial after 14 days. Throw away unused portions of the ampules after use. °NOTE: This sheet is a summary. It may not cover all possible information. If you have questions about this medicine, talk to your doctor, pharmacist, or health care provider. °© 2020 [employer] (2018-09-15 08:07:09) ° °

## 2019-08-04 ENCOUNTER — Telehealth: Payer: Self-pay | Admitting: Oncology

## 2019-08-04 ENCOUNTER — Encounter: Payer: Self-pay | Admitting: Oncology

## 2019-08-04 LAB — CHROMOGRANIN A: Chromogranin A (ng/mL): 2203 ng/mL — ABNORMAL HIGH (ref 0.0–101.8)

## 2019-08-04 NOTE — Telephone Encounter (Signed)
Called and spoke with patient. Confirmed upcoming appts  

## 2019-08-04 NOTE — Progress Notes (Signed)
Called pt to address his questions regarding the facility charge he's receiving.  Left msg requesting he return my call at his earliest convenience.

## 2019-08-05 ENCOUNTER — Ambulatory Visit
Admission: RE | Admit: 2019-08-05 | Discharge: 2019-08-05 | Disposition: A | Discharge status: Home / Self Care | Payer: Medicare Other | Source: Ambulatory Visit | Attending: Physician Assistant | Admitting: Physician Assistant

## 2019-08-05 ENCOUNTER — Other Ambulatory Visit: Payer: Self-pay

## 2019-08-05 DIAGNOSIS — H9122 Sudden idiopathic hearing loss, left ear: Secondary | ICD-10-CM

## 2019-08-05 DIAGNOSIS — H9313 Tinnitus, bilateral: Secondary | ICD-10-CM | POA: Diagnosis not present

## 2019-08-05 MED ORDER — GADOBENATE DIMEGLUMINE 529 MG/ML IV SOLN
20.0000 mL | Freq: Once | INTRAVENOUS | Status: AC | PRN
  Administered 2019-08-05: 13:00:00 20 mL via INTRAVENOUS

## 2019-08-24 ENCOUNTER — Other Ambulatory Visit: Payer: Self-pay | Admitting: Oncology

## 2019-09-04 ENCOUNTER — Other Ambulatory Visit: Payer: Self-pay

## 2019-09-04 ENCOUNTER — Inpatient Hospital Stay: Payer: Medicare Other | Attending: Nurse Practitioner

## 2019-09-04 VITALS — BP 154/90 | HR 97 | Temp 98.3°F | Resp 18

## 2019-09-04 DIAGNOSIS — C7A021 Malignant carcinoid tumor of the cecum: Secondary | ICD-10-CM | POA: Diagnosis not present

## 2019-09-04 DIAGNOSIS — Z79899 Other long term (current) drug therapy: Secondary | ICD-10-CM | POA: Insufficient documentation

## 2019-09-04 MED ORDER — OCTREOTIDE ACETATE 30 MG IM KIT
30.0000 mg | PACK | Freq: Once | INTRAMUSCULAR | Status: AC
  Administered 2019-09-04: 30 mg via INTRAMUSCULAR

## 2019-09-04 MED ORDER — OCTREOTIDE ACETATE 30 MG IM KIT
PACK | INTRAMUSCULAR | Status: AC
  Filled 2019-09-04: qty 1

## 2019-09-04 NOTE — Patient Instructions (Signed)
Octreotide injection solution °What is this medicine? °OCTREOTIDE (ok TREE oh tide) is used to reduce blood levels of growth hormone in patients with a condition called acromegaly. This medicine also reduces flushing and watery diarrhea caused by certain types of cancer. °This medicine may be used for other purposes; ask your health care provider or pharmacist if you have questions. °COMMON BRAND NAME(S): Bynfezia, Sandostatin, Sandostatin LAR °What should I tell my health care provider before I take this medicine? °They need to know if you have any of these conditions: °· diabetes °· gallbladder disease °· kidney disease °· liver disease °· an unusual or allergic reaction to octreotide, other medicines, foods, dyes, or preservatives °· pregnant or trying to get pregnant °· breast-feeding °How should I use this medicine? °This medicine is for injection under the skin or into a vein (only in emergency situations). It is usually given by a health care professional in a hospital or clinic setting. °If you get this medicine at home, you will be taught how to prepare and give this medicine. Allow the injection solution to come to room temperature before use. Do not warm it artificially. Use exactly as directed. Take your medicine at regular intervals. Do not take your medicine more often than directed. °It is important that you put your used needles and syringes in a special sharps container. Do not put them in a trash can. If you do not have a sharps container, call your pharmacist or healthcare provider to get one. °Talk to your pediatrician regarding the use of this medicine in children. Special care may be needed. °Overdosage: If you think you have taken too much of this medicine contact a poison control center or emergency room at once. °NOTE: This medicine is only for you. Do not share this medicine with others. °What if I miss a dose? °If you miss a dose, take it as soon as you can. If it is almost time for your  next dose, take only that dose. Do not take double or extra doses. °What may interact with this medicine? °Do not take this medicine with any of the following medications: °· cisapride °· droperidol °· general anesthetics °· grepafloxacin °· perphenazine °· thioridazine °This medicine may also interact with the following medications: °· bromocriptine °· cyclosporine °· diuretics °· medicines for blood pressure, heart disease, irregular heart beat °· medicines for diabetes, including insulin °· quinidine °This list may not describe all possible interactions. Give your health care provider a list of all the medicines, herbs, non-prescription drugs, or dietary supplements you use. Also tell them if you smoke, drink alcohol, or use illegal drugs. Some items may interact with your medicine. °What should I watch for while using this medicine? °Visit your doctor or health care professional for regular checks on your progress. °To help reduce irritation at the injection site, use a different site for each injection and make sure the solution is at room temperature before use. °This medicine may cause decreases in blood sugar. Signs of low blood sugar include chills, cool, pale skin or cold sweats, drowsiness, extreme hunger, fast heartbeat, headache, nausea, nervousness or anxiety, shakiness, trembling, unsteadiness, tiredness, or weakness. Contact your doctor or health care professional right away if you experience any of these symptoms. °This medicine may increase blood sugar. Ask your healthcare provider if changes in diet or medicines are needed if you have diabetes. °This medicine may cause a decrease in vitamin B12. You should make sure that you get enough vitamin B12   while you are taking this medicine. Discuss the foods you eat and the vitamins you take with your health care professional. °What side effects may I notice from receiving this medicine? °Side effects that you should report to your doctor or health care  professional as soon as possible: °· allergic reactions like skin rash, itching or hives, swelling of the face, lips, or tongue °· decreases in blood sugar °· changes in heart rate °· severe stomach pain °·  °signs and symptoms of high blood sugar such as being more thirsty or hungry or having to urinate more than normal. You may also feel very tired or have blurry vision. °Side effects that usually do not require medical attention (report to your doctor or health care professional if they continue or are bothersome): °· diarrhea or constipation °· gas or stomach pain °· nausea, vomiting °· pain, redness, swelling and irritation at site where injected °This list may not describe all possible side effects. Call your doctor for medical advice about side effects. You may report side effects to FDA at 1-800-FDA-1088. °Where should I keep my medicine? °Keep out of the reach of children. °Store in a refrigerator between 2 and 8 degrees C (36 and 46 degrees F). Protect from light. Allow to come to room temperature naturally. Do not use artificial heat. If protected from light, the injection may be stored at room temperature between 20 and 30 degrees C (70 and 86 degrees F) for 14 days. After the initial use, throw away any unused portion of a multiple dose vial after 14 days. Throw away unused portions of the ampules after use. °NOTE: This sheet is a summary. It may not cover all possible information. If you have questions about this medicine, talk to your doctor, pharmacist, or health care provider. °© 2020 [employer] (2018-09-15 08:07:09) ° °

## 2019-09-13 ENCOUNTER — Telehealth: Payer: Self-pay | Admitting: Family Medicine

## 2019-09-13 NOTE — Telephone Encounter (Signed)
Patient is requesting his records from his old referral to [facility] be sent to [facility] in [place] because they are in network with his insurance the other wasn't in network with his insurance . He has appointment on 10/5 with with new office. Please Advise

## 2019-09-13 NOTE — Telephone Encounter (Signed)
Please advise. Thank you

## 2019-09-14 ENCOUNTER — Other Ambulatory Visit: Payer: Self-pay | Admitting: Family Medicine

## 2019-09-18 NOTE — Telephone Encounter (Signed)
[facility] [phone])   Need fax # so I can send referral

## 2019-10-03 ENCOUNTER — Other Ambulatory Visit: Payer: Self-pay

## 2019-10-03 ENCOUNTER — Inpatient Hospital Stay: Payer: Medicare Other | Attending: Nurse Practitioner

## 2019-10-03 VITALS — BP 146/83 | HR 100 | Temp 98.7°F | Resp 18

## 2019-10-03 DIAGNOSIS — C7A021 Malignant carcinoid tumor of the cecum: Secondary | ICD-10-CM | POA: Diagnosis not present

## 2019-10-03 DIAGNOSIS — Z79899 Other long term (current) drug therapy: Secondary | ICD-10-CM | POA: Insufficient documentation

## 2019-10-03 MED ORDER — OCTREOTIDE ACETATE 30 MG IM KIT
PACK | INTRAMUSCULAR | Status: AC
  Filled 2019-10-03: qty 1

## 2019-10-03 MED ORDER — OCTREOTIDE ACETATE 30 MG IM KIT
30.0000 mg | PACK | Freq: Once | INTRAMUSCULAR | Status: AC
  Administered 2019-10-03: 30 mg via INTRAMUSCULAR

## 2019-10-03 NOTE — Patient Instructions (Signed)
Octreotide injection solution °What is this medicine? °OCTREOTIDE (ok TREE oh tide) is used to reduce blood levels of growth hormone in patients with a condition called acromegaly. This medicine also reduces flushing and watery diarrhea caused by certain types of cancer. °This medicine may be used for other purposes; ask your health care provider or pharmacist if you have questions. °COMMON BRAND NAME(S): Bynfezia, Sandostatin, Sandostatin LAR °What should I tell my health care provider before I take this medicine? °They need to know if you have any of these conditions: °· diabetes °· gallbladder disease °· kidney disease °· liver disease °· an unusual or allergic reaction to octreotide, other medicines, foods, dyes, or preservatives °· pregnant or trying to get pregnant °· breast-feeding °How should I use this medicine? °This medicine is for injection under the skin or into a vein (only in emergency situations). It is usually given by a health care professional in a hospital or clinic setting. °If you get this medicine at home, you will be taught how to prepare and give this medicine. Allow the injection solution to come to room temperature before use. Do not warm it artificially. Use exactly as directed. Take your medicine at regular intervals. Do not take your medicine more often than directed. °It is important that you put your used needles and syringes in a special sharps container. Do not put them in a trash can. If you do not have a sharps container, call your pharmacist or healthcare provider to get one. °Talk to your pediatrician regarding the use of this medicine in children. Special care may be needed. °Overdosage: If you think you have taken too much of this medicine contact a poison control center or emergency room at once. °NOTE: This medicine is only for you. Do not share this medicine with others. °What if I miss a dose? °If you miss a dose, take it as soon as you can. If it is almost time for your  next dose, take only that dose. Do not take double or extra doses. °What may interact with this medicine? °Do not take this medicine with any of the following medications: °· cisapride °· droperidol °· general anesthetics °· grepafloxacin °· perphenazine °· thioridazine °This medicine may also interact with the following medications: °· bromocriptine °· cyclosporine °· diuretics °· medicines for blood pressure, heart disease, irregular heart beat °· medicines for diabetes, including insulin °· quinidine °This list may not describe all possible interactions. Give your health care provider a list of all the medicines, herbs, non-prescription drugs, or dietary supplements you use. Also tell them if you smoke, drink alcohol, or use illegal drugs. Some items may interact with your medicine. °What should I watch for while using this medicine? °Visit your doctor or health care professional for regular checks on your progress. °To help reduce irritation at the injection site, use a different site for each injection and make sure the solution is at room temperature before use. °This medicine may cause decreases in blood sugar. Signs of low blood sugar include chills, cool, pale skin or cold sweats, drowsiness, extreme hunger, fast heartbeat, headache, nausea, nervousness or anxiety, shakiness, trembling, unsteadiness, tiredness, or weakness. Contact your doctor or health care professional right away if you experience any of these symptoms. °This medicine may increase blood sugar. Ask your healthcare provider if changes in diet or medicines are needed if you have diabetes. °This medicine may cause a decrease in vitamin B12. You should make sure that you get enough vitamin B12   while you are taking this medicine. Discuss the foods you eat and the vitamins you take with your health care professional. °What side effects may I notice from receiving this medicine? °Side effects that you should report to your doctor or health care  professional as soon as possible: °· allergic reactions like skin rash, itching or hives, swelling of the face, lips, or tongue °· decreases in blood sugar °· changes in heart rate °· severe stomach pain °·  °signs and symptoms of high blood sugar such as being more thirsty or hungry or having to urinate more than normal. You may also feel very tired or have blurry vision. °Side effects that usually do not require medical attention (report to your doctor or health care professional if they continue or are bothersome): °· diarrhea or constipation °· gas or stomach pain °· nausea, vomiting °· pain, redness, swelling and irritation at site where injected °This list may not describe all possible side effects. Call your doctor for medical advice about side effects. You may report side effects to FDA at 1-800-FDA-1088. °Where should I keep my medicine? °Keep out of the reach of children. °Store in a refrigerator between 2 and 8 degrees C (36 and 46 degrees F). Protect from light. Allow to come to room temperature naturally. Do not use artificial heat. If protected from light, the injection may be stored at room temperature between 20 and 30 degrees C (70 and 86 degrees F) for 14 days. After the initial use, throw away any unused portion of a multiple dose vial after 14 days. Throw away unused portions of the ampules after use. °NOTE: This sheet is a summary. It may not cover all possible information. If you have questions about this medicine, talk to your doctor, pharmacist, or health care provider. °© 2020 [employer] (2018-09-15 08:07:09) ° °

## 2019-10-12 ENCOUNTER — Other Ambulatory Visit: Payer: Self-pay | Admitting: Family Medicine

## 2019-10-12 NOTE — Telephone Encounter (Signed)
Please contact pt to schedule appt, then may route back to nurses. Thank you

## 2019-10-12 NOTE — Telephone Encounter (Signed)
sched 6 mo f u then may fill

## 2019-10-13 NOTE — Telephone Encounter (Signed)
Left message

## 2019-10-13 NOTE — Telephone Encounter (Signed)
Patient schedule appointment for 10/27 for 6 month follow up

## 2019-10-17 ENCOUNTER — Ambulatory Visit (INDEPENDENT_AMBULATORY_CARE_PROVIDER_SITE_OTHER): Payer: Medicare Other | Admitting: Family Medicine

## 2019-10-17 DIAGNOSIS — Z13220 Encounter for screening for lipoid disorders: Secondary | ICD-10-CM

## 2019-10-17 DIAGNOSIS — Z125 Encounter for screening for malignant neoplasm of prostate: Secondary | ICD-10-CM

## 2019-10-17 DIAGNOSIS — K588 Other irritable bowel syndrome: Secondary | ICD-10-CM | POA: Diagnosis not present

## 2019-10-17 DIAGNOSIS — H903 Sensorineural hearing loss, bilateral: Secondary | ICD-10-CM | POA: Diagnosis not present

## 2019-10-17 DIAGNOSIS — Z79899 Other long term (current) drug therapy: Secondary | ICD-10-CM | POA: Diagnosis not present

## 2019-10-17 DIAGNOSIS — I10 Essential (primary) hypertension: Secondary | ICD-10-CM | POA: Diagnosis not present

## 2019-10-17 DIAGNOSIS — N400 Enlarged prostate without lower urinary tract symptoms: Secondary | ICD-10-CM

## 2019-10-17 MED ORDER — TAMSULOSIN HCL 0.4 MG PO CAPS: ORAL_CAPSULE | ORAL | 1 refills | Status: DC

## 2019-10-17 MED ORDER — AMLODIPINE BESYLATE 10 MG PO TABS: ORAL_TABLET | ORAL | 1 refills | Status: DC

## 2019-10-17 MED ORDER — HYDROCHLOROTHIAZIDE 25 MG PO TABS: ORAL_TABLET | ORAL | 1 refills | Status: DC

## 2019-10-17 MED ORDER — LOSARTAN POTASSIUM 100 MG PO TABS: ORAL_TABLET | ORAL | 1 refills | Status: DC

## 2019-10-17 MED ORDER — METOPROLOL SUCCINATE ER 50 MG PO TB24: ORAL_TABLET | ORAL | 1 refills | Status: DC

## 2019-10-17 MED ORDER — HYDRALAZINE HCL 50 MG PO TABS: 50.0000 mg | ORAL_TABLET | Freq: Three times a day (TID) | ORAL | 1 refills | Status: DC

## 2019-10-17 NOTE — Progress Notes (Signed)
   Subjective:    Patient ID: Tommy Conley, male    DOB: 09/11/1951, 68 y.o.   MRN: [id number]  Hypertension This is a chronic problem. (Pt states his BP has been running a little high. Pt states his top number is the same but his bottom numbers are running high. ) There are no compliance problems.    Virtual Visit via Telephone Note  I connected with Tommy Conley on 10/17/19 at 11:00 AM EDT by telephone and verified that I am speaking with the correct person using two identifiers.  Location: Patient: home Provider: office   I discussed the limitations, risks, security and privacy concerns of performing an evaluation and management service by telephone and the availability of in person appointments. I also discussed with the patient that there may be a patient responsible charge related to this service. The patient expressed understanding and agreed to proceed.   History of Present Illness:    Observations/Objective:   Assessment and Plan:   Follow Up Instructions:    I discussed the assessment and treatment plan with the patient. The patient was provided an opportunity to ask questions and all were answered. The patient agreed with the plan and demonstrated an understanding of the instructions.   The patient was advised to call back or seek an in-person evaluation if the symptoms worsen or if the condition fails to improve as anticipated.  I provided 25 minutes of non-face-to-face time during this encounter. Blood pressure medicine and blood pressure levels reviewed today with patient. Compliant with blood pressure medicine.  On further history states often misses the third dose of his hydralazine unfortunately no obvious side effects. Blood pressure generally good when checked elsewhere. Watching salt intake.  Continues to benefit substantially from the Flomax although that has worn off the past year.  Wonders if he can go to the next stronger dose.  Ongoing  element of IBS.  Bentyl definitely helps when things flareup.  Trying to keep up with his fiber intake.  Tommy Males, LPN    Review of Systems No headache, no major weight loss or weight gain, no chest pain no back pain abdominal pain no change in bowel habits complete ROS otherwise negative     Objective:   Physical Exam Virtual       Assessment & Plan:  Impression 1 hypertension.  Suboptimal control discussed likely due to noncompliance encouraged to increase hydralazine ordered  2.  IBS.  Ongoing.  Symptomatic care discussed.  Fiber supplement discussed  3.  Prostate hypertrophy.  Suboptimal.  Discussed will increase Flomax rationale discussed  Appropriate blood work.  Flu shot encouraged.  Follow-up in 6 months diet exercise

## 2019-10-18 ENCOUNTER — Encounter: Payer: Self-pay | Admitting: Family Medicine

## 2019-11-03 ENCOUNTER — Inpatient Hospital Stay: Payer: Medicare Other | Attending: Nurse Practitioner | Admitting: Nurse Practitioner

## 2019-11-03 ENCOUNTER — Inpatient Hospital Stay: Payer: Medicare Other

## 2019-11-03 ENCOUNTER — Telehealth: Payer: Self-pay | Admitting: Nurse Practitioner

## 2019-11-03 DIAGNOSIS — C7B8 Other secondary neuroendocrine tumors: Secondary | ICD-10-CM | POA: Insufficient documentation

## 2019-11-03 DIAGNOSIS — K219 Gastro-esophageal reflux disease without esophagitis: Secondary | ICD-10-CM | POA: Insufficient documentation

## 2019-11-03 DIAGNOSIS — C7A021 Malignant carcinoid tumor of the cecum: Secondary | ICD-10-CM | POA: Insufficient documentation

## 2019-11-03 DIAGNOSIS — I10 Essential (primary) hypertension: Secondary | ICD-10-CM | POA: Insufficient documentation

## 2019-11-03 DIAGNOSIS — Z79899 Other long term (current) drug therapy: Secondary | ICD-10-CM | POA: Insufficient documentation

## 2019-11-03 DIAGNOSIS — Z86010 Personal history of colonic polyps: Secondary | ICD-10-CM | POA: Insufficient documentation

## 2019-11-03 DIAGNOSIS — Z23 Encounter for immunization: Secondary | ICD-10-CM | POA: Insufficient documentation

## 2019-11-03 NOTE — Telephone Encounter (Signed)
Scheduled appt per 11/13 sch message - unable to reach pt . Left message with new appt date and time on vmail

## 2019-11-07 ENCOUNTER — Inpatient Hospital Stay: Payer: Medicare Other | Admitting: Nurse Practitioner

## 2019-11-07 ENCOUNTER — Inpatient Hospital Stay: Payer: Medicare Other

## 2019-11-07 ENCOUNTER — Encounter: Payer: Self-pay | Admitting: Nurse Practitioner

## 2019-11-07 ENCOUNTER — Other Ambulatory Visit: Payer: Self-pay

## 2019-11-07 VITALS — BP 145/91 | HR 98 | Temp 97.9°F | Resp 20 | Ht 72.0 in | Wt 224.3 lb

## 2019-11-07 DIAGNOSIS — C7A021 Malignant carcinoid tumor of the cecum: Secondary | ICD-10-CM

## 2019-11-07 DIAGNOSIS — Z23 Encounter for immunization: Secondary | ICD-10-CM

## 2019-11-07 DIAGNOSIS — K219 Gastro-esophageal reflux disease without esophagitis: Secondary | ICD-10-CM | POA: Diagnosis not present

## 2019-11-07 DIAGNOSIS — Z86010 Personal history of colonic polyps: Secondary | ICD-10-CM | POA: Diagnosis not present

## 2019-11-07 DIAGNOSIS — C7B8 Other secondary neuroendocrine tumors: Secondary | ICD-10-CM | POA: Diagnosis not present

## 2019-11-07 DIAGNOSIS — I10 Essential (primary) hypertension: Secondary | ICD-10-CM | POA: Diagnosis not present

## 2019-11-07 DIAGNOSIS — Z79899 Other long term (current) drug therapy: Secondary | ICD-10-CM | POA: Diagnosis not present

## 2019-11-07 MED ORDER — OCTREOTIDE ACETATE 30 MG IM KIT
30.0000 mg | PACK | Freq: Once | INTRAMUSCULAR | Status: AC
  Administered 2019-11-07: 13:00:00 30 mg via INTRAMUSCULAR

## 2019-11-07 MED ORDER — INFLUENZA VAC A&B SA ADJ QUAD 0.5 ML IM PRSY
PREFILLED_SYRINGE | INTRAMUSCULAR | Status: AC
  Filled 2019-11-07: qty 0.5

## 2019-11-07 MED ORDER — OCTREOTIDE ACETATE 30 MG IM KIT
PACK | INTRAMUSCULAR | Status: AC
  Filled 2019-11-07: qty 1

## 2019-11-07 MED ORDER — INFLUENZA VAC A&B SA ADJ QUAD 0.5 ML IM PRSY
0.5000 mL | PREFILLED_SYRINGE | Freq: Once | INTRAMUSCULAR | Status: AC
  Administered 2019-11-07: 0.5 mL via INTRAMUSCULAR

## 2019-11-07 NOTE — Patient Instructions (Signed)
Octreotide injection solution What is this medicine? OCTREOTIDE (ok TREE oh tide) is used to reduce blood levels of growth hormone in patients with a condition called acromegaly. This medicine also reduces flushing and watery diarrhea caused by certain types of cancer. This medicine may be used for other purposes; ask your health care provider or pharmacist if you have questions. COMMON BRAND NAME(S): Bynfezia, Sandostatin, Sandostatin LAR What should I tell my health care provider before I take this medicine? They need to know if you have any of these conditions:  diabetes  gallbladder disease  kidney disease  liver disease  an unusual or allergic reaction to octreotide, other medicines, foods, dyes, or preservatives  pregnant or trying to get pregnant  breast-feeding How should I use this medicine? This medicine is for injection under the skin or into a vein (only in emergency situations). It is usually given by a health care professional in a hospital or clinic setting. If you get this medicine at home, you will be taught how to prepare and give this medicine. Allow the injection solution to come to room temperature before use. Do not warm it artificially. Use exactly as directed. Take your medicine at regular intervals. Do not take your medicine more often than directed. It is important that you put your used needles and syringes in a special sharps container. Do not put them in a trash can. If you do not have a sharps container, call your pharmacist or healthcare provider to get one. Talk to your pediatrician regarding the use of this medicine in children. Special care may be needed. Overdosage: If you think you have taken too much of this medicine contact a poison control center or emergency room at once. NOTE: This medicine is only for you. Do not share this medicine with others. What if I miss a dose? If you miss a dose, take it as soon as you can. If it is almost time for your  next dose, take only that dose. Do not take double or extra doses. What may interact with this medicine? Do not take this medicine with any of the following medications:  cisapride  droperidol  general anesthetics  grepafloxacin  perphenazine  thioridazine This medicine may also interact with the following medications:  bromocriptine  cyclosporine  diuretics  medicines for blood pressure, heart disease, irregular heart beat  medicines for diabetes, including insulin  quinidine This list may not describe all possible interactions. Give your health care provider a list of all the medicines, herbs, non-prescription drugs, or dietary supplements you use. Also tell them if you smoke, drink alcohol, or use illegal drugs. Some items may interact with your medicine. What should I watch for while using this medicine? Visit your doctor or health care professional for regular checks on your progress. To help reduce irritation at the injection site, use a different site for each injection and make sure the solution is at room temperature before use. This medicine may cause decreases in blood sugar. Signs of low blood sugar include chills, cool, pale skin or cold sweats, drowsiness, extreme hunger, fast heartbeat, headache, nausea, nervousness or anxiety, shakiness, trembling, unsteadiness, tiredness, or weakness. Contact your doctor or health care professional right away if you experience any of these symptoms. This medicine may increase blood sugar. Ask your healthcare provider if changes in diet or medicines are needed if you have diabetes. This medicine may cause a decrease in vitamin B12. You should make sure that you get enough vitamin B12  while you are taking this medicine. Discuss the foods you eat and the vitamins you take with your health care professional. What side effects may I notice from receiving this medicine? Side effects that you should report to your doctor or health care  professional as soon as possible:  allergic reactions like skin rash, itching or hives, swelling of the face, lips, or tongue  decreases in blood sugar  changes in heart rate  severe stomach pain   signs and symptoms of high blood sugar such as being more thirsty or hungry or having to urinate more than normal. You may also feel very tired or have blurry vision. Side effects that usually do not require medical attention (report to your doctor or health care professional if they continue or are bothersome):  diarrhea or constipation  gas or stomach pain  nausea, vomiting  pain, redness, swelling and irritation at site where injected This list may not describe all possible side effects. Call your doctor for medical advice about side effects. You may report side effects to FDA at 1-800-FDA-1088. Where should I keep my medicine? Keep out of the reach of children. Store in a refrigerator between 2 and 8 degrees C (36 and 46 degrees F). Protect from light. Allow to come to room temperature naturally. Do not use artificial heat. If protected from light, the injection may be stored at room temperature between 20 and 30 degrees C (70 and 86 degrees F) for 14 days. After the initial use, throw away any unused portion of a multiple dose vial after 14 days. Throw away unused portions of the ampules after use. NOTE: This sheet is a summary. It may not cover all possible information. If you have questions about this medicine, talk to your doctor, pharmacist, or health care provider.  2020 Elsevier/Gold Standard (2018-09-15 08:07:09) Influenza (Flu) Vaccine (Inactivated or Recombinant): What You Need to Know 1. Why get vaccinated? Influenza vaccine can prevent influenza (flu). Flu is a contagious disease that spreads around the [place] every year, usually between October and May. Anyone can get the flu, but it is more dangerous for some people. Infants and young children, people 6 years of age  and older, pregnant women, and people with certain health conditions or a weakened immune system are at greatest risk of flu complications. Pneumonia, bronchitis, sinus infections and ear infections are examples of flu-related complications. If you have a medical condition, such as heart disease, cancer or diabetes, flu can make it worse. Flu can cause fever and chills, sore throat, muscle aches, fatigue, cough, headache, and runny or stuffy nose. Some people may have vomiting and diarrhea, though this is more common in children than adults. Each year thousands of people in the [place] die from flu, and many more are hospitalized. Flu vaccine prevents millions of illnesses and flu-related visits to the doctor each year. 2. Influenza vaccine CDC recommends everyone 23 months of age and older get vaccinated every flu season. Children 6 months through 5 years of age may need 2 doses during a single flu season. Everyone else needs only 1 dose each flu season. It takes about 2 weeks for protection to develop after vaccination. There are many flu viruses, and they are always changing. Each year a new flu vaccine is made to protect against three or four viruses that are likely to cause disease in the upcoming flu season. Even when the vaccine doesn't exactly match these viruses, it may still provide some protection. Influenza vaccine  does not cause flu. Influenza vaccine may be given at the same time as other vaccines. 3. Talk with your health care provider Tell your vaccine provider if the person getting the vaccine:  Has had an allergic reaction after a previous dose of influenza vaccine, or has any severe, life-threatening allergies.  Has ever had Guillain-Barr Syndrome (also called GBS). In some cases, your health care provider may decide to postpone influenza vaccination to a future visit. People with minor illnesses, such as a cold, may be vaccinated. People who are moderately or severely ill  should usually wait until they recover before getting influenza vaccine. Your health care provider can give you more information. 4. Risks of a vaccine reaction  Soreness, redness, and swelling where shot is given, fever, muscle aches, and headache can happen after influenza vaccine.  There may be a very small increased risk of Guillain-Barr Syndrome (GBS) after inactivated influenza vaccine (the flu shot). Young children who get the flu shot along with pneumococcal vaccine (PCV13), and/or DTaP vaccine at the same time might be slightly more likely to have a seizure caused by fever. Tell your health care provider if a child who is getting flu vaccine has ever had a seizure. People sometimes faint after medical procedures, including vaccination. Tell your provider if you feel dizzy or have vision changes or ringing in the ears. As with any medicine, there is a very remote chance of a vaccine causing a severe allergic reaction, other serious injury, or death. 5. What if there is a serious problem? An allergic reaction could occur after the vaccinated person leaves the clinic. If you see signs of a severe allergic reaction (hives, swelling of the face and throat, difficulty breathing, a fast heartbeat, dizziness, or weakness), call 9-1-1 and get the person to the nearest hospital. For other signs that concern you, call your health care provider. Adverse reactions should be reported to the Vaccine Adverse Event Reporting System (VAERS). Your health care provider will usually file this report, or you can do it yourself. Visit the VAERS website at www.vaers.SamedayNews.es or call 337-389-2144.VAERS is only for reporting reactions, and VAERS staff do not give medical advice. 6. The National Vaccine Injury Compensation Program The [employer] Vaccine Injury Compensation Program (VICP) is a federal program that was created to compensate people who may have been injured by certain vaccines. Visit the VICP website at  GoldCloset.com.ee or call 843-144-1832 to learn about the program and about filing a claim. There is a time limit to file a claim for compensation. 7. How can I learn more?  Ask your healthcare provider.  Call your local or state health department.  Contact the Centers for Disease Control and Prevention (CDC): ? Call (313)231-7196 (1-800-CDC-INFO) or ? Visit CDC's https://gibson.com/ Vaccine Information Statement (Interim) Inactivated Influenza Vaccine (08/04/2018) This information is not intended to replace advice given to you by your health care provider. Make sure you discuss any questions you have with your health care provider. Document Released: 10/01/2006 Document Revised: 03/28/2019 Document Reviewed: 08/08/2018 Elsevier Patient Education  2020 [employer].

## 2019-11-07 NOTE — Progress Notes (Signed)
Tommy Conley PROGRESS NOTE   Diagnosis: Carcinoid tumor  INTERVAL HISTORY:   Tommy Conley returns for follow-up.  He continues monthly Sandostatin.  He feels well.  No diarrhea.  No abdominal pain.  No nausea or vomiting.  No flushing episodes.  Objective:  Vital signs in last 24 hours:  Blood pressure (!) 145/91, pulse 98, temperature 97.9 F (36.6 C), resp. rate 20, height 6' (1.829 m), weight 224 lb 4.8 oz (101.7 kg), SpO2 100 %.    HEENT: Neck without mass. Lymphatics: No palpable cervical, supraclavicular lymph nodes. GI: Abdomen soft and nontender.  Fullness in the right upper abdomen, no obvious hepatomegaly.  No splenomegaly. Vascular: No leg edema.    Lab Results:  Lab Results  Component Value Date   WBC 5.7 08/03/2019   HGB 12.1 (L) 08/03/2019   HCT 37.7 (L) 08/03/2019   MCV 87.3 08/03/2019   PLT 136 (L) 08/03/2019   NEUTROABS 3.8 08/03/2019    Imaging:  No results found.  Medications: I have reviewed the patient's current medications.  Assessment/Plan: 1. 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  1. Status post a right colon/ileum resection on 09/29/2011.  2. Wedge biopsy of a right liver lesion on 09/29/2011 confirmed metastatic well-differentiated neuroendocrine carcinoma.  3. CT of the abdomen 08/25/2011 consistent with multiple liver metastases.  4. Elevated preoperative chromogranin A level and a 24-hour urine 5-HIAA level. Stable on repeat 11/16/2011. 5. Restaging CT on 12/28/2011 confirmed slight progression of the metastatic liver lesions. 6. Status post 3 cycles of Xeloda/temozolomide with cycle 3 initiated on 03/12/2012 7. Restaging CT of the abdomen 04/04/2012 revealed slight progression of the metastatic liver lesions and transverse mesocolon adenopathy. 8. Status post Y-90 radioembolization of the right liver on 05/19/2012. 9. Status  post Y-90 radioembolization of the left liver on 07/06/2012. 10. Chromogranin A level decreased on 08/29/2012, 24-hour urine 5 HIAA slightly increased on 09/05/2012 11. Restaging CT on 12/05/2012 with a mixed response in the liver 12. Restaging CT 08/11/2013-mild increase in the size of liver lesions. 13. Restaging CT 01/22/2014-stable to slightly improved liver lesions, mild increase in mesenteric adenopathy. Chromogranin A level stable. 14. Chromogranin A level stable 05/01/2014. 15. Chromogranin A higher on 08/02/2014 16. Restaging CT 11/06/2014 with interval enlargement of the enhancing lesions within the left and right hepatic lobe. 17. Initiation of monthly Sandostatin 12/24/2014 18. Status post Y-90 radioembolization of the right liver on 01/03/2015 19. Chromogranin A level stable 02/18/2015 20. Chromogranin A level improved 08/12/2015 21. Restaging CT 09/11/2015 with mild increase in the size of the dominant liver metastasis in the right hepatic lobe. Other smaller liver metastases showed no significant change. Stable mild lymphadenopathy in central small bowel mesentery and right cardiophrenic angle. Stable subcm right middle lobe pulmonary nodule. 22. Monthly Sandostatin continued 23. Gallium-DOTATATE01/25/2018-increased activity of a dominant right liver mass and left liver lesion. Increased tracer accumulation in a mesenteric mass 24. Cycle 1Lutathera 03/30/2018 25. Cycle 2 Lutathera 05/25/2018 26. Cycle 3 Lutathera 07/20/2018 27. Cycle 4 Lutathera 09/14/2018 28. Restaging Netspot 10/18/2018- current scan very similar to pretherapy dotatate PET scan. No evidence of new or progressive neuroendocrine tumor. No enlarged tumor sites. 2. History of diarrhea (3 to 4 times per day), predating surgery for 8 months. Decrease in daily bowel movements coinciding with discontinuation of Nexium. 3. Hypertension. 4. History of gastroesophageal reflux disease. No reflux symptoms since discontinuing  Nexium. 5. Rectal and ascending colon polyps.  He underwent a colonoscopy on 12/20/2012 with findings of a sessile polyp in the ascending colon and a sessile polyp in the rectum. Pathology showed hyperplastic polyps. Prior right colon resection noted with a normal appearing ileocolonic anastomosis. Small internal hemorrhoids noted. Repeat colonoscopy in 5 years. 6. History of Iron deficiency anemia 7. 08/12/2016 syncopal episode status post evaluation in the emergency department 8. Left-sided hearing loss July 2020   Disposition: [name] appears well.  There is no clinical evidence of disease progression.  Plan to continue monthly Sandostatin.  We will follow up with a chromogranin A level from today.  He will return for a follow-up visit in 3 months.    Ned Card ANP/GNP-BC   11/07/2019  1:12 PM

## 2019-11-08 ENCOUNTER — Telehealth: Payer: Self-pay | Admitting: Oncology

## 2019-11-08 LAB — CHROMOGRANIN A: Chromogranin A (ng/mL): 2519 ng/mL — ABNORMAL HIGH (ref 0.0–101.8)

## 2019-11-08 NOTE — Telephone Encounter (Signed)
Scheduled per los. Called and spoke with patient. Confirmed appt 

## 2019-11-09 ENCOUNTER — Telehealth: Payer: Self-pay | Admitting: *Deleted

## 2019-11-09 NOTE — Telephone Encounter (Signed)
Per Ned Card, NP, called to make pt aware the chromogranin A is higher. A repeat will be done at next office visit and if higher again, a Netspot will be ordered. Pt verbalized understanding.

## 2019-11-09 NOTE — Telephone Encounter (Signed)
-----   Message from Owens Shark, NP sent at 11/09/2019  8:40 AM EST ----- Please let him know the chromogranin A is higher.  We will repeat at time of next office visit and order Netspot if higher again.

## 2019-11-21 DIAGNOSIS — Z79899 Other long term (current) drug therapy: Secondary | ICD-10-CM | POA: Diagnosis not present

## 2019-11-21 DIAGNOSIS — I10 Essential (primary) hypertension: Secondary | ICD-10-CM | POA: Diagnosis not present

## 2019-11-21 DIAGNOSIS — Z13220 Encounter for screening for lipoid disorders: Secondary | ICD-10-CM | POA: Diagnosis not present

## 2019-11-22 LAB — LIPID PANEL
Chol/HDL Ratio: 2.9 ratio (ref 0.0–5.0)
Cholesterol, Total: 154 mg/dL (ref 100–199)
HDL: 54 mg/dL (ref >39)
LDL Chol Calc (NIH): 87 mg/dL (ref 0–99)
Triglycerides: 65 mg/dL (ref 0–149)
VLDL Cholesterol Cal: 13 mg/dL (ref 5–40)

## 2019-11-22 LAB — BASIC METABOLIC PANEL
BUN/Creatinine Ratio: 13 (ref 10–24)
BUN: 15 mg/dL (ref 8–27)
CO2: 25 mmol/L (ref 20–29)
Calcium: 9.1 mg/dL (ref 8.6–10.2)
Chloride: 103 mmol/L (ref 96–106)
Creatinine, Ser: 1.14 mg/dL (ref 0.76–1.27)
GFR calc Af Amer: 76 mL/min/{1.73_m2} (ref >59)
GFR calc non Af Amer: 66 mL/min/{1.73_m2} (ref >59)
Glucose: 92 mg/dL (ref 65–99)
Potassium: 3.5 mmol/L (ref 3.5–5.2)
Sodium: 141 mmol/L (ref 134–144)

## 2019-11-22 LAB — PSA: Prostate Specific Ag, Serum: 2.1 ng/mL (ref 0.0–4.0)

## 2019-11-22 LAB — HEPATIC FUNCTION PANEL
ALT: 14 IU/L (ref 0–44)
AST: 16 IU/L (ref 0–40)
Albumin: 4 g/dL (ref 3.8–4.8)
Alkaline Phosphatase: 117 IU/L (ref 39–117)
Bilirubin Total: 0.5 mg/dL (ref 0.0–1.2)
Bilirubin, Direct: 0.19 mg/dL (ref 0.00–0.40)
Total Protein: 6.4 g/dL (ref 6.0–8.5)

## 2019-11-26 ENCOUNTER — Other Ambulatory Visit: Payer: Self-pay | Admitting: Oncology

## 2019-11-26 ENCOUNTER — Encounter: Payer: Self-pay | Admitting: Family Medicine

## 2019-12-07 ENCOUNTER — Inpatient Hospital Stay: Payer: Medicare Other | Attending: Nurse Practitioner

## 2019-12-07 ENCOUNTER — Other Ambulatory Visit: Payer: Self-pay

## 2019-12-07 VITALS — BP 142/72 | HR 78 | Temp 98.2°F | Resp 18

## 2019-12-07 DIAGNOSIS — C7A021 Malignant carcinoid tumor of the cecum: Secondary | ICD-10-CM | POA: Insufficient documentation

## 2019-12-07 DIAGNOSIS — Z79899 Other long term (current) drug therapy: Secondary | ICD-10-CM | POA: Insufficient documentation

## 2019-12-07 MED ORDER — OCTREOTIDE ACETATE 30 MG IM KIT
PACK | INTRAMUSCULAR | Status: AC
  Filled 2019-12-07: qty 1

## 2019-12-07 MED ORDER — OCTREOTIDE ACETATE 30 MG IM KIT
30.0000 mg | PACK | Freq: Once | INTRAMUSCULAR | Status: AC
  Administered 2019-12-07: 10:00:00 30 mg via INTRAMUSCULAR

## 2019-12-07 NOTE — Patient Instructions (Signed)
Octreotide injection solution What is this medicine? OCTREOTIDE (ok TREE oh tide) is used to reduce blood levels of growth hormone in patients with a condition called acromegaly. This medicine also reduces flushing and watery diarrhea caused by certain types of cancer. This medicine may be used for other purposes; ask your health care provider or pharmacist if you have questions. COMMON BRAND NAME(S): Bynfezia, Sandostatin, Sandostatin LAR What should I tell my health care provider before I take this medicine? They need to know if you have any of these conditions:  diabetes  gallbladder disease  kidney disease  liver disease  an unusual or allergic reaction to octreotide, other medicines, foods, dyes, or preservatives  pregnant or trying to get pregnant  breast-feeding How should I use this medicine? This medicine is for injection under the skin or into a vein (only in emergency situations). It is usually given by a health care professional in a hospital or clinic setting. If you get this medicine at home, you will be taught how to prepare and give this medicine. Allow the injection solution to come to room temperature before use. Do not warm it artificially. Use exactly as directed. Take your medicine at regular intervals. Do not take your medicine more often than directed. It is important that you put your used needles and syringes in a special sharps container. Do not put them in a trash can. If you do not have a sharps container, call your pharmacist or healthcare provider to get one. Talk to your pediatrician regarding the use of this medicine in children. Special care may be needed. Overdosage: If you think you have taken too much of this medicine contact a poison control center or emergency room at once. NOTE: This medicine is only for you. Do not share this medicine with others. What if I miss a dose? If you miss a dose, take it as soon as you can. If it is almost time for your  next dose, take only that dose. Do not take double or extra doses. What may interact with this medicine? Do not take this medicine with any of the following medications:  cisapride  droperidol  general anesthetics  grepafloxacin  perphenazine  thioridazine This medicine may also interact with the following medications:  bromocriptine  cyclosporine  diuretics  medicines for blood pressure, heart disease, irregular heart beat  medicines for diabetes, including insulin  quinidine This list may not describe all possible interactions. Give your health care provider a list of all the medicines, herbs, non-prescription drugs, or dietary supplements you use. Also tell them if you smoke, drink alcohol, or use illegal drugs. Some items may interact with your medicine. What should I watch for while using this medicine? Visit your doctor or health care professional for regular checks on your progress. To help reduce irritation at the injection site, use a different site for each injection and make sure the solution is at room temperature before use. This medicine may cause decreases in blood sugar. Signs of low blood sugar include chills, cool, pale skin or cold sweats, drowsiness, extreme hunger, fast heartbeat, headache, nausea, nervousness or anxiety, shakiness, trembling, unsteadiness, tiredness, or weakness. Contact your doctor or health care professional right away if you experience any of these symptoms. This medicine may increase blood sugar. Ask your healthcare provider if changes in diet or medicines are needed if you have diabetes. This medicine may cause a decrease in vitamin B12. You should make sure that you get enough vitamin B12  while you are taking this medicine. Discuss the foods you eat and the vitamins you take with your health care professional. What side effects may I notice from receiving this medicine? Side effects that you should report to your doctor or health care  professional as soon as possible:  allergic reactions like skin rash, itching or hives, swelling of the face, lips, or tongue  decreases in blood sugar  changes in heart rate  severe stomach pain   signs and symptoms of high blood sugar such as being more thirsty or hungry or having to urinate more than normal. You may also feel very tired or have blurry vision. Side effects that usually do not require medical attention (report to your doctor or health care professional if they continue or are bothersome):  diarrhea or constipation  gas or stomach pain  nausea, vomiting  pain, redness, swelling and irritation at site where injected This list may not describe all possible side effects. Call your doctor for medical advice about side effects. You may report side effects to FDA at 1-800-FDA-1088. Where should I keep my medicine? Keep out of the reach of children. Store in a refrigerator between 2 and 8 degrees C (36 and 46 degrees F). Protect from light. Allow to come to room temperature naturally. Do not use artificial heat. If protected from light, the injection may be stored at room temperature between 20 and 30 degrees C (70 and 86 degrees F) for 14 days. After the initial use, throw away any unused portion of a multiple dose vial after 14 days. Throw away unused portions of the ampules after use. NOTE: This sheet is a summary. It may not cover all possible information. If you have questions about this medicine, talk to your doctor, pharmacist, or health care provider.  2020 Elsevier/Gold Standard (2018-09-15 08:07:09) Influenza (Flu) Vaccine (Inactivated or Recombinant): What You Need to Know 1. Why get vaccinated? Influenza vaccine can prevent influenza (flu). Flu is a contagious disease that spreads around the [place] every year, usually between October and May. Anyone can get the flu, but it is more dangerous for some people. Infants and young children, people 6 years of age  and older, pregnant women, and people with certain health conditions or a weakened immune system are at greatest risk of flu complications. Pneumonia, bronchitis, sinus infections and ear infections are examples of flu-related complications. If you have a medical condition, such as heart disease, cancer or diabetes, flu can make it worse. Flu can cause fever and chills, sore throat, muscle aches, fatigue, cough, headache, and runny or stuffy nose. Some people may have vomiting and diarrhea, though this is more common in children than adults. Each year thousands of people in the [place] die from flu, and many more are hospitalized. Flu vaccine prevents millions of illnesses and flu-related visits to the doctor each year. 2. Influenza vaccine CDC recommends everyone 23 months of age and older get vaccinated every flu season. Children 6 months through 5 years of age may need 2 doses during a single flu season. Everyone else needs only 1 dose each flu season. It takes about 2 weeks for protection to develop after vaccination. There are many flu viruses, and they are always changing. Each year a new flu vaccine is made to protect against three or four viruses that are likely to cause disease in the upcoming flu season. Even when the vaccine doesn't exactly match these viruses, it may still provide some protection. Influenza vaccine  does not cause flu. Influenza vaccine may be given at the same time as other vaccines. 3. Talk with your health care provider Tell your vaccine provider if the person getting the vaccine:  Has had an allergic reaction after a previous dose of influenza vaccine, or has any severe, life-threatening allergies.  Has ever had Guillain-Barr Syndrome (also called GBS). In some cases, your health care provider may decide to postpone influenza vaccination to a future visit. People with minor illnesses, such as a cold, may be vaccinated. People who are moderately or severely ill  should usually wait until they recover before getting influenza vaccine. Your health care provider can give you more information. 4. Risks of a vaccine reaction  Soreness, redness, and swelling where shot is given, fever, muscle aches, and headache can happen after influenza vaccine.  There may be a very small increased risk of Guillain-Barr Syndrome (GBS) after inactivated influenza vaccine (the flu shot). Young children who get the flu shot along with pneumococcal vaccine (PCV13), and/or DTaP vaccine at the same time might be slightly more likely to have a seizure caused by fever. Tell your health care provider if a child who is getting flu vaccine has ever had a seizure. People sometimes faint after medical procedures, including vaccination. Tell your provider if you feel dizzy or have vision changes or ringing in the ears. As with any medicine, there is a very remote chance of a vaccine causing a severe allergic reaction, other serious injury, or death. 5. What if there is a serious problem? An allergic reaction could occur after the vaccinated person leaves the clinic. If you see signs of a severe allergic reaction (hives, swelling of the face and throat, difficulty breathing, a fast heartbeat, dizziness, or weakness), call 9-1-1 and get the person to the nearest hospital. For other signs that concern you, call your health care provider. Adverse reactions should be reported to the Vaccine Adverse Event Reporting System (VAERS). Your health care provider will usually file this report, or you can do it yourself. Visit the VAERS website at www.vaers.SamedayNews.es or call 337-389-2144.VAERS is only for reporting reactions, and VAERS staff do not give medical advice. 6. The National Vaccine Injury Compensation Program The [employer] Vaccine Injury Compensation Program (VICP) is a federal program that was created to compensate people who may have been injured by certain vaccines. Visit the VICP website at  GoldCloset.com.ee or call 843-144-1832 to learn about the program and about filing a claim. There is a time limit to file a claim for compensation. 7. How can I learn more?  Ask your healthcare provider.  Call your local or state health department.  Contact the Centers for Disease Control and Prevention (CDC): ? Call (313)231-7196 (1-800-CDC-INFO) or ? Visit CDC's https://gibson.com/ Vaccine Information Statement (Interim) Inactivated Influenza Vaccine (08/04/2018) This information is not intended to replace advice given to you by your health care provider. Make sure you discuss any questions you have with your health care provider. Document Released: 10/01/2006 Document Revised: 03/28/2019 Document Reviewed: 08/08/2018 Elsevier Patient Education  2020 [employer].

## 2019-12-11 ENCOUNTER — Other Ambulatory Visit: Payer: Self-pay | Admitting: Family Medicine

## 2020-01-08 ENCOUNTER — Inpatient Hospital Stay: Payer: Medicare Other | Attending: Nurse Practitioner

## 2020-01-08 ENCOUNTER — Other Ambulatory Visit: Payer: Self-pay

## 2020-01-08 VITALS — BP 130/77 | HR 84 | Temp 98.2°F | Resp 18

## 2020-01-08 DIAGNOSIS — Z79899 Other long term (current) drug therapy: Secondary | ICD-10-CM | POA: Insufficient documentation

## 2020-01-08 DIAGNOSIS — C7A021 Malignant carcinoid tumor of the cecum: Secondary | ICD-10-CM | POA: Diagnosis not present

## 2020-01-08 MED ORDER — OCTREOTIDE ACETATE 30 MG IM KIT
30.0000 mg | PACK | Freq: Once | INTRAMUSCULAR | Status: AC
  Administered 2020-01-08: 11:00:00 30 mg via INTRAMUSCULAR

## 2020-01-08 MED ORDER — OCTREOTIDE ACETATE 30 MG IM KIT
PACK | INTRAMUSCULAR | Status: AC
  Filled 2020-01-08: qty 1

## 2020-01-08 NOTE — Patient Instructions (Signed)
Octreotide injection solution What is this medicine? OCTREOTIDE (ok TREE oh tide) is used to reduce blood levels of growth hormone in patients with a condition called acromegaly. This medicine also reduces flushing and watery diarrhea caused by certain types of cancer. This medicine may be used for other purposes; ask your health care provider or pharmacist if you have questions. COMMON BRAND NAME(S): Bynfezia, Sandostatin What should I tell my health care provider before I take this medicine? They need to know if you have any of these conditions:  diabetes  gallbladder disease  kidney disease  liver disease  thyroid disease  an unusual or allergic reaction to octreotide, other medicines, foods, dyes, or preservatives  pregnant or trying to get pregnant  breast-feeding How should I use this medicine? This medicine is for injection under the skin or into a vein (only in emergency situations). It is usually given by a health care professional in a hospital or clinic setting. If you get this medicine at home, you will be taught how to prepare and give this medicine. Allow the injection solution to come to room temperature before use. Do not warm it artificially. Use exactly as directed. Take your medicine at regular intervals. Do not take your medicine more often than directed. It is important that you put your used needles and syringes in a special sharps container. Do not put them in a trash can. If you do not have a sharps container, call your pharmacist or healthcare provider to get one. Talk to your pediatrician regarding the use of this medicine in children. Special care may be needed. Overdosage: If you think you have taken too much of this medicine contact a poison control center or emergency room at once. NOTE: This medicine is only for you. Do not share this medicine with others. What if I miss a dose? If you miss a dose, take it as soon as you can. If it is almost time for your  next dose, take only that dose. Do not take double or extra doses. What may interact with this medicine?  bromocriptine  certain medicines for blood pressure, heart disease, irregular heartbeat  cyclosporine  diuretics  medicines for diabetes, including insulin  quinidine This list may not describe all possible interactions. Give your health care provider a list of all the medicines, herbs, non-prescription drugs, or dietary supplements you use. Also tell them if you smoke, drink alcohol, or use illegal drugs. Some items may interact with your medicine. What should I watch for while using this medicine? Visit your doctor or health care professional for regular checks on your progress. To help reduce irritation at the injection site, use a different site for each injection and make sure the solution is at room temperature before use. This medicine may cause decreases in blood sugar. Signs of low blood sugar include chills, cool, pale skin or cold sweats, drowsiness, extreme hunger, fast heartbeat, headache, nausea, nervousness or anxiety, shakiness, trembling, unsteadiness, tiredness, or weakness. Contact your doctor or health care professional right away if you experience any of these symptoms. This medicine may increase blood sugar. Ask your healthcare provider if changes in diet or medicines are needed if you have diabetes. This medicine may cause a decrease in vitamin B12. You should make sure that you get enough vitamin B12 while you are taking this medicine. Discuss the foods you eat and the vitamins you take with your health care professional. What side effects may I notice from receiving this medicine? Side   effects that you should report to your doctor or health care professional as soon as possible:  allergic reactions like skin rash, itching or hives, swelling of the face, lips, or tongue  fast, slow, or irregular heartbeat  right upper belly pain  severe stomach pain  signs  and symptoms of high blood sugar such as being more thirsty or hungry or having to urinate more than normal. You may also feel very tired or have blurry vision.  signs and symptoms of low blood sugar such as feeling anxious; confusion; dizziness; increased hunger; unusually weak or tired; increased sweating; shakiness; cold, clammy skin; irritable; headache; blurred vision; fast heartbeat; loss of consciousness  unusually weak or tired Side effects that usually do not require medical attention (report to your doctor or health care professional if they continue or are bothersome):  diarrhea  dizziness  gas  headache  nausea, vomiting  pain, redness, or irritation at site where injected  upset stomach This list may not describe all possible side effects. Call your doctor for medical advice about side effects. You may report side effects to FDA at 1-800-FDA-1088. Where should I keep my medicine? Keep out of the reach of children. Store in a refrigerator between 2 and 8 degrees C (36 and 46 degrees F). Protect from light. Allow to come to room temperature naturally. Do not use artificial heat. If protected from light, the injection may be stored at room temperature between 20 and 30 degrees C (70 and 86 degrees F) for 14 days. After the initial use, throw away any unused portion of a multiple dose vial after 14 days. Throw away unused portions of the ampules after use. NOTE: This sheet is a summary. It may not cover all possible information. If you have questions about this medicine, talk to your doctor, pharmacist, or health care provider.  2020 Elsevier/Gold Standard (2019-07-06 13:33:09)  

## 2020-01-25 ENCOUNTER — Encounter: Payer: Self-pay | Admitting: Family Medicine

## 2020-01-28 ENCOUNTER — Ambulatory Visit: Payer: Medicare Other | Attending: Internal Medicine

## 2020-01-28 ENCOUNTER — Other Ambulatory Visit: Payer: Self-pay

## 2020-01-28 DIAGNOSIS — Z23 Encounter for immunization: Secondary | ICD-10-CM

## 2020-01-28 NOTE — Progress Notes (Signed)
   Covid-[facility] Vaccination Clinic  Name:  Tommy Conley    MRN: [id number] DOB: 01-Aug-1951  01/28/2020  Tommy Conley was observed post Covid-19 immunization for 15 minutes without incidence. He was provided with Vaccine Information Sheet and instruction to access the V-Safe system.   Tommy Conley was instructed to call 911 with any severe reactions post vaccine: [name] Difficulty breathing  . Swelling of your face and throat  . A fast heartbeat  . A bad rash all over your body  . Dizziness and weakness    Immunizations Administered    Name Date Dose VIS Date Route   Moderna COVID-19 Vaccine 01/28/2020  2:32 PM 0.5 mL 11/21/2019 Intramuscular   Manufacturer: Moderna   Lot: [id number]   [id number]

## 2020-02-07 ENCOUNTER — Telehealth: Payer: Self-pay | Admitting: Oncology

## 2020-02-07 NOTE — Telephone Encounter (Signed)
R/s from 2/18 due to bad weather. Pt is aware of new appt date and time.

## 2020-02-08 ENCOUNTER — Inpatient Hospital Stay: Payer: Medicare Other

## 2020-02-08 ENCOUNTER — Inpatient Hospital Stay: Payer: Medicare Other | Admitting: Oncology

## 2020-02-13 DIAGNOSIS — H905 Unspecified sensorineural hearing loss: Secondary | ICD-10-CM | POA: Diagnosis not present

## 2020-02-14 ENCOUNTER — Other Ambulatory Visit: Payer: Self-pay | Admitting: Family Medicine

## 2020-02-15 ENCOUNTER — Inpatient Hospital Stay (HOSPITAL_BASED_OUTPATIENT_CLINIC_OR_DEPARTMENT_OTHER): Payer: Medicare Other | Admitting: Nurse Practitioner

## 2020-02-15 ENCOUNTER — Other Ambulatory Visit: Payer: Self-pay

## 2020-02-15 ENCOUNTER — Encounter: Payer: Self-pay | Admitting: Nurse Practitioner

## 2020-02-15 ENCOUNTER — Inpatient Hospital Stay: Payer: Medicare Other

## 2020-02-15 ENCOUNTER — Other Ambulatory Visit: Payer: Self-pay | Admitting: Family Medicine

## 2020-02-15 ENCOUNTER — Inpatient Hospital Stay: Payer: Medicare Other | Attending: Nurse Practitioner

## 2020-02-15 VITALS — BP 125/79 | HR 90 | Temp 97.8°F | Resp 20 | Ht 72.0 in | Wt 214.4 lb

## 2020-02-15 DIAGNOSIS — I10 Essential (primary) hypertension: Secondary | ICD-10-CM | POA: Insufficient documentation

## 2020-02-15 DIAGNOSIS — K219 Gastro-esophageal reflux disease without esophagitis: Secondary | ICD-10-CM | POA: Diagnosis not present

## 2020-02-15 DIAGNOSIS — R634 Abnormal weight loss: Secondary | ICD-10-CM | POA: Insufficient documentation

## 2020-02-15 DIAGNOSIS — D509 Iron deficiency anemia, unspecified: Secondary | ICD-10-CM | POA: Diagnosis not present

## 2020-02-15 DIAGNOSIS — K648 Other hemorrhoids: Secondary | ICD-10-CM | POA: Diagnosis not present

## 2020-02-15 DIAGNOSIS — C7B8 Other secondary neuroendocrine tumors: Secondary | ICD-10-CM | POA: Insufficient documentation

## 2020-02-15 DIAGNOSIS — C7A021 Malignant carcinoid tumor of the cecum: Secondary | ICD-10-CM | POA: Diagnosis not present

## 2020-02-15 DIAGNOSIS — Z79899 Other long term (current) drug therapy: Secondary | ICD-10-CM | POA: Insufficient documentation

## 2020-02-15 DIAGNOSIS — Z86010 Personal history of colonic polyps: Secondary | ICD-10-CM | POA: Diagnosis not present

## 2020-02-15 DIAGNOSIS — R350 Frequency of micturition: Secondary | ICD-10-CM | POA: Insufficient documentation

## 2020-02-15 MED ORDER — OCTREOTIDE ACETATE 30 MG IM KIT
30.0000 mg | PACK | Freq: Once | INTRAMUSCULAR | Status: AC
  Administered 2020-02-15: 30 mg via INTRAMUSCULAR

## 2020-02-15 MED ORDER — OCTREOTIDE ACETATE 30 MG IM KIT
PACK | INTRAMUSCULAR | Status: AC
  Filled 2020-02-15: qty 1

## 2020-02-15 NOTE — Patient Instructions (Signed)
Octreotide injection solution What is this medicine? OCTREOTIDE (ok TREE oh tide) is used to reduce blood levels of growth hormone in patients with a condition called acromegaly. This medicine also reduces flushing and watery diarrhea caused by certain types of cancer. This medicine may be used for other purposes; ask your health care provider or pharmacist if you have questions. COMMON BRAND NAME(S): Bynfezia, Sandostatin What should I tell my health care provider before I take this medicine? They need to know if you have any of these conditions:  diabetes  gallbladder disease  kidney disease  liver disease  thyroid disease  an unusual or allergic reaction to octreotide, other medicines, foods, dyes, or preservatives  pregnant or trying to get pregnant  breast-feeding How should I use this medicine? This medicine is for injection under the skin or into a vein (only in emergency situations). It is usually given by a health care professional in a hospital or clinic setting. If you get this medicine at home, you will be taught how to prepare and give this medicine. Allow the injection solution to come to room temperature before use. Do not warm it artificially. Use exactly as directed. Take your medicine at regular intervals. Do not take your medicine more often than directed. It is important that you put your used needles and syringes in a special sharps container. Do not put them in a trash can. If you do not have a sharps container, call your pharmacist or healthcare provider to get one. Talk to your pediatrician regarding the use of this medicine in children. Special care may be needed. Overdosage: If you think you have taken too much of this medicine contact a poison control center or emergency room at once. NOTE: This medicine is only for you. Do not share this medicine with others. What if I miss a dose? If you miss a dose, take it as soon as you can. If it is almost time for your  next dose, take only that dose. Do not take double or extra doses. What may interact with this medicine?  bromocriptine  certain medicines for blood pressure, heart disease, irregular heartbeat  cyclosporine  diuretics  medicines for diabetes, including insulin  quinidine This list may not describe all possible interactions. Give your health care provider a list of all the medicines, herbs, non-prescription drugs, or dietary supplements you use. Also tell them if you smoke, drink alcohol, or use illegal drugs. Some items may interact with your medicine. What should I watch for while using this medicine? Visit your doctor or health care professional for regular checks on your progress. To help reduce irritation at the injection site, use a different site for each injection and make sure the solution is at room temperature before use. This medicine may cause decreases in blood sugar. Signs of low blood sugar include chills, cool, pale skin or cold sweats, drowsiness, extreme hunger, fast heartbeat, headache, nausea, nervousness or anxiety, shakiness, trembling, unsteadiness, tiredness, or weakness. Contact your doctor or health care professional right away if you experience any of these symptoms. This medicine may increase blood sugar. Ask your healthcare provider if changes in diet or medicines are needed if you have diabetes. This medicine may cause a decrease in vitamin B12. You should make sure that you get enough vitamin B12 while you are taking this medicine. Discuss the foods you eat and the vitamins you take with your health care professional. What side effects may I notice from receiving this medicine? Side   effects that you should report to your doctor or health care professional as soon as possible:  allergic reactions like skin rash, itching or hives, swelling of the face, lips, or tongue  fast, slow, or irregular heartbeat  right upper belly pain  severe stomach pain  signs  and symptoms of high blood sugar such as being more thirsty or hungry or having to urinate more than normal. You may also feel very tired or have blurry vision.  signs and symptoms of low blood sugar such as feeling anxious; confusion; dizziness; increased hunger; unusually weak or tired; increased sweating; shakiness; cold, clammy skin; irritable; headache; blurred vision; fast heartbeat; loss of consciousness  unusually weak or tired Side effects that usually do not require medical attention (report to your doctor or health care professional if they continue or are bothersome):  diarrhea  dizziness  gas  headache  nausea, vomiting  pain, redness, or irritation at site where injected  upset stomach This list may not describe all possible side effects. Call your doctor for medical advice about side effects. You may report side effects to FDA at 1-800-FDA-1088. Where should I keep my medicine? Keep out of the reach of children. Store in a refrigerator between 2 and 8 degrees C (36 and 46 degrees F). Protect from light. Allow to come to room temperature naturally. Do not use artificial heat. If protected from light, the injection may be stored at room temperature between 20 and 30 degrees C (70 and 86 degrees F) for 14 days. After the initial use, throw away any unused portion of a multiple dose vial after 14 days. Throw away unused portions of the ampules after use. NOTE: This sheet is a summary. It may not cover all possible information. If you have questions about this medicine, talk to your doctor, pharmacist, or health care provider.  2020 Elsevier/Gold Standard (2019-07-06 13:33:09)  

## 2020-02-15 NOTE — Progress Notes (Signed)
[place] OFFICE PROGRESS NOTE   Diagnosis: Carcinoid tumor  INTERVAL HISTORY:   Mr. Derksen returns for follow-up.  He continues monthly Sandostatin.  He denies abdominal pain.  He has periodic loose stools.  No flushing episodes.  He noted increased urinary frequency last week.  He discontinued hydrochlorothiazide and noted the frequency resolved.  He denies dysuria.  No fever.  He has lost some weight since his last visit.  He attributes this to eating a healthier diet.  He reports a good appetite.  Objective:  Vital signs in last 24 hours:  Blood pressure 125/79, pulse 90, temperature 97.8 F (36.6 C), temperature source Temporal, resp. rate 20, height 6' (1.829 m), weight 214 lb 6.4 oz (97.3 kg), SpO2 100 %.    Lymphatics: No palpable cervical or supraclavicular lymph nodes. GI: Abdomen soft and nontender.  No hepatomegaly. Vascular: No leg edema.  Lab Results:  Lab Results  Component Value Date   WBC 5.7 08/03/2019   HGB 12.1 (L) 08/03/2019   HCT 37.7 (L) 08/03/2019   MCV 87.3 08/03/2019   PLT 136 (L) 08/03/2019   NEUTROABS 3.8 08/03/2019    Imaging:  No results found.  Medications: I have reviewed the patient's current medications.  Assessment/Plan: 1. 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  1. Status post a right colon/ileum resection on 09/29/2011.  2. Wedge biopsy of a right liver lesion on 09/29/2011 confirmed metastatic well-differentiated neuroendocrine carcinoma.  3. CT of the abdomen 08/25/2011 consistent with multiple liver metastases.  4. Elevated preoperative chromogranin A level and a 24-hour urine 5-HIAA level. Stable on repeat 11/16/2011. 5. Restaging CT on 12/28/2011 confirmed slight progression of the metastatic liver lesions. 6. Status post 3 cycles of Xeloda/temozolomide with cycle 3 initiated on 03/12/2012 7. Restaging CT of the abdomen  04/04/2012 revealed slight progression of the metastatic liver lesions and transverse mesocolon adenopathy. 8. Status post Y-90 radioembolization of the right liver on 05/19/2012. 9. Status post Y-90 radioembolization of the left liver on 07/06/2012. 10. Chromogranin A level decreased on 08/29/2012, 24-hour urine 5 HIAA slightly increased on 09/05/2012 11. Restaging CT on 12/05/2012 with a mixed response in the liver 12. Restaging CT 08/11/2013-mild increase in the size of liver lesions. 13. Restaging CT 01/22/2014-stable to slightly improved liver lesions, mild increase in mesenteric adenopathy. Chromogranin A level stable. 14. Chromogranin A level stable 05/01/2014. 15. Chromogranin A higher on 08/02/2014 16. Restaging CT 11/06/2014 with interval enlargement of the enhancing lesions within the left and right hepatic lobe. 17. Initiation of monthly Sandostatin 12/24/2014 18. Status post Y-90 radioembolization of the right liver on 01/03/2015 19. Chromogranin A level stable 02/18/2015 20. Chromogranin A level improved 08/12/2015 21. Restaging CT 09/11/2015 with mild increase in the size of the dominant liver metastasis in the right hepatic lobe. Other smaller liver metastases showed no significant change. Stable mild lymphadenopathy in central small bowel mesentery and right cardiophrenic angle. Stable subcm right middle lobe pulmonary nodule. 22. Monthly Sandostatin continued 23. Gallium-DOTATATE01/25/2018-increased activity of a dominant right liver mass and left liver lesion. Increased tracer accumulation in a mesenteric mass 24. Cycle 1Lutathera 03/30/2018 25. Cycle 2 Lutathera 05/25/2018 26. Cycle 3 Lutathera 07/20/2018 27. Cycle 4 Lutathera 09/14/2018 28. Restaging Netspot 10/18/2018-current scan very similar to pretherapy dotatate PET scan. No evidence of new or progressive neuroendocrine tumor. No enlarged tumor sites. 2. History of diarrhea (3 to 4 times per day), predating surgery for  8 months.  Decrease in daily bowel movements coinciding with discontinuation of Nexium. 3. Hypertension. 4. History of gastroesophageal reflux disease. No reflux symptoms since discontinuing Nexium. 5. Rectal and ascending colon polyps. He underwent a colonoscopy on 12/20/2012 with findings of a sessile polyp in the ascending colon and a sessile polyp in the rectum. Pathology showed hyperplastic polyps. Prior right colon resection noted with a normal appearing ileocolonic anastomosis. Small internal hemorrhoids noted. Repeat colonoscopy in 5 years. 6. History of Iron deficiency anemia 7. 08/12/2016 syncopal episode status post evaluation in the emergency department 8. Left-sided hearing loss July 2020  Disposition: Tommy Conley appears stable.  There is no clinical evidence of disease progression.  Plan to continue monthly Sandostatin.  We will follow-up on the chromogranin A level from today.  If higher the plan is to refer him for a restaging dotatate PET scan.  He will return for lab and follow-up in 3 months.  He will contact the office in the interim with any problems.    Ned Card ANP/GNP-BC   02/15/2020  11:54 AM

## 2020-02-16 LAB — CHROMOGRANIN A: Chromogranin A (ng/mL): 3874 ng/mL — ABNORMAL HIGH (ref 0.0–101.8)

## 2020-02-19 ENCOUNTER — Telehealth: Payer: Self-pay | Admitting: Oncology

## 2020-02-19 NOTE — Telephone Encounter (Signed)
Scheduled per 2/25 los. Called and left msg. Mailed printout

## 2020-02-20 ENCOUNTER — Telehealth: Payer: Self-pay | Admitting: *Deleted

## 2020-02-20 ENCOUNTER — Other Ambulatory Visit: Payer: Self-pay | Admitting: Nurse Practitioner

## 2020-02-20 DIAGNOSIS — C7A021 Malignant carcinoid tumor of the cecum: Secondary | ICD-10-CM

## 2020-02-20 NOTE — Telephone Encounter (Signed)
Per Ned Card, NP, called to make pt aware that his recent chromogranin A level was higher and Dr. Benay Spice recommends a NETSPOT scan. Pt verbalized understanding. Message sent to Gaspar Bidding for prior authorization

## 2020-02-20 NOTE — Telephone Encounter (Signed)
-----   Message from Owens Shark, NP sent at 02/20/2020  8:51 AM EST ----- Please let him know the recent chromogranin A level was higher.  Dr. Benay Spice recommends Netspot scan.  Order has been entered.

## 2020-02-21 ENCOUNTER — Ambulatory Visit (INDEPENDENT_AMBULATORY_CARE_PROVIDER_SITE_OTHER): Payer: Medicare Other | Admitting: Family Medicine

## 2020-02-21 ENCOUNTER — Other Ambulatory Visit: Payer: Self-pay

## 2020-02-21 ENCOUNTER — Encounter: Payer: Self-pay | Admitting: Family Medicine

## 2020-02-21 DIAGNOSIS — R350 Frequency of micturition: Secondary | ICD-10-CM

## 2020-02-21 DIAGNOSIS — R300 Dysuria: Secondary | ICD-10-CM | POA: Diagnosis not present

## 2020-02-21 NOTE — Progress Notes (Signed)
   Subjective:    Patient ID: Tommy Conley, male    DOB: 01/02/1951, 69 y.o.   MRN: [id number]  HPI Pt states he is having a reaction to HCTZ 25 mg. Pt has been taking 1/2 tablet each morning. Pt states that about a week or so he began to have urgency and the urgency continued ever after going to bathroom. Flow was also decreased. Pt has not been taking HCTZ and symptoms have eased up.  Virtual Visit via Telephone Note  I connected with Tommy Conley on 02/21/20 at 10:00 AM EST by telephone and verified that I am speaking with the correct person using two identifiers.  Location: Patient: home Provider: office   I discussed the limitations, risks, security and privacy concerns of performing an evaluation and management service by telephone and the availability of in person appointments. I also discussed with the patient that there may be a patient responsible charge related to this service. The patient expressed understanding and agreed to proceed.   History of Present Illness:    Observations/Objective:   Assessment and Plan:   Follow Up Instructions:    I discussed the assessment and treatment plan with the patient. The patient was provided an opportunity to ask questions and all were answered. The patient agreed with the plan and demonstrated an understanding of the instructions.   The patient was advised to call back or seek an in-person evaluation if the symptoms worsen or if the condition fails to improve as anticipated.  I provide 22 minutes of non-face-to-face time during this encounter.  Patient notes increased urinary frequency for about a week.  Also at times has been having penile discharge.  All this progressive in the past couple months.  Has quieted down in the last few days.  Now off the hydrochlorothiazide because he feared it was a source of his troubles  Of note blood pressure still good off of the     Review of Systems No headache no chest  pain no fever no chills    Objective:   Physical Exam  Virtual      Assessment & Plan:  Impression increased urinary frequency with penile discharge plan patient connects that all his troubles are due to the hydrochlorothiazide.  Go ahead and hold off on hydrochlorothiazide for now with good blood pressure.  Symptom care discussed.  Recommend we move his physical labs next week so we can figure out what is going questions answered

## 2020-02-27 ENCOUNTER — Other Ambulatory Visit: Payer: Self-pay

## 2020-02-27 ENCOUNTER — Encounter: Payer: Self-pay | Admitting: Family Medicine

## 2020-02-27 ENCOUNTER — Ambulatory Visit (INDEPENDENT_AMBULATORY_CARE_PROVIDER_SITE_OTHER): Payer: Medicare Other | Admitting: Family Medicine

## 2020-02-27 VITALS — BP 136/86 | Temp 97.6°F | Ht 71.5 in | Wt 216.0 lb

## 2020-02-27 DIAGNOSIS — Z0000 Encounter for general adult medical examination without abnormal findings: Secondary | ICD-10-CM

## 2020-02-27 DIAGNOSIS — N410 Acute prostatitis: Secondary | ICD-10-CM

## 2020-02-27 DIAGNOSIS — R3915 Urgency of urination: Secondary | ICD-10-CM

## 2020-02-27 DIAGNOSIS — I10 Essential (primary) hypertension: Secondary | ICD-10-CM | POA: Diagnosis not present

## 2020-02-27 LAB — POCT URINALYSIS DIPSTICK
Spec Grav, UA: 1.02 (ref 1.010–1.025)
pH, UA: 5 (ref 5.0–8.0)

## 2020-02-27 MED ORDER — AMLODIPINE BESYLATE 10 MG PO TABS: ORAL_TABLET | ORAL | 1 refills | Status: DC

## 2020-02-27 MED ORDER — METOPROLOL SUCCINATE ER 50 MG PO TB24: ORAL_TABLET | ORAL | 1 refills | Status: DC

## 2020-02-27 MED ORDER — CIPROFLOXACIN HCL 500 MG PO TABS: 500.0000 mg | ORAL_TABLET | Freq: Two times a day (BID) | ORAL | 0 refills | Status: DC

## 2020-02-27 MED ORDER — HYDRALAZINE HCL 50 MG PO TABS: 50.0000 mg | ORAL_TABLET | Freq: Three times a day (TID) | ORAL | 1 refills | Status: DC

## 2020-02-27 MED ORDER — LOSARTAN POTASSIUM 100 MG PO TABS: ORAL_TABLET | ORAL | 1 refills | Status: DC

## 2020-02-27 NOTE — Patient Instructions (Signed)
Prostatitis  Prostatitis is swelling or inflammation of the prostate gland. The prostate is a walnut-sized gland that is involved in the production of semen. It is located below a man's bladder, in front of the rectum. There are four types of prostatitis:  Chronic nonbacterial prostatitis. This is the most common type of prostatitis. It may be associated with a viral infection or autoimmune disorder.  Acute bacterial prostatitis. This is the least common type of prostatitis. It starts quickly and is usually associated with a bladder infection, high fever, and shaking chills. It can occur at any age.  Chronic bacterial prostatitis. This type usually results from acute bacterial prostatitis that happens repeatedly (is recurrent) or has not been treated properly. It can occur in men of any age but is most common among middle-aged men whose prostate has begun to get larger. The symptoms are not as severe as symptoms caused by acute bacterial prostatitis.  Prostatodynia or chronic pelvic pain syndrome (CPPS). This type is also called pelvic floor disorder. It is associated with increased muscular tone in the pelvis surrounding the prostate. What are the causes? Bacterial prostatitis is caused by infection from bacteria. Chronic nonbacterial prostatitis may be caused by:  Urinary tract infections (UTIs).  Nerve damage.  A response by the body's disease-fighting system (autoimmune response).  Chemicals in the urine. The causes of the other types of prostatitis are usually not known. What are the signs or symptoms? Symptoms of this condition vary depending upon the type of prostatitis. If you have acute bacterial prostatitis, you may experience:  Urinary symptoms, such as: ? Painful urination. ? Burning during urination. ? Frequent and sudden urges to urinate. ? Inability to start urinating. ? A weak or interrupted stream of urine.  Vomiting.  Nausea.  Fever.  Chills.  Inability to  empty the bladder completely.  Pain in the: ? Muscles or joints. ? Lower back. ? Lower abdomen. If you have any of the other types of prostatitis, you may experience:  Urinary symptoms, such as: ? Sudden urges to urinate. ? Frequent urination. ? Difficulty starting urination. ? Weak urine stream. ? Dribbling after urination.  Discharge from the urethra. The urethra is a tube that opens at the end of the penis.  Pain in the: ? Testicles. ? Penis or tip of the penis. ? Rectum. ? Area in front of the rectum and below the scrotum (perineum).  Problems with sexual function.  Painful ejaculation.  Bloody semen. How is this diagnosed? This condition may be diagnosed based on:  A physical and medical exam.  Your symptoms.  A urine test to check for bacteria.  An exam in which a health care provider uses a finger to feel the prostate (digital rectal exam).  A test of a sample of semen.  Blood tests.  Ultrasound.  Removal of prostate tissue to be examined under a microscope (biopsy).  Tests to check how your body handles urine (urodynamic tests).  A test to look inside your bladder or urethra (cystoscopy). How is this treated? Treatment for this condition depends on the type of prostatitis. Treatment may involve:  Medicines to relieve pain or inflammation.  Medicines to help relax your muscles.  Physical therapy.  Heat therapy.  Techniques to help you control certain body functions (biofeedback).  Relaxation exercises.  Antibiotic medicine, if your condition is caused by bacteria.  Warm water baths (sitz baths). Sitz baths help with relaxing your pelvic floor muscles, which helps to relieve pressure on the prostate.   Follow these instructions at home:   Take over-the-counter and prescription medicines only as told by your health care provider.  If you were prescribed an antibiotic, take it as told by your health care provider. Do not stop taking the  antibiotic even if you start to feel better.  If physical therapy, biofeedback, or relaxation exercises were prescribed, do exercises as instructed.  Take sitz baths as directed by your health care provider. For a sitz bath, sit in warm water that is deep enough to cover your hips and buttocks.  Keep all follow-up visits as told by your health care provider. This is important. Contact a health care provider if:  Your symptoms get worse.  You have a fever. Get help right away if:  You have chills.  You feel nauseous.  You vomit.  You feel light-headed or feel like you are going to faint.  You are unable to urinate.  You have blood or blood clots in your urine. This information is not intended to replace advice given to you by your health care provider. Make sure you discuss any questions you have with your health care provider. Document Revised: 02/19/2018 Document Reviewed: 08/27/2016 Elsevier Patient Education  [place]. Prostatitis  Prostatitis is swelling of the prostate gland. The prostate helps to make semen. It is below a man's bladder, in front of the rectum. There are different types of prostatitis. Follow these instructions at home:   Take over-the-counter and prescription medicines only as told by your doctor.  If you were prescribed an antibiotic medicine, take it as told by your doctor. Do not stop taking the antibiotic even if you start to feel better.  If your doctor prescribed exercises, do them as directed.  Take sitz baths as told by your doctor. To take a sitz bath, sit in warm water that is deep enough to cover your hips and butt.  Keep all follow-up visits as told by your doctor. This is important. Contact a doctor if:  Your symptoms get worse.  You have a fever. Get help right away if:  You have chills.  You feel sick to your stomach (nauseous).  You throw up (vomit).  You feel light-headed.  You feel like you might pass out  (faint).  You cannot pee (urinate).  You have blood or clumps of blood (blood clots) in your pee (urine). This information is not intended to replace advice given to you by your health care provider. Make sure you discuss any questions you have with your health care provider. Document Revised: 11/19/2017 Document Reviewed: 08/27/2016 Elsevier Patient Education  2020 [employer].

## 2020-02-27 NOTE — Progress Notes (Signed)
Subjective:    Patient ID: Tommy Conley, male    DOB: 1951/11/15, 69 y.o.   MRN: [id number]  HPI The patient comes in today for a wellness visit.  Blood pressure medicine and blood pressure levels reviewed today with patient. Compliant with blood pressure medicine. States does not miss a dose. No obvious side effects. Blood pressure generally good when checked elsewhere. Watching salt intake.     A review of their health history was completed.  A review of medications was also completed.  Any needed refills; update meds  Eating habits: health conscious  Falls/  MVA accidents in past few months: none  Regular exercise: very little  Specialist pt sees on regular basis: dr. Learta Codding Preventative health issues were discussed.   Additional concerns: off of hctz for about 2 weeks and wants to know if he should start back on it. Was taking 25mg  one half tablet every morning. Stopped taking because he was having urge to urinate. States he still has that issue but it is better.   Results for orders placed or performed in visit on 02/27/20  POCT urinalysis dipstick  Result Value Ref Range   Color, UA     Clarity, UA     Glucose, UA     Bilirubin, UA +++    Ketones, UA     Spec Grav, UA 1.020 1.010 - 1.025   Blood, UA     pH, UA 5.0 5.0 - 8.0   Protein, UA     Urobilinogen, UA     Nitrite, UA     Leukocytes, UA     Appearance     Odor     Patient notes frequent urination.  Also odor to urine.  Also occasional penile discharge.  Also hesitancy and substantial increased frequency at night in recent weeks  Review of Systems No headache, no major weight loss or weight gain, no chest pain no back pain abdominal pain no change in bowel habits complete ROS otherwise negative     Objective:   Physical Exam Vitals reviewed.  Constitutional:      Appearance: He is well-developed.  HENT:     Head: Normocephalic and atraumatic.     Right Ear: External ear normal.     Left  Ear: External ear normal.     Nose: Nose normal.  Eyes:     Pupils: Pupils are equal, round, and reactive to light.  Neck:     Thyroid: No thyromegaly.  Cardiovascular:     Rate and Rhythm: Normal rate and regular rhythm.     Heart sounds: Normal heart sounds. No murmur.  Pulmonary:     Effort: Pulmonary effort is normal. No respiratory distress.     Breath sounds: Normal breath sounds. No wheezing.  Abdominal:     General: Bowel sounds are normal. There is no distension.     Palpations: Abdomen is soft. There is no mass.     Tenderness: There is no abdominal tenderness.  Genitourinary:    Penis: Normal.   Musculoskeletal:        General: Normal range of motion.     Cervical back: Normal range of motion and neck supple.  Lymphadenopathy:     Cervical: No cervical adenopathy.  Skin:    General: Skin is warm and dry.     Findings: No erythema.  Neurological:     Mental Status: He is alert.     Motor: No abnormal muscle tone.  Psychiatric:  Behavior: Behavior normal.        Judgment: Judgment normal.    Prostate exam boggy enlarged tender       Assessment & Plan:  Impression 1 wellness exam.  Diet discussed.  Exercise discussed.  Up-to-date on vaccines.  Up-to-date on colonoscopy.  2.  Subacute prostatitis.  Discussed.  Will initiate appropriate antibiotics.  3.  Hypertension.  Good control discussed to maintain off the hydrochlorothiazide.  Medications refilled.  Diet exercise discussed.  Follow-up in 6 months.

## 2020-02-28 ENCOUNTER — Ambulatory Visit: Payer: Medicare Other | Attending: Internal Medicine

## 2020-02-28 DIAGNOSIS — Z23 Encounter for immunization: Secondary | ICD-10-CM

## 2020-02-28 NOTE — Progress Notes (Signed)
   Covid-[facility] Vaccination [facility]  Name:  Tommy Conley    MRN: [id number] DOB: 07/05/1951  02/28/2020  Tommy Conley was observed post Covid-19 immunization for 15 minutes without incident. He was provided with Vaccine Information Sheet and instruction to access the V-Safe system.   Tommy Conley was instructed to call 911 with any severe reactions post vaccine: [name] Difficulty breathing  . Swelling of face and throat  . A fast heartbeat  . A bad rash all over body  . Dizziness and weakness   Immunizations Administered    Name Date Dose VIS Date Route   Moderna COVID-19 Vaccine 02/28/2020  2:14 PM 0.5 mL 11/21/2019 Intramuscular   Manufacturer: Moderna   Lot: [id number]   [id number]

## 2020-03-14 ENCOUNTER — Inpatient Hospital Stay: Payer: Medicare Other | Attending: Nurse Practitioner

## 2020-03-14 ENCOUNTER — Telehealth: Payer: Self-pay

## 2020-03-14 DIAGNOSIS — C7A021 Malignant carcinoid tumor of the cecum: Secondary | ICD-10-CM | POA: Insufficient documentation

## 2020-03-14 DIAGNOSIS — Z79899 Other long term (current) drug therapy: Secondary | ICD-10-CM | POA: Insufficient documentation

## 2020-03-14 NOTE — Telephone Encounter (Signed)
Call placed to pt regarding missed sandostatin injection appointment

## 2020-03-16 ENCOUNTER — Encounter (HOSPITAL_COMMUNITY): Payer: Self-pay | Admitting: Emergency Medicine

## 2020-03-16 ENCOUNTER — Emergency Department (HOSPITAL_COMMUNITY)
Admission: EM | Admit: 2020-03-16 | Discharge: 2020-03-16 | Disposition: A | Discharge status: Home / Self Care | Payer: Medicare Other | Attending: Emergency Medicine | Admitting: Emergency Medicine

## 2020-03-16 ENCOUNTER — Other Ambulatory Visit: Payer: Self-pay

## 2020-03-16 DIAGNOSIS — Z79899 Other long term (current) drug therapy: Secondary | ICD-10-CM | POA: Insufficient documentation

## 2020-03-16 DIAGNOSIS — I10 Essential (primary) hypertension: Secondary | ICD-10-CM | POA: Diagnosis not present

## 2020-03-16 DIAGNOSIS — F17210 Nicotine dependence, cigarettes, uncomplicated: Secondary | ICD-10-CM | POA: Diagnosis not present

## 2020-03-16 DIAGNOSIS — R358 Other polyuria: Secondary | ICD-10-CM | POA: Diagnosis present

## 2020-03-16 DIAGNOSIS — N410 Acute prostatitis: Secondary | ICD-10-CM | POA: Insufficient documentation

## 2020-03-16 DIAGNOSIS — R339 Retention of urine, unspecified: Secondary | ICD-10-CM | POA: Insufficient documentation

## 2020-03-16 LAB — URINALYSIS, ROUTINE W REFLEX MICROSCOPIC
Bilirubin Urine: NEGATIVE
Glucose, UA: NEGATIVE mg/dL
Hgb urine dipstick: NEGATIVE
Ketones, ur: NEGATIVE mg/dL
Leukocytes,Ua: NEGATIVE
Nitrite: NEGATIVE
Protein, ur: NEGATIVE mg/dL
Specific Gravity, Urine: 1.012 (ref 1.005–1.030)
pH: 5 (ref 5.0–8.0)

## 2020-03-16 NOTE — ED Triage Notes (Signed)
Pt C/O urinary urgency that started 1-2 weeks ago. Pt saw PCP and was prescribed an antibiotic because "my Dr. Rockey Situ me my prostate felt spongy."

## 2020-03-16 NOTE — ED Notes (Signed)
Out of bed to bathroom 

## 2020-03-16 NOTE — ED Notes (Signed)
Pt with recent ? Prostatitis   Seen by PCP   Given meds   Here for urinary frequency

## 2020-03-18 LAB — URINE CULTURE: Culture: NO GROWTH

## 2020-03-20 ENCOUNTER — Inpatient Hospital Stay: Payer: Medicare Other

## 2020-03-20 ENCOUNTER — Other Ambulatory Visit: Payer: Self-pay

## 2020-03-20 VITALS — BP 111/82 | HR 99 | Temp 98.7°F | Resp 18

## 2020-03-20 DIAGNOSIS — C7A021 Malignant carcinoid tumor of the cecum: Secondary | ICD-10-CM | POA: Diagnosis not present

## 2020-03-20 DIAGNOSIS — Z79899 Other long term (current) drug therapy: Secondary | ICD-10-CM | POA: Diagnosis not present

## 2020-03-20 MED ORDER — OCTREOTIDE ACETATE 30 MG IM KIT
PACK | INTRAMUSCULAR | Status: AC
  Filled 2020-03-20: qty 1

## 2020-03-20 MED ORDER — OCTREOTIDE ACETATE 30 MG IM KIT
30.0000 mg | PACK | Freq: Once | INTRAMUSCULAR | Status: AC
  Administered 2020-03-20: 30 mg via INTRAMUSCULAR

## 2020-03-20 NOTE — ED Provider Notes (Signed)
[facility] EMERGENCY DEPARTMENT Provider Note   CSN: [id number] Arrival date & time: 03/16/20  1922     History Chief Complaint  Patient presents with  . Polyuria    Tommy Conley is a 69 y.o. male.  HPI   69 year old male with urinary frequency.  For the last several days he has been having to use the bathroom very frequently.  Only able to void small amounts though.  Recently seen by his PCP and prescribed antibiotics which she has almost finished after 3 weeks.  He is not sure what he is taking though.  He does not feel like symptoms have improved at all.  No acute pain.  No hematuria.  Past Medical History:  Diagnosis Date  . Allergy   . Barrett's esophagus   . Carcinoid tumor of cecum   . Colon polyp   . GERD (gastroesophageal reflux disease)   . Heart murmur   . Hypertension   . IBS (irritable bowel syndrome)   . Malignant carcinoid tumors of other sites ([place]) 08/25/2016  . Perennial allergic rhinitis     Patient Active Problem List   Diagnosis Date Noted  . Malignant carcinoid tumors of other sites ([place]) 08/25/2016  . Rectal pain 05/05/2016  . Irritable bowel syndrome 04/29/2015  . Essential hypertension, benign 04/24/2013  . Erectile dysfunction 04/24/2013  . Esophageal reflux 04/24/2013  . Malignant carcinoid tumor of cecum ([place]) 10/13/2011    Past Surgical History:  Procedure Laterality Date  . colectomy  09/29/11   lap - right with liver bx  . COLON SURGERY    . COLONOSCOPY  07/2011  . UPPER GASTROINTESTINAL ENDOSCOPY     hx barrett's esophagus  . WEDGE LIVER BIOPSY  09/29/11   right liver lesion       Family History  Problem Relation Age of Onset  . Hypertension Father   . Hyperlipidemia Father   . Heart attack Father   . Colon polyps Neg Hx   . Colon cancer Neg Hx   . Stomach cancer Neg Hx   . Cancer Neg Hx   . Esophageal cancer Neg Hx   . Pancreatic cancer Neg Hx   . Rectal cancer Neg Hx     Social History   Tobacco Use  .  Smoking status: Light Tobacco Smoker    Packs/day: 0.25    Years: 17.00    Pack years: 4.25    Types: Cigarettes  . Smokeless tobacco: Never Used  . Tobacco comment: does not smoke every day  Substance Use Topics  . Alcohol use: No  . Drug use: No    Home Medications Prior to Admission medications   Medication Sig Start Date End Date Taking? Authorizing Provider  amLODipine (NORVASC) 10 MG tablet TAKE 1 TABLET(10 MG) BY MOUTH DAILY 02/27/20   Mikey Kirschner, MD  ciprofloxacin (CIPRO) 500 MG tablet Take 1 tablet (500 mg total) by mouth 2 (two) times daily. 02/27/20   Mikey Kirschner, MD  dicyclomine (BENTYL) 20 MG tablet TAKE 1 TABLET BY MOUTH THREE TIMES DAILY AS NEEDED FOR ABDOMINAL CRAMPS 02/15/20   Mikey Kirschner, MD  hydrALAZINE (APRESOLINE) 50 MG tablet Take 1 tablet (50 mg total) by mouth 3 (three) times daily. 02/27/20   Mikey Kirschner, MD  losartan (COZAAR) 100 MG tablet TAKE 1 TABLET(100 MG) BY MOUTH AT BEDTIME 02/27/20   Mikey Kirschner, MD  metoprolol succinate (TOPROL-XL) 50 MG 24 hr tablet TAKE 1 TABLET BY MOUTH EVERY  DAY WITH OR IMMEDIATELY FOLLOWING A MEAL 02/27/20   Mikey Kirschner, MD  ondansetron (ZOFRAN) 8 MG tablet Take 1 tablet (8 mg total) by mouth 2 (two) times daily as needed for nausea or vomiting. 07/20/18   Gus Height, MD  potassium chloride SA (KLOR-CON) 20 MEQ tablet TAKE 1 TABLET(20 MEQ) BY MOUTH TWICE DAILY 11/27/19   Ladell Pier, MD  pramoxine-hydrocortisone (PROCTOCREAM-HC) 1-1 % rectal cream Place 1 application rectally 3 (three) times daily. 04/29/16   Mikey Kirschner, MD  prochlorperazine (COMPAZINE) 5 MG tablet Take 1 tablet (5 mg total) by mouth every 6 (six) hours as needed for nausea or vomiting. 04/28/18   Owens Shark, NP  PROCTOSOL HC 2.5 % rectal cream PLACE 1 APPLICATION RECTALLY 3 (THREE) TIMES DAILY. 11/10/17   Mikey Kirschner, MD  sildenafil (VIAGRA) 100 MG tablet Take 1 tablet (100 mg total) by mouth daily as needed for erectile  dysfunction. 09/19/14   Mikey Kirschner, MD  tamsulosin (FLOMAX) 0.4 MG CAPS capsule TAKE 1 CAPSULE(0.4 MG) BY MOUTH AT BEDTIME 02/14/20   Mikey Kirschner, MD    Allergies    Vasotec [enalapril]  Review of Systems   Review of Systems All systems reviewed and negative, other than as noted in HPI. Physical Exam Updated Vital Signs BP (!) 153/102 (BP Location: Left Arm)   Pulse 91   Temp 98.4 F (36.9 C) (Oral)   Resp 16   Ht 6\' 1"  (1.854 m)   Wt 97.5 kg   SpO2 100%   BMI 28.37 kg/m   Physical Exam Vitals and nursing note reviewed.  Constitutional:      General: He is not in acute distress.    Appearance: He is well-developed.  HENT:     Head: Normocephalic and atraumatic.  Eyes:     General:        Right eye: No discharge.        Left eye: No discharge.     Conjunctiva/sclera: Conjunctivae normal.  Cardiovascular:     Rate and Rhythm: Normal rate and regular rhythm.     Heart sounds: Normal heart sounds. No murmur. No friction rub. No gallop.   Pulmonary:     Effort: Pulmonary effort is normal. No respiratory distress.     Breath sounds: Normal breath sounds.  Abdominal:     General: There is no distension.     Palpations: Abdomen is soft.     Tenderness: There is abdominal tenderness.     Comments: Mild suprapubic tenderness without rebound or guarding.  Musculoskeletal:        General: No tenderness.     Cervical back: Neck supple.  Skin:    General: Skin is warm and dry.  Neurological:     Mental Status: He is alert.  Psychiatric:        Behavior: Behavior normal.        Thought Content: Thought content normal.     ED Results / Procedures / Treatments   Labs (all labs ordered are listed, but only abnormal results are displayed) Labs Reviewed  URINE CULTURE  URINALYSIS, ROUTINE W REFLEX MICROSCOPIC    EKG None  Radiology No results found.  Procedures Procedures (including critical care time)  Medications Ordered in ED Medications - No  data to display  ED Course  I have reviewed the triage vital signs and the nursing notes.  Pertinent labs & imaging results that were available during my care of the patient  were reviewed by me and considered in my medical decision making (see chart for details).    MDM Rules/Calculators/A&P                      69 year old male with urinary retention.  Foley catheter was placed.  Currently being treated for prostatitis.  Urology follow-up.  Final Clinical Impression(s) / ED Diagnoses Final diagnoses:  Urinary retention    Rx / DC Orders ED Discharge Orders    None       Virgel Manifold, MD 03/20/20 1807

## 2020-03-20 NOTE — Patient Instructions (Signed)
Octreotide injection solution What is this medicine? OCTREOTIDE (ok TREE oh tide) is used to reduce blood levels of growth hormone in patients with a condition called acromegaly. This medicine also reduces flushing and watery diarrhea caused by certain types of cancer. This medicine may be used for other purposes; ask your health care provider or pharmacist if you have questions. COMMON BRAND NAME(S): Bynfezia, Sandostatin What should I tell my health care provider before I take this medicine? They need to know if you have any of these conditions:  diabetes  gallbladder disease  kidney disease  liver disease  thyroid disease  an unusual or allergic reaction to octreotide, other medicines, foods, dyes, or preservatives  pregnant or trying to get pregnant  breast-feeding How should I use this medicine? This medicine is for injection under the skin or into a vein (only in emergency situations). It is usually given by a health care professional in a hospital or clinic setting. If you get this medicine at home, you will be taught how to prepare and give this medicine. Allow the injection solution to come to room temperature before use. Do not warm it artificially. Use exactly as directed. Take your medicine at regular intervals. Do not take your medicine more often than directed. It is important that you put your used needles and syringes in a special sharps container. Do not put them in a trash can. If you do not have a sharps container, call your pharmacist or healthcare provider to get one. Talk to your pediatrician regarding the use of this medicine in children. Special care may be needed. Overdosage: If you think you have taken too much of this medicine contact a poison control center or emergency room at once. NOTE: This medicine is only for you. Do not share this medicine with others. What if I miss a dose? If you miss a dose, take it as soon as you can. If it is almost time for your  next dose, take only that dose. Do not take double or extra doses. What may interact with this medicine?  bromocriptine  certain medicines for blood pressure, heart disease, irregular heartbeat  cyclosporine  diuretics  medicines for diabetes, including insulin  quinidine This list may not describe all possible interactions. Give your health care provider a list of all the medicines, herbs, non-prescription drugs, or dietary supplements you use. Also tell them if you smoke, drink alcohol, or use illegal drugs. Some items may interact with your medicine. What should I watch for while using this medicine? Visit your doctor or health care professional for regular checks on your progress. To help reduce irritation at the injection site, use a different site for each injection and make sure the solution is at room temperature before use. This medicine may cause decreases in blood sugar. Signs of low blood sugar include chills, cool, pale skin or cold sweats, drowsiness, extreme hunger, fast heartbeat, headache, nausea, nervousness or anxiety, shakiness, trembling, unsteadiness, tiredness, or weakness. Contact your doctor or health care professional right away if you experience any of these symptoms. This medicine may increase blood sugar. Ask your healthcare provider if changes in diet or medicines are needed if you have diabetes. This medicine may cause a decrease in vitamin B12. You should make sure that you get enough vitamin B12 while you are taking this medicine. Discuss the foods you eat and the vitamins you take with your health care professional. What side effects may I notice from receiving this medicine? Side   effects that you should report to your doctor or health care professional as soon as possible:  allergic reactions like skin rash, itching or hives, swelling of the face, lips, or tongue  fast, slow, or irregular heartbeat  right upper belly pain  severe stomach pain  signs  and symptoms of high blood sugar such as being more thirsty or hungry or having to urinate more than normal. You may also feel very tired or have blurry vision.  signs and symptoms of low blood sugar such as feeling anxious; confusion; dizziness; increased hunger; unusually weak or tired; increased sweating; shakiness; cold, clammy skin; irritable; headache; blurred vision; fast heartbeat; loss of consciousness  unusually weak or tired Side effects that usually do not require medical attention (report to your doctor or health care professional if they continue or are bothersome):  diarrhea  dizziness  gas  headache  nausea, vomiting  pain, redness, or irritation at site where injected  upset stomach This list may not describe all possible side effects. Call your doctor for medical advice about side effects. You may report side effects to FDA at 1-800-FDA-1088. Where should I keep my medicine? Keep out of the reach of children. Store in a refrigerator between 2 and 8 degrees C (36 and 46 degrees F). Protect from light. Allow to come to room temperature naturally. Do not use artificial heat. If protected from light, the injection may be stored at room temperature between 20 and 30 degrees C (70 and 86 degrees F) for 14 days. After the initial use, throw away any unused portion of a multiple dose vial after 14 days. Throw away unused portions of the ampules after use. NOTE: This sheet is a summary. It may not cover all possible information. If you have questions about this medicine, talk to your doctor, pharmacist, or health care provider.  2020 Elsevier/Gold Standard (2019-07-06 13:33:09)  

## 2020-03-24 ENCOUNTER — Emergency Department (HOSPITAL_COMMUNITY)
Admission: EM | Admit: 2020-03-24 | Discharge: 2020-03-24 | Disposition: A | Discharge status: Home / Self Care | Payer: Medicare Other | Attending: Emergency Medicine | Admitting: Emergency Medicine

## 2020-03-24 ENCOUNTER — Encounter (HOSPITAL_COMMUNITY): Payer: Self-pay | Admitting: Emergency Medicine

## 2020-03-24 ENCOUNTER — Other Ambulatory Visit: Payer: Self-pay

## 2020-03-24 DIAGNOSIS — Z85038 Personal history of other malignant neoplasm of large intestine: Secondary | ICD-10-CM | POA: Diagnosis not present

## 2020-03-24 DIAGNOSIS — T83091A Other mechanical complication of indwelling urethral catheter, initial encounter: Secondary | ICD-10-CM

## 2020-03-24 DIAGNOSIS — Z79899 Other long term (current) drug therapy: Secondary | ICD-10-CM | POA: Diagnosis not present

## 2020-03-24 DIAGNOSIS — T83031A Leakage of indwelling urethral catheter, initial encounter: Secondary | ICD-10-CM | POA: Diagnosis not present

## 2020-03-24 DIAGNOSIS — I10 Essential (primary) hypertension: Secondary | ICD-10-CM | POA: Insufficient documentation

## 2020-03-24 DIAGNOSIS — Y733 Surgical instruments, materials and gastroenterology and urology devices (including sutures) associated with adverse incidents: Secondary | ICD-10-CM | POA: Insufficient documentation

## 2020-03-24 DIAGNOSIS — Z720 Tobacco use: Secondary | ICD-10-CM | POA: Insufficient documentation

## 2020-03-24 DIAGNOSIS — T83098A Other mechanical complication of other indwelling urethral catheter, initial encounter: Secondary | ICD-10-CM | POA: Diagnosis not present

## 2020-03-24 LAB — URINALYSIS, ROUTINE W REFLEX MICROSCOPIC
Bacteria, UA: NONE SEEN
Bilirubin Urine: NEGATIVE
Glucose, UA: NEGATIVE mg/dL
Ketones, ur: NEGATIVE mg/dL
Nitrite: NEGATIVE
Protein, ur: 100 mg/dL — AB
RBC / HPF: >50 RBC/hpf — ABNORMAL HIGH (ref 0–5)
Specific Gravity, Urine: 1.017 (ref 1.005–1.030)
pH: 6 (ref 5.0–8.0)

## 2020-03-24 NOTE — ED Notes (Signed)
Pt here last Saturday for same   Foley placed   Now with leaking from penis when he has the urge to urinate and "getting Worse" in terms of leaking per pt   Pt has Ca

## 2020-03-24 NOTE — ED Provider Notes (Signed)
[facility] EMERGENCY DEPARTMENT Provider Note   CSN: [id number] Arrival date & time: 03/24/20  1546     History Chief Complaint  Patient presents with  . Catheter leaking    Tommy Conley is a 69 y.o. male with a history as outlined below, most significant for carcinoid tumor of his cecum with metastasis, also currently being treated for acute prostatitis, currently on day 10 of a 10-day course of Cipro, presenting for leakage of urine from around his Foley catheter site.  He was seen here on March 27th  for urinary retention and had a Foley catheter placed at that time.  He has had no problems since then until urine started leaking from around the tubing earlier today.  He also endorses increased urgency and increasing amounts of leaking urine.  He has had no fevers or chills, denies suprapubic pain or distention.  He is scheduled to establish care with a urologist in 2 days.  The history is provided by the patient.       Past Medical History:  Diagnosis Date  . Allergy   . Barrett's esophagus   . Carcinoid tumor of cecum   . Colon polyp   . GERD (gastroesophageal reflux disease)   . Heart murmur   . Hypertension   . IBS (irritable bowel syndrome)   . Malignant carcinoid tumors of other sites ([place]) 08/25/2016  . Perennial allergic rhinitis     Patient Active Problem List   Diagnosis Date Noted  . Malignant carcinoid tumors of other sites ([place]) 08/25/2016  . Rectal pain 05/05/2016  . Irritable bowel syndrome 04/29/2015  . Essential hypertension, benign 04/24/2013  . Erectile dysfunction 04/24/2013  . Esophageal reflux 04/24/2013  . Malignant carcinoid tumor of cecum ([place]) 10/13/2011    Past Surgical History:  Procedure Laterality Date  . colectomy  09/29/11   lap - right with liver bx  . COLON SURGERY    . COLONOSCOPY  07/2011  . UPPER GASTROINTESTINAL ENDOSCOPY     hx barrett's esophagus  . WEDGE LIVER BIOPSY  09/29/11   right liver lesion       Family  History  Problem Relation Age of Onset  . Hypertension Father   . Hyperlipidemia Father   . Heart attack Father   . Colon polyps Neg Hx   . Colon cancer Neg Hx   . Stomach cancer Neg Hx   . Cancer Neg Hx   . Esophageal cancer Neg Hx   . Pancreatic cancer Neg Hx   . Rectal cancer Neg Hx     Social History   Tobacco Use  . Smoking status: Light Tobacco Smoker    Packs/day: 0.25    Years: 17.00    Pack years: 4.25    Types: Cigarettes  . Smokeless tobacco: Never Used  . Tobacco comment: does not smoke every day  Substance Use Topics  . Alcohol use: No  . Drug use: No    Home Medications Prior to Admission medications   Medication Sig Start Date End Date Taking? Authorizing Provider  amLODipine (NORVASC) 10 MG tablet TAKE 1 TABLET(10 MG) BY MOUTH DAILY 02/27/20   Mikey Kirschner, MD  ciprofloxacin (CIPRO) 500 MG tablet Take 1 tablet (500 mg total) by mouth 2 (two) times daily. 02/27/20   Mikey Kirschner, MD  dicyclomine (BENTYL) 20 MG tablet TAKE 1 TABLET BY MOUTH THREE TIMES DAILY AS NEEDED FOR ABDOMINAL CRAMPS 02/15/20   Mikey Kirschner, MD  hydrALAZINE (APRESOLINE) 50  MG tablet Take 1 tablet (50 mg total) by mouth 3 (three) times daily. 02/27/20   Mikey Kirschner, MD  losartan (COZAAR) 100 MG tablet TAKE 1 TABLET(100 MG) BY MOUTH AT BEDTIME 02/27/20   Mikey Kirschner, MD  metoprolol succinate (TOPROL-XL) 50 MG 24 hr tablet TAKE 1 TABLET BY MOUTH EVERY DAY WITH OR IMMEDIATELY FOLLOWING A MEAL 02/27/20   Mikey Kirschner, MD  ondansetron (ZOFRAN) 8 MG tablet Take 1 tablet (8 mg total) by mouth 2 (two) times daily as needed for nausea or vomiting. 07/20/18   Gus Height, MD  potassium chloride SA (KLOR-CON) 20 MEQ tablet TAKE 1 TABLET(20 MEQ) BY MOUTH TWICE DAILY 11/27/19   Ladell Pier, MD  pramoxine-hydrocortisone (PROCTOCREAM-HC) 1-1 % rectal cream Place 1 application rectally 3 (three) times daily. 04/29/16   Mikey Kirschner, MD  prochlorperazine (COMPAZINE) 5 MG tablet  Take 1 tablet (5 mg total) by mouth every 6 (six) hours as needed for nausea or vomiting. 04/28/18   Owens Shark, NP  PROCTOSOL HC 2.5 % rectal cream PLACE 1 APPLICATION RECTALLY 3 (THREE) TIMES DAILY. 11/10/17   Mikey Kirschner, MD  sildenafil (VIAGRA) 100 MG tablet Take 1 tablet (100 mg total) by mouth daily as needed for erectile dysfunction. 09/19/14   Mikey Kirschner, MD  tamsulosin (FLOMAX) 0.4 MG CAPS capsule TAKE 1 CAPSULE(0.4 MG) BY MOUTH AT BEDTIME 02/14/20   Mikey Kirschner, MD    Allergies    Vasotec [enalapril]  Review of Systems   Review of Systems  Constitutional: Negative for chills and fever.  HENT: Negative for congestion and sore throat.   Eyes: Negative.   Respiratory: Negative for chest tightness and shortness of breath.   Cardiovascular: Negative for chest pain.  Gastrointestinal: Negative for abdominal pain, nausea and vomiting.  Genitourinary: Positive for frequency. Negative for penile pain and urgency.       Negative except as mentioned in HPI.   Musculoskeletal: Negative for arthralgias, joint swelling and neck pain.  Skin: Negative.  Negative for rash and wound.  Neurological: Negative for dizziness, weakness, light-headedness, numbness and headaches.  Psychiatric/Behavioral: Negative.     Physical Exam Updated Vital Signs BP 114/82 (BP Location: Right Arm)   Pulse (!) 102   Temp 97.8 F (36.6 C) (Oral)   Resp 18   Ht 6\' 1"  (1.854 m)   Wt 97 kg   SpO2 97%   BMI 28.21 kg/m   Physical Exam Vitals and nursing note reviewed.  Constitutional:      Appearance: He is well-developed.  HENT:     Head: Normocephalic and atraumatic.  Eyes:     Conjunctiva/sclera: Conjunctivae normal.  Cardiovascular:     Rate and Rhythm: Normal rate and regular rhythm.     Heart sounds: Normal heart sounds.  Pulmonary:     Effort: Pulmonary effort is normal.     Breath sounds: Normal breath sounds. No wheezing.  Abdominal:     General: Bowel sounds are  normal. There is no distension.     Palpations: Abdomen is soft.     Tenderness: There is no abdominal tenderness. There is no guarding.  Musculoskeletal:        General: Normal range of motion.     Cervical back: Normal range of motion.  Skin:    General: Skin is warm and dry.  Neurological:     Mental Status: He is alert.     ED Results / Procedures / Treatments  Labs (all labs ordered are listed, but only abnormal results are displayed) Labs Reviewed  URINE CULTURE  URINALYSIS, ROUTINE W REFLEX MICROSCOPIC    EKG None  Radiology No results found.  Procedures Procedures (including critical care time)  Medications Ordered in ED Medications - No data to display  ED Course  I have reviewed the triage vital signs and the nursing notes.  Pertinent labs & imaging results that were available during my care of the patient were reviewed by me and considered in my medical decision making (see chart for details).    MDM Rules/Calculators/A&P                      Patient had a 80 French Foley catheter coming in.  We replaced that with a 16 [place], suspect the larger cells will be less likely to become obstructed.  He was observed in the department, had no further leaking from around the tubing.  There was a darker, somewhat cloudy urine in the catheter bag.  He is currently on Cipro, but a UA and culture was ordered which may be of help to his urologist who we will see in 2 days.  As needed follow-up anticipated.  Patient left department without any complaints. Final Clinical Impression(s) / ED Diagnoses Final diagnoses:  Complication, blocked Foley catheter, initial encounter [facility])    Rx / [place] Orders ED Discharge Orders    None       Landis Martins 03/24/20 1808    Hayden Rasmussen, MD 03/25/20 [phone]

## 2020-03-24 NOTE — Discharge Instructions (Addendum)
Follow up with your urologist on Tuesday as planned.

## 2020-03-24 NOTE — ED Notes (Signed)
Pt reports he has an appt on Tues with Urology referral

## 2020-03-24 NOTE — ED Notes (Signed)
#  59 F catheter removed with removal of 8cc from balloon  Replaced with a #16 F catheter and 10cc to fill balloon  Sterile techniques observed with Jonelle Sidle, RN assist

## 2020-03-24 NOTE — ED Triage Notes (Signed)
Leaking around foley catheter , discharge and having increased urge to urinate.

## 2020-03-26 LAB — URINE CULTURE: Culture: NO GROWTH

## 2020-03-27 DIAGNOSIS — N472 Paraphimosis: Secondary | ICD-10-CM | POA: Diagnosis not present

## 2020-03-27 DIAGNOSIS — R338 Other retention of urine: Secondary | ICD-10-CM | POA: Diagnosis not present

## 2020-04-05 ENCOUNTER — Other Ambulatory Visit: Payer: Self-pay

## 2020-04-05 ENCOUNTER — Ambulatory Visit (HOSPITAL_COMMUNITY)
Admission: RE | Admit: 2020-04-05 | Discharge: 2020-04-05 | Disposition: A | Discharge status: Home / Self Care | Payer: Medicare Other | Source: Ambulatory Visit | Attending: Nurse Practitioner | Admitting: Nurse Practitioner

## 2020-04-05 DIAGNOSIS — C7A021 Malignant carcinoid tumor of the cecum: Secondary | ICD-10-CM | POA: Diagnosis not present

## 2020-04-05 DIAGNOSIS — C7951 Secondary malignant neoplasm of bone: Secondary | ICD-10-CM | POA: Diagnosis not present

## 2020-04-05 MED ORDER — GALLIUM GA 68 DOTATATE IV KIT: 5.4000 | PACK | Freq: Once | INTRAVENOUS | Status: DC | PRN

## 2020-04-08 ENCOUNTER — Emergency Department (HOSPITAL_COMMUNITY)
Admission: EM | Admit: 2020-04-08 | Discharge: 2020-04-08 | Disposition: A | Discharge status: Home / Self Care | Payer: Medicare Other | Attending: Emergency Medicine | Admitting: Emergency Medicine

## 2020-04-08 ENCOUNTER — Encounter (HOSPITAL_COMMUNITY): Payer: Self-pay | Admitting: *Deleted

## 2020-04-08 ENCOUNTER — Other Ambulatory Visit: Payer: Self-pay

## 2020-04-08 DIAGNOSIS — I10 Essential (primary) hypertension: Secondary | ICD-10-CM | POA: Diagnosis not present

## 2020-04-08 DIAGNOSIS — R339 Retention of urine, unspecified: Secondary | ICD-10-CM | POA: Insufficient documentation

## 2020-04-08 DIAGNOSIS — F17210 Nicotine dependence, cigarettes, uncomplicated: Secondary | ICD-10-CM | POA: Insufficient documentation

## 2020-04-08 DIAGNOSIS — Z85030 Personal history of malignant carcinoid tumor of large intestine: Secondary | ICD-10-CM | POA: Insufficient documentation

## 2020-04-08 DIAGNOSIS — Z79899 Other long term (current) drug therapy: Secondary | ICD-10-CM | POA: Insufficient documentation

## 2020-04-08 LAB — BASIC METABOLIC PANEL
Anion gap: 9 (ref 5–15)
BUN: 13 mg/dL (ref 8–23)
CO2: 22 mmol/L (ref 22–32)
Calcium: 8.6 mg/dL — ABNORMAL LOW (ref 8.9–10.3)
Chloride: 108 mmol/L (ref 98–111)
Creatinine, Ser: 0.94 mg/dL (ref 0.61–1.24)
GFR calc Af Amer: >60 mL/min (ref >60)
GFR calc non Af Amer: >60 mL/min (ref >60)
Glucose, Bld: 116 mg/dL — ABNORMAL HIGH (ref 70–99)
Potassium: 3.3 mmol/L — ABNORMAL LOW (ref 3.5–5.1)
Sodium: 139 mmol/L (ref 135–145)

## 2020-04-08 LAB — URINALYSIS, ROUTINE W REFLEX MICROSCOPIC
Bilirubin Urine: NEGATIVE
Glucose, UA: NEGATIVE mg/dL
Hgb urine dipstick: NEGATIVE
Ketones, ur: NEGATIVE mg/dL
Leukocytes,Ua: NEGATIVE
Nitrite: NEGATIVE
Protein, ur: NEGATIVE mg/dL
Specific Gravity, Urine: 1.012 (ref 1.005–1.030)
pH: 5 (ref 5.0–8.0)

## 2020-04-08 NOTE — ED Provider Notes (Signed)
[facility] Provider Note   CSN: [id number] Arrival date & time: 04/08/20  1436     History Chief Complaint  Patient presents with  . Urinary Retention    Tommy Conley is a 69 y.o. male.  HPI   This patient is a 69 year old male, he has a known history of what was thought to be prostatitis based on notes from the patient's clinic visits on March 3 and February 27, 2020.  The patient reports that he was started on an antibiotic, review of the medical record shows that the patient has been taking Flomax, he has also been taking an antibiotic ciprofloxacin.  He cannot recall the names of these offhand to me.  The patient states that he was recently seen and had a Foley catheter placed when he was in the emergency department.  That was on March 27.  He had another visit on April 4, for leakage of urine around the Foley catheter.  He then followed up with the urologist to remove the Foley catheter.  The patient states that for several days he was able to urinate just fine however over the last couple of days he has had progressive difficulty with urinary outlet obstruction.  He states that when he has a bowel movement he can often urinate spontaneously but when he tries to urinate by himself he cannot hardly get anything out.  This is causing some discomfort in his lower pelvis.  He denies fevers chills nausea vomiting, there is no flank pain, he feels like he is back in the same place that he was when he had the Foley catheter placed to begin with a month ago.  Past Medical History:  Diagnosis Date  . Allergy   . Barrett's esophagus   . Carcinoid tumor of cecum   . Colon polyp   . GERD (gastroesophageal reflux disease)   . Heart murmur   . Hypertension   . IBS (irritable bowel syndrome)   . Malignant carcinoid tumors of other sites ([place]) 08/25/2016  . Perennial allergic rhinitis     Patient Active Problem List   Diagnosis Date Noted  . Malignant carcinoid tumors  of other sites ([place]) 08/25/2016  . Rectal pain 05/05/2016  . Irritable bowel syndrome 04/29/2015  . Essential hypertension, benign 04/24/2013  . Erectile dysfunction 04/24/2013  . Esophageal reflux 04/24/2013  . Malignant carcinoid tumor of cecum ([place]) 10/13/2011    Past Surgical History:  Procedure Laterality Date  . colectomy  09/29/11   lap - right with liver bx  . COLON SURGERY    . COLONOSCOPY  07/2011  . UPPER GASTROINTESTINAL ENDOSCOPY     hx barrett's esophagus  . WEDGE LIVER BIOPSY  09/29/11   right liver lesion       Family History  Problem Relation Age of Onset  . Hypertension Father   . Hyperlipidemia Father   . Heart attack Father   . Colon polyps Neg Hx   . Colon cancer Neg Hx   . Stomach cancer Neg Hx   . Cancer Neg Hx   . Esophageal cancer Neg Hx   . Pancreatic cancer Neg Hx   . Rectal cancer Neg Hx     Social History   Tobacco Use  . Smoking status: Light Tobacco Smoker    Packs/day: 0.25    Years: 17.00    Pack years: 4.25    Types: Cigarettes  . Smokeless tobacco: Never Used  . Tobacco comment: does not smoke  every day  Substance Use Topics  . Alcohol use: No  . Drug use: No    Home Medications Prior to Admission medications   Medication Sig Start Date End Date Taking? Authorizing Provider  amLODipine (NORVASC) 10 MG tablet TAKE 1 TABLET(10 MG) BY MOUTH DAILY 02/27/20   Mikey Kirschner, MD  dicyclomine (BENTYL) 20 MG tablet TAKE 1 TABLET BY MOUTH THREE TIMES DAILY AS NEEDED FOR ABDOMINAL CRAMPS 02/15/20   Mikey Kirschner, MD  hydrALAZINE (APRESOLINE) 50 MG tablet Take 1 tablet (50 mg total) by mouth 3 (three) times daily. 02/27/20   Mikey Kirschner, MD  losartan (COZAAR) 100 MG tablet TAKE 1 TABLET(100 MG) BY MOUTH AT BEDTIME 02/27/20   Mikey Kirschner, MD  metoprolol succinate (TOPROL-XL) 50 MG 24 hr tablet TAKE 1 TABLET BY MOUTH EVERY DAY WITH OR IMMEDIATELY FOLLOWING A MEAL 02/27/20   Mikey Kirschner, MD  ondansetron (ZOFRAN) 8 MG tablet  Take 1 tablet (8 mg total) by mouth 2 (two) times daily as needed for nausea or vomiting. 07/20/18   Gus Height, MD  potassium chloride SA (KLOR-CON) 20 MEQ tablet TAKE 1 TABLET(20 MEQ) BY MOUTH TWICE DAILY 11/27/19   Ladell Pier, MD  pramoxine-hydrocortisone (PROCTOCREAM-HC) 1-1 % rectal cream Place 1 application rectally 3 (three) times daily. 04/29/16   Mikey Kirschner, MD  prochlorperazine (COMPAZINE) 5 MG tablet Take 1 tablet (5 mg total) by mouth every 6 (six) hours as needed for nausea or vomiting. 04/28/18   Owens Shark, NP  PROCTOSOL HC 2.5 % rectal cream PLACE 1 APPLICATION RECTALLY 3 (THREE) TIMES DAILY. 11/10/17   Mikey Kirschner, MD  sildenafil (VIAGRA) 100 MG tablet Take 1 tablet (100 mg total) by mouth daily as needed for erectile dysfunction. 09/19/14   Mikey Kirschner, MD  tamsulosin (FLOMAX) 0.4 MG CAPS capsule TAKE 1 CAPSULE(0.4 MG) BY MOUTH AT BEDTIME 02/14/20   Mikey Kirschner, MD    Allergies    Vasotec [enalapril]  Review of Systems   Review of Systems  All other systems reviewed and are negative.   Physical Exam Updated Vital Signs BP 113/80   Pulse 96   Temp 97.8 F (36.6 C)   Resp 20   Ht 1.854 m (6\' 1" )   Wt 95.3 kg   SpO2 100%   BMI 27.71 kg/m   Physical Exam Vitals and nursing note reviewed.  Constitutional:      General: He is not in acute distress.    Appearance: He is well-developed.  HENT:     Head: Normocephalic and atraumatic.     Mouth/Throat:     Pharynx: No oropharyngeal exudate.  Eyes:     General: No scleral icterus.       Right eye: No discharge.        Left eye: No discharge.     Conjunctiva/sclera: Conjunctivae normal.     Pupils: Pupils are equal, round, and reactive to light.  Neck:     Thyroid: No thyromegaly.     Vascular: No JVD.  Cardiovascular:     Rate and Rhythm: Normal rate and regular rhythm.     Heart sounds: Normal heart sounds. No murmur. No friction rub. No gallop.   Pulmonary:     Effort: Pulmonary  effort is normal. No respiratory distress.     Breath sounds: Normal breath sounds. No wheezing or rales.  Abdominal:     General: Bowel sounds are normal. There is no distension.  Palpations: Abdomen is soft. There is no mass.     Tenderness: There is no abdominal tenderness.     Comments: Fullness with some discomfort in the lower abdomen, no focal tenderness masses rebound or guarding  Genitourinary:    Comments: Normal-appearing uncircumcised penis, normal-appearing urethral meatus, no tenderness over the scrotum penis testicles or perineum or inguinal regions Musculoskeletal:        General: No tenderness. Normal range of motion.     Cervical back: Normal range of motion and neck supple.  Lymphadenopathy:     Cervical: No cervical adenopathy.  Skin:    General: Skin is warm and dry.     Findings: No erythema or rash.  Neurological:     Mental Status: He is alert.     Coordination: Coordination normal.  Psychiatric:        Behavior: Behavior normal.     ED Results / Procedures / Treatments   Labs (all labs ordered are listed, but only abnormal results are displayed) Labs Reviewed  BASIC METABOLIC PANEL - Abnormal; Notable for the following components:      Result Value   Potassium 3.3 (*)    Glucose, Bld 116 (*)    Calcium 8.6 (*)    All other components within normal limits  URINE CULTURE  URINALYSIS, ROUTINE W REFLEX MICROSCOPIC    EKG None  Radiology No results found.  Procedures Procedures (including critical care time)  Medications Ordered in ED Medications - No data to display  ED Course  I have reviewed the triage vital signs and the nursing notes.  Pertinent labs & imaging results that were available during my care of the patient were reviewed by me and considered in my medical decision making (see chart for details).    MDM Rules/Calculators/A&P                       This patient presents to the ED for concern of urinary retention and  pelvic discomfort, this involves an extensive number of treatment options, and is a complaint that carries with it a high risk of complications and morbidity.  The differential diagnosis includes prostatitis, prostamegaly, history of possible cancer with metastasis status post surgery, following with oncology   Lab Tests:   I Ordered, reviewed, and interpreted labs, which included urinalysis, metabolic panel  Medicines ordered:   I ordered Foley catheter for urinary retention   Additional history obtained:   Additional history obtained from medical record  Previous records obtained and reviewed   Consultations Obtained:   I consulted with the patient and discussed lab and imaging findings  Reevaluation:  After the interventions stated above, I reevaluated the patient and found improved, urinary bladder decompressed.  Critical Interventions:  . Foley catheter placement  At this time I think the patient would benefit from a bladder scan to make sure he is not retaining, Foley catheter will be replaced if necessary, urinalysis with culture will be sent as well.  He has a nonsurgical abdomen, check renal function to make sure there has not been any dysfunction in that source.  The patient had greater than 400 cc of urine in the bladder.  This was decompressed, the patient did well, no signs of UTI, stable for discharge to follow-up with urology, patient agreeable  Final Clinical Impression(s) / ED Diagnoses Final diagnoses:  Urinary retention    Rx / DC Orders ED Discharge Orders    None  Noemi Chapel, MD 04/08/20 [phone]

## 2020-04-08 NOTE — ED Triage Notes (Signed)
Pt reports small amounts of urine when he does urinate, but has not had any urine output since last pm.

## 2020-04-08 NOTE — Discharge Instructions (Addendum)
Please follow-up again with the urologist, they may need to do further testing to see why you continue to have urinary retention.  Please keep the Foley catheter in place until you follow-up.  You do not have any signs of urinary infection.  Seek a medical exam for severe or worsening pain fever or vomiting.

## 2020-04-09 LAB — URINE CULTURE: Culture: NO GROWTH

## 2020-04-10 ENCOUNTER — Telehealth: Payer: Self-pay | Admitting: Oncology

## 2020-04-10 DIAGNOSIS — R338 Other retention of urine: Secondary | ICD-10-CM | POA: Diagnosis not present

## 2020-04-10 NOTE — Telephone Encounter (Signed)
Scheduled appt per 4/21 sch message - pt is aware of appt

## 2020-04-11 ENCOUNTER — Encounter: Payer: Self-pay | Admitting: Family Medicine

## 2020-04-11 ENCOUNTER — Other Ambulatory Visit: Payer: Self-pay

## 2020-04-11 ENCOUNTER — Ambulatory Visit (INDEPENDENT_AMBULATORY_CARE_PROVIDER_SITE_OTHER): Payer: Medicare Other | Admitting: Family Medicine

## 2020-04-11 VITALS — BP 130/86 | HR 121 | Temp 97.5°F | Wt 210.4 lb

## 2020-04-11 DIAGNOSIS — I10 Essential (primary) hypertension: Secondary | ICD-10-CM

## 2020-04-11 DIAGNOSIS — R42 Dizziness and giddiness: Secondary | ICD-10-CM

## 2020-04-11 NOTE — Patient Instructions (Signed)
Take metoprolol daily.  Check blood pressure daily.  If seeing bp 150/90 or above then call the office. Or if seeing numbers under 100/70, call the office.

## 2020-04-11 NOTE — Progress Notes (Signed)
Patient ID: Tommy Conley, male    DOB: 11-May-1951, 69 y.o.   MRN: [id number]   Chief Complaint  Patient presents with  . discuss blood pressure meds   Subjective:   HPI Tommy Conley stating went to see Urologist yesterday and was feeling dizziness, light headed and weak.  Takes 4 blood pressure medications normally.  Had a bp at that office of 118/80 and they recommended for him to stop his bp meds and see pcp for adjustment. Hasn't had his bp meds for last 2 days.  Today bp at 130/86. No chest pain, dizziness, HA, sob, or palpations.  No other change in his diet or significant weight loss in the past year.  Has foley catheter in place, for treatment of urinary retention.  Has been seeing Urology for this issue and has been to the ED for a few visits relating to this.    Medical History Tommy Conley has a past medical history of Allergy, Barrett's esophagus, Carcinoid tumor of cecum, Colon polyp, GERD (gastroesophageal reflux disease), Heart murmur, Hypertension, IBS (irritable bowel syndrome), Malignant carcinoid tumors of other sites [facility]) (08/25/2016), and Perennial allergic rhinitis.   Outpatient Encounter Medications as of 04/11/2020  Medication Sig  . amLODipine (NORVASC) 10 MG tablet TAKE 1 TABLET(10 MG) BY MOUTH DAILY  . dicyclomine (BENTYL) 20 MG tablet TAKE 1 TABLET BY MOUTH THREE TIMES DAILY AS NEEDED FOR ABDOMINAL CRAMPS  . hydrALAZINE (APRESOLINE) 50 MG tablet Take 1 tablet (50 mg total) by mouth 3 (three) times daily.  [name] losartan (COZAAR) 100 MG tablet TAKE 1 TABLET(100 MG) BY MOUTH AT BEDTIME  . metoprolol succinate (TOPROL-XL) 50 MG 24 hr tablet TAKE 1 TABLET BY MOUTH EVERY DAY WITH OR IMMEDIATELY FOLLOWING A MEAL  . ondansetron (ZOFRAN) 8 MG tablet Take 1 tablet (8 mg total) by mouth 2 (two) times daily as needed for nausea or vomiting.  . potassium chloride SA (KLOR-CON) 20 MEQ tablet TAKE 1 TABLET(20 MEQ) BY MOUTH TWICE DAILY  . pramoxine-hydrocortisone (PROCTOCREAM-HC) 1-1  % rectal cream Place 1 application rectally 3 (three) times daily.  . prochlorperazine (COMPAZINE) 5 MG tablet Take 1 tablet (5 mg total) by mouth every 6 (six) hours as needed for nausea or vomiting.  [name] PROCTOSOL HC 2.5 % rectal cream PLACE 1 APPLICATION RECTALLY 3 (THREE) TIMES DAILY.  . sildenafil (VIAGRA) 100 MG tablet Take 1 tablet (100 mg total) by mouth daily as needed for erectile dysfunction.  . tamsulosin (FLOMAX) 0.4 MG CAPS capsule TAKE 1 CAPSULE(0.4 MG) BY MOUTH AT BEDTIME   No facility-administered encounter medications on file as of 04/11/2020.     Review of Systems   Vitals BP 130/86   Pulse (!) 121   Temp (!) 97.5 F (36.4 C)   Wt 210 lb 6.4 oz (95.4 kg)   SpO2 99%   BMI 27.76 kg/m   Objective:   Physical Exam Constitutional:      General: He is not in acute distress.    Appearance: Normal appearance. He is not ill-appearing or toxic-appearing.  HENT:     Head: Normocephalic.     Nose: Nose normal. No congestion.     Mouth/Throat:     Mouth: Mucous membranes are moist.     Pharynx: No oropharyngeal exudate.  Eyes:     Extraocular Movements: Extraocular movements intact.     Conjunctiva/sclera: Conjunctivae normal.     Pupils: Pupils are equal, round, and reactive to light.  Cardiovascular:     Rate and  Rhythm: Regular rhythm. Tachycardia present.     Pulses: Normal pulses.     Heart sounds: Normal heart sounds. No murmur.  Pulmonary:     Effort: Pulmonary effort is normal.     Breath sounds: Normal breath sounds. No wheezing, rhonchi or rales.  Abdominal:     General: Bowel sounds are normal.     Palpations: Abdomen is soft.     Tenderness: There is no abdominal tenderness.  Musculoskeletal:        General: Normal range of motion.     Right lower leg: No edema.     Left lower leg: No edema.  Skin:    General: Skin is warm and dry.     Findings: No rash.  Neurological:     General: No focal deficit present.     Mental Status: He is alert and  oriented to person, place, and time.     Cranial Nerves: No cranial nerve deficit.     Motor: No weakness.     Gait: Gait normal.  Psychiatric:        Mood and Affect: Mood normal.        Behavior: Behavior normal.      Assessment and Plan   1. Essential hypertension, benign  2. Dizziness   Tommy Conley blood pressure today suboptimal, and HR elevated at 121bpm.  Advising Tommy Conley needing to take metoprolol daily. Not having any symptoms of chest pain, sob, dizziness, or palpitations. Wrote down instructions.  Take metoprolol 50mg  XL daily. Hold off on other blood pressure medications (amlodipine, hydralazine, and losartan)  Dizziness- resolved.  Check bp daily and call if seeing numbers over 150/90 or under 100/70.  Tommy Conley in agreement.  F/u 1 wk phone visit to recheck bp.

## 2020-04-15 ENCOUNTER — Inpatient Hospital Stay: Payer: Medicare Other

## 2020-04-17 DIAGNOSIS — R338 Other retention of urine: Secondary | ICD-10-CM | POA: Diagnosis not present

## 2020-04-18 ENCOUNTER — Inpatient Hospital Stay: Payer: Medicare Other | Admitting: Oncology

## 2020-04-18 ENCOUNTER — Ambulatory Visit: Payer: Medicare Other | Admitting: Family Medicine

## 2020-04-18 ENCOUNTER — Other Ambulatory Visit: Payer: Self-pay

## 2020-04-18 ENCOUNTER — Encounter: Payer: Self-pay | Admitting: Family Medicine

## 2020-04-18 ENCOUNTER — Inpatient Hospital Stay: Payer: Medicare Other | Attending: Nurse Practitioner

## 2020-04-18 ENCOUNTER — Ambulatory Visit (INDEPENDENT_AMBULATORY_CARE_PROVIDER_SITE_OTHER): Payer: Medicare Other | Admitting: Family Medicine

## 2020-04-18 VITALS — BP 132/80 | HR 88 | Temp 97.9°F | Ht 73.0 in | Wt 210.4 lb

## 2020-04-18 VITALS — BP 133/85 | HR 108 | Temp 98.2°F | Resp 18

## 2020-04-18 VITALS — BP 146/82 | HR 103 | Temp 98.2°F | Resp 17 | Ht 73.0 in | Wt 208.8 lb

## 2020-04-18 DIAGNOSIS — C7B02 Secondary carcinoid tumors of liver: Secondary | ICD-10-CM | POA: Diagnosis not present

## 2020-04-18 DIAGNOSIS — C7A098 Malignant carcinoid tumors of other sites: Secondary | ICD-10-CM | POA: Diagnosis not present

## 2020-04-18 DIAGNOSIS — C7A021 Malignant carcinoid tumor of the cecum: Secondary | ICD-10-CM | POA: Diagnosis not present

## 2020-04-18 DIAGNOSIS — Z79899 Other long term (current) drug therapy: Secondary | ICD-10-CM | POA: Diagnosis not present

## 2020-04-18 DIAGNOSIS — C7A012 Malignant carcinoid tumor of the ileum: Secondary | ICD-10-CM | POA: Insufficient documentation

## 2020-04-18 DIAGNOSIS — N401 Enlarged prostate with lower urinary tract symptoms: Secondary | ICD-10-CM | POA: Insufficient documentation

## 2020-04-18 DIAGNOSIS — K635 Polyp of colon: Secondary | ICD-10-CM | POA: Diagnosis not present

## 2020-04-18 DIAGNOSIS — I10 Essential (primary) hypertension: Secondary | ICD-10-CM | POA: Insufficient documentation

## 2020-04-18 DIAGNOSIS — R338 Other retention of urine: Secondary | ICD-10-CM | POA: Diagnosis not present

## 2020-04-18 DIAGNOSIS — K219 Gastro-esophageal reflux disease without esophagitis: Secondary | ICD-10-CM | POA: Insufficient documentation

## 2020-04-18 MED ORDER — OCTREOTIDE ACETATE 30 MG IM KIT
30.0000 mg | PACK | Freq: Once | INTRAMUSCULAR | Status: AC
  Administered 2020-04-18: 11:00:00 30 mg via INTRAMUSCULAR

## 2020-04-18 MED ORDER — OCTREOTIDE ACETATE 30 MG IM KIT
PACK | INTRAMUSCULAR | Status: AC
  Filled 2020-04-18: qty 1

## 2020-04-18 NOTE — Patient Instructions (Signed)
Please provide a copy of your Medical Advanced Directive to have scanned into record. 

## 2020-04-18 NOTE — Patient Instructions (Signed)
Check bp every couple weeks, call if seeing numbers over 150/90.

## 2020-04-18 NOTE — Patient Instructions (Signed)
Octreotide injection solution What is this medicine? OCTREOTIDE (ok TREE oh tide) is used to reduce blood levels of growth hormone in patients with a condition called acromegaly. This medicine also reduces flushing and watery diarrhea caused by certain types of cancer. This medicine may be used for other purposes; ask your health care provider or pharmacist if you have questions. COMMON BRAND NAME(S): Bynfezia, Sandostatin What should I tell my health care provider before I take this medicine? They need to know if you have any of these conditions:  diabetes  gallbladder disease  kidney disease  liver disease  thyroid disease  an unusual or allergic reaction to octreotide, other medicines, foods, dyes, or preservatives  pregnant or trying to get pregnant  breast-feeding How should I use this medicine? This medicine is for injection under the skin or into a vein (only in emergency situations). It is usually given by a health care professional in a hospital or clinic setting. If you get this medicine at home, you will be taught how to prepare and give this medicine. Allow the injection solution to come to room temperature before use. Do not warm it artificially. Use exactly as directed. Take your medicine at regular intervals. Do not take your medicine more often than directed. It is important that you put your used needles and syringes in a special sharps container. Do not put them in a trash can. If you do not have a sharps container, call your pharmacist or healthcare provider to get one. Talk to your pediatrician regarding the use of this medicine in children. Special care may be needed. Overdosage: If you think you have taken too much of this medicine contact a poison control center or emergency room at once. NOTE: This medicine is only for you. Do not share this medicine with others. What if I miss a dose? If you miss a dose, take it as soon as you can. If it is almost time for your  next dose, take only that dose. Do not take double or extra doses. What may interact with this medicine?  bromocriptine  certain medicines for blood pressure, heart disease, irregular heartbeat  cyclosporine  diuretics  medicines for diabetes, including insulin  quinidine This list may not describe all possible interactions. Give your health care provider a list of all the medicines, herbs, non-prescription drugs, or dietary supplements you use. Also tell them if you smoke, drink alcohol, or use illegal drugs. Some items may interact with your medicine. What should I watch for while using this medicine? Visit your doctor or health care professional for regular checks on your progress. To help reduce irritation at the injection site, use a different site for each injection and make sure the solution is at room temperature before use. This medicine may cause decreases in blood sugar. Signs of low blood sugar include chills, cool, pale skin or cold sweats, drowsiness, extreme hunger, fast heartbeat, headache, nausea, nervousness or anxiety, shakiness, trembling, unsteadiness, tiredness, or weakness. Contact your doctor or health care professional right away if you experience any of these symptoms. This medicine may increase blood sugar. Ask your healthcare provider if changes in diet or medicines are needed if you have diabetes. This medicine may cause a decrease in vitamin B12. You should make sure that you get enough vitamin B12 while you are taking this medicine. Discuss the foods you eat and the vitamins you take with your health care professional. What side effects may I notice from receiving this medicine? Side   effects that you should report to your doctor or health care professional as soon as possible:  allergic reactions like skin rash, itching or hives, swelling of the face, lips, or tongue  fast, slow, or irregular heartbeat  right upper belly pain  severe stomach pain  signs  and symptoms of high blood sugar such as being more thirsty or hungry or having to urinate more than normal. You may also feel very tired or have blurry vision.  signs and symptoms of low blood sugar such as feeling anxious; confusion; dizziness; increased hunger; unusually weak or tired; increased sweating; shakiness; cold, clammy skin; irritable; headache; blurred vision; fast heartbeat; loss of consciousness  unusually weak or tired Side effects that usually do not require medical attention (report to your doctor or health care professional if they continue or are bothersome):  diarrhea  dizziness  gas  headache  nausea, vomiting  pain, redness, or irritation at site where injected  upset stomach This list may not describe all possible side effects. Call your doctor for medical advice about side effects. You may report side effects to FDA at 1-800-FDA-1088. Where should I keep my medicine? Keep out of the reach of children. Store in a refrigerator between 2 and 8 degrees C (36 and 46 degrees F). Protect from light. Allow to come to room temperature naturally. Do not use artificial heat. If protected from light, the injection may be stored at room temperature between 20 and 30 degrees C (70 and 86 degrees F) for 14 days. After the initial use, throw away any unused portion of a multiple dose vial after 14 days. Throw away unused portions of the ampules after use. NOTE: This sheet is a summary. It may not cover all possible information. If you have questions about this medicine, talk to your doctor, pharmacist, or health care provider.  2020 Elsevier/Gold Standard (2019-07-06 13:33:09)  

## 2020-04-18 NOTE — Progress Notes (Signed)
[name] OFFICE PROGRESS NOTE   Diagnosis: Carcinoid tumor  INTERVAL HISTORY:   Tommy Conley returns as scheduled.  He continues monthly Sandostatin, last given on 03/20/2020.  He feels well, but he has developed urinary retention over the past several months.  A Foley catheter is in place.  He is followed at the urology center and reports he is being scheduled for a TUR procedure.  Good appetite.  Objective:  Vital signs in last 24 hours:  Blood pressure (!) 146/82, pulse (!) 103, temperature 98.2 F (36.8 C), temperature source Temporal, resp. rate 17, height 6\' 1"  (1.854 m), weight 208 lb 12.8 oz (94.7 kg), SpO2 100 %.    Resp: End inspiratory rales at the posterior lateral base bilaterally, no respiratory distress Cardio: Regular rate and rhythm GI: Fullness in the right upper abdomen without a discrete liver edge Vascular: No leg edema    Lab Results:  Lab Results  Component Value Date   WBC 5.7 08/03/2019   HGB 12.1 (L) 08/03/2019   HCT 37.7 (L) 08/03/2019   MCV 87.3 08/03/2019   PLT 136 (L) 08/03/2019   NEUTROABS 3.8 08/03/2019    CMP  Lab Results  Component Value Date   NA 139 04/08/2020   K 3.3 (L) 04/08/2020   CL 108 04/08/2020   CO2 22 04/08/2020   GLUCOSE 116 (H) 04/08/2020   BUN 13 04/08/2020   CREATININE 0.94 04/08/2020   CALCIUM 8.6 (L) 04/08/2020   PROT 6.4 11/21/2019   ALBUMIN 4.0 11/21/2019   AST 16 11/21/2019   ALT 14 11/21/2019   ALKPHOS 117 11/21/2019   BILITOT 0.5 11/21/2019   GFRNONAA >60 04/08/2020   GFRAA >60 04/08/2020     Imaging:  Netspot images from 04/05/2020 reviewed with Tommy Conley Medications: I have reviewed the patient's current medications.   Assessment/Plan: 1. 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  1. Status post a right colon/ileum resection on 09/29/2011.  2. Wedge biopsy of a right liver lesion on 09/29/2011  confirmed metastatic well-differentiated neuroendocrine carcinoma.  3. CT of the abdomen 08/25/2011 consistent with multiple liver metastases.  4. Elevated preoperative chromogranin A level and a 24-hour urine 5-HIAA level. Stable on repeat 11/16/2011. 5. Restaging CT on 12/28/2011 confirmed slight progression of the metastatic liver lesions. 6. Status post 3 cycles of Xeloda/temozolomide with cycle 3 initiated on 03/12/2012 7. Restaging CT of the abdomen 04/04/2012 revealed slight progression of the metastatic liver lesions and transverse mesocolon adenopathy. 8. Status post Y-90 radioembolization of the right liver on 05/19/2012. 9. Status post Y-90 radioembolization of the left liver on 07/06/2012. 10. Chromogranin A level decreased on 08/29/2012, 24-hour urine 5 HIAA slightly increased on 09/05/2012 11. Restaging CT on 12/05/2012 with a mixed response in the liver 12. Restaging CT 08/11/2013-mild increase in the size of liver lesions. 13. Restaging CT 01/22/2014-stable to slightly improved liver lesions, mild increase in mesenteric adenopathy. Chromogranin A level stable. 14. Chromogranin A level stable 05/01/2014. 15. Chromogranin A higher on 08/02/2014 16. Restaging CT 11/06/2014 with interval enlargement of the enhancing lesions within the left and right hepatic lobe. 17. Initiation of monthly Sandostatin 12/24/2014 18. Status post Y-90 radioembolization of the right liver on 01/03/2015 19. Chromogranin A level stable 02/18/2015 20. Chromogranin A level improved 08/12/2015 21. Restaging CT 09/11/2015 with mild increase in the size of the dominant liver metastasis in the right hepatic lobe. Other smaller liver metastases showed no significant change.  Stable mild lymphadenopathy in central small bowel mesentery and right cardiophrenic angle. Stable subcm right middle lobe pulmonary nodule. 22. Monthly Sandostatin continued 23. Gallium-DOTATATE01/25/2018-increased activity of a dominant  right liver mass and left liver lesion. Increased tracer accumulation in a mesenteric mass 24. Cycle 1Lutathera 03/30/2018 25. Cycle 2 Lutathera 05/25/2018 26. Cycle 3 Lutathera 07/20/2018 27. Cycle 4 Lutathera 09/14/2018 28. Restaging Netspot 10/18/2018-current scan very similar to pretherapy dotatate PET scan. No evidence of new or progressive neuroendocrine tumor. No enlarged tumor sites. 76. Netspot 04/05/2020-new FDG avid liver lesions, new bone metastases, enlarged prostate 2. History of diarrhea (3 to 4 times per day), predating surgery for 8 months. Decrease in daily bowel movements coinciding with discontinuation of Nexium. 3. Hypertension. 4. History of gastroesophageal reflux disease. No reflux symptoms since discontinuing Nexium. 5. Rectal and ascending colon polyps. He underwent a colonoscopy on 12/20/2012 with findings of a sessile polyp in the ascending colon and a sessile polyp in the rectum. Pathology showed hyperplastic polyps. Prior right colon resection noted with a normal appearing ileocolonic anastomosis. Small internal hemorrhoids noted. Repeat colonoscopy in 5 years. 6. History of Iron deficiency anemia 7. 08/12/2016 syncopal episode status post evaluation in the emergency department 8. Left-sided hearing loss July 2020 9. Urinary retention March 2021-followed at the urology center, TUR planned    Disposition: Tommy Conley has metastatic carcinoid tumor.  The chromogranin A was higher in February.  The restaging Netspot on 04/05/2020 reveals evidence of disease progression in the liver and bones.  I reviewed the Netspot images with Tommy Conley.  I recommend repeat treatment with Lutathera.  He agrees, but would like to delay until after treatment of the prostatic hypertrophy.  He will receive Sandostatin today.  He will return for an office visit and Sandostatin in 1 month.  We will refer him to Dr. Leonia Conley after the next office visit.  He is in the process of scheduling a  TUR procedure.  Betsy Coder, MD  04/18/2020  1:34 PM

## 2020-04-18 NOTE — Progress Notes (Signed)
Patient ID: Tommy Conley, male    DOB: [birth year], 69 y.o.   MRN: [id number]   Chief Complaint  Patient presents with  . Hypertension    follow up   Subjective:   HPI  HPI Pt seen for follow up on HTN.  Was having low bp on last visit, and had stopped his medications.  Noticed pt had high heart rate since not taking his metoprolol.  Restarted on the metoprolol xl 50mg .  No chest pain, HA, sob, or leg swelling. Has been checking at home daily and bp is ranging around 130/80s.   Pt did mention that he is going to have surgery on prostate soon with Urology.  Has been continuing to wear the urine catheter and leg bag.   Medical History Tommy has a past medical history of Allergy, Barrett's esophagus, Carcinoid tumor of cecum, Colon polyp, GERD (gastroesophageal reflux disease), Heart murmur, Hypertension, IBS (irritable bowel syndrome), Malignant carcinoid tumors of other sites [facility]) (08/25/2016), and Perennial allergic rhinitis.   Outpatient Encounter Medications as of 04/18/2020  Medication Sig  . metoprolol succinate (TOPROL-XL) 50 MG 24 hr tablet TAKE 1 TABLET BY MOUTH EVERY DAY WITH OR IMMEDIATELY FOLLOWING A MEAL  . potassium chloride SA (KLOR-CON) 20 MEQ tablet TAKE 1 TABLET(20 MEQ) BY MOUTH TWICE DAILY (Patient taking differently: 20 mEq daily. )  . pramoxine-hydrocortisone (PROCTOCREAM-HC) 1-1 % rectal cream Place 1 application rectally 3 (three) times daily. (Patient not taking: Reported on 04/18/2020)  . prochlorperazine (COMPAZINE) 5 MG tablet Take 1 tablet (5 mg total) by mouth every 6 (six) hours as needed for nausea or vomiting. (Patient not taking: Reported on 04/18/2020)  . PROCTOSOL HC 2.5 % rectal cream PLACE 1 APPLICATION RECTALLY 3 (THREE) TIMES DAILY. (Patient not taking: Reported on 04/18/2020)  . sildenafil (VIAGRA) 100 MG tablet Take 1 tablet (100 mg total) by mouth daily as needed for erectile dysfunction. (Patient not taking: Reported on 04/18/2020)  .  tamsulosin (FLOMAX) 0.4 MG CAPS capsule TAKE 1 CAPSULE(0.4 MG) BY MOUTH AT BEDTIME (Patient taking differently: 0.8 mg. TAKE 1 CAPSULE(0.4 MG) BY MOUTH AT BEDTIME)  . [DISCONTINUED] ondansetron (ZOFRAN) 8 MG tablet Take 1 tablet (8 mg total) by mouth 2 (two) times daily as needed for nausea or vomiting. (Patient not taking: Reported on 04/18/2020)  . [EXPIRED] octreotide (SANDOSTATIN LAR) IM injection 30 mg    No facility-administered encounter medications on file as of 04/18/2020.     Review of Systems  HENT: Negative for congestion and sore throat.   Respiratory: Negative for shortness of breath.   Cardiovascular: Negative for chest pain, palpitations and leg swelling.  Skin: Negative for rash.  Neurological: Negative for dizziness, weakness, light-headedness, numbness and headaches.     Vitals BP 132/80   Pulse 88   Temp 97.9 F (36.6 C) (Oral)   Ht 6\' 1"  (1.854 m)   Wt 210 lb 6.4 oz (95.4 kg)   SpO2 100%   BMI 27.76 kg/m   Objective:   Physical Exam Constitutional:      General: He is not in acute distress.    Appearance: Normal appearance.  HENT:     Head: Normocephalic and atraumatic.  Cardiovascular:     Rate and Rhythm: Normal rate and regular rhythm.     Pulses: Normal pulses.     Heart sounds: Normal heart sounds. No murmur.  Pulmonary:     Effort: Pulmonary effort is normal.     Breath sounds: Normal breath sounds. No  wheezing, rhonchi or rales.  Musculoskeletal:        General: Normal range of motion.     Right lower leg: No edema.     Left lower leg: No edema.  Skin:    General: Skin is warm and dry.     Findings: No rash.  Neurological:     General: No focal deficit present.     Mental Status: He is alert and oriented to person, place, and time.     Cranial Nerves: No cranial nerve deficit.  Psychiatric:        Mood and Affect: Mood normal.        Behavior: Behavior normal.      Assessment and Plan   1. Essential hypertension, benign    Bp  and HR improved, with adding back the metoprolol xr 50mg .  - may need to add back the other bp meds, but for now bp is stable with just the metoprolol.  Cont to monitor and call if seeing numbers over 150/85.   Pt in agreement.  F/u in 9/21 for his routine check up on HTN.

## 2020-04-19 ENCOUNTER — Other Ambulatory Visit: Payer: Self-pay | Admitting: Urology

## 2020-04-25 ENCOUNTER — Other Ambulatory Visit: Payer: Self-pay | Admitting: Family Medicine

## 2020-04-25 ENCOUNTER — Other Ambulatory Visit: Payer: Self-pay | Admitting: *Deleted

## 2020-04-25 ENCOUNTER — Telehealth: Payer: Self-pay | Admitting: *Deleted

## 2020-04-25 DIAGNOSIS — C7A021 Malignant carcinoid tumor of the cecum: Secondary | ICD-10-CM

## 2020-04-25 MED ORDER — AMLODIPINE BESYLATE 10 MG PO TABS: 10.0000 mg | ORAL_TABLET | Freq: Every day | ORAL | 0 refills | Status: DC

## 2020-04-25 MED ORDER — TAMSULOSIN HCL 0.4 MG PO CAPS: ORAL_CAPSULE | ORAL | 1 refills | Status: DC

## 2020-04-25 NOTE — Telephone Encounter (Signed)
Seen end of April for htn. Med list in epic has two directions one qhs and two qhs so I called pt to clarify how he is taking and he states he takes 2 qhs. Ok to send in for #60 2 qhs?

## 2020-04-25 NOTE — Telephone Encounter (Signed)
Called and discussed with pt and he verbalized understanding. Med sent to pharm.

## 2020-04-25 NOTE — Telephone Encounter (Signed)
Yes 6 mo worth

## 2020-04-25 NOTE — Telephone Encounter (Signed)
Hi,   Have pt start back on his 10mg  amlodipine daily. Continue to check BP daily and call back if seeing high numbers again 150/90s and above.   Thx   Dr. Lovena Le

## 2020-04-25 NOTE — Telephone Encounter (Signed)
Pt states blood pressure has been going back up this week. Around 154/94 when he checks it. No dizziness, no headache, no chest pain, just having fatigue. Taking metoprolol 50mg .   [facility] scales.

## 2020-05-06 DIAGNOSIS — R338 Other retention of urine: Secondary | ICD-10-CM | POA: Diagnosis not present

## 2020-05-08 ENCOUNTER — Other Ambulatory Visit: Payer: Self-pay | Admitting: Urology

## 2020-05-08 ENCOUNTER — Encounter (HOSPITAL_BASED_OUTPATIENT_CLINIC_OR_DEPARTMENT_OTHER): Payer: Self-pay | Admitting: Urology

## 2020-05-08 ENCOUNTER — Other Ambulatory Visit: Payer: Self-pay

## 2020-05-08 NOTE — Progress Notes (Addendum)
Spoke w/ via phone for pre-op interview---patient Lab needs dos----  I stat 8, ekg            Lab results------echo 06-16-2017 epic, lov 04-18-2020 dr Benay Spice oncology epic COVID test ------05-09-2020@1100  am Arrive at -------645 am 05-13-2020 NPO after ------midnight Medications to take morning of surgery -----Amlodipine, Metoprolol Succinate Diabetic medication -----n/a Patient Special Instructions -----none Pre-Op special Istructions ----patient given overnight stay instructions including bring all prescription medications in original containers, daughter tiarar driver home after overnight stay Patient verbalized understanding of instructions that were given at this phone interview. Patient denies shortness of breath, chest pain, fever, cough a this phone interview.

## 2020-05-09 ENCOUNTER — Other Ambulatory Visit (HOSPITAL_COMMUNITY)
Admission: RE | Admit: 2020-05-09 | Discharge: 2020-05-09 | Disposition: A | Discharge status: Home / Self Care | Payer: Medicare Other | Source: Ambulatory Visit | Attending: Urology | Admitting: Urology

## 2020-05-09 DIAGNOSIS — Z01812 Encounter for preprocedural laboratory examination: Secondary | ICD-10-CM | POA: Diagnosis not present

## 2020-05-09 DIAGNOSIS — Z20822 Contact with and (suspected) exposure to covid-19: Secondary | ICD-10-CM | POA: Insufficient documentation

## 2020-05-09 LAB — SARS CORONAVIRUS 2 (TAT 6-24 HRS): SARS Coronavirus 2: NEGATIVE

## 2020-05-09 NOTE — Discharge Instructions (Signed)

## 2020-05-09 NOTE — H&P (Signed)
CC/HPI: 03/27/20: The patient has urinary retention.  He is seen in the emergency room on 03/16/20. He was found have clear urine yet a urine culture was performed and of course found to be negative.  He said at the time of his routine physical exam about 3 weeks ago he was told his prostate was spongy and was placed on a 20 day course of Cipro. He had been experiencing voiding symptoms consisting of urgency and small frequent voidings at night. He had been placed on tamsulosin in 2/21. He said after having his catheter placed initially he had difficulty with leakage around the catheter and therefore was changed and has been draining properly since that time.   04/10/20: Mr. Mccully has a PMHx of malignant carcinoid tumor of the cecum, HTN, ED and BPH. Prior to the past 2 months he was voiding well and had no issues with UTIs, or incomplete emptying. Beginning in Feb of this year, he developed acute urinary retention that required catheterization. He was subsequently told to increase his tamsulsoin. He initially passed his first TOV, but had to return to the ED to have the catheter replaced. At his 2nd voiding trial he was increased to tamsulsoin 0.8mg  and again passed his voiding trial. He was doing well for a few days, but began having pain, discomfort and inability to void on 04/05/20, he went to the ED on 04/08/20 and his catheter was replaced. He had over 465mls drained from his bladder. He denies constipation, fevers, chills and his culture at the hospital was negative for growth. He presents today with concerns of where to go next. He is also experiencing some dizziness today. His catheter is in place and draining.   04/17/20: The patient has returned today to discuss management options for his urinary retention. He continues to have difficulty with recurrent episodes of urinary retention after undergoing voiding trial despite being on 0.8 mg of tamsulosin.     ALLERGIES: No Allergies    MEDICATIONS:  Metoprolol Succinate  Tamsulosin Hcl 0.4 mg capsule  Amlodipine Besylate 10 mg tablet  Gallium Citrate Ga-67 74 mbq/ml (2 mci/ml) vial  Hydralazine Hcl 50 mg tablet  Losartan Potassium 100 mg tablet  Octreotide Acetate  Potassium Chloride     GU PSH: Complex Uroflow - 03/27/2020     NON-GU PSH: No Non-GU PSH    GU PMH: BPH w/LUTS, He has a pretty large prostate with some baseline obstructive voiding symptoms. - 03/27/2020 Paraphimosis, He was he was found to have an iatrogenic paraphimosis. The foreskin had not been replaced after his last catheter insertion. This was easily reduced in the office today. - 03/27/2020 Urinary Retention - 03/27/2020    NON-GU PMH: Colon Cancer, History GERD    FAMILY HISTORY: No Family History    SOCIAL HISTORY: Marital Status: Single Preferred Language: English; Ethnicity: Not Hispanic Or Latino; Race: Black or African American Current Smoking Status: Patient smokes occasionally. Smokes less than 1/2 pack per day.   Tobacco Use Assessment Completed: Used Tobacco in last 30 days? Has never drank.  Drinks 1 caffeinated drink per day.    REVIEW OF SYSTEMS:    GU Review Male:   Patient denies frequent urination, hard to postpone urination, burning/ pain with urination, get up at night to urinate, leakage of urine, stream starts and stops, trouble starting your stream, have to strain to urinate , erection problems, and penile pain.  Gastrointestinal (Upper):   Patient denies nausea, vomiting, and indigestion/ heartburn.  Gastrointestinal (  Lower):   Patient denies diarrhea and constipation.  Constitutional:   Patient denies fatigue, night sweats, fever, and weight loss.  Skin:   Patient denies skin rash/ lesion and itching.  Eyes:   Patient denies blurred vision and double vision.  Ears/ Nose/ Throat:   Patient denies sore throat and sinus problems.  Hematologic/Lymphatic:   Patient denies swollen glands and easy bruising.  Cardiovascular:   Patient  denies leg swelling and chest pains.  Respiratory:   Patient denies cough and shortness of breath.  Endocrine:   Patient denies excessive thirst.  Musculoskeletal:   Patient denies back pain and joint pain.  Neurological:   Patient denies headaches and dizziness.  Psychologic:   Patient denies depression and anxiety.   VITAL SIGNS:    Weight 210 lb / 95.25 kg  Height 73 in / 185.42 cm  BP 144/82 mmHg  Pulse 100 /min  Temperature 96.4 F / 35.7 C  BMI 27.7 kg/m   GU PHYSICAL EXAMINATION:    Scrotum: No lesions. No edema. No cysts. No warts.  Epididymides: Right: no spermatocele, no masses, no cysts, no tenderness, no induration, no enlargement. Left: no spermatocele, no masses, no cysts, no tenderness, no induration, no enlargement.  Testes: No tenderness, no swelling, no enlargement left testes. No tenderness, no swelling, no enlargement right testes. Normal location left testes. Normal location right testes. No mass, no cyst, no varicocele, no hydrocele left testes. No mass, no cyst, no varicocele, no hydrocele right testes.  Urethral Meatus: Normal size. No lesion, no wart, no discharge, no polyp. Normal location.  Penis: Circumcised with foley catheter. no warts, no cracks. No dorsal Peyronie's plaques, no left corporal Peyronie's plaques, no right corporal Peyronie's plaques, no scarring, no warts. No balanitis, no meatal stenosis.   MULTI-SYSTEM PHYSICAL EXAMINATION:    Constitutional: Well-nourished. No physical deformities. Normally developed. Good grooming.  Neck: Neck symmetrical, not swollen. Normal tracheal position.  Respiratory: No labored breathing, no use of accessory muscles.   Cardiovascular: Normal temperature, normal extremity pulses, no swelling, no varicosities.  Lymphatic: No enlargement of neck, axillae, groin.  Skin: No paleness, no jaundice, no cyanosis. No lesion, no ulcer, no rash.  Neurologic / Psychiatric: Oriented to time, oriented to place, oriented to  person. No depression, no anxiety, no agitation.  Gastrointestinal: No mass, no tenderness, no rigidity, non obese abdomen.  Eyes: Normal conjunctivae. Normal eyelids.  Ears, Nose, Mouth, and Throat: Left ear no scars, no lesions, no masses. Right ear no scars, no lesions, no masses. Nose no scars, no lesions, no masses. Normal hearing. Normal lips.  Musculoskeletal: Normal gait and station of head and neck.    Complexity of Data:  Records Review:   Previous Patient Records, POC Tool   PROCEDURES: None   ASSESSMENT/PLAN:      ICD-10 Details  1 GU:   BPH w/LUTS - N40.1 Chronic, Stable  2   Urinary Retention - R33.8 Chronic, Stable     We discussed all the options available to him for his urinary retention. He did have any difficulty urinating until he went into urinary retention and so we discussed every option including continued Foley catheter drainage and SP tube placement as well as self catheterization. He did want to consider any of these. We then discussed outlet resistance surgery and I discussed office based procedures such as rezume and Urolift as well as surgical procedure such as TURP. He was most interested in TURP as he has had friends that have  undergone this procedure and were pleased with the results. I therefore went over the procedure with him in detail and drew him a picture. We discussed how the procedure was performed, its risks and complications, the probability of success, the need for an overnight stay in the hospital as well as the anticipated postoperative course. He understands and would like to proceed with surgery.  Preop urine culture on 05/08/20 was positive for Klebsiella that was sensitive to Augmentin.  The patient was placed on Augmentin and has remained on that until the day of surgery.  It was also sensitive to Cipro and therefore he will receive preoperative Cipro IV as well.

## 2020-05-13 ENCOUNTER — Encounter (HOSPITAL_BASED_OUTPATIENT_CLINIC_OR_DEPARTMENT_OTHER)
Admission: RE | Disposition: A | Discharge status: Home / Self Care | Payer: Self-pay | Source: Home / Self Care | Attending: Urology

## 2020-05-13 ENCOUNTER — Other Ambulatory Visit: Payer: Self-pay

## 2020-05-13 ENCOUNTER — Ambulatory Visit (HOSPITAL_BASED_OUTPATIENT_CLINIC_OR_DEPARTMENT_OTHER): Payer: Medicare Other | Admitting: Anesthesiology

## 2020-05-13 ENCOUNTER — Ambulatory Visit (HOSPITAL_BASED_OUTPATIENT_CLINIC_OR_DEPARTMENT_OTHER)
Admission: RE | Admit: 2020-05-13 | Discharge: 2020-05-14 | Disposition: A | Discharge status: Home / Self Care | Payer: Medicare Other | Attending: Urology | Admitting: Urology

## 2020-05-13 ENCOUNTER — Encounter (HOSPITAL_BASED_OUTPATIENT_CLINIC_OR_DEPARTMENT_OTHER): Payer: Self-pay | Admitting: Urology

## 2020-05-13 DIAGNOSIS — Z79899 Other long term (current) drug therapy: Secondary | ICD-10-CM | POA: Diagnosis not present

## 2020-05-13 DIAGNOSIS — I10 Essential (primary) hypertension: Secondary | ICD-10-CM | POA: Diagnosis not present

## 2020-05-13 DIAGNOSIS — F17210 Nicotine dependence, cigarettes, uncomplicated: Secondary | ICD-10-CM | POA: Insufficient documentation

## 2020-05-13 DIAGNOSIS — N210 Calculus in bladder: Secondary | ICD-10-CM | POA: Insufficient documentation

## 2020-05-13 DIAGNOSIS — Z85030 Personal history of malignant carcinoid tumor of large intestine: Secondary | ICD-10-CM | POA: Insufficient documentation

## 2020-05-13 DIAGNOSIS — R338 Other retention of urine: Secondary | ICD-10-CM | POA: Diagnosis not present

## 2020-05-13 DIAGNOSIS — N400 Enlarged prostate without lower urinary tract symptoms: Secondary | ICD-10-CM | POA: Diagnosis present

## 2020-05-13 DIAGNOSIS — K589 Irritable bowel syndrome without diarrhea: Secondary | ICD-10-CM | POA: Diagnosis not present

## 2020-05-13 DIAGNOSIS — N401 Enlarged prostate with lower urinary tract symptoms: Secondary | ICD-10-CM | POA: Diagnosis not present

## 2020-05-13 HISTORY — PX: TRANSURETHRAL RESECTION OF PROSTATE: SHX73

## 2020-05-13 HISTORY — DX: Benign prostatic hyperplasia without lower urinary tract symptoms: N40.0

## 2020-05-13 HISTORY — DX: Presence of other specified devices: Z97.8

## 2020-05-13 LAB — POCT I-STAT, CHEM 8
BUN: 14 mg/dL (ref 8–23)
Calcium, Ion: 1.3 mmol/L (ref 1.15–1.40)
Chloride: 105 mmol/L (ref 98–111)
Creatinine, Ser: 1 mg/dL (ref 0.61–1.24)
Glucose, Bld: 88 mg/dL (ref 70–99)
HCT: 40 % (ref 39.0–52.0)
Hemoglobin: 13.6 g/dL (ref 13.0–17.0)
Potassium: 3.5 mmol/L (ref 3.5–5.1)
Sodium: 140 mmol/L (ref 135–145)
TCO2: 24 mmol/L (ref 22–32)

## 2020-05-13 SURGERY — TURP (TRANSURETHRAL RESECTION OF PROSTATE)
Anesthesia: General | Site: Bladder

## 2020-05-13 MED ORDER — FENTANYL CITRATE (PF) 100 MCG/2ML IJ SOLN
INTRAMUSCULAR | Status: AC
  Filled 2020-05-13: qty 2

## 2020-05-13 MED ORDER — PHENYLEPHRINE 40 MCG/ML (10ML) SYRINGE FOR IV PUSH (FOR BLOOD PRESSURE SUPPORT)
PREFILLED_SYRINGE | INTRAVENOUS | Status: AC
  Filled 2020-05-13: qty 10

## 2020-05-13 MED ORDER — ESMOLOL HCL 100 MG/10ML IV SOLN
INTRAVENOUS | Status: AC
  Filled 2020-05-13: qty 10

## 2020-05-13 MED ORDER — ONDANSETRON HCL 4 MG/2ML IJ SOLN: 4.0000 mg | INTRAMUSCULAR | Status: DC | PRN

## 2020-05-13 MED ORDER — CIPROFLOXACIN IN D5W 400 MG/200ML IV SOLN
INTRAVENOUS | Status: AC
  Filled 2020-05-13: qty 200

## 2020-05-13 MED ORDER — LIDOCAINE 2% (20 MG/ML) 5 ML SYRINGE
INTRAMUSCULAR | Status: AC
  Filled 2020-05-13: qty 5

## 2020-05-13 MED ORDER — LACTATED RINGERS IV SOLN: INTRAVENOUS | Status: DC

## 2020-05-13 MED ORDER — MIDAZOLAM HCL 2 MG/2ML IJ SOLN
INTRAMUSCULAR | Status: AC
  Filled 2020-05-13: qty 2

## 2020-05-13 MED ORDER — PHENYLEPHRINE 40 MCG/ML (10ML) SYRINGE FOR IV PUSH (FOR BLOOD PRESSURE SUPPORT)
PREFILLED_SYRINGE | INTRAVENOUS | Status: DC | PRN
  Administered 2020-05-13: 120 ug via INTRAVENOUS
  Administered 2020-05-13: 160 ug via INTRAVENOUS
  Administered 2020-05-13: 80 ug via INTRAVENOUS
  Administered 2020-05-13: 160 ug via INTRAVENOUS
  Administered 2020-05-13: 120 ug via INTRAVENOUS
  Administered 2020-05-13: 80 ug via INTRAVENOUS

## 2020-05-13 MED ORDER — ACETAMINOPHEN 325 MG PO TABS: 650.0000 mg | ORAL_TABLET | ORAL | Status: DC | PRN

## 2020-05-13 MED ORDER — CIPROFLOXACIN IN D5W 400 MG/200ML IV SOLN
400.0000 mg | Freq: Two times a day (BID) | INTRAVENOUS | Status: DC
  Administered 2020-05-13: 400 mg via INTRAVENOUS

## 2020-05-13 MED ORDER — FENTANYL CITRATE (PF) 100 MCG/2ML IJ SOLN: 25.0000 ug | INTRAMUSCULAR | Status: DC | PRN

## 2020-05-13 MED ORDER — OXYCODONE HCL 5 MG/5ML PO SOLN: 5.0000 mg | Freq: Once | ORAL | Status: DC | PRN

## 2020-05-13 MED ORDER — CIPROFLOXACIN IN D5W 400 MG/200ML IV SOLN
400.0000 mg | Freq: Once | INTRAVENOUS | Status: AC
  Administered 2020-05-13: 400 mg via INTRAVENOUS

## 2020-05-13 MED ORDER — SODIUM CHLORIDE 0.9 % IR SOLN
Status: DC | PRN
  Administered 2020-05-13: 18000 mL via INTRAVESICAL

## 2020-05-13 MED ORDER — BELLADONNA ALKALOIDS-OPIUM 16.2-60 MG RE SUPP: 1.0000 | Freq: Four times a day (QID) | RECTAL | Status: DC | PRN

## 2020-05-13 MED ORDER — ONDANSETRON HCL 4 MG/2ML IJ SOLN
INTRAMUSCULAR | Status: DC | PRN
  Administered 2020-05-13: 4 mg via INTRAVENOUS

## 2020-05-13 MED ORDER — DEXAMETHASONE SODIUM PHOSPHATE 10 MG/ML IJ SOLN
INTRAMUSCULAR | Status: AC
  Filled 2020-05-13: qty 1

## 2020-05-13 MED ORDER — PROPOFOL 10 MG/ML IV BOLUS
INTRAVENOUS | Status: AC
  Filled 2020-05-13: qty 40

## 2020-05-13 MED ORDER — FENTANYL CITRATE (PF) 100 MCG/2ML IJ SOLN
INTRAMUSCULAR | Status: DC | PRN
  Administered 2020-05-13: 50 ug via INTRAVENOUS
  Administered 2020-05-13: 25 ug via INTRAVENOUS
  Administered 2020-05-13: 50 ug via INTRAVENOUS
  Administered 2020-05-13: 25 ug via INTRAVENOUS

## 2020-05-13 MED ORDER — SODIUM CHLORIDE 0.9 % IV SOLN: INTRAVENOUS | Status: DC

## 2020-05-13 MED ORDER — HYDROMORPHONE HCL 1 MG/ML IJ SOLN: 0.5000 mg | INTRAMUSCULAR | Status: DC | PRN

## 2020-05-13 MED ORDER — BACITRACIN-NEOMYCIN-POLYMYXIN 400-5-5000 EX OINT: 1.0000 "application " | TOPICAL_OINTMENT | Freq: Three times a day (TID) | CUTANEOUS | Status: DC | PRN

## 2020-05-13 MED ORDER — ONDANSETRON HCL 4 MG/2ML IJ SOLN
INTRAMUSCULAR | Status: AC
  Filled 2020-05-13: qty 2

## 2020-05-13 MED ORDER — SODIUM CHLORIDE 0.9 % IR SOLN
3000.0000 mL | Status: DC
  Administered 2020-05-14: 3000 mL

## 2020-05-13 MED ORDER — ESMOLOL HCL 100 MG/10ML IV SOLN
INTRAVENOUS | Status: DC | PRN
  Administered 2020-05-13: 20 mg via INTRAVENOUS

## 2020-05-13 MED ORDER — MIDAZOLAM HCL 2 MG/2ML IJ SOLN
INTRAMUSCULAR | Status: DC | PRN
  Administered 2020-05-13: 1 mg via INTRAVENOUS

## 2020-05-13 MED ORDER — OXYCODONE HCL 5 MG PO TABS: 5.0000 mg | ORAL_TABLET | Freq: Once | ORAL | Status: DC | PRN

## 2020-05-13 MED ORDER — LIDOCAINE 2% (20 MG/ML) 5 ML SYRINGE
INTRAMUSCULAR | Status: DC | PRN
  Administered 2020-05-13: 60 mg via INTRAVENOUS

## 2020-05-13 MED ORDER — AMLODIPINE BESYLATE 10 MG PO TABS
10.0000 mg | ORAL_TABLET | Freq: Every day | ORAL | Status: DC
  Administered 2020-05-13: 10 mg via ORAL
  Filled 2020-05-13: qty 1

## 2020-05-13 MED ORDER — DEXAMETHASONE SODIUM PHOSPHATE 10 MG/ML IJ SOLN
INTRAMUSCULAR | Status: DC | PRN
  Administered 2020-05-13: 10 mg via INTRAVENOUS

## 2020-05-13 MED ORDER — PROPOFOL 10 MG/ML IV BOLUS
INTRAVENOUS | Status: DC | PRN
  Administered 2020-05-13: 20 mg via INTRAVENOUS
  Administered 2020-05-13: 180 mg via INTRAVENOUS

## 2020-05-13 MED ORDER — ONDANSETRON HCL 4 MG/2ML IJ SOLN: 4.0000 mg | Freq: Once | INTRAMUSCULAR | Status: DC | PRN

## 2020-05-13 MED ORDER — HYDROCODONE-ACETAMINOPHEN 5-325 MG PO TABS: 1.0000 | ORAL_TABLET | ORAL | Status: DC | PRN

## 2020-05-13 SURGICAL SUPPLY — 29 items
BAG DRAIN URO-CYSTO SKYTR STRL (DRAIN) ×2 IMPLANT
BAG DRN RND TRDRP ANRFLXCHMBR (UROLOGICAL SUPPLIES) ×1
BAG DRN UROCATH (DRAIN) ×1
BAG URINE DRAIN 2000ML AR STRL (UROLOGICAL SUPPLIES) ×1 IMPLANT
BAG URINE LEG 500ML (DRAIN) IMPLANT
CATH FOLEY 3WAY 20FR (CATHETERS) IMPLANT
CATH FOLEY 3WAY 30CC 20FR (CATHETERS) ×1 IMPLANT
CATH FOLEY 3WAY 30CC 24FR (CATHETERS)
CATH HEMA 3WAY 30CC 24FR COUDE (CATHETERS) IMPLANT
CATH HEMA 3WAY 30CC 24FR RND (CATHETERS) IMPLANT
CATH URTH STD 24FR FL 3W 2 (CATHETERS) IMPLANT
CLOTH BEACON ORANGE TIMEOUT ST (SAFETY) ×2 IMPLANT
EVACUATOR MICROVAS BLADDER (UROLOGICAL SUPPLIES) ×2 IMPLANT
GLOVE BIO SURGEON STRL SZ8 (GLOVE) ×2 IMPLANT
GOWN STRL REUS W/TWL LRG LVL3 (GOWN DISPOSABLE) IMPLANT
GOWN STRL REUS W/TWL XL LVL3 (GOWN DISPOSABLE) ×2 IMPLANT
HOLDER FOLEY CATH W/STRAP (MISCELLANEOUS) ×1 IMPLANT
IV NS IRRIG 3000ML ARTHROMATIC (IV SOLUTION) ×10 IMPLANT
KIT TURNOVER CYSTO (KITS) ×2 IMPLANT
LOOP CUT BIPOLAR 24F LRG (ELECTROSURGICAL) ×2 IMPLANT
MANIFOLD NEPTUNE II (INSTRUMENTS) ×2 IMPLANT
PLUG CATH AND CAP STER (CATHETERS) IMPLANT
SYR 30ML LL (SYRINGE) ×1 IMPLANT
SYR TOOMEY IRRIG 70ML (MISCELLANEOUS)
SYRINGE TOOMEY IRRIG 70ML (MISCELLANEOUS) IMPLANT
TRAY CYSTO PACK (CUSTOM PROCEDURE TRAY) ×2 IMPLANT
TUBE CONNECTING 12X1/4 (SUCTIONS) ×2 IMPLANT
TUBING UROLOGY SET (TUBING) ×2 IMPLANT
WATER STERILE IRR 3000ML UROMA (IV SOLUTION) IMPLANT

## 2020-05-13 NOTE — Anesthesia Procedure Notes (Signed)
Procedure Name: LMA Insertion Date/Time: 05/13/2020 9:04 AM Performed by: Mechele Claude, CRNA Pre-anesthesia Checklist: Patient identified, Emergency Drugs available, Suction available and Patient being monitored Patient Re-evaluated:Patient Re-evaluated prior to induction Oxygen Delivery Method: Circle system utilized Preoxygenation: Pre-oxygenation with 100% oxygen Induction Type: IV induction Ventilation: Mask ventilation without difficulty LMA: LMA inserted LMA Size: 5.0 Number of attempts: 1 Airway Equipment and Method: Bite block Placement Confirmation: positive ETCO2 Tube secured with: Tape Dental Injury: Teeth and Oropharynx as per pre-operative assessment

## 2020-05-13 NOTE — Transfer of Care (Signed)
Last Vitals:  Vitals Value Taken Time  BP 121/78 05/13/20 1015  Temp 37 C 05/13/20 1015  Pulse 91 05/13/20 1017  Resp 19 05/13/20 1017  SpO2 100 % 05/13/20 1017  Vitals shown include unvalidated device data.  Last Pain:  Vitals:   05/13/20 1015  TempSrc: Oral         Immediate Anesthesia Transfer of Care Note  Patient: Tommy Conley  Procedure(s) Performed: Procedure(s) (LRB): TRANSURETHRAL RESECTION OF THE PROSTATE (TURP) (N/A)  Patient Location: PACU  Anesthesia Type: General  Level of Consciousness: awake, alert  and oriented  Airway & Oxygen Therapy: Patient Spontanous Breathing and Patient connected to nasal cannula oxygen  Post-op Assessment: Report given to PACU RN and Post -op Vital signs reviewed and stable  Post vital signs: Reviewed and stable  Complications: No apparent anesthesia complications

## 2020-05-13 NOTE — Op Note (Signed)
PATIENT:  Tommy Conley  PRE-OPERATIVE DIAGNOSIS: BPH with urinary retention  POST-OPERATIVE DIAGNOSIS: 1.  BPH with urinary retention. Numeral 2.  Bladder calculi.  PROCEDURE: 1.  TURP 2.  Removal of bladder calculi.  SURGEON:  Claybon Jabs  INDICATION: Tommy Conley is a 69 year old male who developed urinary retention on 03/16/2020.  He was placed on alpha blockade therapy at a high dose and underwent voiding trials but these were unsuccessful.  We therefore discussed the options for further management and he is elected to proceed with transurethral resection of the prostate.  A preoperative urine culture was positive for Klebsiella that was sensitive to Augmentin.  He was placed on Augmentin and remained on that until the time of surgery.  It was also sensitive to Cipro and he received IV Cipro preop.  ANESTHESIA:  General  EBL:  Minimal  DRAINS: 68 [place], three-way Foley catheter on continuous irrigation.  SPECIMEN:  Prostate chips to pathology  After informed consent the patient was brought to the major OR and placed on the table. He was administered general anesthesia and then moved to the dorsal lithotomy position. His genitalia was sterilely prepped and draped and an official timeout was then performed.  Initially the 28 French resectoscope sheath with the visual obturator was passed into the bladder and the obturator removed. I then inserted the resectoscope element with 30 lens and  performed a systematic inspection of the bladder. I noted the bladder had 2-3+ trabeculation but was free of any tumor or lesions.  Multiple bladder stones were noted on the floor of the bladder.  These appeared to be a stone that had formed on the catheter as they were flat and suggestive of stones that may have formed on the Foley balloon.  The ureteral orifices were noted to be of normal configuration and position. Withdrawing the scope into the prostatic urethra I noted obstructing  bilobar hypertrophy with elongation of the prostatic urethra and a large, obstructing median lobe component.  Resection was then begun I. first resected the median lobe in the midline down to the level of the bladder neck. I then began resecting the left lobe of the prostate by resecting first from the level of the bladder neck back to the level of the Veru at the 5:00 position and then progressed in a counterclockwise direction resecting all of the adenomatous tissue of the left lobe down to the surgical capsule. Bleeding points were cauterized as they were encountered. I then turned my attention to the right lobe of the prostate and it was resected in an identical fashion. Tissue in the area of the apex was then resected circumferentially with care being taken to maintain the resection proximal to the Veru at all times. The prostatic chips were then flushed into the bladder and the Microvasive evacuator was then used to evacuate all chips from the bladder. Reinspection of the bladder revealed the mucosa to be intact, the ureteral orifices intact as well and well away from the bladder neck and area of resection. There were no prostatic chips remaining within the bladder. The prostatic capsule was intact throughout with no perforation and there was no active bleeding noted at the end of the procedure.  The resectoscope was therefore removed and the 20 French three-way Foley catheter was then inserted and balloon was filled to 30 cc. This was placed on mild traction and the bladder was irrigated with the irrigant returning clear. The catheter was then hooked to closed system  drainage and continuous irrigation and the patient was awakened and taken to recovery room in stable and satisfactory condition. He tolerated the procedure well and there were no intraoperative complications.  PLAN OF CARE: Observation overnight with anticipated discharge in the morning.  PATIENT DISPOSITION:  PACU - Hemodynamically  stable.

## 2020-05-13 NOTE — Anesthesia Preprocedure Evaluation (Addendum)
Anesthesia Evaluation  Patient identified by MRN, date of birth, ID band Patient awake    Reviewed: Allergy & Precautions, NPO status , Patient's Chart, lab work & pertinent test results  History of Anesthesia Complications Negative for: history of anesthetic complications  Airway Mallampati: I  TM Distance: >3 FB Neck ROM: Full    Dental  (+) Teeth Intact   Pulmonary neg pulmonary ROS, Current Smoker and Patient abstained from smoking.,    Pulmonary exam normal        Cardiovascular hypertension, Normal cardiovascular exam     Neuro/Psych negative neurological ROS  negative psych ROS   GI/Hepatic Neg liver ROS, GERD  ,  Endo/Other  negative endocrine ROS  Renal/GU negative Renal ROS   BPH    Musculoskeletal negative musculoskeletal ROS (+)   Abdominal   Peds  Hematology negative hematology ROS (+)   Anesthesia Other Findings  Echo 2018: EF 60-65%, normal RV function, PASP 54, mild TR  Reproductive/Obstetrics                           Anesthesia Physical Anesthesia Plan  ASA: II  Anesthesia Plan: General   Post-op Pain Management:    Induction: Intravenous  PONV Risk Score and Plan: 1 and Ondansetron, Dexamethasone, Midazolam and Treatment may vary due to age or medical condition  Airway Management Planned: LMA  Additional Equipment: None  Intra-op Plan:   Post-operative Plan: Extubation in OR  Informed Consent: I have reviewed the patients History and Physical, chart, labs and discussed the procedure including the risks, benefits and alternatives for the proposed anesthesia with the patient or authorized representative who has indicated his/her understanding and acceptance.     Dental advisory given  Plan Discussed with:   Anesthesia Plan Comments:         Anesthesia Quick Evaluation

## 2020-05-14 DIAGNOSIS — R338 Other retention of urine: Secondary | ICD-10-CM | POA: Diagnosis not present

## 2020-05-14 DIAGNOSIS — N401 Enlarged prostate with lower urinary tract symptoms: Secondary | ICD-10-CM | POA: Diagnosis not present

## 2020-05-14 DIAGNOSIS — N210 Calculus in bladder: Secondary | ICD-10-CM | POA: Diagnosis not present

## 2020-05-14 DIAGNOSIS — I10 Essential (primary) hypertension: Secondary | ICD-10-CM | POA: Diagnosis not present

## 2020-05-14 DIAGNOSIS — Z79899 Other long term (current) drug therapy: Secondary | ICD-10-CM | POA: Diagnosis not present

## 2020-05-14 DIAGNOSIS — Z85030 Personal history of malignant carcinoid tumor of large intestine: Secondary | ICD-10-CM | POA: Diagnosis not present

## 2020-05-14 LAB — SURGICAL PATHOLOGY

## 2020-05-14 MED ORDER — PHENAZOPYRIDINE HCL 200 MG PO TABS
200.0000 mg | ORAL_TABLET | Freq: Three times a day (TID) | ORAL | 0 refills | Status: DC | PRN
Start: 2020-05-14 — End: 2020-06-28

## 2020-05-14 NOTE — Anesthesia Postprocedure Evaluation (Signed)
Anesthesia Post Note  Patient: Tommy Conley  Procedure(s) Performed: TRANSURETHRAL RESECTION OF THE PROSTATE (TURP) (N/A Bladder)     Patient location during evaluation: PACU Anesthesia Type: General Level of consciousness: awake and alert Pain management: pain level controlled Vital Signs Assessment: post-procedure vital signs reviewed and stable Respiratory status: spontaneous breathing, nonlabored ventilation and respiratory function stable Cardiovascular status: blood pressure returned to baseline and stable Postop Assessment: no apparent nausea or vomiting Anesthetic complications: no    Last Vitals:  Vitals:   05/14/20 0515 05/14/20 0829  BP: (!) 141/87 114/80  Pulse: 85 89  Resp: 16 16  Temp: 36.5 C 36.7 C  SpO2: 99% 99%    Last Pain:  Vitals:   05/14/20 0829  TempSrc:   PainSc: 0-No pain                 Lidia Collum

## 2020-05-14 NOTE — Discharge Summary (Signed)
Physician Discharge Summary      Patient ID: Tommy Conley MRN: [id number] DOB/AGE: 1951-11-09 69 y.o.  Admit date: 05/13/2020 Discharge date: 05/14/2020  Admission Diagnoses: BPH without urinary obstruction [N40.0]  Discharge Diagnoses:  Active Problems:   BPH without urinary obstruction   Discharged Condition: good  Hospital Course: Patient underwent an elective TURP for BPH with urinary retention.  Procedure was undertaken without complication and the patient was scheduled for overnight observation with continuous bladder irrigation.  He was seen the evening of his surgery and was noted to be doing well with no pain and very light pink urine on slow drip CBI.  Overnight he did well and his CBI was stopped and his Foley catheter was removed.  He has not voided yet but has no suprapubic tenderness.  He also reports he is having now and has not had since surgery any pain.  He is tolerating regular diet.  He is felt to be ready for discharge.  Discharge Exam: Blood pressure (!) 150/99, pulse 86, temperature 97.8 F (36.6 C), resp. rate 16, height 6\' 1"  (1.854 m), weight 91.9 kg, SpO2 100 %. General: Awake, alert and in no apparent distress. Chest: Normal respiratory effort. Cardiovascular: Regular rate and rhythm. Abdomen: Soft, nontender, nondistended.   Disposition: He will be discharged home with follow-up scheduled.   Allergies as of 05/14/2020      Reactions   Vasotec [enalapril] Other (See Comments)   cough      Medication List    TAKE these medications   amLODipine 10 MG tablet Commonly known as: NORVASC Take 1 tablet (10 mg total) by mouth daily.   bismuth subsalicylate [id number] suspension Commonly known as: PEPTO BISMOL Take 30 mLs by mouth every 6 (six) hours as needed.   metoprolol succinate 50 MG 24 hr tablet Commonly known as: TOPROL-XL TAKE 1 TABLET BY MOUTH EVERY DAY WITH OR IMMEDIATELY FOLLOWING A MEAL What changed: how much to take     potassium chloride SA 20 MEQ tablet Commonly known as: KLOR-CON TAKE 1 TABLET(20 MEQ) BY MOUTH TWICE DAILY What changed: See the new instructions.   tamsulosin 0.4 MG Caps capsule Commonly known as: FLOMAX TAKE 2 CAPSULES BY MOUTH AT BEDTIME      Follow-up Information    [facility] On 05/27/2020.   Why: For your appiontment at 8:15 Contact information: [address] [place]          Signed: Claybon Jabs 05/14/2020, 4:19 AM

## 2020-05-16 ENCOUNTER — Inpatient Hospital Stay: Payer: Medicare Other | Attending: Nurse Practitioner | Admitting: Oncology

## 2020-05-16 ENCOUNTER — Inpatient Hospital Stay: Payer: Medicare Other

## 2020-05-16 VITALS — BP 122/71 | HR 115 | Temp 97.5°F | Resp 18 | Wt 201.9 lb

## 2020-05-16 DIAGNOSIS — C7A021 Malignant carcinoid tumor of the cecum: Secondary | ICD-10-CM | POA: Insufficient documentation

## 2020-05-16 DIAGNOSIS — C7A098 Malignant carcinoid tumors of other sites: Secondary | ICD-10-CM

## 2020-05-16 DIAGNOSIS — C7B02 Secondary carcinoid tumors of liver: Secondary | ICD-10-CM | POA: Insufficient documentation

## 2020-05-16 LAB — CBC WITH DIFFERENTIAL/PLATELET
Abs Immature Granulocytes: 0.04 10*3/uL (ref 0.00–0.07)
Basophils Absolute: 0 10*3/uL (ref 0.0–0.1)
Basophils Relative: 0 %
Eosinophils Absolute: 0 10*3/uL (ref 0.0–0.5)
Eosinophils Relative: 0 %
HCT: 37.1 % — ABNORMAL LOW (ref 39.0–52.0)
Hemoglobin: 11.9 g/dL — ABNORMAL LOW (ref 13.0–17.0)
Immature Granulocytes: 0 %
Lymphocytes Relative: 16 %
Lymphs Abs: 2 10*3/uL (ref 0.7–4.0)
MCH: 28.6 pg (ref 26.0–34.0)
MCHC: 32.1 g/dL (ref 30.0–36.0)
MCV: 89.2 fL (ref 80.0–100.0)
Monocytes Absolute: 1.1 10*3/uL — ABNORMAL HIGH (ref 0.1–1.0)
Monocytes Relative: 9 %
Neutro Abs: 9.1 10*3/uL — ABNORMAL HIGH (ref 1.7–7.7)
Neutrophils Relative %: 75 %
Platelets: 152 10*3/uL (ref 150–400)
RBC: 4.16 MIL/uL — ABNORMAL LOW (ref 4.22–5.81)
RDW: 14.6 % (ref 11.5–15.5)
WBC: 12.2 10*3/uL — ABNORMAL HIGH (ref 4.0–10.5)
nRBC: 0 % (ref 0.0–0.2)

## 2020-05-16 LAB — CMP (CANCER CENTER ONLY)
ALT: 34 U/L (ref 0–44)
AST: 40 U/L (ref 15–41)
Albumin: 3.3 g/dL — ABNORMAL LOW (ref 3.5–5.0)
Alkaline Phosphatase: 155 U/L — ABNORMAL HIGH (ref 38–126)
Anion gap: 10 (ref 5–15)
BUN: 15 mg/dL (ref 8–23)
CO2: 27 mmol/L (ref 22–32)
Calcium: 8.8 mg/dL — ABNORMAL LOW (ref 8.9–10.3)
Chloride: 104 mmol/L (ref 98–111)
Creatinine: 1.03 mg/dL (ref 0.61–1.24)
GFR, Est AFR Am: >60 mL/min (ref >60)
GFR, Estimated: >60 mL/min (ref >60)
Glucose, Bld: 124 mg/dL — ABNORMAL HIGH (ref 70–99)
Potassium: 3.8 mmol/L (ref 3.5–5.1)
Sodium: 141 mmol/L (ref 135–145)
Total Bilirubin: 0.9 mg/dL (ref 0.3–1.2)
Total Protein: 6.4 g/dL — ABNORMAL LOW (ref 6.5–8.1)

## 2020-05-16 MED ORDER — OCTREOTIDE ACETATE 30 MG IM KIT
30.0000 mg | PACK | Freq: Once | INTRAMUSCULAR | Status: AC
  Administered 2020-05-16: 30 mg via INTRAMUSCULAR

## 2020-05-16 NOTE — Progress Notes (Signed)
Tommy OFFICE PROGRESS NOTE   Diagnosis: Carcinoid tumor  INTERVAL HISTORY:   Tommy Conley returns as scheduled.  He continues monthly Sandostatin.  No diarrhea.  He underwent a TURP on 05/13/2020.  He reports improvement in urination.  Hematuria has improved today.  Objective:  Vital signs in last 24 hours:  Blood pressure 122/71, pulse (!) 115, temperature (!) 97.5 F (36.4 C), temperature source Oral, resp. rate 18, weight 201 lb 14.4 oz (91.6 kg), SpO2 100 %.    Resp: End inspiratory rhonchi at the low posterior lateral chest bilaterally, no respiratory distress Cardio: Regular rate and rhythm, tachycardia GI: Fullness in the right upper abdomen without a discrete liver edge Vascular: No leg edema   Lab Results:  Lab Results  Component Value Date   WBC 12.2 (H) 05/16/2020   HGB 11.9 (L) 05/16/2020   HCT 37.1 (L) 05/16/2020   MCV 89.2 05/16/2020   PLT 152 05/16/2020   NEUTROABS 9.1 (H) 05/16/2020    CMP  Lab Results  Component Value Date   NA 141 05/16/2020   K 3.8 05/16/2020   CL 104 05/16/2020   CO2 27 05/16/2020   GLUCOSE 124 (H) 05/16/2020   BUN 15 05/16/2020   CREATININE 1.03 05/16/2020   CALCIUM 8.8 (L) 05/16/2020   PROT 6.4 (L) 05/16/2020   ALBUMIN 3.3 (L) 05/16/2020   AST 40 05/16/2020   ALT 34 05/16/2020   ALKPHOS 155 (H) 05/16/2020   BILITOT 0.9 05/16/2020   GFRNONAA >60 05/16/2020   GFRAA >60 05/16/2020    Medications: I have reviewed the patient's current medications.   Assessment/Plan: 1. 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  1. Status post a right colon/ileum resection on 09/29/2011.  2. Wedge biopsy of a right liver lesion on 09/29/2011 confirmed metastatic well-differentiated neuroendocrine carcinoma.  3. CT of the abdomen 08/25/2011 consistent with multiple liver metastases.  4. Elevated preoperative chromogranin A level and  a 24-hour urine 5-HIAA level. Stable on repeat 11/16/2011. 5. Restaging CT on 12/28/2011 confirmed slight progression of the metastatic liver lesions. 6. Status post 3 cycles of Xeloda/temozolomide with cycle 3 initiated on 03/12/2012 7. Restaging CT of the abdomen 04/04/2012 revealed slight progression of the metastatic liver lesions and transverse mesocolon adenopathy. 8. Status post Y-90 radioembolization of the right liver on 05/19/2012. 9. Status post Y-90 radioembolization of the left liver on 07/06/2012. 10. Chromogranin A level decreased on 08/29/2012, 24-hour urine 5 HIAA slightly increased on 09/05/2012 11. Restaging CT on 12/05/2012 with a mixed response in the liver 12. Restaging CT 08/11/2013-mild increase in the size of liver lesions. 13. Restaging CT 01/22/2014-stable to slightly improved liver lesions, mild increase in mesenteric adenopathy. Chromogranin A level stable. 14. Chromogranin A level stable 05/01/2014. 15. Chromogranin A higher on 08/02/2014 16. Restaging CT 11/06/2014 with interval enlargement of the enhancing lesions within the left and right hepatic lobe. 17. Initiation of monthly Sandostatin 12/24/2014 18. Status post Y-90 radioembolization of the right liver on 01/03/2015 19. Chromogranin A level stable 02/18/2015 20. Chromogranin A level improved 08/12/2015 21. Restaging CT 09/11/2015 with mild increase in the size of the dominant liver metastasis in the right hepatic lobe. Other smaller liver metastases showed no significant change. Stable mild lymphadenopathy in central small bowel mesentery and right cardiophrenic angle. Stable subcm right middle lobe pulmonary nodule. 22. Monthly Sandostatin continued 23. Gallium-DOTATATE01/25/2018-increased activity of a dominant right liver mass and left liver lesion. Increased tracer  accumulation in a mesenteric mass 24. Cycle 1Lutathera 03/30/2018 25. Cycle 2 Lutathera 05/25/2018 26. Cycle 3 Lutathera 07/20/2018 27. Cycle  4 Lutathera 09/14/2018 28. Restaging Netspot 10/18/2018-current scan very similar to pretherapy dotatate PET scan. No evidence of new or progressive neuroendocrine tumor. No enlarged tumor sites. 24. Netspot 04/05/2020-new FDG avid liver lesions, new bone metastases, enlarged prostate 2. History of diarrhea (3 to 4 times per day), predating surgery for 8 months. Decrease in daily bowel movements coinciding with discontinuation of Nexium. 3. Hypertension. 4. History of gastroesophageal reflux disease. No reflux symptoms since discontinuing Nexium. 5. Rectal and ascending colon polyps. He underwent a colonoscopy on 12/20/2012 with findings of a sessile polyp in the ascending colon and a sessile polyp in the rectum. Pathology showed hyperplastic polyps. Prior right colon resection noted with a normal appearing ileocolonic anastomosis. Small internal hemorrhoids noted. Repeat colonoscopy in 5 years. 6. History of Iron deficiency anemia 7. 08/12/2016 syncopal episode status post evaluation in the emergency department 8. Left-sided hearing loss July 2020 9. Urinary retention March 2021-followed at the urology center, TUR 05/13/2020      Disposition: Tommy Conley appears stable.  The Netspot scan in April confirmed progression of the metastatic carcinoid tumor.  I will refer him to Dr. Ilda Foil to consider repeat treatment with Lutathera.  He will continue monthly Sandostatin.  He underwent a TUR earlier this week and reports improvement in urination.  Tommy Conley will be scheduled for an office visit in 2 months.  Betsy Coder, MD  05/16/2020  2:19 PM

## 2020-05-17 LAB — CHROMOGRANIN A: Chromogranin A (ng/mL): 9072 ng/mL — ABNORMAL HIGH (ref 0.0–101.8)

## 2020-05-22 ENCOUNTER — Other Ambulatory Visit (HOSPITAL_COMMUNITY): Payer: Self-pay | Admitting: Oncology

## 2020-05-22 DIAGNOSIS — C7A021 Malignant carcinoid tumor of the cecum: Secondary | ICD-10-CM

## 2020-05-29 ENCOUNTER — Telehealth: Payer: Self-pay | Admitting: Family Medicine

## 2020-05-29 NOTE — Telephone Encounter (Signed)
Pt left a voicemail requesting a call back. (no reason left in message) [facility]

## 2020-06-03 ENCOUNTER — Ambulatory Visit (HOSPITAL_COMMUNITY): Admission: RE | Admit: 2020-06-03 | Payer: Medicare Other | Source: Ambulatory Visit

## 2020-06-03 ENCOUNTER — Telehealth: Payer: Self-pay | Admitting: *Deleted

## 2020-06-03 NOTE — Telephone Encounter (Signed)
Message from nuclear med at [facility] that patient was a "no show" for his consult today to retreat w/lutetheria.  Called and left VM for patient to return call regarding his plans on retreat or has he decided not to at this time?

## 2020-06-07 NOTE — Telephone Encounter (Signed)
Left VM for 2nd time requesting return call to let office know his plans regarding the luteheria tx.

## 2020-06-13 ENCOUNTER — Inpatient Hospital Stay: Payer: Medicare Other | Attending: Nurse Practitioner

## 2020-06-25 ENCOUNTER — Encounter (HOSPITAL_COMMUNITY): Payer: Self-pay | Admitting: Emergency Medicine

## 2020-06-25 ENCOUNTER — Emergency Department (HOSPITAL_COMMUNITY): Payer: Medicare Other

## 2020-06-25 ENCOUNTER — Inpatient Hospital Stay (HOSPITAL_COMMUNITY)
Admission: EM | Admit: 2020-06-25 | Discharge: 2020-06-28 | DRG: 682 | Disposition: A | Discharge status: Home Health | Payer: Medicare Other | Attending: Family Medicine | Admitting: Family Medicine

## 2020-06-25 DIAGNOSIS — R4182 Altered mental status, unspecified: Secondary | ICD-10-CM

## 2020-06-25 DIAGNOSIS — N400 Enlarged prostate without lower urinary tract symptoms: Secondary | ICD-10-CM | POA: Diagnosis present

## 2020-06-25 DIAGNOSIS — C787 Secondary malignant neoplasm of liver and intrahepatic bile duct: Secondary | ICD-10-CM | POA: Diagnosis present

## 2020-06-25 DIAGNOSIS — Z888 Allergy status to other drugs, medicaments and biological substances status: Secondary | ICD-10-CM

## 2020-06-25 DIAGNOSIS — G9341 Metabolic encephalopathy: Secondary | ICD-10-CM | POA: Diagnosis present

## 2020-06-25 DIAGNOSIS — N179 Acute kidney failure, unspecified: Principal | ICD-10-CM | POA: Diagnosis present

## 2020-06-25 DIAGNOSIS — K7682 Hepatic encephalopathy: Secondary | ICD-10-CM | POA: Diagnosis present

## 2020-06-25 DIAGNOSIS — E872 Acidosis: Secondary | ICD-10-CM | POA: Diagnosis present

## 2020-06-25 DIAGNOSIS — Z20822 Contact with and (suspected) exposure to covid-19: Secondary | ICD-10-CM | POA: Diagnosis present

## 2020-06-25 DIAGNOSIS — R531 Weakness: Secondary | ICD-10-CM

## 2020-06-25 DIAGNOSIS — K2270 Barrett's esophagus without dysplasia: Secondary | ICD-10-CM | POA: Diagnosis present

## 2020-06-25 DIAGNOSIS — I10 Essential (primary) hypertension: Secondary | ICD-10-CM | POA: Diagnosis present

## 2020-06-25 DIAGNOSIS — B961 Klebsiella pneumoniae [K. pneumoniae] as the cause of diseases classified elsewhere: Secondary | ICD-10-CM | POA: Diagnosis present

## 2020-06-25 DIAGNOSIS — C7951 Secondary malignant neoplasm of bone: Secondary | ICD-10-CM | POA: Diagnosis present

## 2020-06-25 DIAGNOSIS — Z8249 Family history of ischemic heart disease and other diseases of the circulatory system: Secondary | ICD-10-CM

## 2020-06-25 DIAGNOSIS — E876 Hypokalemia: Secondary | ICD-10-CM | POA: Diagnosis present

## 2020-06-25 DIAGNOSIS — C7A021 Malignant carcinoid tumor of the cecum: Secondary | ICD-10-CM | POA: Diagnosis present

## 2020-06-25 DIAGNOSIS — Z83438 Family history of other disorder of lipoprotein metabolism and other lipidemia: Secondary | ICD-10-CM

## 2020-06-25 DIAGNOSIS — N390 Urinary tract infection, site not specified: Secondary | ICD-10-CM | POA: Diagnosis present

## 2020-06-25 DIAGNOSIS — E860 Dehydration: Secondary | ICD-10-CM | POA: Diagnosis present

## 2020-06-25 DIAGNOSIS — F17210 Nicotine dependence, cigarettes, uncomplicated: Secondary | ICD-10-CM | POA: Diagnosis present

## 2020-06-25 DIAGNOSIS — C7A098 Malignant carcinoid tumors of other sites: Secondary | ICD-10-CM

## 2020-06-25 DIAGNOSIS — Z79899 Other long term (current) drug therapy: Secondary | ICD-10-CM

## 2020-06-25 DIAGNOSIS — E871 Hypo-osmolality and hyponatremia: Secondary | ICD-10-CM | POA: Diagnosis present

## 2020-06-25 DIAGNOSIS — I959 Hypotension, unspecified: Secondary | ICD-10-CM | POA: Diagnosis present

## 2020-06-25 LAB — CBC WITH DIFFERENTIAL/PLATELET
Abs Immature Granulocytes: 0.04 10*3/uL (ref 0.00–0.07)
Basophils Absolute: 0 10*3/uL (ref 0.0–0.1)
Basophils Relative: 0 %
Eosinophils Absolute: 0 10*3/uL (ref 0.0–0.5)
Eosinophils Relative: 1 %
HCT: 45.1 % (ref 39.0–52.0)
Hemoglobin: 14.6 g/dL (ref 13.0–17.0)
Immature Granulocytes: 1 %
Lymphocytes Relative: 19 %
Lymphs Abs: 1.5 10*3/uL (ref 0.7–4.0)
MCH: 29.4 pg (ref 26.0–34.0)
MCHC: 32.4 g/dL (ref 30.0–36.0)
MCV: 90.9 fL (ref 80.0–100.0)
Monocytes Absolute: 0.9 10*3/uL (ref 0.1–1.0)
Monocytes Relative: 11 %
Neutro Abs: 5.6 10*3/uL (ref 1.7–7.7)
Neutrophils Relative %: 68 %
Platelets: 180 10*3/uL (ref 150–400)
RBC: 4.96 MIL/uL (ref 4.22–5.81)
RDW: 15.8 % — ABNORMAL HIGH (ref 11.5–15.5)
WBC: 8.1 10*3/uL (ref 4.0–10.5)
nRBC: 0 % (ref 0.0–0.2)

## 2020-06-25 LAB — COMPREHENSIVE METABOLIC PANEL
ALT: 39 U/L (ref 0–44)
AST: 43 U/L — ABNORMAL HIGH (ref 15–41)
Albumin: 3.3 g/dL — ABNORMAL LOW (ref 3.5–5.0)
Alkaline Phosphatase: 252 U/L — ABNORMAL HIGH (ref 38–126)
Anion gap: 9 (ref 5–15)
BUN: 32 mg/dL — ABNORMAL HIGH (ref 8–23)
CO2: 18 mmol/L — ABNORMAL LOW (ref 22–32)
Calcium: 8.5 mg/dL — ABNORMAL LOW (ref 8.9–10.3)
Chloride: 105 mmol/L (ref 98–111)
Creatinine, Ser: 1.41 mg/dL — ABNORMAL HIGH (ref 0.61–1.24)
GFR calc Af Amer: 58 mL/min — ABNORMAL LOW (ref >60)
GFR calc non Af Amer: 50 mL/min — ABNORMAL LOW (ref >60)
Glucose, Bld: 115 mg/dL — ABNORMAL HIGH (ref 70–99)
Potassium: 3.4 mmol/L — ABNORMAL LOW (ref 3.5–5.1)
Sodium: 132 mmol/L — ABNORMAL LOW (ref 135–145)
Total Bilirubin: 1.1 mg/dL (ref 0.3–1.2)
Total Protein: 6.8 g/dL (ref 6.5–8.1)

## 2020-06-25 LAB — LACTIC ACID, PLASMA: Lactic Acid, Venous: 2.2 mmol/L (ref 0.5–1.9)

## 2020-06-25 LAB — TROPONIN I (HIGH SENSITIVITY): Troponin I (High Sensitivity): 5 ng/L (ref <18)

## 2020-06-25 LAB — AMMONIA: Ammonia: 29 umol/L (ref 9–35)

## 2020-06-25 LAB — LIPASE, BLOOD: Lipase: 17 U/L (ref 11–51)

## 2020-06-25 MED ORDER — IOHEXOL 300 MG/ML  SOLN
100.0000 mL | Freq: Once | INTRAMUSCULAR | Status: AC | PRN
  Administered 2020-06-26: 100 mL via INTRAVENOUS

## 2020-06-25 MED ORDER — SODIUM CHLORIDE 0.9 % IV BOLUS
1000.0000 mL | Freq: Once | INTRAVENOUS | Status: AC
  Administered 2020-06-25: 1000 mL via INTRAVENOUS

## 2020-06-25 NOTE — ED Provider Notes (Signed)
  Provider Note MRN:  [id number]  Arrival date & time: 06/26/20    ED Course and Medical Decision Making  Assumed care from Dr. Melina Copa at shift change.  History of carcinoid tumor not currently on any specific treatment, also recent TURP procedure and has been not himself ever since.  Altered mental status, will need admission, awaiting CT imaging.  CT imaging is without acute process, admitted to hospital service for AKI, dehydration, altered mental status.  Procedures  Final Clinical Impressions(s) / ED Diagnoses     ICD-10-CM   1. Dehydration  E86.0   2. AKI (acute kidney injury) ([place])  N17.9   3. Altered mental status, unspecified altered mental status type  R41.82     ED Discharge Orders    None      Discharge Instructions   None     Barth Kirks. Sedonia Small, [place] [email or web address]    Maudie Flakes, MD 06/26/20 [phone]

## 2020-06-25 NOTE — ED Provider Notes (Signed)
[facility] EMERGENCY DEPARTMENT Provider Note   CSN: [id number] Arrival date & time: 06/25/20  2004     History Chief Complaint  Patient presents with  . Hypotension    Tommy Conley is a 69 y.o. male.  Patient is a very poor historian.  He does not know why he is here.  Daughter states that he has been declining since his prostate surgery about 6 weeks ago.  Not eating much.  Urines been dark.  Has been very lethargic.  No known fever.  Patient himself denies any complaints.  Barely opens his eyes during interview.  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  The history is provided by the patient and a relative. The history is limited by the condition of the patient.  Weakness Severity:  Moderate Onset quality:  Gradual Timing:  Constant Progression:  Worsening Chronicity:  New Context: dehydration and urinary tract infection (?)   Relieved by:  Nothing Worsened by:  Activity Ineffective treatments:  Drinking fluids Associated symptoms: lethargy   Associated symptoms: no abdominal pain, no chest pain, no cough, no diarrhea, no dysuria, no fever, no foul-smelling urine, no headaches, no shortness of breath and no vomiting        Past Medical History:  Diagnosis Date  . Allergy   . Barrett's esophagus   . BPH (benign prostatic hyperplasia)   . Carcinoid tumor of cecum   . Colon polyp   . Complication of anesthesia 2010   woke up during radiation pellet insertion  . Foley catheter in place last 3- 4 weeks  . GERD (gastroesophageal reflux disease)   . Heart murmur    "had all my left" no cardiologist currently had checked 3-4 times in past  . Hypertension   . IBS (irritable bowel syndrome)   . Malignant carcinoid tumors of other sites [facility]) 08/25/2016   colon  . Perennial allergic rhinitis     Patient Active Problem List   Diagnosis Date Noted  . BPH without urinary obstruction 05/13/2020  . Malignant carcinoid tumors of other sites ([place])  08/25/2016  . Rectal pain 05/05/2016  . Irritable bowel syndrome 04/29/2015  . Essential hypertension, benign 04/24/2013  . Erectile dysfunction 04/24/2013  . Esophageal reflux 04/24/2013  . Malignant carcinoid tumor of cecum ([place]) 10/13/2011    Past Surgical History:  Procedure Laterality Date  . colectomy  09/29/11   lap - right with liver bx  . COLON SURGERY  2017  . COLONOSCOPY  07/2011  . radioembolization right liver  2016  . TRANSURETHRAL RESECTION OF PROSTATE N/A 05/13/2020   Procedure: TRANSURETHRAL RESECTION OF THE PROSTATE (TURP);  Surgeon: Kathie Rhodes, MD;  Location: [facility];  Service: Urology;  Laterality: N/A;  . UPPER GASTROINTESTINAL ENDOSCOPY     hx barrett's esophagus  . WEDGE LIVER BIOPSY  09/29/11   right liver lesion       Family History  Problem Relation Age of Onset  . Hypertension Father   . Hyperlipidemia Father   . Heart attack Father   . Colon polyps Neg Hx   . Colon cancer Neg Hx   . Stomach cancer Neg Hx   . Cancer Neg Hx   . Esophageal cancer Neg Hx   . Pancreatic cancer Neg Hx   . Rectal cancer Neg Hx     Social History   Tobacco Use  . Smoking status: Light Tobacco Smoker    Packs/day: 0.25  Years: 17.00    Pack years: 4.25    Types: Cigarettes  . Smokeless tobacco: Never Used  . Tobacco comment: does not smoke every day  Vaping Use  . Vaping Use: Never used  Substance Use Topics  . Alcohol use: No  . Drug use: No    Home Medications Prior to Admission medications   Medication Sig Start Date End Date Taking? Authorizing Provider  amLODipine (NORVASC) 10 MG tablet Take 1 tablet (10 mg total) by mouth daily. 04/25/20   Lovena Le, Malena M, DO  bismuth subsalicylate (PEPTO BISMOL) 262 MG/15ML suspension Take 30 mLs by mouth every 6 (six) hours as needed.    [provider]  metoprolol succinate (TOPROL-XL) 50 MG 24 hr tablet TAKE 1 TABLET BY MOUTH EVERY DAY WITH OR IMMEDIATELY FOLLOWING A MEAL Patient  taking differently: 50 mg. TAKE 1 TABLET BY MOUTH EVERY DAY WITH OR IMMEDIATELY FOLLOWING A MEAL 02/27/20   Mikey Kirschner, MD  phenazopyridine (PYRIDIUM) 200 MG tablet Take 1 tablet (200 mg total) by mouth 3 (three) times daily as needed for pain. 05/14/20   Kathie Rhodes, MD  potassium chloride SA (KLOR-CON) 20 MEQ tablet TAKE 1 TABLET(20 MEQ) BY MOUTH TWICE DAILY Patient taking differently: 20 mEq at bedtime.  11/27/19   Ladell Pier, MD    Allergies    Vasotec [enalapril]  Review of Systems   Review of Systems  Constitutional: Negative for fever.  HENT: Negative for sore throat.   Eyes: Negative for visual disturbance.  Respiratory: Negative for cough and shortness of breath.   Cardiovascular: Negative for chest pain.  Gastrointestinal: Negative for abdominal pain, diarrhea and vomiting.  Genitourinary: Negative for dysuria.  Musculoskeletal: Negative for back pain.  Skin: Negative for rash.  Neurological: Positive for weakness. Negative for headaches.    Physical Exam Updated Vital Signs BP 112/66 (BP Location: Right Arm)   Pulse (!) 110   Temp 97.8 F (36.6 C) (Oral)   Resp 20   Ht 6' 1" (1.854 m)   Wt 95.3 kg   SpO2 100%   BMI 27.71 kg/m   Physical Exam Vitals and nursing note reviewed.  Constitutional:      Appearance: He is well-developed.  HENT:     Head: Normocephalic and atraumatic.  Eyes:     Conjunctiva/sclera: Conjunctivae normal.  Cardiovascular:     Rate and Rhythm: Normal rate and regular rhythm.     Heart sounds: No murmur heard.   Pulmonary:     Effort: Pulmonary effort is normal. No respiratory distress.     Breath sounds: Normal breath sounds.  Abdominal:     Palpations: Abdomen is soft.     Tenderness: There is no abdominal tenderness.  Musculoskeletal:        General: No deformity or signs of injury. Normal range of motion.     Cervical back: Neck supple.  Skin:    General: Skin is warm and dry.     Capillary Refill: Capillary  refill takes less than 2 seconds.  Neurological:     General: No focal deficit present.     Mental Status: He is lethargic and disoriented.     Comments: He thinks its may and Monday and not July and Tuesday.     ED Results / Procedures / Treatments   Labs (all labs ordered are listed, but only abnormal results are displayed) Labs Reviewed  COMPREHENSIVE METABOLIC PANEL - Abnormal; Notable for the following components:  Result Value   Sodium 132 (*)    Potassium 3.4 (*)    CO2 18 (*)    Glucose, Bld 115 (*)    BUN 32 (*)    Creatinine, Ser 1.41 (*)    Calcium 8.5 (*)    Albumin 3.3 (*)    AST 43 (*)    Alkaline Phosphatase 252 (*)    GFR calc non Af Amer 50 (*)    GFR calc Af Amer 58 (*)    All other components within normal limits  LACTIC ACID, PLASMA - Abnormal; Notable for the following components:   Lactic Acid, Venous 2.2 (*)    All other components within normal limits  LACTIC ACID, PLASMA - Abnormal; Notable for the following components:   Lactic Acid, Venous 2.1 (*)    All other components within normal limits  URINALYSIS, ROUTINE W REFLEX MICROSCOPIC - Abnormal; Notable for the following components:   Color, Urine BROWN (*)    APPearance TURBID (*)    Glucose, UA   (*)    Value: TEST NOT REPORTED DUE TO COLOR INTERFERENCE OF URINE PIGMENT   Hgb urine dipstick   (*)    Value: TEST NOT REPORTED DUE TO COLOR INTERFERENCE OF URINE PIGMENT   Bilirubin Urine   (*)    Value: TEST NOT REPORTED DUE TO COLOR INTERFERENCE OF URINE PIGMENT   Ketones, ur   (*)    Value: TEST NOT REPORTED DUE TO COLOR INTERFERENCE OF URINE PIGMENT   Protein, ur   (*)    Value: TEST NOT REPORTED DUE TO COLOR INTERFERENCE OF URINE PIGMENT   Nitrite   (*)    Value: TEST NOT REPORTED DUE TO COLOR INTERFERENCE OF URINE PIGMENT   Leukocytes,Ua   (*)    Value: TEST NOT REPORTED DUE TO COLOR INTERFERENCE OF URINE PIGMENT   All other components within normal limits  CBC WITH  DIFFERENTIAL/PLATELET - Abnormal; Notable for the following components:   RDW 15.8 (*)    All other components within normal limits  URINALYSIS, MICROSCOPIC (REFLEX) - Abnormal; Notable for the following components:   Bacteria, UA RARE (*)    All other components within normal limits  ALBUMIN - Abnormal; Notable for the following components:   Albumin 2.8 (*)    All other components within normal limits  CULTURE, BLOOD (ROUTINE X 2)  CULTURE, BLOOD (ROUTINE X 2)  SARS CORONAVIRUS 2 BY RT PCR (HOSPITAL ORDER, [facility])  URINE CULTURE  LIPASE, BLOOD  AMMONIA  LACTIC ACID, PLASMA  LACTIC ACID, PLASMA  CBC WITH DIFFERENTIAL/PLATELET  HIV ANTIBODY (ROUTINE TESTING W REFLEX)  TROPONIN I (HIGH SENSITIVITY)  TROPONIN I (HIGH SENSITIVITY)    EKG EKG Interpretation  Date/Time:  Tuesday June 25 2020 20:25:55 EDT Ventricular Rate:  104 PR Interval:  166 QRS Duration: 84 QT Interval:  318 QTC Calculation: 418 R Axis:   79 Text Interpretation: Sinus tachycardia T wave abnormality, consider inferior ischemia Abnormal ECG No significant change since 8/17 Confirmed by Aletta Edouard [phone]) on 06/25/2020 8:45:53 PM   Radiology CT Head Wo Contrast  Result Date: 06/26/2020 CLINICAL DATA:  Hypotension and lethargy EXAM: CT HEAD WITHOUT CONTRAST TECHNIQUE: Contiguous axial images were obtained from the base of the skull through the vertex without intravenous contrast. COMPARISON:  None. FINDINGS: Brain: Diffuse atrophic changes are noted. Findings of chronic white matter ischemic change are seen. No findings to suggest acute hemorrhage, acute infarction or space-occupying mass lesion  are noted. Vascular: No hyperdense vessel or unexpected calcification. Skull: Normal. Negative for fracture or focal lesion. Sinuses/Orbits: No acute finding. Other: None. IMPRESSION: Chronic atrophic and ischemic changes without acute abnormality. Electronically Signed   By: Inez Catalina  M.D.   On: 06/26/2020 01:03   CT Abdomen Pelvis W Contrast  Result Date: 06/26/2020 CLINICAL DATA:  Known metastatic cecal carcinoma with abdominal distension EXAM: CT ABDOMEN AND PELVIS WITH CONTRAST TECHNIQUE: Multidetector CT imaging of the abdomen and pelvis was performed using the standard protocol following bolus administration of intravenous contrast. CONTRAST:  150m OMNIPAQUE IOHEXOL 300 MG/ML  SOLN COMPARISON:  04/05/2020 PET-CT FINDINGS: Lower chest: No acute abnormality. Hepatobiliary: Multiple enhancing lesions are noted throughout the liver consistent with metastatic disease. Largest of these measures 16 cm in dimension. Gallbladder is within normal limits. Changes of prior embolotherapy in the gastro duodenal artery are seen. Pancreas: Unremarkable. No pancreatic ductal dilatation or surrounding inflammatory changes. Spleen: Normal in size without focal abnormality. Adrenals/Urinary Tract: Adrenal glands are within normal limits. Kidneys demonstrate a normal enhancement pattern bilaterally. Normal excretion is noted from the kidneys bilaterally. The bladder is decompressed Stomach/Bowel: Changes of prior right colonic surgery are noted. Ileocolic anastomosis is noted without obstructive change. Vascular/Lymphatic: Aortic atherosclerosis. No enlarged abdominal or pelvic lymph nodes. Reproductive: Prostate is mildly prominent indenting upon the bladder. This is stable from prior PET-CT. Other: No abdominal wall hernia or abnormality. No abdominopelvic ascites. Musculoskeletal: Increased sclerosis is noted within the sacrum which corresponds to a metastatic lesion seen on prior PET-CT. Some scattered sclerotic lesions are noted within the spine consistent with metastatic disease. IMPRESSION: Changes consistent with diffuse hepatic metastatic disease similar to that seen on prior PET-CT. Bony metastatic disease is noted. No acute abnormality is noted correspond with the patient's given clinical  history. Electronically Signed   By: MInez CatalinaM.D.   On: 06/26/2020 01:58   DG Chest Port 1 View  Result Date: 06/25/2020 CLINICAL DATA:  Weakness EXAM: PORTABLE CHEST 1 VIEW COMPARISON:  09/23/2011 FINDINGS: Cardiac shadow is at the upper limits of normal in size. The lungs are well aerated bilaterally. No focal infiltrate is seen. No bony abnormality is noted. IMPRESSION: No acute abnormality noted. Electronically Signed   By: MInez CatalinaM.D.   On: 06/25/2020 22:00    Procedures Procedures (including critical care time)  Medications Ordered in ED Medications  sodium chloride 0.9 % bolus 1,000 mL (has no administration in time range)    ED Course  I have reviewed the triage vital signs and the nursing notes.  Pertinent labs & imaging results that were available during my care of the patient were reviewed by me and considered in my medical decision making (see chart for details).  Clinical Course as of Jun 26 1013  Tue Jun 25, 2020  2149 Chest x-ray interpreted by me as no gross infiltrates.   [MB]  2333 Patient's labs showing elevated lactic acid of 2.2.  Do not think this necessarily represents infection as he has no fever.  Creatinine up from baseline and low bicarb likely reflecting some volume depletion.  Alk phos elevated but known metastatic disease.  We will put him in for CT head and abdomen and pelvis.  IV fluids infusing.  Signed to Dr. BSedonia Smallto follow-up on rest of imaging and likely will need admission to the hospital.   [MB]    Clinical Course User Index [MB] BHayden Rasmussen MD   MDM Rules/Calculators/A&P  This patient complains of generalized weakness and dark urine; this involves an extensive number of treatment Options and is a complaint that carries with it a high risk of complications and Morbidity. The differential includes infection, renal failure, dehydration, worsening metastatic disease, sepsis, metabolic derangement  I  ordered, reviewed and interpreted labs, which included CBC with normal white count normal hemoglobin, chemistries with low bicarb and increased creatinine consistent with some dehydration, low albumin, elevated alk phos seen in prior likely due to his metastatic disease, normal ammonia I ordered medication IV fluids I ordered imaging studies which included chest x-ray CT head CT abdomen and pelvis and I independently    visualized and interpreted imaging which showed evidence of metastatic disease in abdomen Previous records obtained and reviewed in epic, last saw Dr. Ammie Dalton over a month ago and was to be referred to another oncologist for possible continued treatment.  After the interventions stated above, I reevaluated the patient and found patient to remain with soft blood pressures but otherwise asymptomatic. Continues to be lethargic. Signed out to oncoming ED provider Dr. Sedonia Small to follow-up on imaging and likely admit to hospital for further management.   Final Clinical Impression(s) / ED Diagnoses Final diagnoses:  Dehydration  AKI (acute kidney injury) ([place])  Altered mental status, unspecified altered mental status type    Rx / DC Orders ED Discharge Orders    None       Hayden Rasmussen, MD 06/26/20 1019

## 2020-06-25 NOTE — ED Notes (Signed)
Date and time results received: 06/25/20 2243 (use smartphrase ".now" to insert current time)  Test: lactic acid Critical Value: 2.2  Name of Provider Notified: dr Melina Copa  Orders Received? Or Actions Taken?: Actions Taken: no orders received

## 2020-06-25 NOTE — ED Triage Notes (Addendum)
Pt c/o hypotension for the past couple of days. EMS stated he had transurethral prostate resection for urinary retention on 05/14/2020 and has been lethargic since per the daughter.

## 2020-06-26 ENCOUNTER — Emergency Department (HOSPITAL_COMMUNITY): Payer: Medicare Other

## 2020-06-26 ENCOUNTER — Other Ambulatory Visit: Payer: Self-pay

## 2020-06-26 DIAGNOSIS — E860 Dehydration: Secondary | ICD-10-CM | POA: Diagnosis not present

## 2020-06-26 DIAGNOSIS — N179 Acute kidney failure, unspecified: Secondary | ICD-10-CM | POA: Diagnosis not present

## 2020-06-26 DIAGNOSIS — R531 Weakness: Secondary | ICD-10-CM | POA: Diagnosis not present

## 2020-06-26 DIAGNOSIS — C7A021 Malignant carcinoid tumor of the cecum: Secondary | ICD-10-CM | POA: Diagnosis not present

## 2020-06-26 LAB — URINALYSIS, ROUTINE W REFLEX MICROSCOPIC

## 2020-06-26 LAB — ALBUMIN: Albumin: 2.8 g/dL — ABNORMAL LOW (ref 3.5–5.0)

## 2020-06-26 LAB — TROPONIN I (HIGH SENSITIVITY): Troponin I (High Sensitivity): 4 ng/L (ref <18)

## 2020-06-26 LAB — URINALYSIS, MICROSCOPIC (REFLEX)
RBC / HPF: >50 RBC/hpf (ref 0–5)
WBC, UA: >50 WBC/hpf (ref 0–5)

## 2020-06-26 LAB — HIV ANTIBODY (ROUTINE TESTING W REFLEX): HIV Screen 4th Generation wRfx: NONREACTIVE

## 2020-06-26 LAB — LACTIC ACID, PLASMA
Lactic Acid, Venous: 2.1 mmol/L (ref 0.5–1.9)
Lactic Acid, Venous: 1.6 mmol/L (ref 0.5–1.9)
Lactic Acid, Venous: 1.7 mmol/L (ref 0.5–1.9)

## 2020-06-26 LAB — SARS CORONAVIRUS 2 BY RT PCR (HOSPITAL ORDER, PERFORMED IN ~~LOC~~ HOSPITAL LAB): SARS Coronavirus 2: NEGATIVE

## 2020-06-26 MED ORDER — HEPARIN SODIUM (PORCINE) 5000 UNIT/ML IJ SOLN
5000.0000 [IU] | Freq: Three times a day (TID) | INTRAMUSCULAR | Status: DC
  Administered 2020-06-26 – 2020-06-28 (×7): 5000 [IU] via SUBCUTANEOUS
  Filled 2020-06-26 (×7): qty 1

## 2020-06-26 MED ORDER — SODIUM CHLORIDE 0.9 % IV SOLN
1.0000 g | INTRAVENOUS | Status: DC
  Administered 2020-06-26 – 2020-06-28 (×3): 1 g via INTRAVENOUS
  Filled 2020-06-26 (×3): qty 10

## 2020-06-26 MED ORDER — SODIUM CHLORIDE 0.9 % IV SOLN: Freq: Once | INTRAVENOUS | Status: AC

## 2020-06-26 MED ORDER — POTASSIUM CHLORIDE CRYS ER 20 MEQ PO TBCR
20.0000 meq | EXTENDED_RELEASE_TABLET | Freq: Two times a day (BID) | ORAL | Status: DC
  Administered 2020-06-26 – 2020-06-28 (×5): 20 meq via ORAL
  Filled 2020-06-26: qty 1
  Filled 2020-06-26: qty 2
  Filled 2020-06-26 (×3): qty 1

## 2020-06-26 MED ORDER — ENSURE ENLIVE PO LIQD
237.0000 mL | Freq: Two times a day (BID) | ORAL | Status: DC
  Administered 2020-06-26 – 2020-06-28 (×6): 237 mL via ORAL
  Filled 2020-06-26 (×6): qty 237

## 2020-06-26 NOTE — Evaluation (Signed)
Physical Therapy Evaluation Patient Details Name: Tommy Conley MRN: [id number] DOB: May 31, 1951 Today's Date: 06/26/2020   History of Present Illness  Tommy Conley is a 69 y.o. male with medical history significant for hypertension, BPH, malignant carcinoid tumor of the cecum who presents to the emergency department due to generalized weakness.  Patient was unable to provide history, history was obtained from ED physician and ED chart.  Per report, it was reported to have started to decline since he had prostate surgery about 6 weeks ago by daughter, he has not been eating much and appears dehydrated with increased darkening of urine.  At bedside, patient denies any discomfort, pain, headache, fever, chills.  He was alert and oriented x3 (though initially he thought it was 2012, but he quickly corrected himself and said that it was 2021), but was unable to tell me why he came to the hospital.    Clinical Impression  Patient demonstrates good return for sitting up at bedside, has to lean on nearby objects for support during sit to stands and transfers, good return for transferring to commode in bathroom without loss of balance and declined ambulating out of room due to c/o fatigue.  Patient tolerated sitting up in chair after therapy - RN notified.  Patient will benefit from continued physical therapy in hospital and recommended venue below to increase strength, balance, endurance for safe ADLs and gait.     Follow Up Recommendations Home health PT;Supervision - Intermittent    Equipment Recommendations  Rolling walker with 5" wheels    Recommendations for Other Services       Precautions / Restrictions Precautions Precautions: Fall Restrictions Weight Bearing Restrictions: No      Mobility  Bed Mobility Overal bed mobility: Modified Independent                Transfers Overall transfer level: Needs assistance Equipment used: 1 person hand held assist Transfers: Sit  to/from Stand;Stand Pivot Transfers Sit to Stand: Min guard Stand pivot transfers: Min guard       General transfer comment: labored movement, frequent leaning on nearby objects for support  Ambulation/Gait Ambulation/Gait assistance: Min guard Gait Distance (Feet): 15 Feet Assistive device: IV Pole;None;1 person hand held assist Gait Pattern/deviations: Decreased step length - right;Decreased step length - left;Decreased stride length Gait velocity: decreased   General Gait Details: has to lean on nearby objects for support or hold onto IV pole without loss of balance, limited secondary to c/o fatigue  Stairs            Wheelchair Mobility    Modified Rankin (Stroke Patients Only)       Balance Overall balance assessment: Needs assistance Sitting-balance support: Feet supported;No upper extremity supported Sitting balance-Leahy Scale: Good Sitting balance - Comments: seated at EOB   Standing balance support: During functional activity;No upper extremity supported Standing balance-Leahy Scale: Poor Standing balance comment: fair/poor leaning on nearby objects for support                             Pertinent Vitals/Pain Pain Assessment: No/denies pain    Home Living Family/patient expects to be discharged to:: Private residence Living Arrangements: Alone Available Help at Discharge: Family;Available PRN/intermittently Type of Home: Apartment Home Access: Level entry     Home Layout: One level Home Equipment: Cane - single point      Prior Function Level of Independence: Independent  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     Hand Dominance        Extremity/Trunk Assessment   Upper Extremity Assessment Upper Extremity Assessment: Generalized weakness    Lower Extremity Assessment Lower Extremity Assessment: Generalized weakness    Cervical / Trunk Assessment Cervical / Trunk Assessment: Normal  Communication    Communication: No difficulties  Cognition Arousal/Alertness: Awake/alert Behavior During Therapy: WFL for tasks assessed/performed Overall Cognitive Status: Within Functional Limits for tasks assessed                                        General Comments      Exercises     Assessment/Plan    PT Assessment Patient needs continued PT services  PT Problem List Decreased strength;Decreased activity tolerance;Decreased balance;Decreased mobility       PT Treatment Interventions Balance training;Gait training;Stair training;Functional mobility training;Therapeutic activities;Patient/family education;Therapeutic exercise    PT Goals (Current goals can be found in the Care Plan section)  Acute Rehab PT Goals Patient Stated Goal: return home PT Goal Formulation: With patient Time For Goal Achievement: 07/01/20 Potential to Achieve Goals: Good    Frequency Min 3X/week   Barriers to discharge        Co-evaluation               AM-PAC PT "6 Clicks" Mobility  Outcome Measure Help needed turning from your back to your side while in a flat bed without using bedrails?: None Help needed moving from lying on your back to sitting on the side of a flat bed without using bedrails?: None Help needed moving to and from a bed to a chair (including a wheelchair)?: A Little Help needed standing up from a chair using your arms (e.g., wheelchair or bedside chair)?: A Little Help needed to walk in hospital room?: A Little Help needed climbing 3-5 steps with a railing? : A Lot 6 Click Score: 19    End of Session   Activity Tolerance: Patient tolerated treatment well;Patient limited by fatigue Patient left: in chair;with call bell/phone within reach;with chair alarm set Nurse Communication: Mobility status PT Visit Diagnosis: Unsteadiness on feet (R26.81);Other abnormalities of gait and mobility (R26.89);Muscle weakness (generalized) (M62.81)    Time: 3557-3220 PT  Time Calculation (min) (ACUTE ONLY): 24 min   Charges:   PT Evaluation $PT Eval Moderate Complexity: 1 Mod PT Treatments $Therapeutic Activity: 23-37 mins        12:22 PM, 06/26/20 Lonell Grandchild, MPT Physical Therapist with [facility] [phone] office [phone] mobile phone

## 2020-06-26 NOTE — Progress Notes (Signed)
Message left for patient's daughter, [name], requesting return contact regarding discharge plan/obs status.    Montague Corella, Clydene Pugh, LCSW

## 2020-06-26 NOTE — Progress Notes (Signed)
Patient seen and evaluated, chart reviewed, please see EMR for updated orders. Please see full H&P dictated by admitting physician Dr. Josephine Cables for same date of service.  --Plan of care discussed with patient and daughter [name]  at bedside   --Patient will need oncology follow-up with Dr. Rhoderick Moody, MD

## 2020-06-26 NOTE — H&P (Signed)
History and Physical  Tommy Conley ASN:[id number] DOB: [birth year] DOA: 06/25/2020  Referring physician: Aletta Edouard, MD PCP: Erven Colla, DO  Patient coming from: Home  Chief Complaint: Generalized weakness  HPI: Tommy Conley is a 69 y.o. male with medical history significant for hypertension, BPH, malignant carcinoid tumor of the cecum who presents to the emergency department due to generalized weakness.  Patient was unable to provide history, history was obtained from ED physician and ED chart.  Per report, it was reported to have started to decline since he had prostate surgery about 6 weeks ago by daughter, he has not been eating much and appears dehydrated with increased darkening of urine.  At bedside, patient denies any discomfort, pain, headache, fever, chills.  He was alert and oriented x3 (though initially he thought it was 2012, but he quickly corrected himself and said that it was 2021), but was unable to tell me why he came to the hospital.  ED Course:  In the emergency department, he was hemodynamically stable.  Work-up in the ED showed normal CBC, hyponatremia, hypokalemia, BUN to creatinine 32/1.41 (baseline creatinine 1.0-1.14).  Lactic acid 2.2>2.1.  AST 43.  CT abdomen pelvis showed changes consistent with diffuse hepatic metastatic disease similar to that seen on prior PET-CT.  CT of head without contrast and chest x-ray showed no acute abnormality.  IV hydration with 1 L of NS was given.  Hospitalist was asked to admit him for further evaluation management.   Review of Systems: Constitutional: Negative for chills and fever.  HENT: Negative for ear pain and sore throat.   Eyes: Negative for pain and visual disturbance.  Respiratory: Negative for cough, chest tightness and shortness of breath.   Cardiovascular: Negative for chest pain and palpitations.  Gastrointestinal: Negative for abdominal pain and vomiting.  Endocrine: Negative for polyphagia and  polyuria.  Genitourinary: Negative for decreased urine volume, dysuria, enuresis, hematuria Musculoskeletal: Negative for arthralgias and back pain.  Skin: Negative for color change and rash.  Allergic/Immunologic: Negative for immunocompromised state.  Neurological: Positive for weakness.  Negative for tremors, syncope, speech difficulty, light-headedness and headaches.  Hematological: Does not bruise/bleed easily.  All other systems reviewed and are negative   Past Medical History:  Diagnosis Date  . Allergy   . Barrett's esophagus   . BPH (benign prostatic hyperplasia)   . Carcinoid tumor of cecum   . Colon polyp   . Complication of anesthesia 2010   woke up during radiation pellet insertion  . Foley catheter in place last 3- 4 weeks  . GERD (gastroesophageal reflux disease)   . Heart murmur    "had all my left" no cardiologist currently had checked 3-4 times in past  . Hypertension   . IBS (irritable bowel syndrome)   . Malignant carcinoid tumors of other sites [facility]) 08/25/2016   colon  . Perennial allergic rhinitis    Past Surgical History:  Procedure Laterality Date  . colectomy  09/29/11   lap - right with liver bx  . COLON SURGERY  2017  . COLONOSCOPY  07/2011  . radioembolization right liver  2016  . TRANSURETHRAL RESECTION OF PROSTATE N/A 05/13/2020   Procedure: TRANSURETHRAL RESECTION OF THE PROSTATE (TURP);  Surgeon: Kathie Rhodes, MD;  Location: [facility];  Service: Urology;  Laterality: N/A;  . UPPER GASTROINTESTINAL ENDOSCOPY     hx barrett's esophagus  . WEDGE LIVER BIOPSY  09/29/11   right liver lesion    Social History:  reports that he has been smoking cigarettes. He has a 4.25 pack-year smoking history. He has never used smokeless tobacco. He reports that he does not drink alcohol and does not use drugs.   Allergies  Allergen Reactions  . Vasotec [Enalapril] Cough    Family History  Problem Relation Age of Onset  . Hypertension  Father   . Hyperlipidemia Father   . Heart attack Father   . Colon polyps Neg Hx   . Colon cancer Neg Hx   . Stomach cancer Neg Hx   . Cancer Neg Hx   . Esophageal cancer Neg Hx   . Pancreatic cancer Neg Hx   . Rectal cancer Neg Hx     Prior to Admission medications   Medication Sig Start Date End Date Taking? Authorizing Provider  potassium chloride SA (KLOR-CON) 20 MEQ tablet TAKE 1 TABLET(20 MEQ) BY MOUTH TWICE DAILY Patient taking differently: Take 20 mEq by mouth 2 (two) times daily.  11/27/19  Yes Ladell Pier, MD  tamsulosin (FLOMAX) 0.4 MG CAPS capsule Take 0.4 mg by mouth at bedtime.   Yes [provider]  amLODipine (NORVASC) 10 MG tablet Take 1 tablet (10 mg total) by mouth daily. Patient not taking: Reported on 06/25/2020 04/25/20   Elvia Collum M, DO  metoprolol succinate (TOPROL-XL) 50 MG 24 hr tablet TAKE 1 TABLET BY MOUTH EVERY DAY WITH OR IMMEDIATELY FOLLOWING A MEAL Patient not taking: Reported on 06/25/2020 02/27/20   Mikey Kirschner, MD  phenazopyridine (PYRIDIUM) 200 MG tablet Take 1 tablet (200 mg total) by mouth 3 (three) times daily as needed for pain. Patient not taking: Reported on 06/25/2020 05/14/20   Kathie Rhodes, MD    Physical Exam: BP 114/89   Pulse 84   Temp 97.8 F (36.6 C) (Oral)   Resp 17   Ht 6\' 1"  (1.854 m)   Wt 95.3 kg   SpO2 98%   BMI 27.71 kg/m   . General: 69 y.o. year-old male well developed well nourished in no acute distress.  Alert and oriented x3. [name] HEENT: Dry mucous membrane.  NCAT, EOMI . Neck: Supple, trachea medial . Cardiovascular: Regular rate and rhythm with no rubs or gallops.  No thyromegaly or JVD noted.  No lower extremity edema. 2/4 pulses in all 4 extremities. [name] Respiratory: Clear to auscultation with no wheezes or rales. Good inspiratory effort. . Abdomen: Soft nontender nondistended with normal bowel sounds x4 quadrants. . Muskuloskeletal: No cyanosis, clubbing or edema noted bilaterally . Neuro: CN II-XII  intact, strength, sensation, reflexes . Skin: No ulcerative lesions noted or rashes . Psychiatry: Judgement and insight appear normal. Mood is appropriate for condition and setting          Labs on Admission:  Basic Metabolic Panel: Recent Labs  Lab 06/25/20 2138  NA 132*  K 3.4*  CL 105  CO2 18*  GLUCOSE 115*  BUN 32*  CREATININE 1.41*  CALCIUM 8.5*   Liver Function Tests: Recent Labs  Lab 06/25/20 2138  AST 43*  ALT 39  ALKPHOS 252*  BILITOT 1.1  PROT 6.8  ALBUMIN 3.3*   Recent Labs  Lab 06/25/20 2138  LIPASE 17   Recent Labs  Lab 06/25/20 2138  AMMONIA 29   CBC: Recent Labs  Lab 06/25/20 2150  WBC 8.1  NEUTROABS 5.6  HGB 14.6  HCT 45.1  MCV 90.9  PLT 180   Cardiac Enzymes: No results for input(s): CKTOTAL, CKMB, CKMBINDEX, TROPONINI in the last  168 hours.  BNP (last 3 results) No results for input(s): BNP in the last 8760 hours.  ProBNP (last 3 results) No results for input(s): PROBNP in the last 8760 hours.  CBG: No results for input(s): GLUCAP in the last 168 hours.  Radiological Exams on Admission: CT Head Wo Contrast  Result Date: 06/26/2020 CLINICAL DATA:  Hypotension and lethargy EXAM: CT HEAD WITHOUT CONTRAST TECHNIQUE: Contiguous axial images were obtained from the base of the skull through the vertex without intravenous contrast. COMPARISON:  None. FINDINGS: Brain: Diffuse atrophic changes are noted. Findings of chronic white matter ischemic change are seen. No findings to suggest acute hemorrhage, acute infarction or space-occupying mass lesion are noted. Vascular: No hyperdense vessel or unexpected calcification. Skull: Normal. Negative for fracture or focal lesion. Sinuses/Orbits: No acute finding. Other: None. IMPRESSION: Chronic atrophic and ischemic changes without acute abnormality. Electronically Signed   By: Inez Catalina M.D.   On: 06/26/2020 01:03   CT Abdomen Pelvis W Contrast  Result Date: 06/26/2020 CLINICAL DATA:  Known  metastatic cecal carcinoma with abdominal distension EXAM: CT ABDOMEN AND PELVIS WITH CONTRAST TECHNIQUE: Multidetector CT imaging of the abdomen and pelvis was performed using the standard protocol following bolus administration of intravenous contrast. CONTRAST:  138mL OMNIPAQUE IOHEXOL 300 MG/ML  SOLN COMPARISON:  04/05/2020 PET-CT FINDINGS: Lower chest: No acute abnormality. Hepatobiliary: Multiple enhancing lesions are noted throughout the liver consistent with metastatic disease. Largest of these measures 16 cm in dimension. Gallbladder is within normal limits. Changes of prior embolotherapy in the gastro duodenal artery are seen. Pancreas: Unremarkable. No pancreatic ductal dilatation or surrounding inflammatory changes. Spleen: Normal in size without focal abnormality. Adrenals/Urinary Tract: Adrenal glands are within normal limits. Kidneys demonstrate a normal enhancement pattern bilaterally. Normal excretion is noted from the kidneys bilaterally. The bladder is decompressed Stomach/Bowel: Changes of prior right colonic surgery are noted. Ileocolic anastomosis is noted without obstructive change. Vascular/Lymphatic: Aortic atherosclerosis. No enlarged abdominal or pelvic lymph nodes. Reproductive: Prostate is mildly prominent indenting upon the bladder. This is stable from prior PET-CT. Other: No abdominal wall hernia or abnormality. No abdominopelvic ascites. Musculoskeletal: Increased sclerosis is noted within the sacrum which corresponds to a metastatic lesion seen on prior PET-CT. Some scattered sclerotic lesions are noted within the spine consistent with metastatic disease. IMPRESSION: Changes consistent with diffuse hepatic metastatic disease similar to that seen on prior PET-CT. Bony metastatic disease is noted. No acute abnormality is noted correspond with the patient's given clinical history. Electronically Signed   By: Inez Catalina M.D.   On: 06/26/2020 01:58   DG Chest Port 1 View  Result  Date: 06/25/2020 CLINICAL DATA:  Weakness EXAM: PORTABLE CHEST 1 VIEW COMPARISON:  09/23/2011 FINDINGS: Cardiac shadow is at the upper limits of normal in size. The lungs are well aerated bilaterally. No focal infiltrate is seen. No bony abnormality is noted. IMPRESSION: No acute abnormality noted. Electronically Signed   By: Inez Catalina M.D.   On: 06/25/2020 22:00    EKG: I independently viewed the EKG done and my findings are as followed: Sinus tachycardia at a rate of 108bpm  Assessment/Plan Present on Admission: . Malignant carcinoid tumor of cecum ([place])  Principal Problem:   Generalized weakness Active Problems:   Malignant carcinoid tumor of cecum ([facility])   Dehydration   AKI (acute kidney injury) ([place])   Generalized weakness This may be related to patient's history of cancer He was reported to have decreased appetite, Albumin level will be checked with  consideration for dietary consult based on level Protein supplement will be provided Continue fall precaution and neurochecks Continue PT/OT eval and treat  Dehydration Continue IV hydration  Lactic acidosis possible secondary to above Lactic acid 2.2 > 2.1; continue to trend lactic acid  Hyponatremia Na 132, continue IV hydration  Hypokalemia K+ 3.4, this will be replenished  Acute kidney injury  BUN to creatinine 32/1.41 (baseline creatinine 1.0-1.14) Rnally adjust medications, avoid nephrotoxic agents/dehydration/hypotension  Malignant carcinoid tumor of cecum with metastasis CT abdomen pelvis showed changes consistent with diffuse hepatic metastatic disease similar to that seen on prior PET-CT. Patient denies any acute complaints at this time Patient will follow-up with outpatient oncology  DVT prophylaxis: Subcu heparin  Code Status: Full code  Family Communication: None at bedside  Disposition Plan:  Patient is from:                        home Anticipated DC to:                   SNF or family members  home Anticipated DC date:             24 hours Anticipated DC barriers:         Pending PT/OT eval and recommendation   Consults called: None  Admission status: Observation    Bernadette Hoit MD [facility]  If 7PM-7AM, please contact night-coverage [email or web address]  06/26/2020, 6:45 AM

## 2020-06-26 NOTE — Plan of Care (Signed)
  Problem: Acute Rehab PT Goals(only PT should resolve) Goal: Pt Will Go Supine/Side To Sit Outcome: Progressing Flowsheets (Taken 06/26/2020 1224) Pt will go Supine/Side to Sit:  Independently  with modified independence Goal: Patient Will Transfer Sit To/From Stand Outcome: Progressing Flowsheets (Taken 06/26/2020 1224) Patient will transfer sit to/from stand:  with supervision  with modified independence Goal: Pt Will Transfer Bed To Chair/Chair To Bed Outcome: Progressing Flowsheets (Taken 06/26/2020 1224) Pt will Transfer Bed to Chair/Chair to Bed:  with supervision  with modified independence Goal: Pt Will Ambulate Outcome: Progressing Flowsheets (Taken 06/26/2020 1224) Pt will Ambulate:  50 feet  with supervision  with cane  with rolling walker   12:24 PM, 06/26/20 Lonell Grandchild, MPT Physical Therapist with [facility] [phone] office [phone] mobile phone

## 2020-06-27 DIAGNOSIS — K2270 Barrett's esophagus without dysplasia: Secondary | ICD-10-CM | POA: Diagnosis present

## 2020-06-27 DIAGNOSIS — Z888 Allergy status to other drugs, medicaments and biological substances status: Secondary | ICD-10-CM | POA: Diagnosis not present

## 2020-06-27 DIAGNOSIS — E876 Hypokalemia: Secondary | ICD-10-CM | POA: Diagnosis present

## 2020-06-27 DIAGNOSIS — I959 Hypotension, unspecified: Secondary | ICD-10-CM | POA: Diagnosis present

## 2020-06-27 DIAGNOSIS — C787 Secondary malignant neoplasm of liver and intrahepatic bile duct: Secondary | ICD-10-CM | POA: Diagnosis present

## 2020-06-27 DIAGNOSIS — E860 Dehydration: Secondary | ICD-10-CM | POA: Diagnosis present

## 2020-06-27 DIAGNOSIS — C7951 Secondary malignant neoplasm of bone: Secondary | ICD-10-CM | POA: Diagnosis present

## 2020-06-27 DIAGNOSIS — E872 Acidosis: Secondary | ICD-10-CM | POA: Diagnosis present

## 2020-06-27 DIAGNOSIS — R531 Weakness: Secondary | ICD-10-CM | POA: Diagnosis not present

## 2020-06-27 DIAGNOSIS — I10 Essential (primary) hypertension: Secondary | ICD-10-CM | POA: Diagnosis present

## 2020-06-27 DIAGNOSIS — E871 Hypo-osmolality and hyponatremia: Secondary | ICD-10-CM | POA: Diagnosis present

## 2020-06-27 DIAGNOSIS — G9341 Metabolic encephalopathy: Secondary | ICD-10-CM

## 2020-06-27 DIAGNOSIS — Z79899 Other long term (current) drug therapy: Secondary | ICD-10-CM | POA: Diagnosis not present

## 2020-06-27 DIAGNOSIS — N390 Urinary tract infection, site not specified: Secondary | ICD-10-CM | POA: Diagnosis present

## 2020-06-27 DIAGNOSIS — Z83438 Family history of other disorder of lipoprotein metabolism and other lipidemia: Secondary | ICD-10-CM | POA: Diagnosis not present

## 2020-06-27 DIAGNOSIS — B961 Klebsiella pneumoniae [K. pneumoniae] as the cause of diseases classified elsewhere: Secondary | ICD-10-CM | POA: Diagnosis present

## 2020-06-27 DIAGNOSIS — N400 Enlarged prostate without lower urinary tract symptoms: Secondary | ICD-10-CM | POA: Diagnosis present

## 2020-06-27 DIAGNOSIS — N179 Acute kidney failure, unspecified: Secondary | ICD-10-CM | POA: Diagnosis present

## 2020-06-27 DIAGNOSIS — Z8249 Family history of ischemic heart disease and other diseases of the circulatory system: Secondary | ICD-10-CM | POA: Diagnosis not present

## 2020-06-27 DIAGNOSIS — F17210 Nicotine dependence, cigarettes, uncomplicated: Secondary | ICD-10-CM | POA: Diagnosis present

## 2020-06-27 DIAGNOSIS — C7A021 Malignant carcinoid tumor of the cecum: Secondary | ICD-10-CM | POA: Diagnosis present

## 2020-06-27 DIAGNOSIS — Z20822 Contact with and (suspected) exposure to covid-19: Secondary | ICD-10-CM | POA: Diagnosis present

## 2020-06-27 LAB — CBC
HCT: 37.1 % — ABNORMAL LOW (ref 39.0–52.0)
Hemoglobin: 12 g/dL — ABNORMAL LOW (ref 13.0–17.0)
MCH: 29.4 pg (ref 26.0–34.0)
MCHC: 32.3 g/dL (ref 30.0–36.0)
MCV: 90.9 fL (ref 80.0–100.0)
Platelets: 141 10*3/uL — ABNORMAL LOW (ref 150–400)
RBC: 4.08 MIL/uL — ABNORMAL LOW (ref 4.22–5.81)
RDW: 15.5 % (ref 11.5–15.5)
WBC: 7.2 10*3/uL (ref 4.0–10.5)
nRBC: 0 % (ref 0.0–0.2)

## 2020-06-27 LAB — COMPREHENSIVE METABOLIC PANEL
ALT: 31 U/L (ref 0–44)
AST: 29 U/L (ref 15–41)
Albumin: 2.5 g/dL — ABNORMAL LOW (ref 3.5–5.0)
Alkaline Phosphatase: 194 U/L — ABNORMAL HIGH (ref 38–126)
Anion gap: 7 (ref 5–15)
BUN: 29 mg/dL — ABNORMAL HIGH (ref 8–23)
CO2: 18 mmol/L — ABNORMAL LOW (ref 22–32)
Calcium: 8.3 mg/dL — ABNORMAL LOW (ref 8.9–10.3)
Chloride: 111 mmol/L (ref 98–111)
Creatinine, Ser: 0.94 mg/dL (ref 0.61–1.24)
GFR calc Af Amer: >60 mL/min (ref >60)
GFR calc non Af Amer: >60 mL/min (ref >60)
Glucose, Bld: 84 mg/dL (ref 70–99)
Potassium: 4.2 mmol/L (ref 3.5–5.1)
Sodium: 136 mmol/L (ref 135–145)
Total Bilirubin: 0.9 mg/dL (ref 0.3–1.2)
Total Protein: 5.3 g/dL — ABNORMAL LOW (ref 6.5–8.1)

## 2020-06-27 LAB — APTT: aPTT: 34 seconds (ref 24–36)

## 2020-06-27 LAB — PROTIME-INR
INR: 1.3 — ABNORMAL HIGH (ref 0.8–1.2)
Prothrombin Time: 15.3 seconds — ABNORMAL HIGH (ref 11.4–15.2)

## 2020-06-27 MED ORDER — SODIUM CHLORIDE 0.9 % IV SOLN: INTRAVENOUS | Status: AC

## 2020-06-27 NOTE — Progress Notes (Signed)
Patient Demographics:    Tommy Conley, is a 69 y.o. male, DOB - [birth year], YBO:[id number]  Admit date - 06/25/2020   Admitting Physician Torian Thoennes Denton Brick, MD  Outpatient Primary MD for the patient is Erven Colla, DO  LOS - 0   Chief Complaint  Patient presents with  . 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   Generalized weakness   AKI (acute kidney injury) ([place])   Dehydration   Acute lower GNR UTI  Brief Summary:- 69 year old with past medical history relevant for metastatic stage IV carcinoid tumor of the cecum with metastases to the liver and bony metastasis as well, HTN and BPH who was admitted on 06/26/2020 with generalized weakness, dehydration, mild hypotension and found to have gram-negative rod UTI with altered mentation/encephalopathy  A/p  1)Acute metabolic encephalopathy--secondary to gram-negative rod UTI as well as dehydration and AKI -As per patient's daughter [name] patient's mentation is not back to baseline, remains intermittently confused and disoriented -Treat underlying etiologies including UTI and dehydration  2)GNR UTI-IV Rocephin pending further culture data  3)Dehydration and soft BP--improving, IV normal saline as ordered until oral intake improves enough  4)AKI----acute kidney injury due to dehydration and UTI,   creatinine on admission=1.41  ,  baseline creatinine = 0.9 to 1.0   , creatinine is now= 0.94 ,  -AKI improving with hydration --renally adjust medications, avoid nephrotoxic agents / dehydration  / hypotension -Continue gentle hydration with IV fluids  5) metastatic stage IV carcinoid tumor of the Cecum with bony and liver mets--- diagnosed back in 2012 --- lately has been on monthly injections of Sandostatin ---patient follows up with Dr. Annitta Jersey his oncologist he has an appointment later this month -PET scan from April 2021 suggested disease progression -CT scan from July 2021 also suggested disease progression -Further management per patient's oncologist who is trying to consider or the alternatives but patient is yet to decide if he wants to pursue changing therapy   Disposition/Need for in-Hospital Stay- patient unable to be discharged at this time due to --- metabolic encephalopathy in the setting of UTI and dehydration with AKI requiring IV antibiotics and IV fluids  Status is: Inpatient  Remains inpatient appropriate because:metabolic encephalopathy in the setting of UTI and dehydration with AKI requiring IV antibiotics and IV fluids   Disposition: The patient is from: Home              Anticipated d/c is to: Home with [facility]               Anticipated d/c date is: 2 days              Patient currently is not medically stable to  d/c. Barriers: Not Clinically Stable- -metabolic encephalopathy in the setting of UTI and dehydration with AKI requiring IV antibiotics and IV fluids  Code Status : Full  Family Communication:   Daughter [name]    Consults  :  na  DVT Prophylaxis  :   - Heparin - SCDs   Lab Results  Component Value Date   PLT 141 (L) 06/27/2020    Inpatient Medications  Scheduled Meds: . feeding supplement (ENSURE ENLIVE)  237 mL Oral BID BM  . heparin  5,000 Units Subcutaneous Q8H  . potassium chloride SA  20 mEq Oral BID   Continuous Infusions: . sodium chloride    . cefTRIAXone (ROCEPHIN)   IV 1 g (06/26/20 1552)   PRN Meds:.    Anti-infectives (From admission, onward)   Start     Dose/Rate Route Frequency Ordered Stop   06/26/20 1445  cefTRIAXone (ROCEPHIN) 1 g in sodium chloride 0.9 % 100 mL IVPB     Discontinue     1 g 200 mL/hr over 30 Minutes Intravenous Every 24 hours 06/26/20 1440          Objective:   Vitals:   06/26/20 1012 06/26/20 1822 06/26/20 2155 06/27/20 0507  BP: 117/79 (!) 128/56 101/67 99/69  Pulse: 87  65 83  Resp: 16  20 20   Temp: 98.6 F (37 C) 98.3 F (36.8 C) (!) 97.4 F (36.3 C) 98.5 F (36.9 C)  TempSrc: Oral Oral Oral Oral  SpO2: 100% 100% 100% 100%  Weight:      Height:        Wt Readings from Last 3 Encounters:  06/25/20 95.3 kg  05/16/20 91.6 kg  05/13/20 91.9 kg     Intake/Output Summary (Last 24 hours) at 06/27/2020 1153 Last data filed at 06/27/2020 0600 Gross per 24 hour  Intake 340 ml  Output 700 ml  Net -360 ml     Physical Exam  Gen:- Awake, intermittently confused and disoriented HEENT:- [place].AT, No sclera icterus Neck-Supple Neck,No JVD,.  Lungs-  CTAB , fair symmetrical air movement CV- S1, S2 normal, regular  Abd-  +ve B.Sounds, Abd Soft, No tenderness, no CVA area tenderness Extremity/Skin:- No  edema, pedal pulses present  Psych-episodes of confusion and disorientation persist Neuro-generalized weakness, no new focal deficits, no tremors   Data Review:   Micro Results Recent Results (from the past 240 hour(s))  Culture, blood (routine x 2)     Status: None (Preliminary result)   Collection Time: 06/25/20  9:40 PM   Specimen: Left Antecubital; Blood  Result Value Ref Range Status   Specimen Description LEFT ANTECUBITAL  Final   Special Requests   Final    BOTTLES DRAWN AEROBIC AND ANAEROBIC Blood Culture adequate volume   Culture   Final    NO GROWTH 2 DAYS Performed at [facility], [address], [place], [place] [address]    Report Status PENDING  Incomplete  Culture, blood (routine x 2)      Status: None (Preliminary result)   Collection Time: 06/25/20  9:40 PM   Specimen: BLOOD RIGHT WRIST  Result Value Ref Range Status   Specimen Description BLOOD RIGHT WRIST  Final   Special Requests   Final    BOTTLES DRAWN AEROBIC AND ANAEROBIC Blood Culture adequate volume   Culture   Final    NO GROWTH 2 DAYS Performed at [facility], [address], [place],  [address]    Report Status PENDING  Incomplete  Urine culture     Status: Abnormal (Preliminary result)   Collection Time: 06/25/20 11:35 PM   Specimen: Urine, Clean Catch  Result Value Ref Range Status   Specimen Description   Final    URINE, CLEAN CATCH Performed at [facility], [address] [place], [place] [address]    Special Requests   Final    NONE Performed at [facility], [address] [place], [place] [address]    Culture >=100,000 COLONIES/mL KLEBSIELLA PNEUMONIAE (A)  Final   Report Status PENDING  Incomplete  SARS Coronavirus 2 by RT PCR (hospital order, performed in [place] hospital lab) Nasopharyngeal Nasopharyngeal Swab     Status: None   Collection Time: 06/26/20  2:25 AM   Specimen: Nasopharyngeal Swab  Result Value Ref Range Status   SARS Coronavirus 2 NEGATIVE NEGATIVE Final    Comment: (NOTE) SARS-CoV-2 target nucleic acids are NOT DETECTED.  The SARS-CoV-2 RNA is generally detectable in upper and lower respiratory specimens during the acute phase of infection. The lowest concentration of SARS-CoV-2 viral copies this assay can detect is 250 copies / mL. A negative result does not preclude SARS-CoV-2 infection and should not be used as the sole basis for treatment or other patient management decisions.  A negative result may occur with improper specimen collection / handling, submission of specimen other than nasopharyngeal swab, presence of viral mutation(s) within the areas targeted by this assay, and inadequate number of viral copies (<250 copies / mL). A negative result must  be combined with clinical observations, patient history, and epidemiological information.  Fact Sheet for Patients:   StrictlyIdeas.no  Fact Sheet for Healthcare Providers: BankingDealers.co.za  This test is not yet approved or  cleared by the [place] FDA and has been authorized for detection and/or diagnosis of SARS-CoV-2 by FDA under an Emergency Use Authorization (EUA).  This EUA will remain in effect (meaning this test can be used) for the duration of the COVID-19 declaration under Section 564(b)(1) of the Act, 21 U.S.C. section 360bbb-3(b)(1), unless the authorization is terminated or revoked sooner.  Performed at [facility], [address], [place],  [address]     Radiology Reports CT Head Wo Contrast  Result Date: 06/26/2020 CLINICAL DATA:  Hypotension and lethargy EXAM: CT HEAD WITHOUT CONTRAST TECHNIQUE: Contiguous axial images were obtained from the base of the skull through the vertex without intravenous contrast. COMPARISON:  None. FINDINGS: Brain: Diffuse atrophic changes are noted. Findings of chronic white matter ischemic change are seen. No findings to suggest acute hemorrhage, acute infarction or space-occupying mass lesion are noted. Vascular: No hyperdense vessel or unexpected calcification. Skull: Normal. Negative for fracture or focal lesion. Sinuses/Orbits: No acute finding. Other: None. IMPRESSION: Chronic atrophic and ischemic changes without acute abnormality. Electronically Signed   By: Inez Catalina M.D.   On: 06/26/2020 01:03   CT Abdomen Pelvis W Contrast  Result Date: 06/26/2020 CLINICAL DATA:  Known metastatic cecal carcinoma with abdominal distension EXAM: CT ABDOMEN AND PELVIS WITH CONTRAST TECHNIQUE: Multidetector CT imaging of the abdomen and pelvis was performed using the standard protocol following bolus administration of intravenous contrast. CONTRAST:  123mL OMNIPAQUE IOHEXOL 300 MG/ML  SOLN  COMPARISON:  04/05/2020 PET-CT FINDINGS: Lower chest: No acute abnormality. Hepatobiliary: Multiple enhancing lesions are noted throughout the liver consistent with metastatic disease. Largest of these measures 16 cm in dimension. Gallbladder is within normal limits. Changes of prior embolotherapy in the gastro duodenal artery are seen. Pancreas: Unremarkable. No pancreatic ductal  dilatation or surrounding inflammatory changes. Spleen: Normal in size without focal abnormality. Adrenals/Urinary Tract: Adrenal glands are within normal limits. Kidneys demonstrate a normal enhancement pattern bilaterally. Normal excretion is noted from the kidneys bilaterally. The bladder is decompressed Stomach/Bowel: Changes of prior right colonic surgery are noted. Ileocolic anastomosis is noted without obstructive change. Vascular/Lymphatic: Aortic atherosclerosis. No enlarged abdominal or pelvic lymph nodes. Reproductive: Prostate is mildly prominent indenting upon the bladder. This is stable from prior PET-CT. Other: No abdominal wall hernia or abnormality. No abdominopelvic ascites. Musculoskeletal: Increased sclerosis is noted within the sacrum which corresponds to a metastatic lesion seen on prior PET-CT. Some scattered sclerotic lesions are noted within the spine consistent with metastatic disease. IMPRESSION: Changes consistent with diffuse hepatic metastatic disease similar to that seen on prior PET-CT. Bony metastatic disease is noted. No acute abnormality is noted correspond with the patient's given clinical history. Electronically Signed   By: Inez Catalina M.D.   On: 06/26/2020 01:58   DG Chest Port 1 View  Result Date: 06/25/2020 CLINICAL DATA:  Weakness EXAM: PORTABLE CHEST 1 VIEW COMPARISON:  09/23/2011 FINDINGS: Cardiac shadow is at the upper limits of normal in size. The lungs are well aerated bilaterally. No focal infiltrate is seen. No bony abnormality is noted. IMPRESSION: No acute abnormality noted.  Electronically Signed   By: Inez Catalina M.D.   On: 06/25/2020 22:00     CBC Recent Labs  Lab 06/25/20 2150 06/27/20 0455  WBC 8.1 7.2  HGB 14.6 12.0*  HCT 45.1 37.1*  PLT 180 141*  MCV 90.9 90.9  MCH 29.4 29.4  MCHC 32.4 32.3  RDW 15.8* 15.5  LYMPHSABS 1.5  --   MONOABS 0.9  --   EOSABS 0.0  --   BASOSABS 0.0  --     Chemistries  Recent Labs  Lab 06/25/20 2138 06/27/20 0455  NA 132* 136  K 3.4* 4.2  CL 105 111  CO2 18* 18*  GLUCOSE 115* 84  BUN 32* 29*  CREATININE 1.41* 0.94  CALCIUM 8.5* 8.3*  AST 43* 29  ALT 39 31  ALKPHOS 252* 194*  BILITOT 1.1 0.9   ------------------------------------------------------------------------------------------------------------------ No results for input(s): CHOL, HDL, LDLCALC, TRIG, CHOLHDL, LDLDIRECT in the last 72 hours.  No results found for: HGBA1C ------------------------------------------------------------------------------------------------------------------ No results for input(s): TSH, T4TOTAL, T3FREE, THYROIDAB in the last 72 hours.  Invalid input(s): FREET3 ------------------------------------------------------------------------------------------------------------------ No results for input(s): VITAMINB12, FOLATE, FERRITIN, TIBC, IRON, RETICCTPCT in the last 72 hours.  Coagulation profile Recent Labs  Lab 06/27/20 0455  INR 1.3*    No results for input(s): DDIMER in the last 72 hours.  Cardiac Enzymes No results for input(s): CKMB, TROPONINI, MYOGLOBIN in the last 168 hours.  Invalid input(s): CK ------------------------------------------------------------------------------------------------------------------ No results found for: BNP   Roxan Hockey M.D on 06/27/2020 at 11:53 AM  Go to [email or web address] - for contact info  [facility] - Office  [phone]

## 2020-06-27 NOTE — Plan of Care (Signed)
  Problem: Acute Rehab OT Goals (only OT should resolve) Goal: Pt. Will Perform Eating Flowsheets (Taken 06/27/2020 0751) Pt Will Perform Eating:  with modified independence  sitting Goal: Pt. Will Perform Grooming Flowsheets (Taken 06/27/2020 0751) Pt Will Perform Grooming:  with supervision  standing Goal: Pt. Will Perform Lower Body Dressing Flowsheets (Taken 06/27/2020 0751) Pt Will Perform Lower Body Dressing:  with supervision  sitting/lateral leans  sit to/from stand Goal: Pt. Will Transfer To Toilet Flowsheets (Taken 06/27/2020 0751) Pt Will Transfer to Toilet:  with supervision  ambulating  regular height toilet Goal: Pt. Will Perform Toileting-Clothing Manipulation Flowsheets (Taken 06/27/2020 0751) Pt Will Perform Toileting - Clothing Manipulation and hygiene:  with supervision  sitting/lateral leans  sit to/from stand Goal: Pt/Caregiver Will Perform Home Exercise Program Flowsheets (Taken 06/27/2020 0751) Pt/caregiver will Perform Home Exercise Program:  Increased strength  Both right and left upper extremity  Independently  With written HEP provided

## 2020-06-27 NOTE — Evaluation (Signed)
Occupational Therapy Evaluation Patient Details Name: Tommy Conley MRN: [id number] DOB: [birth year] Today's Date: 06/27/2020    History of Present Illness Tommy Conley is a 69 y.o. male with medical history significant for hypertension, BPH, malignant carcinoid tumor of the cecum who presents to the emergency department due to generalized weakness.  Patient was unable to provide history, history was obtained from ED physician and ED chart.  Per report, it was reported to have started to decline since he had prostate surgery about 6 weeks ago by daughter, he has not been eating much and appears dehydrated with increased darkening of urine.  At bedside, patient denies any discomfort, pain, headache, fever, chills.  He was alert and oriented x3 (though initially he thought it was 2012, but he quickly corrected himself and said that it was 2021), but was unable to tell me why he came to the hospital.   Clinical Impression   Pt attempting to get up and go to the bathroom on OT arrival. Pt unsteady and holding walls during functional mobility. Declined to attempt follow up grooming tasks at sink after toileting. Pt with generalized weakness and fatigue, able to perform seated ADLs with set-up/supervision, balance deficits limiting completion of standing tasks. Recommend HHOT on discharge to assess pt environment and improve safety and independence in ADL completion.     Follow Up Recommendations  Home health OT    Equipment Recommendations  Tub/shower seat       Precautions / Restrictions Precautions Precautions: Fall Restrictions Weight Bearing Restrictions: No      Mobility Bed Mobility Overal bed mobility: Modified Independent                Transfers Overall transfer level: Needs assistance Equipment used: None Transfers: Sit to/from Stand;Stand Pivot Transfers Sit to Stand: Min guard Stand pivot transfers: Min guard                ADL either performed or  assessed with clinical judgement   ADL Overall ADL's : Needs assistance/impaired     Grooming: Wash/dry hands;Set up;Sitting Grooming Details (indicate cue type and reason): declined to perform at sink                 Toilet Transfer: Supervision/safety;Ambulation;Regular Toilet;Grab bars Toilet Transfer Details (indicate cue type and reason): holding grab bars for support Toileting- Clothing Manipulation and Hygiene: Supervision/safety;Sitting/lateral lean;Sit to/from stand       Functional mobility during ADLs: Min guard General ADL Comments: Pt holding walls while gonig from bed to bathroom     Vision Baseline Vision/History: No visual deficits Patient Visual Report: No change from baseline Vision Assessment?: No apparent visual deficits            Pertinent Vitals/Pain Pain Assessment: No/denies pain     Hand Dominance Right   Extremity/Trunk Assessment Upper Extremity Assessment Upper Extremity Assessment: Generalized weakness   Lower Extremity Assessment Lower Extremity Assessment: Defer to PT evaluation   Cervical / Trunk Assessment Cervical / Trunk Assessment: Normal   Communication Communication Communication: No difficulties   Cognition Arousal/Alertness: Awake/alert Behavior During Therapy: WFL for tasks assessed/performed Overall Cognitive Status: Within Functional Limits for tasks assessed                                                Home Living Family/patient expects to be discharged  to:: Private residence Living Arrangements: Alone Available Help at Discharge: Family;Available PRN/intermittently Type of Home: Apartment Home Access: Level entry     Home Layout: One level     Bathroom Shower/Tub: Teacher, early years/pre: Standard     Home Equipment: Cane - single point          Prior Functioning/Environment Level of Independence: Independent        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         OT Problem List: Decreased strength;Decreased activity tolerance;Impaired balance (sitting and/or standing);Decreased safety awareness;Decreased knowledge of use of DME or AE      OT Treatment/Interventions: Self-care/ADL training;Therapeutic exercise;DME and/or AE instruction;Therapeutic activities;Patient/family education    OT Goals(Current goals can be found in the care plan section) Acute Rehab OT Goals Patient Stated Goal: return home OT Goal Formulation: With patient Time For Goal Achievement: 07/11/20 Potential to Achieve Goals: Good  OT Frequency: Min 2X/week    AM-PAC OT "6 Clicks" Daily Activity     Outcome Measure Help from another person eating meals?: None Help from another person taking care of personal grooming?: A Little Help from another person toileting, which includes using toliet, bedpan, or urinal?: A Little Help from another person bathing (including washing, rinsing, drying)?: A Little Help from another person to put on and taking off regular upper body clothing?: None Help from another person to put on and taking off regular lower body clothing?: A Little 6 Click Score: 20   End of Session    Activity Tolerance: Patient tolerated treatment well Patient left: in bed;with call bell/phone within reach;with bed alarm set  OT Visit Diagnosis: Unsteadiness on feet (R26.81);Muscle weakness (generalized) (M62.81)                Time: 9417-4081 OT Time Calculation (min): 15 min Charges:  OT General Charges $OT Visit: 1 Visit OT Evaluation $OT Eval Low Complexity: [place], OTR/L  [phone] 06/27/2020, 7:50 AM

## 2020-06-28 DIAGNOSIS — K7682 Hepatic encephalopathy: Secondary | ICD-10-CM | POA: Diagnosis present

## 2020-06-28 LAB — BASIC METABOLIC PANEL WITH GFR
Anion gap: 7 (ref 5–15)
BUN: 28 mg/dL — ABNORMAL HIGH (ref 8–23)
CO2: 17 mmol/L — ABNORMAL LOW (ref 22–32)
Calcium: 8.5 mg/dL — ABNORMAL LOW (ref 8.9–10.3)
Chloride: 113 mmol/L — ABNORMAL HIGH (ref 98–111)
Creatinine, Ser: 0.87 mg/dL (ref 0.61–1.24)
GFR calc Af Amer: >60 mL/min
GFR calc non Af Amer: >60 mL/min
Glucose, Bld: 87 mg/dL (ref 70–99)
Potassium: 4.2 mmol/L (ref 3.5–5.1)
Sodium: 137 mmol/L (ref 135–145)

## 2020-06-28 LAB — URINE CULTURE: Culture: >=100000 — AB

## 2020-06-28 LAB — AMMONIA: Ammonia: 61 umol/L — ABNORMAL HIGH (ref 9–35)

## 2020-06-28 MED ORDER — TAMSULOSIN HCL 0.4 MG PO CAPS: 0.4000 mg | ORAL_CAPSULE | Freq: Every day | ORAL | 3 refills | Status: AC

## 2020-06-28 MED ORDER — LACTULOSE ENCEPHALOPATHY 10 GM/15ML PO SOLN: 10.0000 g | Freq: Every day | ORAL | 0 refills | Status: DC

## 2020-06-28 MED ORDER — ATENOLOL 25 MG PO TABS
25.0000 mg | ORAL_TABLET | Freq: Every day | ORAL | 1 refills | Status: DC
Start: 2020-06-28 — End: 2020-07-18

## 2020-06-28 MED ORDER — CEPHALEXIN 500 MG PO CAPS: 500.0000 mg | ORAL_CAPSULE | Freq: Three times a day (TID) | ORAL | 0 refills | Status: AC

## 2020-06-28 NOTE — Progress Notes (Signed)
PT Cancellation Note  Patient Details Name: VIPUL CAFARELLI MRN: [id number] DOB: 1951/09/18   Cancelled Treatment:    Reason Eval/Treat Not Completed: Fatigue/lethargy limiting ability to participate;Patient declined, no reason specified; Patient stated he was tired and did not want to participate with PT at this time. Will check back later if time permits   41:36 AM, 43/83/77 Mearl Latin PT, DPT Physical Therapist at [facility]

## 2020-06-28 NOTE — Care Management Important Message (Signed)
Important Message  Patient Details  Name: Tommy Conley MRN: [id number] Date of Birth: 06/28/1951   Medicare Important Message Given:  Yes     Tommy Medal 06/28/2020, 12:42 PM

## 2020-06-28 NOTE — Discharge Instructions (Signed)
1) take lactulose as prescribed with a goal of having 2-3 mushy but not watery bowel movements per day 2) keep your appointment with your oncologist Dr. Learta Codding for 07/11/20--to discuss further treatment options for progressive cancer 3) please take Keflex antibiotics as prescribed for urinary tract infection 4) please drink plenty fluids to avoid dehydration 5) repeat CBC and CMP test advised next week around Wednesday, 07/03/2020 6)Avoid ibuprofen/Advil/Aleve/Motrin/Goody Powders/Naproxen/BC powders/Meloxicam/Diclofenac/Indomethacin and other Nonsteroidal anti-inflammatory medications as these will make you more likely to bleed and can cause stomach ulcers, can also cause Kidney problems.

## 2020-06-28 NOTE — TOC Transition Note (Signed)
Transition of Care [facility]) - CM/SW Discharge Note   Patient Details  Name: Tommy Conley MRN: [id number] Date of Birth: October 21, 1951  Transition of Care [facility]) CM/SW Contact:  Ihor Gully, LCSW Phone Number: 06/28/2020, 1:03 PM   Clinical Narrative:    Spoke with daughter regarding HH. DAughter agreeable. [facility] is accepting of patient's insurance. Attending to place RN/PT orders.   Final next level of care: [place] Barriers to Discharge: No Barriers Identified   Patient Goals and CMS Choice     Choice offered to / list presented to : Adult Children  Discharge Placement                       Discharge Plan and Services                DME Arranged: [place] rolling DME Agency: [employer] Date DME Agency Contacted: 06/27/20 Time DME Agency Contacted: 1100 Representative spoke with at DME Agency: Barbaraann Rondo HH Arranged: RN, PT [facility] [place] Agency: [place] ([place]) Date [place]: 06/28/20 Time [place]: 1302 Representative spoke with at [place]: [place] ([place]) Interventions     Readmission Risk Interventions No flowsheet data found.

## 2020-06-28 NOTE — Progress Notes (Signed)
IV removed to left AC, 2x2 gauze and paper tape applied to site, patient tolerated well. Reviewed AVS with patient's daughter who verbalized understanding.  Patient to be taken to lobby via wheelchair and transported home by his daughter.

## 2020-06-28 NOTE — Discharge Summary (Signed)
Tommy Conley, is a 69 y.o. male  DOB [birth year]  MRN [id number].  Admission date:  06/25/2020  Admitting Physician  Roxan Hockey, MD  Discharge Date:  06/28/2020   Primary MD  Erven Colla, DO  Recommendations for primary care physician for things to follow:   1) take lactulose as prescribed with a goal of having 2-3 mushy but not watery bowel movements per day 2) keep your appointment with your oncologist Dr. Learta Codding for 07/11/20--to discuss further treatment options for progressive cancer 3) please take Keflex antibiotics as prescribed for urinary tract infection 4) please drink plenty fluids to avoid dehydration 5) repeat CBC and CMP test advised next week around Wednesday, 07/03/2020 6)Avoid ibuprofen/Advil/Aleve/Motrin/Goody Powders/Naproxen/BC powders/Meloxicam/Diclofenac/Indomethacin and other Nonsteroidal anti-inflammatory medications as these will make you more likely to bleed and can cause stomach ulcers, can also cause Kidney problems.   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 Allergy   . Barrett's esophagus   . BPH (benign prostatic  hyperplasia)   . Carcinoid tumor of cecum   . Colon polyp   . Complication of anesthesia 2010   woke up during radiation pellet insertion  . Foley catheter in place last 3- 4 weeks  . GERD (gastroesophageal reflux disease)   . Heart murmur    "had all my left" no cardiologist currently had checked 3-4 times in past  . Hypertension   . IBS (irritable bowel syndrome)   . Malignant carcinoid tumors of other sites [facility]) 08/25/2016   colon  . Perennial allergic rhinitis     Past Surgical History:  Procedure Laterality Date  . colectomy  09/29/11   lap - right with liver bx  . COLON SURGERY  2017  . COLONOSCOPY  07/2011  . radioembolization right liver  2016  . TRANSURETHRAL RESECTION OF PROSTATE N/A 05/13/2020   Procedure: TRANSURETHRAL RESECTION OF THE PROSTATE (TURP);  Surgeon: Kathie Rhodes, MD;  Location: [facility];  Service: Urology;  Laterality: N/A;  . UPPER GASTROINTESTINAL ENDOSCOPY     hx barrett's esophagus  . WEDGE LIVER BIOPSY  09/29/11   right liver lesion     HPI  from the history and physical done on the day of admission:     Chief Complaint: Generalized weakness  HPI: Tommy Conley is a 69 y.o. male with medical  history significant for hypertension, BPH, malignant carcinoid tumor of the cecum who presents to the emergency department due to generalized weakness.  Patient was unable to provide history, history was obtained from ED physician and ED chart.  Per report, it was reported to have started to decline since he had prostate surgery about 6 weeks ago by daughter, he has not been eating much and appears dehydrated with increased darkening of urine.  At bedside, patient denies any discomfort, pain, headache, fever, chills.  He was alert and oriented x3 (though initially he thought it was 2012, but he quickly corrected himself and said that it was 2021), but was unable to tell me why he came to the hospital.  ED Course:  In the emergency  department, he was hemodynamically stable.  Work-up in the ED showed normal CBC, hyponatremia, hypokalemia, BUN to creatinine 32/1.41 (baseline creatinine 1.0-1.14).  Lactic acid 2.2>2.1.  AST 43.  CT abdomen pelvis showed changes consistent with diffuse hepatic metastatic disease similar to that seen on prior PET-CT.  CT of head without contrast and chest x-ray showed no acute abnormality.  IV hydration with 1 L of NS was given.  Hospitalist was asked to admit him for further evaluation management.    Hospital Course:    Brief Summary:- 69 year old with past medical history relevant for metastatic stage IV carcinoid tumor of the cecum with metastases to the liver and bony metastasis as well, HTN and BPH who was admitted on 06/26/2020 with generalized weakness, dehydration, mild hypotension and found to have Klebsiella UTI with altered mentation/encephalopathy  A/p  1)Acute metabolic encephalopathy--secondary to Klebsiella UTI as well as dehydration and AKI -As per patient's daughter [name] patient's mentation much improved with with treatment of underlying etiologies including-UTI and dehydration -Take lactulose as prescribed to avoid hepatic encephalopathy in the setting of liver tumors  2) Klebsiella UTI-treated with IV Rocephin , discharged on p.o. Keflex  3)Dehydration and soft BP--improved with hydration, oral intake improving as well  4)AKI----acute kidney injury due to dehydration and UTI,   creatinine on admission=1.41  , baseline creatinine = 0.9 to 1.0   , creatinine is now= 0.94 ,  -AKI resolved with hydration --renally adjust medications, avoid nephrotoxic agents / dehydration  / hypotension  5) metastatic stage IV carcinoid tumor of the Cecum with bony and liver mets--- diagnosed back in 2012 --- lately has been on monthly injections of Sandostatin ---patient follows up with Dr. Annitta Jersey his oncologist he has an appointment later this month -PET scan from April 2021 suggested  disease progression -CT scan from July 2021 also suggested disease progression -Further management per patient's oncologist who is trying to consider or the alternatives but patient is yet to decide if he wants to pursue changing therapy -Patient and daughter [name] will try to link with oncologist and discuss exploring other treatment options for patient  6) generalized weakness and deconditioning--- PT eval appreciated, okay to discharge home with home health physical therapy  Disposition/Need for in-Hospital Stay- patient unable to be discharged at this time due to --- metabolic encephalopathy in the setting of UTI and dehydration with AKI requiring IV antibiotics and IV fluids  Status is: Inpatient  Remains inpatient appropriate because:metabolic encephalopathy in the setting of UTI and dehydration with AKI requiring IV antibiotics and IV fluids   Disposition: The patient is from: Home  Anticipated d/c is to: Home with [facility]   Anticipated d/c date is: 2 days  Patient currently is not medically stable to d/c.  Barriers: Not Clinically Stable- -metabolic encephalopathy in the setting of UTI and dehydration with AKI requiring IV antibiotics and IV fluids  Code Status : Full  Family Communication:   Daughter [name]    Consults  :  na  Discharge Condition: stable  Follow UP--- oncologist as discussed   Consults obtained - na  Diet and Activity recommendation:  As advised  Discharge Instructions    Discharge Instructions    Amb Referral to Palliative Care   Complete by: As directed    Call MD for:  difficulty breathing, headache or visual disturbances   Complete by: As directed    Call MD for:  persistant dizziness or light-headedness   Complete by: As directed    Call MD for:  persistant nausea and vomiting   Complete by: As directed    Call MD for:  severe uncontrolled pain   Complete by: As directed    Call MD for:  temperature  >100.4   Complete by: As directed    Diet - low sodium heart healthy   Complete by: As directed    Discharge instructions   Complete by: As directed    1) take lactulose as prescribed with a goal of having 2-3 mushy but not watery bowel movements per day 2) keep your appointment with your oncologist Dr. Learta Codding for 07/11/20--to discuss further treatment options for progressive cancer 3) please take Keflex antibiotics as prescribed for urinary tract infection 4) please drink plenty fluids to avoid dehydration 5) repeat CBC and CMP test advised next week around Wednesday, 07/03/2020 6)Avoid ibuprofen/Advil/Aleve/Motrin/Goody Powders/Naproxen/BC powders/Meloxicam/Diclofenac/Indomethacin and other Nonsteroidal anti-inflammatory medications as these will make you more likely to bleed and can cause stomach ulcers, can also cause Kidney problems.   Increase activity slowly   Complete by: As directed         Discharge Medications     Allergies as of 06/28/2020      Reactions   Vasotec [enalapril] Cough      Medication List    STOP taking these medications   amLODipine 10 MG tablet Commonly known as: NORVASC   metoprolol succinate 50 MG 24 hr tablet Commonly known as: TOPROL-XL   phenazopyridine 200 MG tablet Commonly known as: Pyridium     TAKE these medications   atenolol 25 MG tablet Commonly known as: Tenormin Take 1 tablet (25 mg total) by mouth daily.   cephALEXin 500 MG capsule Commonly known as: Keflex Take 1 capsule (500 mg total) by mouth 3 (three) times daily for 3 days.   lactulose (encephalopathy) 10 GM/15ML Soln Commonly known as: CHRONULAC Take 15 mLs (10 g total) by mouth daily. May take additional dose each day if no bowel movement   potassium chloride SA 20 MEQ tablet Commonly known as: KLOR-CON TAKE 1 TABLET(20 MEQ) BY MOUTH TWICE DAILY What changed: See the new instructions.   tamsulosin 0.4 MG Caps capsule Commonly known as: FLOMAX Take 1 capsule  (0.4 mg total) by mouth at bedtime.            Durable Medical Equipment  (From admission, onward)         Start     Ordered   06/27/20 1251  For home use only DME Shower stool  Once       Comments: Ambulatory dysfunction   06/27/20 1251   06/27/20 1138  For home use only DME Walker rolling  Once       Question Answer Comment  Walker: With 5 Inch  Wheels   Patient needs a walker to treat with the following condition Ambulatory dysfunction      06/27/20 1138          Major procedures and Radiology Reports - PLEASE review detailed and final reports for all details, in brief -    CT Head Wo Contrast  Result Date: 06/26/2020 CLINICAL DATA:  Hypotension and lethargy EXAM: CT HEAD WITHOUT CONTRAST TECHNIQUE: Contiguous axial images were obtained from the base of the skull through the vertex without intravenous contrast. COMPARISON:  None. FINDINGS: Brain: Diffuse atrophic changes are noted. Findings of chronic white matter ischemic change are seen. No findings to suggest acute hemorrhage, acute infarction or space-occupying mass lesion are noted. Vascular: No hyperdense vessel or unexpected calcification. Skull: Normal. Negative for fracture or focal lesion. Sinuses/Orbits: No acute finding. Other: None. IMPRESSION: Chronic atrophic and ischemic changes without acute abnormality. Electronically Signed   By: Inez Catalina M.D.   On: 06/26/2020 01:03   CT Abdomen Pelvis W Contrast  Result Date: 06/26/2020 CLINICAL DATA:  Known metastatic cecal carcinoma with abdominal distension EXAM: CT ABDOMEN AND PELVIS WITH CONTRAST TECHNIQUE: Multidetector CT imaging of the abdomen and pelvis was performed using the standard protocol following bolus administration of intravenous contrast. CONTRAST:  163mL OMNIPAQUE IOHEXOL 300 MG/ML  SOLN COMPARISON:  04/05/2020 PET-CT FINDINGS: Lower chest: No acute abnormality. Hepatobiliary: Multiple enhancing lesions are noted throughout the liver consistent with  metastatic disease. Largest of these measures 16 cm in dimension. Gallbladder is within normal limits. Changes of prior embolotherapy in the gastro duodenal artery are seen. Pancreas: Unremarkable. No pancreatic ductal dilatation or surrounding inflammatory changes. Spleen: Normal in size without focal abnormality. Adrenals/Urinary Tract: Adrenal glands are within normal limits. Kidneys demonstrate a normal enhancement pattern bilaterally. Normal excretion is noted from the kidneys bilaterally. The bladder is decompressed Stomach/Bowel: Changes of prior right colonic surgery are noted. Ileocolic anastomosis is noted without obstructive change. Vascular/Lymphatic: Aortic atherosclerosis. No enlarged abdominal or pelvic lymph nodes. Reproductive: Prostate is mildly prominent indenting upon the bladder. This is stable from prior PET-CT. Other: No abdominal wall hernia or abnormality. No abdominopelvic ascites. Musculoskeletal: Increased sclerosis is noted within the sacrum which corresponds to a metastatic lesion seen on prior PET-CT. Some scattered sclerotic lesions are noted within the spine consistent with metastatic disease. IMPRESSION: Changes consistent with diffuse hepatic metastatic disease similar to that seen on prior PET-CT. Bony metastatic disease is noted. No acute abnormality is noted correspond with the patient's given clinical history. Electronically Signed   By: Inez Catalina M.D.   On: 06/26/2020 01:58   DG Chest Port 1 View  Result Date: 06/25/2020 CLINICAL DATA:  Weakness EXAM: PORTABLE CHEST 1 VIEW COMPARISON:  09/23/2011 FINDINGS: Cardiac shadow is at the upper limits of normal in size. The lungs are well aerated bilaterally. No focal infiltrate is seen. No bony abnormality is noted. IMPRESSION: No acute abnormality noted. Electronically Signed   By: Inez Catalina M.D.   On: 06/25/2020 22:00    Micro Results   Recent Results (from the past 240 hour(s))  Culture, blood (routine x 2)      Status: None (Preliminary result)   Collection Time: 06/25/20  9:40 PM   Specimen: Left Antecubital; Blood  Result Value Ref Range Status   Specimen Description LEFT ANTECUBITAL  Final   Special Requests   Final    BOTTLES DRAWN AEROBIC AND ANAEROBIC Blood Culture adequate volume   Culture   Final    NO  GROWTH 3 DAYS Performed at [facility], [address] [place], [place] [address]    Report Status PENDING  Incomplete  Culture, blood (routine x 2)     Status: None (Preliminary result)   Collection Time: 06/25/20  9:40 PM   Specimen: BLOOD RIGHT WRIST  Result Value Ref Range Status   Specimen Description BLOOD RIGHT WRIST  Final   Special Requests   Final    BOTTLES DRAWN AEROBIC AND ANAEROBIC Blood Culture adequate volume   Culture   Final    NO GROWTH 3 DAYS Performed at [facility], [address], [place], [place] [address]    Report Status PENDING  Incomplete  Urine culture     Status: Abnormal   Collection Time: 06/25/20 11:35 PM   Specimen: Urine, Clean Catch  Result Value Ref Range Status   Specimen Description   Final    URINE, CLEAN CATCH Performed at [facility], [address] [place], [place] [address]    Special Requests   Final    NONE Performed at [facility], [address] [place], [place] [address]    Culture >=100,000 COLONIES/mL KLEBSIELLA PNEUMONIAE (A)  Final   Report Status 06/28/2020 FINAL  Final   Organism ID, Bacteria KLEBSIELLA PNEUMONIAE (A)  Final      Susceptibility   Klebsiella pneumoniae - MIC*    AMPICILLIN >=32 RESISTANT Resistant     CEFAZOLIN <=4 SENSITIVE Sensitive     CEFTRIAXONE <=0.25 SENSITIVE Sensitive     CIPROFLOXACIN <=0.25 SENSITIVE Sensitive     GENTAMICIN <=1 SENSITIVE Sensitive     IMIPENEM <=0.25 SENSITIVE Sensitive     NITROFURANTOIN 64 INTERMEDIATE Intermediate     TRIMETH/SULFA <=20 SENSITIVE Sensitive     AMPICILLIN/SULBACTAM 4 SENSITIVE Sensitive     PIP/TAZO 16 SENSITIVE Sensitive     * >=100,000  COLONIES/mL KLEBSIELLA PNEUMONIAE  SARS Coronavirus 2 by RT PCR (hospital order, performed in [place] hospital lab) Nasopharyngeal Nasopharyngeal Swab     Status: None   Collection Time: 06/26/20  2:25 AM   Specimen: Nasopharyngeal Swab  Result Value Ref Range Status   SARS Coronavirus 2 NEGATIVE NEGATIVE Final    Comment: (NOTE) SARS-CoV-2 target nucleic acids are NOT DETECTED.  The SARS-CoV-2 RNA is generally detectable in upper and lower respiratory specimens during the acute phase of infection. The lowest concentration of SARS-CoV-2 viral copies this assay can detect is 250 copies / mL. A negative result does not preclude SARS-CoV-2 infection and should not be used as the sole basis for treatment or other patient management decisions.  A negative result may occur with improper specimen collection / handling, submission of specimen other than nasopharyngeal swab, presence of viral mutation(s) within the areas targeted by this assay, and inadequate number of viral copies (<250 copies / mL). A negative result must be combined with clinical observations, patient history, and epidemiological information.  Fact Sheet for Patients:   StrictlyIdeas.no  Fact Sheet for Healthcare Providers: BankingDealers.co.za  This test is not yet approved or  cleared by the [place] FDA and has been authorized for detection and/or diagnosis of SARS-CoV-2 by FDA under an Emergency Use Authorization (EUA).  This EUA will remain in effect (meaning this test can be used) for the duration of the COVID-19 declaration under Section 564(b)(1) of the Act, 21 U.S.C. section 360bbb-3(b)(1), unless the authorization is terminated or revoked sooner.  Performed at [facility], [address] [place], [place] [address]     Today  Subjective    Tommy Conley today has no new complaints -According to patient's daughter Kristine Garbe mental status has  improved significantly,  --Much more oriented and much less confused   Patient has been seen and examined prior to discharge   Objective   Blood pressure 111/70, pulse 85, temperature 98 F (36.7 C), temperature source Oral, resp. rate 20, height 6\' 1"  (1.854 m), weight 95.3 kg, SpO2 100 %.   Intake/Output Summary (Last 24 hours) at 06/28/2020 1611 Last data filed at 06/28/2020 1200 Gross per 24 hour  Intake 1525.97 ml  Output 500 ml  Net 1025.97 ml    Exam Gen:- Awake Alert, no acute distress , more appropriate HEENT:- [place].AT, No sclera icterus Neck-Supple Neck,No JVD,.  Lungs-  CTAB , good air movement bilaterally  CV- S1, S2 normal, regular Abd-  +ve B.Sounds, Abd Soft, No tenderness, no CVA area tenderness  extremity/Skin:- No  edema,   good pulses Psych-affect is more appropriate, oriented   Neuro-generalized weakness, no new focal deficits, no tremors    Data Review   CBC w Diff:  Lab Results  Component Value Date   WBC 7.2 06/27/2020   HGB 12.0 (L) 06/27/2020   HGB 12.1 (L) 08/03/2019   HGB 13.8 08/11/2013   HCT 37.1 (L) 06/27/2020   HCT 42.0 08/11/2013   PLT 141 (L) 06/27/2020   PLT 136 (L) 08/03/2019   PLT 209 08/11/2013   LYMPHOPCT 19 06/25/2020   LYMPHOPCT 22.2 08/11/2013   MONOPCT 11 06/25/2020   MONOPCT 10.8 08/11/2013   EOSPCT 1 06/25/2020   EOSPCT 1.7 08/11/2013   BASOPCT 0 06/25/2020   BASOPCT 0.6 08/11/2013    CMP:  Lab Results  Component Value Date   NA 137 06/28/2020   NA 141 11/21/2019   NA 140 08/25/2016   K 4.2 06/28/2020   K 3.5 08/25/2016   CL 113 (H) 06/28/2020   CL 107 04/20/2013   CO2 17 (L) 06/28/2020   CO2 26 08/25/2016   BUN 28 (H) 06/28/2020   BUN 15 11/21/2019   BUN 10.0 08/25/2016   CREATININE 0.87 06/28/2020   CREATININE 1.03 05/16/2020   CREATININE 1.0 08/25/2016   PROT 5.3 (L) 06/27/2020   PROT 6.4 11/21/2019   PROT 7.3 08/25/2016   ALBUMIN 2.5 (L) 06/27/2020   ALBUMIN 4.0 11/21/2019   ALBUMIN 3.3 (L)  08/25/2016   BILITOT 0.9 06/27/2020   BILITOT 0.9 05/16/2020   BILITOT 0.81 08/25/2016   ALKPHOS 194 (H) 06/27/2020   ALKPHOS 161 (H) 08/25/2016   AST 29 06/27/2020   AST 40 05/16/2020   AST 22 08/25/2016   ALT 31 06/27/2020   ALT 34 05/16/2020   ALT 14 08/25/2016  .   Total Discharge time is about 33 minutes  Roxan Hockey M.D on 06/28/2020 at 4:11 PM  Go to [email or web address] -  for contact info  [facility] - Office  [phone]

## 2020-07-01 ENCOUNTER — Telehealth: Payer: Self-pay | Admitting: Family Medicine

## 2020-07-01 ENCOUNTER — Telehealth: Payer: Self-pay | Admitting: *Deleted

## 2020-07-01 LAB — CULTURE, BLOOD (ROUTINE X 2)
Culture: NO GROWTH
Culture: NO GROWTH
Special Requests: ADEQUATE
Special Requests: ADEQUATE

## 2020-07-01 NOTE — Telephone Encounter (Addendum)
Daughter called requesting an appointment with Dr. Benay Spice asap. Just left hospital on 06/28/20 for UTI, dehydration and metabolic encephalopathy. Is more alert now and eating some. He is very winded with just walking to bathroom. Friday night has started having bright red blood with every soft BM at least 2/day (put on Lactulose on discharge). Not having any rectal pain, but some mild intermittent abdominal pain. Per Dr. Benay Spice: Needs to f/u with his PCP. None of this sound like it is related to his carcinoid diagnosis. CT scan is relatively unchanged from PET in April. Called daughter back w/MD response and suggested they get him in with PCP this week. Welcome to have PCP call Dr. Benay Spice if he has any oncology concerns after seeing him.

## 2020-07-01 NOTE — Telephone Encounter (Signed)
Verbal order given to Arbie Cookey at [facility]

## 2020-07-01 NOTE — Telephone Encounter (Signed)
Yes, pls give verbal order for the skilled nursing. Thx. Dr. Lovena Le

## 2020-07-01 NOTE — Telephone Encounter (Signed)
Pt is needing approval for skilled nursing 2x week for 3 weeks.   [place]

## 2020-07-02 ENCOUNTER — Telehealth: Payer: Self-pay | Admitting: *Deleted

## 2020-07-02 NOTE — Telephone Encounter (Signed)
Pt's daughter called and states pt has been having blood in about every other stool and it is a lot. He is very weak, sleeping a lot. Started Friday after being discharged from hospital. 2 days ago started getting winded when walking. Advised he should go back to hospital.  Daughter states she will take him to [place].

## 2020-07-02 NOTE — Telephone Encounter (Signed)
Yes, agree thx.  Dr. Lovena Le

## 2020-07-03 ENCOUNTER — Telehealth: Payer: Self-pay | Admitting: Family Medicine

## 2020-07-03 NOTE — Telephone Encounter (Signed)
Yes, can have verbal order for the PT and other care. Thx. Dr. Lovena Le

## 2020-07-03 NOTE — Telephone Encounter (Signed)
Tommy Conley from [facility] needing  Verbal orders for one  Week one for physical therapy and 2 weeks  For 4. Alos needing orders for perative care.

## 2020-07-03 NOTE — Telephone Encounter (Signed)
Ben contacted [phone]) and verbalized understanding

## 2020-07-03 NOTE — Telephone Encounter (Signed)
Please advise. Thank you

## 2020-07-04 ENCOUNTER — Telehealth: Payer: Self-pay | Admitting: Family Medicine

## 2020-07-04 NOTE — Telephone Encounter (Signed)
Pt discharged from hospital on 7/9 and discharge diagnosis below: Discharge Diagnosis  Dehydration [E86.0] Generalized weakness [R53.1] AKI (acute kidney injury) ([place]) [N17.9] Altered mental status, unspecified altered mental status type [V44.45] Acute metabolic encephalopathy [E48.35]

## 2020-07-04 NOTE — Telephone Encounter (Signed)
Jessica from [facility] called and needs verbal for Speech Eval and CBC and CMP

## 2020-07-05 ENCOUNTER — Other Ambulatory Visit: Payer: Self-pay

## 2020-07-05 ENCOUNTER — Encounter (HOSPITAL_COMMUNITY): Payer: Self-pay | Admitting: *Deleted

## 2020-07-05 ENCOUNTER — Telehealth: Payer: Self-pay | Admitting: Family Medicine

## 2020-07-05 ENCOUNTER — Emergency Department (HOSPITAL_COMMUNITY)
Admission: EM | Admit: 2020-07-05 | Discharge: 2020-07-05 | Disposition: A | Discharge status: Home / Self Care | Payer: Medicare Other | Attending: Emergency Medicine | Admitting: Emergency Medicine

## 2020-07-05 DIAGNOSIS — F17210 Nicotine dependence, cigarettes, uncomplicated: Secondary | ICD-10-CM | POA: Diagnosis not present

## 2020-07-05 DIAGNOSIS — R5383 Other fatigue: Secondary | ICD-10-CM | POA: Diagnosis not present

## 2020-07-05 DIAGNOSIS — C7919 Secondary malignant neoplasm of other urinary organs: Secondary | ICD-10-CM | POA: Diagnosis not present

## 2020-07-05 DIAGNOSIS — I10 Essential (primary) hypertension: Secondary | ICD-10-CM | POA: Insufficient documentation

## 2020-07-05 DIAGNOSIS — C799 Secondary malignant neoplasm of unspecified site: Secondary | ICD-10-CM

## 2020-07-05 DIAGNOSIS — R531 Weakness: Secondary | ICD-10-CM | POA: Diagnosis present

## 2020-07-05 DIAGNOSIS — Z79899 Other long term (current) drug therapy: Secondary | ICD-10-CM | POA: Insufficient documentation

## 2020-07-05 DIAGNOSIS — I959 Hypotension, unspecified: Secondary | ICD-10-CM | POA: Insufficient documentation

## 2020-07-05 LAB — SAMPLE TO BLOOD BANK

## 2020-07-05 LAB — CBC WITH DIFFERENTIAL/PLATELET
Abs Immature Granulocytes: 0.03 K/uL (ref 0.00–0.07)
Basophils Absolute: 0 K/uL (ref 0.0–0.1)
Basophils Relative: 1 %
Eosinophils Absolute: 0 K/uL (ref 0.0–0.5)
Eosinophils Relative: 0 %
HCT: 39.9 % (ref 39.0–52.0)
Hemoglobin: 12.9 g/dL — ABNORMAL LOW (ref 13.0–17.0)
Immature Granulocytes: 0 %
Lymphocytes Relative: 18 %
Lymphs Abs: 1.3 K/uL (ref 0.7–4.0)
MCH: 29.6 pg (ref 26.0–34.0)
MCHC: 32.3 g/dL (ref 30.0–36.0)
MCV: 91.5 fL (ref 80.0–100.0)
Monocytes Absolute: 0.6 K/uL (ref 0.1–1.0)
Monocytes Relative: 9 %
Neutro Abs: 5.2 K/uL (ref 1.7–7.7)
Neutrophils Relative %: 72 %
Platelets: 171 K/uL (ref 150–400)
RBC: 4.36 MIL/uL (ref 4.22–5.81)
RDW: 16.5 % — ABNORMAL HIGH (ref 11.5–15.5)
WBC: 7.2 K/uL (ref 4.0–10.5)
nRBC: 0 % (ref 0.0–0.2)

## 2020-07-05 LAB — COMPREHENSIVE METABOLIC PANEL
ALT: 58 U/L — ABNORMAL HIGH (ref 0–44)
AST: 43 U/L — ABNORMAL HIGH (ref 15–41)
Albumin: 2.6 g/dL — ABNORMAL LOW (ref 3.5–5.0)
Alkaline Phosphatase: 286 U/L — ABNORMAL HIGH (ref 38–126)
Anion gap: 8 (ref 5–15)
BUN: 27 mg/dL — ABNORMAL HIGH (ref 8–23)
CO2: 17 mmol/L — ABNORMAL LOW (ref 22–32)
Calcium: 8.7 mg/dL — ABNORMAL LOW (ref 8.9–10.3)
Chloride: 111 mmol/L (ref 98–111)
Creatinine, Ser: 1.2 mg/dL (ref 0.61–1.24)
GFR calc Af Amer: >60 mL/min (ref >60)
GFR calc non Af Amer: >60 mL/min (ref >60)
Glucose, Bld: 99 mg/dL (ref 70–99)
Potassium: 4.3 mmol/L (ref 3.5–5.1)
Sodium: 136 mmol/L (ref 135–145)
Total Bilirubin: 1.1 mg/dL (ref 0.3–1.2)
Total Protein: 5.8 g/dL — ABNORMAL LOW (ref 6.5–8.1)

## 2020-07-05 LAB — PROTIME-INR
INR: 1.3 — ABNORMAL HIGH (ref 0.8–1.2)
Prothrombin Time: 15.2 s (ref 11.4–15.2)

## 2020-07-05 LAB — URINALYSIS, ROUTINE W REFLEX MICROSCOPIC
Bacteria, UA: NONE SEEN
Bilirubin Urine: NEGATIVE
Glucose, UA: NEGATIVE mg/dL
Ketones, ur: NEGATIVE mg/dL
Nitrite: NEGATIVE
Protein, ur: 100 mg/dL — AB
RBC / HPF: >50 RBC/hpf — ABNORMAL HIGH (ref 0–5)
Specific Gravity, Urine: 1.016 (ref 1.005–1.030)
WBC, UA: >50 WBC/hpf — ABNORMAL HIGH (ref 0–5)
pH: 6 (ref 5.0–8.0)

## 2020-07-05 LAB — TROPONIN I (HIGH SENSITIVITY)
Troponin I (High Sensitivity): 9 ng/L (ref <18)
Troponin I (High Sensitivity): 11 ng/L (ref <18)

## 2020-07-05 LAB — LACTIC ACID, PLASMA
Lactic Acid, Venous: 2.4 mmol/L (ref 0.5–1.9)
Lactic Acid, Venous: 2 mmol/L (ref 0.5–1.9)

## 2020-07-05 LAB — AMMONIA: Ammonia: 52 umol/L — ABNORMAL HIGH (ref 9–35)

## 2020-07-05 MED ORDER — LACTATED RINGERS IV BOLUS
1000.0000 mL | Freq: Once | INTRAVENOUS | Status: AC
  Administered 2020-07-05: 1000 mL via INTRAVENOUS

## 2020-07-05 NOTE — Telephone Encounter (Signed)
Ok, yes give verbal orders. Thx. Dr .Lovena Le

## 2020-07-05 NOTE — ED Triage Notes (Signed)
Pt found lying on front porch after not feeling well, found his BP was hypotensive, iv established and 500 cc bolus given en route.  Pt did c/o dizziness before eased to ground.  Pt with recent dx of colon CA and was on way to an appt.  CBG 116.  Reported no change in BP after bolus.

## 2020-07-05 NOTE — ED Notes (Signed)
Date and time results received: 07/05/20 8:36 PM  Test: Lactic Acid Critical Value: 2.4  Name of Provider Notified: Davonna Belling, MD  Orders Received? Or Actions Taken?: Actions Taken: n/a

## 2020-07-05 NOTE — Discharge Instructions (Addendum)
Watch for worsening mental status or increased bleeding.  Follow-up with his doctors on Monday if possible.  Return for worsening symptoms.

## 2020-07-05 NOTE — Telephone Encounter (Signed)
Verbal order given to Kat at home health. Patient currently at ER for evaluation

## 2020-07-05 NOTE — Telephone Encounter (Signed)
Yes, in agreement, pt needing to go to ER for falls, weakness, losing blood.  Dr. Lovena Le

## 2020-07-05 NOTE — Telephone Encounter (Signed)
[facility]

## 2020-07-05 NOTE — ED Provider Notes (Signed)
[place] Provider Note   CSN: [id number] Arrival date & time: 07/05/20  1511     History Chief Complaint  Patient presents with  . Hypotension    Tommy Conley is a 69 y.o. male.  HPI Patient brought in for weakness and hypotension.  Reportedly lying on front porch not feeling well.  Patient states he feels better and thinks he could go home now.  Reportedly had pressures in the 80s.  Given fluid bolus with improvement.  Reportedly has had dizziness.  Has history of metastatic colon cancer.  Reports going to see Dr. Today.  Reportedly reviewed notes has had blood in the stool.  Patient states he does not know about this but maybe has had a little with wiping.  No real abdominal pain.  States his appetite has been okay.  Denies chest pain.  Denies fever.  Recently been seen for similar symptoms diagnosed with a UTI and he may or may not still be on antibiotics.    Past Medical History:  Diagnosis Date  . Allergy   . Barrett's esophagus   . BPH (benign prostatic hyperplasia)   . Carcinoid tumor of cecum   . Colon polyp   . Complication of anesthesia 2010   woke up during radiation pellet insertion  . Foley catheter in place last 3- 4 weeks  . GERD (gastroesophageal reflux disease)   . Heart murmur    "had all my left" no cardiologist currently had checked 3-4 times in past  . Hypertension   . IBS (irritable bowel syndrome)   . Malignant carcinoid tumors of other sites [facility]) 08/25/2016   colon  . Perennial allergic rhinitis     Patient Active Problem List   Diagnosis Date Noted  . Acute hepatic encephalopathy 06/28/2020  . Acute metabolic encephalopathy due to GNR UTI 06/27/2020  . Acute lower GNR UTI 06/27/2020  . Generalized weakness 06/26/2020  . Dehydration 06/26/2020  . AKI (acute kidney injury) ([place]) 06/26/2020  . BPH without urinary obstruction 05/13/2020  . Malignant carcinoid tumors of other sites ([place]) 08/25/2016  . Rectal pain  05/05/2016  . Irritable bowel syndrome 04/29/2015  . Essential hypertension, benign 04/24/2013  . Erectile dysfunction 04/24/2013  . Esophageal reflux 04/24/2013  . 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 colectomy  09/29/11   lap - right with liver bx  . COLON SURGERY  2017  . COLONOSCOPY  07/2011  . radioembolization right liver  2016  . TRANSURETHRAL RESECTION OF PROSTATE N/A 05/13/2020   Procedure: TRANSURETHRAL RESECTION OF THE PROSTATE (TURP);  Surgeon: Kathie Rhodes, MD;  Location: [facility];  Service: Urology;  Laterality: N/A;  . UPPER GASTROINTESTINAL ENDOSCOPY     hx barrett's esophagus  . WEDGE LIVER BIOPSY  09/29/11   right liver lesion       Family History  Problem Relation Age of Onset  . Hypertension Father   . Hyperlipidemia Father   . Heart attack Father   . Colon polyps Neg Hx   . Colon cancer Neg Hx   . Stomach cancer Neg Hx   . Cancer Neg Hx   . Esophageal cancer Neg Hx   . Pancreatic cancer Neg Hx   . Rectal cancer Neg Hx     Social History   Tobacco Use  . Smoking status: Light Tobacco Smoker    Packs/day:  0.25    Years: 17.00    Pack years: 4.25    Types: Cigarettes  . Smokeless tobacco: Never Used  . Tobacco comment: does not smoke every day  Vaping Use  . Vaping Use: Never used  Substance Use Topics  . Alcohol use: No  . Drug use: No    Home Medications Prior to Admission medications   Medication Sig Start Date End Date Taking? Authorizing Provider  atenolol (TENORMIN) 25 MG tablet Take 1 tablet (25 mg total) by mouth daily. 06/28/20 06/28/21  Roxan Hockey, MD  lactulose, encephalopathy, (CHRONULAC) 10 GM/15ML SOLN Take 15 mLs (10 g total) by mouth daily. May take additional dose each day if no bowel movement 06/28/20   Roxan Hockey, MD  potassium chloride SA (KLOR-CON) 20 MEQ tablet TAKE 1 TABLET(20 MEQ)  BY MOUTH TWICE DAILY Patient taking differently: Take 20 mEq by mouth 2 (two) times daily.  11/27/19   Ladell Pier, MD  tamsulosin (FLOMAX) 0.4 MG CAPS capsule Take 1 capsule (0.4 mg total) by mouth at bedtime. 06/28/20   Roxan Hockey, MD    Allergies    Vasotec [enalapril]  Review of Systems   Review of Systems  Constitutional: Positive for fatigue.  HENT: Negative for congestion.   Respiratory: Negative for shortness of breath.   Gastrointestinal: Negative for abdominal pain.  Genitourinary: Negative for flank pain.  Musculoskeletal: Negative for back pain.  Neurological: Positive for weakness.    Physical Exam Updated Vital Signs BP 90/65   Pulse 79   Temp (!) 97.4 F (36.3 C) (Oral)   Resp (!) 25   Ht 6\' 1"  (1.854 m)   Wt 95.3 kg   SpO2 100%   BMI 27.71 kg/m   Physical Exam Vitals and nursing note reviewed.  Constitutional:      Appearance: Normal appearance.  HENT:     Head: Normocephalic.  Cardiovascular:     Rate and Rhythm: Regular rhythm.  Pulmonary:     Breath sounds: No wheezing or rhonchi.  Abdominal:     Comments: Mild abdominal tenderness without rebound or guarding.  Musculoskeletal:        General: No signs of injury.  Skin:    General: Skin is warm.     Capillary Refill: Capillary refill takes less than 2 seconds.  Neurological:     Mental Status: He is alert and oriented to person, place, and time.     ED Results / Procedures / Treatments   Labs (all labs ordered are listed, but only abnormal results are displayed) Labs Reviewed  COMPREHENSIVE METABOLIC PANEL - Abnormal; Notable for the following components:      Result Value   CO2 17 (*)    BUN 27 (*)    Calcium 8.7 (*)    Total Protein 5.8 (*)    Albumin 2.6 (*)    AST 43 (*)    ALT 58 (*)    Alkaline Phosphatase 286 (*)    All other components within normal limits  LACTIC ACID, PLASMA - Abnormal; Notable for the following components:   Lactic Acid, Venous 2.4 (*)    All  other components within normal limits  LACTIC ACID, PLASMA - Abnormal; Notable for the following components:   Lactic Acid, Venous 2.0 (*)    All other components within normal limits  CBC WITH DIFFERENTIAL/PLATELET - Abnormal; Notable for the following components:   Hemoglobin 12.9 (*)    RDW 16.5 (*)    All other components  within normal limits  URINALYSIS, ROUTINE W REFLEX MICROSCOPIC - Abnormal; Notable for the following components:   Color, Urine AMBER (*)    APPearance CLOUDY (*)    Hgb urine dipstick LARGE (*)    Protein, ur 100 (*)    Leukocytes,Ua SMALL (*)    RBC / HPF >50 (*)    WBC, UA >50 (*)    All other components within normal limits  AMMONIA - Abnormal; Notable for the following components:   Ammonia 52 (*)    All other components within normal limits  PROTIME-INR - Abnormal; Notable for the following components:   INR 1.3 (*)    All other components within normal limits  SAMPLE TO BLOOD BANK  TROPONIN I (HIGH SENSITIVITY)  TROPONIN I (HIGH SENSITIVITY)    EKG EKG Interpretation  Date/Time:  Friday July 05 2020 15:48:33 EDT Ventricular Rate:  94 PR Interval:    QRS Duration: 95 QT Interval:  374 QTC Calculation: 468 R Axis:   91 Text Interpretation: Sinus rhythm Multiform ventricular premature complexes Right axis deviation Baseline wander in lead(s) V5 Confirmed by Davonna Belling [phone]) on 07/05/2020 4:11:23 PM   Radiology No results found.  Procedures Procedures (including critical care time)  Medications Ordered in ED Medications  lactated ringers bolus 1,000 mL (0 mLs Intravenous Stopped 07/05/20 2037)  lactated ringers bolus 1,000 mL (1,000 mLs Intravenous New Bag/Given 07/05/20 2040)    ED Course  I have reviewed the triage vital signs and the nursing notes.  Pertinent labs & imaging results that were available during my care of the patient were reviewed by me and considered in my medical decision making (see chart for details).      MDM Rules/Calculators/A&P                          Patient presented with hypotension and weakness.  Reportedly has had some GI bleeding also.  However hemoglobin is higher than it was when last checked.  Does have known metastatic cancer to his liver.  Ammonia mildly elevated.  Discussed with patient and his family member.  He would rather go home and I think it is reasonable although higher risk than coming in to the hospital.  Will watch for more bleeding.  Will try and keep himself hydrated.  Will follow up with his doctors on Monday and will return sooner if symptoms worsen. Final Clinical Impression(s) / ED Diagnoses Final diagnoses:  Hypotension, unspecified hypotension type  Metastatic malignant neoplasm, unspecified site [facility])    Rx / [place] Orders ED Discharge Orders    None       Davonna Belling, MD 07/05/20 2118

## 2020-07-05 NOTE — Telephone Encounter (Signed)
[facility]-needing verbal orders for occupational therapy for 1 time a week for a week and 2 times a week for 4 weeks. Nurse states patient is still passing blood,very weak,confusion,dizziness and Blood pressure 98/70. Also patient fell yesterday but denies any injuries. And nurse Wendelyn Breslow told him he needed to ER. [phone]

## 2020-07-08 ENCOUNTER — Telehealth: Payer: Self-pay | Admitting: Family Medicine

## 2020-07-08 NOTE — Telephone Encounter (Signed)
Script written and await signature

## 2020-07-08 NOTE — Telephone Encounter (Signed)
Judy notified.

## 2020-07-08 NOTE — Telephone Encounter (Signed)
Tommy Conley at home health stated they did not draw labs that were  ordered on Patient today because he had those labs drawn Friday in ER.  Prescription for bedside commode faxed to [facility].

## 2020-07-08 NOTE — Telephone Encounter (Signed)
[facility] needing prescription for bedside commode . She states patient had two falls this weekend no injuries. [phone]

## 2020-07-08 NOTE — Telephone Encounter (Signed)
Left message for Judy to return call

## 2020-07-08 NOTE — Telephone Encounter (Signed)
Pls fill out the script for the commode.  Thx. Dr. Lovena Le

## 2020-07-08 NOTE — Telephone Encounter (Signed)
Yes pls give orders thx. Dr. Lovena Le

## 2020-07-08 NOTE — Telephone Encounter (Signed)
Need Verbal for 2 x week for 4 weeks  for swallow issues  Consult for Social Worker also   Tommy Conley call back [phone] Tommy Conley

## 2020-07-11 ENCOUNTER — Encounter: Payer: Self-pay | Admitting: Nurse Practitioner

## 2020-07-11 ENCOUNTER — Inpatient Hospital Stay: Payer: Medicare Other

## 2020-07-11 ENCOUNTER — Telehealth: Payer: Self-pay | Admitting: Family Medicine

## 2020-07-11 ENCOUNTER — Inpatient Hospital Stay: Payer: Medicare Other | Attending: Nurse Practitioner | Admitting: Nurse Practitioner

## 2020-07-11 ENCOUNTER — Other Ambulatory Visit: Payer: Self-pay

## 2020-07-11 ENCOUNTER — Telehealth: Payer: Self-pay

## 2020-07-11 ENCOUNTER — Inpatient Hospital Stay: Admission: AD | Admit: 2020-07-11 | Payer: Medicare Other | Source: Ambulatory Visit | Admitting: Internal Medicine

## 2020-07-11 VITALS — BP 105/71 | HR 103 | Temp 97.6°F | Resp 17 | Ht 73.0 in | Wt 158.5 lb

## 2020-07-11 DIAGNOSIS — C7A098 Malignant carcinoid tumors of other sites: Secondary | ICD-10-CM | POA: Diagnosis not present

## 2020-07-11 LAB — CMP (CANCER CENTER ONLY)
ALT: 51 U/L — ABNORMAL HIGH (ref 0–44)
AST: 39 U/L (ref 15–41)
Albumin: 3.2 g/dL — ABNORMAL LOW (ref 3.5–5.0)
Alkaline Phosphatase: 280 U/L — ABNORMAL HIGH (ref 38–126)
Anion gap: 8 (ref 5–15)
BUN: 30 mg/dL — ABNORMAL HIGH (ref 8–23)
CO2: 19 mmol/L — ABNORMAL LOW (ref 22–32)
Calcium: 9.1 mg/dL (ref 8.9–10.3)
Chloride: 108 mmol/L (ref 98–111)
Creatinine: 1.32 mg/dL — ABNORMAL HIGH (ref 0.61–1.24)
GFR, Est AFR Am: >60 mL/min (ref >60)
GFR, Estimated: 55 mL/min — ABNORMAL LOW (ref >60)
Glucose, Bld: 125 mg/dL — ABNORMAL HIGH (ref 70–99)
Potassium: 3.2 mmol/L — ABNORMAL LOW (ref 3.5–5.1)
Sodium: 135 mmol/L (ref 135–145)
Total Bilirubin: 1.2 mg/dL (ref 0.3–1.2)
Total Protein: 6.4 g/dL — ABNORMAL LOW (ref 6.5–8.1)

## 2020-07-11 LAB — CBC WITH DIFFERENTIAL (CANCER CENTER ONLY)
Abs Immature Granulocytes: 0.02 10*3/uL (ref 0.00–0.07)
Basophils Absolute: 0 10*3/uL (ref 0.0–0.1)
Basophils Relative: 0 %
Eosinophils Absolute: 0 10*3/uL (ref 0.0–0.5)
Eosinophils Relative: 0 %
HCT: 39.9 % (ref 39.0–52.0)
Hemoglobin: 13.4 g/dL (ref 13.0–17.0)
Immature Granulocytes: 0 %
Lymphocytes Relative: 27 %
Lymphs Abs: 2 10*3/uL (ref 0.7–4.0)
MCH: 29.6 pg (ref 26.0–34.0)
MCHC: 33.6 g/dL (ref 30.0–36.0)
MCV: 88.1 fL (ref 80.0–100.0)
Monocytes Absolute: 0.7 10*3/uL (ref 0.1–1.0)
Monocytes Relative: 10 %
Neutro Abs: 4.6 10*3/uL (ref 1.7–7.7)
Neutrophils Relative %: 63 %
Platelet Count: 186 10*3/uL (ref 150–400)
RBC: 4.53 MIL/uL (ref 4.22–5.81)
RDW: 16.1 % — ABNORMAL HIGH (ref 11.5–15.5)
WBC Count: 7.3 10*3/uL (ref 4.0–10.5)
nRBC: 0 % (ref 0.0–0.2)

## 2020-07-11 NOTE — Telephone Encounter (Signed)
Tommy Conley FL2 form droped off in the front by his social worker placed in [employer] for Dr Lovena Le

## 2020-07-11 NOTE — Progress Notes (Addendum)
[facility] OFFICE PROGRESS NOTE   Diagnosis: Carcinoid tumor  INTERVAL HISTORY:   Tommy Conley returns as scheduled.  He continues monthly Sandostatin.  He was hospitalized 06/25/2020 through 06/28/2020 with encephalopathy due to a UTI.  He was seen in the emergency department 07/05/2020 with hypotension and weakness.  He reported rectal bleeding.  He received IV fluids.  Hemoglobin was stable.  He was discharged home.  His daughter reports a decline in his condition over the past approximate 2 months.  Appetite is poor.  He is losing weight.  He has had several falls.  He is now living with his daughter.  She reports he remains in bed most of the day.  He is weak.  He has been having loose stools for at least 1 month.  He continues to have hematuria.  No fevers or sweats.  He denies rectal bleeding.  No pain.  No unusual headaches or vision change.  Objective:  Vital signs in last 24 hours:  Blood pressure 105/71, pulse 103, temperature 97.6 F (36.4 C), temperature source Temporal, resp. rate 17, height 6\' 1"  (1.854 m), weight 158 lb 8 oz (71.9 kg), SpO2 100 %.   Chronically ill-appearing. HEENT: Sclera anicteric.  Pupils are small, equal round and reactive to light.  Extraocular movements intact. Resp: Lungs are clear. Cardio: Regular rate and rhythm. GI: Abdomen is nontender.  No hepatomegaly. Vascular: Trace edema at the lower legs/ankles bilaterally.  Right ankle appears slightly more edematous than the left. Neuro: He is sleepy.  Arouses to verbal stimuli.  Follows commands.  Moves all extremities.  Bilateral leg weakness proximally.   Lab Results:  Lab Results  Component Value Date   WBC 7.3 07/11/2020   HGB 13.4 07/11/2020   HCT 39.9 07/11/2020   MCV 88.1 07/11/2020   PLT 186 07/11/2020   NEUTROABS 4.6 07/11/2020    Imaging:  No results found.  Medications: I have reviewed the patient's current medications.  Assessment/Plan: 1. Metastatic carcinoid  tumor with a primary well-differentiated neuroendocrine carcinoma involving the terminal ileum, 3 cm.  1. Status post a right colon/ileum resection on 09/29/2011.  2. Wedge biopsy of a right liver lesion on 09/29/2011 confirmed metastatic well-differentiated neuroendocrine carcinoma.  3. CT of the abdomen 08/25/2011 consistent with multiple liver metastases.  4. Elevated preoperative chromogranin A level and a 24-hour urine 5-HIAA level. Stable on repeat 11/16/2011. 5. Restaging CT on 12/28/2011 confirmed slight progression of the metastatic liver lesions. 6. Status post 3 cycles of Xeloda/temozolomide with cycle 3 initiated on 03/12/2012 7. Restaging CT of the abdomen 04/04/2012 revealed slight progression of the metastatic liver lesions and transverse mesocolon adenopathy. 8. Status post Y-90 radioembolization of the right liver on 05/19/2012. 9. Status post Y-90 radioembolization of the left liver on 07/06/2012. 10. Chromogranin A level decreased on 08/29/2012, 24-hour urine 5 HIAA slightly increased on 09/05/2012 11. Restaging CT on 12/05/2012 with a mixed response in the liver 12. Restaging CT 08/11/2013-mild increase in the size of liver lesions. 13. Restaging CT 01/22/2014-stable to slightly improved liver lesions, mild increase in mesenteric adenopathy. Chromogranin A level stable. 14. Chromogranin A level stable 05/01/2014. 15. Chromogranin A higher on 08/02/2014 16. Restaging CT 11/06/2014 with interval enlargement of the enhancing lesions within the left and right hepatic lobe. 17. Initiation of monthly Sandostatin 12/24/2014 18. Status post Y-90 radioembolization of the right liver on 01/03/2015 19. Chromogranin A level stable 02/18/2015 20. Chromogranin A level improved 08/12/2015 21. Restaging CT 09/11/2015 with mild  increase in the size of the dominant liver metastasis in the right hepatic lobe. Other smaller liver metastases showed no significant change. Stable mild  lymphadenopathy in central small bowel mesentery and right cardiophrenic angle. Stable subcm right middle lobe pulmonary nodule. 22. Monthly Sandostatin continued 23. Gallium-DOTATATE01/25/2018-increased activity of a dominant right liver mass and left liver lesion. Increased tracer accumulation in a mesenteric mass 24. Cycle 1Lutathera 03/30/2018 25. Cycle 2 Lutathera 05/25/2018 26. Cycle 3 Lutathera 07/20/2018 27. Cycle 4 Lutathera 09/14/2018 28. Restaging Netspot 10/18/2018-current scan very similar to pretherapy dotatate PET scan. No evidence of new or progressive neuroendocrine tumor. No enlarged tumor sites. 38. Netspot 04/05/2020-new FDG avid liver lesions, new bone metastases, enlarged prostate 2. History of diarrhea (3 to 4 times per day), predating surgery for 8 months. Decrease in daily bowel movements coinciding with discontinuation of Nexium. 3. Hypertension. 4. History of gastroesophageal reflux disease. No reflux symptoms since discontinuing Nexium. 5. Rectal and ascending colon polyps. He underwent a colonoscopy on 12/20/2012 with findings of a sessile polyp in the ascending colon and a sessile polyp in the rectum. Pathology showed hyperplastic polyps. Prior right colon resection noted with a normal appearing ileocolonic anastomosis. Small internal hemorrhoids noted. Repeat colonoscopy in 5 years. 6. History of Iron deficiency anemia 7. 08/12/2016 syncopal episode status post evaluation in the emergency department 8. Left-sided hearing loss July 2020 9. Urinary retention March 2021-followed at the urology center, TUR 05/13/2020  Disposition: Tommy Conley is a 69 year old man with metastatic carcinoid tumor with progression of disease on Netspot 04/05/2020.  He was referred to Dr. Ilda Foil to consider repeat treatment with Lutathera.  Monthly Sandostatin continued.  He presents to the office today for routine follow-up.  He has had a marked decline in his condition since last office  visit 05/16/2020.  He has lost a significant amount of weight.  He has an altered mental status.  Etiology of these changes is unclear.  He was hospitalized with similar symptoms 06/26/2020 through 06/28/2020.  Abdomen pelvis CT with diffuse hepatic metastatic disease similar to the recent Netspot.  Brain CT was without acute abnormality.  Ammonia level mildly elevated, discharged on lactulose.  Urine culture returned positive for Klebsiella, discharged home on cephalexin.  Arrangements are being made with the hospitalist at [facility] for admission.  Patient seen with Dr. Benay Spice.    Ned Card ANP/GNP-BC   07/11/2020  3:28 PM This was a shared visit with Ned Card.  Tommy Conley was interviewed and examined.  His daughter was present today.  He has experienced a marked decline in his performance status compared to when I saw him in May.  He was admitted 06/25/2020 with altered mental status.  He is confirmed to have a urinary tract infection.  Tommy Conley has lethargy and confusion.  He appears dehydrated.  He has lost a large amount of weight over the past several months.  He cannot care for himself at home.  I recommend hospital admission to further evaluate the altered mental status.  He may have hepatic encephalopathy related to the large liver tumor burden and underlying liver dysfunction.  He will need a physical therapy evaluation.  Recommendations: 1.  Hospital admission to evaluate his altered mental status and failure to thrive 2.  Physical therapy consult 3.  Consider GI consult to evaluate for hepatic encephalopathy 4.  Will give Sandostatin on 07/12/2020   Julieanne Manson, MD

## 2020-07-11 NOTE — Progress Notes (Signed)
Pt/daughter make the decision to go home await bed control phone number for admission reviewed with daughter if anything worsens or changes to bring him to ED daughter understands.

## 2020-07-11 NOTE — Telephone Encounter (Signed)
Received a request for Palliative care for patient through [facility]. Message sent to PCP to provide this update and confirm that PCP is in agreement.

## 2020-07-12 ENCOUNTER — Inpatient Hospital Stay (HOSPITAL_COMMUNITY)
Admission: AD | Admit: 2020-07-12 | Discharge: 2020-07-18 | DRG: 844 | Disposition: A | Discharge status: Skilled Nursing Facility | Payer: Medicare Other | Source: Ambulatory Visit | Attending: Internal Medicine | Admitting: Internal Medicine

## 2020-07-12 ENCOUNTER — Encounter (HOSPITAL_COMMUNITY): Payer: Self-pay | Admitting: Internal Medicine

## 2020-07-12 ENCOUNTER — Telehealth: Payer: Self-pay | Admitting: Nurse Practitioner

## 2020-07-12 ENCOUNTER — Inpatient Hospital Stay (HOSPITAL_COMMUNITY): Payer: Medicare Other

## 2020-07-12 DIAGNOSIS — R627 Adult failure to thrive: Secondary | ICD-10-CM | POA: Diagnosis present

## 2020-07-12 DIAGNOSIS — R338 Other retention of urine: Secondary | ICD-10-CM | POA: Diagnosis present

## 2020-07-12 DIAGNOSIS — E860 Dehydration: Secondary | ICD-10-CM | POA: Diagnosis present

## 2020-07-12 DIAGNOSIS — R4182 Altered mental status, unspecified: Secondary | ICD-10-CM | POA: Diagnosis present

## 2020-07-12 DIAGNOSIS — C7A1 Malignant poorly differentiated neuroendocrine tumors: Secondary | ICD-10-CM | POA: Diagnosis not present

## 2020-07-12 DIAGNOSIS — C7A098 Malignant carcinoid tumors of other sites: Secondary | ICD-10-CM | POA: Diagnosis not present

## 2020-07-12 DIAGNOSIS — Z6823 Body mass index (BMI) 23.0-23.9, adult: Secondary | ICD-10-CM

## 2020-07-12 DIAGNOSIS — Z20822 Contact with and (suspected) exposure to covid-19: Secondary | ICD-10-CM | POA: Diagnosis present

## 2020-07-12 DIAGNOSIS — C180 Malignant neoplasm of cecum: Secondary | ICD-10-CM | POA: Diagnosis present

## 2020-07-12 DIAGNOSIS — R319 Hematuria, unspecified: Secondary | ICD-10-CM | POA: Diagnosis present

## 2020-07-12 DIAGNOSIS — Z7189 Other specified counseling: Secondary | ICD-10-CM

## 2020-07-12 DIAGNOSIS — Z66 Do not resuscitate: Secondary | ICD-10-CM | POA: Diagnosis present

## 2020-07-12 DIAGNOSIS — D6959 Other secondary thrombocytopenia: Secondary | ICD-10-CM | POA: Diagnosis present

## 2020-07-12 DIAGNOSIS — D500 Iron deficiency anemia secondary to blood loss (chronic): Secondary | ICD-10-CM | POA: Diagnosis present

## 2020-07-12 DIAGNOSIS — C7A8 Other malignant neuroendocrine tumors: Secondary | ICD-10-CM | POA: Diagnosis present

## 2020-07-12 DIAGNOSIS — R7989 Other specified abnormal findings of blood chemistry: Secondary | ICD-10-CM | POA: Diagnosis not present

## 2020-07-12 DIAGNOSIS — K7290 Hepatic failure, unspecified without coma: Secondary | ICD-10-CM | POA: Diagnosis present

## 2020-07-12 DIAGNOSIS — H9192 Unspecified hearing loss, left ear: Secondary | ICD-10-CM | POA: Diagnosis present

## 2020-07-12 DIAGNOSIS — R6251 Failure to thrive (child): Secondary | ICD-10-CM | POA: Diagnosis present

## 2020-07-12 DIAGNOSIS — R296 Repeated falls: Secondary | ICD-10-CM | POA: Diagnosis present

## 2020-07-12 DIAGNOSIS — K219 Gastro-esophageal reflux disease without esophagitis: Secondary | ICD-10-CM | POA: Diagnosis present

## 2020-07-12 DIAGNOSIS — I10 Essential (primary) hypertension: Secondary | ICD-10-CM | POA: Diagnosis present

## 2020-07-12 DIAGNOSIS — N401 Enlarged prostate with lower urinary tract symptoms: Secondary | ICD-10-CM | POA: Diagnosis present

## 2020-07-12 DIAGNOSIS — C7A021 Malignant carcinoid tumor of the cecum: Secondary | ICD-10-CM | POA: Diagnosis not present

## 2020-07-12 DIAGNOSIS — C787 Secondary malignant neoplasm of liver and intrahepatic bile duct: Secondary | ICD-10-CM | POA: Diagnosis present

## 2020-07-12 DIAGNOSIS — R932 Abnormal findings on diagnostic imaging of liver and biliary tract: Secondary | ICD-10-CM | POA: Diagnosis not present

## 2020-07-12 DIAGNOSIS — E876 Hypokalemia: Secondary | ICD-10-CM | POA: Diagnosis present

## 2020-07-12 DIAGNOSIS — Z9049 Acquired absence of other specified parts of digestive tract: Secondary | ICD-10-CM

## 2020-07-12 DIAGNOSIS — N179 Acute kidney failure, unspecified: Secondary | ICD-10-CM | POA: Diagnosis present

## 2020-07-12 DIAGNOSIS — K529 Noninfective gastroenteritis and colitis, unspecified: Secondary | ICD-10-CM | POA: Diagnosis present

## 2020-07-12 DIAGNOSIS — R197 Diarrhea, unspecified: Secondary | ICD-10-CM

## 2020-07-12 DIAGNOSIS — C7A012 Malignant carcinoid tumor of the ileum: Secondary | ICD-10-CM | POA: Diagnosis not present

## 2020-07-12 DIAGNOSIS — C7B02 Secondary carcinoid tumors of liver: Secondary | ICD-10-CM | POA: Diagnosis not present

## 2020-07-12 DIAGNOSIS — Z9079 Acquired absence of other genital organ(s): Secondary | ICD-10-CM

## 2020-07-12 DIAGNOSIS — Z515 Encounter for palliative care: Secondary | ICD-10-CM

## 2020-07-12 DIAGNOSIS — Z923 Personal history of irradiation: Secondary | ICD-10-CM

## 2020-07-12 DIAGNOSIS — N400 Enlarged prostate without lower urinary tract symptoms: Secondary | ICD-10-CM | POA: Diagnosis not present

## 2020-07-12 DIAGNOSIS — Z86010 Personal history of colonic polyps: Secondary | ICD-10-CM

## 2020-07-12 DIAGNOSIS — C7951 Secondary malignant neoplasm of bone: Secondary | ICD-10-CM | POA: Diagnosis present

## 2020-07-12 DIAGNOSIS — E538 Deficiency of other specified B group vitamins: Secondary | ICD-10-CM | POA: Diagnosis present

## 2020-07-12 DIAGNOSIS — E440 Moderate protein-calorie malnutrition: Secondary | ICD-10-CM | POA: Diagnosis present

## 2020-07-12 DIAGNOSIS — I959 Hypotension, unspecified: Secondary | ICD-10-CM | POA: Diagnosis present

## 2020-07-12 DIAGNOSIS — R109 Unspecified abdominal pain: Secondary | ICD-10-CM

## 2020-07-12 LAB — VITAMIN B12: Vitamin B-12: 891 pg/mL (ref 180–914)

## 2020-07-12 LAB — CBC
HCT: 40 % (ref 39.0–52.0)
Hemoglobin: 13.1 g/dL (ref 13.0–17.0)
MCH: 30 pg (ref 26.0–34.0)
MCHC: 32.8 g/dL (ref 30.0–36.0)
MCV: 91.5 fL (ref 80.0–100.0)
Platelets: 157 10*3/uL (ref 150–400)
RBC: 4.37 MIL/uL (ref 4.22–5.81)
RDW: 16.4 % — ABNORMAL HIGH (ref 11.5–15.5)
WBC: 6.8 10*3/uL (ref 4.0–10.5)
nRBC: 0 % (ref 0.0–0.2)

## 2020-07-12 LAB — COMPREHENSIVE METABOLIC PANEL
ALT: 44 U/L (ref 0–44)
AST: 38 U/L (ref 15–41)
Albumin: 2.8 g/dL — ABNORMAL LOW (ref 3.5–5.0)
Alkaline Phosphatase: 237 U/L — ABNORMAL HIGH (ref 38–126)
Anion gap: 12 (ref 5–15)
BUN: 33 mg/dL — ABNORMAL HIGH (ref 8–23)
CO2: 18 mmol/L — ABNORMAL LOW (ref 22–32)
Calcium: 9.1 mg/dL (ref 8.9–10.3)
Chloride: 106 mmol/L (ref 98–111)
Creatinine, Ser: 1.43 mg/dL — ABNORMAL HIGH (ref 0.61–1.24)
GFR calc Af Amer: 58 mL/min — ABNORMAL LOW (ref >60)
GFR calc non Af Amer: 50 mL/min — ABNORMAL LOW (ref >60)
Glucose, Bld: 83 mg/dL (ref 70–99)
Potassium: 3.7 mmol/L (ref 3.5–5.1)
Sodium: 136 mmol/L (ref 135–145)
Total Bilirubin: 1 mg/dL (ref 0.3–1.2)
Total Protein: 5.9 g/dL — ABNORMAL LOW (ref 6.5–8.1)

## 2020-07-12 LAB — PREALBUMIN: Prealbumin: 10.3 mg/dL — ABNORMAL LOW (ref 18–38)

## 2020-07-12 LAB — LACTIC ACID, PLASMA: Lactic Acid, Venous: 1.4 mmol/L (ref 0.5–1.9)

## 2020-07-12 LAB — TSH: TSH: 4.003 u[IU]/mL (ref 0.350–4.500)

## 2020-07-12 LAB — AMMONIA: Ammonia: 40 umol/L — ABNORMAL HIGH (ref 9–35)

## 2020-07-12 MED ORDER — SODIUM CHLORIDE 0.9 % IV SOLN: INTRAVENOUS | Status: DC

## 2020-07-12 MED ORDER — ONDANSETRON HCL 4 MG PO TABS: 4.0000 mg | ORAL_TABLET | Freq: Four times a day (QID) | ORAL | Status: DC | PRN

## 2020-07-12 MED ORDER — TRAMADOL HCL 50 MG PO TABS
50.0000 mg | ORAL_TABLET | Freq: Three times a day (TID) | ORAL | Status: DC | PRN
  Filled 2020-07-12: qty 1

## 2020-07-12 MED ORDER — POLYETHYLENE GLYCOL 3350 17 G PO PACK: 17.0000 g | PACK | Freq: Every day | ORAL | Status: DC | PRN

## 2020-07-12 MED ORDER — HEPARIN SODIUM (PORCINE) 5000 UNIT/ML IJ SOLN
5000.0000 [IU] | Freq: Three times a day (TID) | INTRAMUSCULAR | Status: DC
  Administered 2020-07-12 – 2020-07-18 (×18): 5000 [IU] via SUBCUTANEOUS
  Filled 2020-07-12 (×18): qty 1

## 2020-07-12 MED ORDER — ONDANSETRON HCL 4 MG/2ML IJ SOLN: 4.0000 mg | Freq: Four times a day (QID) | INTRAMUSCULAR | Status: DC | PRN

## 2020-07-12 NOTE — Progress Notes (Addendum)
See my note from 07/11/2020  He is due for monthly Sandostatin.  We will place this on hold for now.  I am not sure whether this can be given as an inpatient.  He can be placed on short acting octreotide if he develops diarrhea.  Please call oncology as needed over the weekend.  I will check on him 07/15/2020.  I appreciate the care from Dr. Maryland Pink

## 2020-07-12 NOTE — H&P (Signed)
History and Physical  CAMBREN HELM WUG:[id number] DOB: Oct 06, 1951 DOA: 07/12/2020  Referring physician: Betsy Coder PCP: Erven Colla, DO  Outpatient Specialists: Dr. Benay Spice (oncology) Patient coming from: home At his baseline ambulates with a walker  Chief Complaint: 1 month of worsening weakness, lethargy, diminished oral intake, chronic diarrhea, weight loss.      HPI: LIONEL WOODBERRY is a 69 y.o. 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     Spoke with daughter Brandy Kabat. She mentions after undergoing TURP for his BPH with urinary retention on 05/13/2020 patient has had multiple ongoing problems including the weight loss, poor appetite, and weakness. She also mentions loose stool for about a month and blood in his urine.  She is hopeful for him to get strong enough to walk with assistance and to deal with his confusion.    He was last in [place] in early July(7/7-7/9).  He was treated for Klebsiella UTI and discharged on Keflex which he completed, and lactulose therapy for hepatic encephalopathy.  After that hospitalization she was instructed to only give lactulose to maintain 2-3 bowel movements daily; she's been giving him the lactulose every other day because he has had chronic diarrhea prior to using lactulose as difficulty using the bathroom in time when lactulose added. She has noticed a slow worsening of his confusion that waxes and wanes since his discharge from the hospital with inability to tolerate finances, having odd conversations, and missing his medications. He has been very  lethargic over the same time period.  He lives with his daughter. His atenolol was discontinued about a week ago after an ED visit on 7/156 with SBP of 88. He also fell during that time as he was not using his walker.  He has nurse visits since then with BP ranging 95/60 earlier this week.    She denies any fevers though he does state that he is cold often.  He sometimes endorses a pain that starts at the top of his head and goes down to his feet .  Poor appetite for a month 16.9 oz x 2 water bottles   He was seen in his oncologist office on 7/22 was concerned by his lethargy, confusion normal appearance concerning for dehydration and inability to take care of himself at home.  Recommended hospital admission for further evaluation of altered mental status, and rule out hepatic encephalopathy related to his large liver tumor burden.  Lab work at his oncologist office concerning for potassium of 3.2, creatinine of 1.32, ALT 51, BUN 30, CO2 19 on 7/22.    Review of Systems:As mentioned in the history of present illness.Review of systems are otherwise negative Patient seen on the floor.   Past Medical History:  Diagnosis Date  . Allergy   . Barrett's esophagus   . BPH (benign prostatic hyperplasia)   . Carcinoid tumor of cecum   . Colon polyp   . Complication of anesthesia 2010   woke up during radiation pellet insertion  . Foley catheter in place last 3- 4 weeks  . GERD (gastroesophageal reflux disease)   . Heart murmur    "had all my left" no cardiologist currently had checked 3-4  times in past  . Hypertension   . IBS (irritable bowel syndrome)   . Malignant carcinoid tumors of other sites [facility]) 08/25/2016   colon  . Perennial allergic rhinitis    Past Surgical History:  Procedure Laterality Date  . colectomy  09/29/11   lap - right with liver bx  . COLON SURGERY  2017  . COLONOSCOPY  07/2011  . radioembolization right liver  2016  . TRANSURETHRAL RESECTION OF PROSTATE N/A 05/13/2020    Procedure: TRANSURETHRAL RESECTION OF THE PROSTATE (TURP);  Surgeon: Kathie Rhodes, MD;  Location: [facility];  Service: Urology;  Laterality: N/A;  . UPPER GASTROINTESTINAL ENDOSCOPY     hx barrett's esophagus  . WEDGE LIVER BIOPSY  09/29/11   right liver lesion   Allergies  Allergen Reactions  . Vasotec [Enalapril] Cough   Social History:  reports that he has been smoking cigarettes. He has a 4.25 pack-year smoking history. He has never used smokeless tobacco. He reports that he does not drink alcohol and does not use drugs. Family History  Problem Relation Age of Onset  . Hypertension Father   . Hyperlipidemia Father   . Heart attack Father   . Colon polyps Neg Hx   . Colon cancer Neg Hx   . Stomach cancer Neg Hx   . Cancer Neg Hx   . Esophageal cancer Neg Hx   . Pancreatic cancer Neg Hx   . Rectal cancer Neg Hx       Prior to Admission medications   Medication Sig Start Date End Date Taking? Authorizing Provider  atenolol (TENORMIN) 25 MG tablet Take 1 tablet (25 mg total) by mouth daily. 06/28/20 06/28/21 Yes Emokpae, Courage, MD  lactulose, encephalopathy, (CHRONULAC) 10 GM/15ML SOLN Take 15 mLs (10 g total) by mouth daily. May take additional dose each day if no bowel movement 06/28/20  Yes Emokpae, Courage, MD  potassium chloride SA (KLOR-CON) 20 MEQ tablet TAKE 1 TABLET(20 MEQ) BY MOUTH TWICE DAILY Patient taking differently: Take 20 mEq by mouth 2 (two) times daily.  11/27/19  Yes Ladell Pier, MD  tamsulosin (FLOMAX) 0.4 MG CAPS capsule Take 1 capsule (0.4 mg total) by mouth at bedtime. 06/28/20   Roxan Hockey, MD    Physical Exam: There were no vitals taken for this visit.  Constitutional frail, thin, chronically ill-appearing male Eyes: EOMI, anicteric, normal conjunctivae ENMT: Dry oral mucosa Cardiovascular: RRR no MRGs, with no peripheral edema Respiratory: Normal respiratory effort on room air, diminished breath sounds at bases, Abdomen:  Soft,non-tender, nondistended, no guarding Skin: Dry, no rash ulcers, or lesions.  Neurologic: Grossly no focal neuro deficit.  Following simple commands.  Very hard of hearing Psychiatric: Alert to self, place, context, not to time.  Slow speaking, easily distracted.          Labs on Admission:  Basic Metabolic Panel: Recent Labs  Lab 07/11/20 1334  NA 135  K 3.2*  CL 108  CO2 19*  GLUCOSE 125*  BUN 30*  CREATININE 1.32*  CALCIUM 9.1   Liver Function Tests: Recent Labs  Lab 07/11/20 1334  AST 39  ALT 51*  ALKPHOS 280*  BILITOT 1.2  PROT 6.4*  ALBUMIN 3.2*   No results for input(s): LIPASE, AMYLASE in the last 168 hours. No results for input(s): AMMONIA in the last 168 hours. CBC: Recent Labs  Lab 07/11/20 1334  WBC 7.3  NEUTROABS 4.6  HGB 13.4  HCT 39.9  MCV 88.1  PLT 186  Cardiac Enzymes: No results for input(s): CKTOTAL, CKMB, CKMBINDEX, TROPONINI in the last 168 hours.  BNP (last 3 results) No results for input(s): BNP in the last 8760 hours.  ProBNP (last 3 results) No results for input(s): PROBNP in the last 8760 hours.  CBG: No results for input(s): GLUCAP in the last 168 hours.  Radiological Exams on Admission: No results found.   Assessment/Plan Present on Admission: . 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 Dehydration . BPH without urinary obstruction . Altered mental state . Hypotension . 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  Seems like hypoactive delirium, waxes and wanes during examination.  Previous history of hepatic encephalopathy in the setting of hepatic metastases.  Still follows commands with no focal deficits -Check ammonia,  will hold off on lactulose until determined to be high given reports of chronic diarrhea -Check TSH, B12, CMP, CBC -Delirium precautions -Infectious work-up; UA, blood cultures if becomes febrile  Hypotension.  Likely related to diminished oral intake suspect in the setting of failure to thrive complicated by chronic diarrhea from neuroendocrine carcinoma.  Rule out reversible causes, doubt infection given no fever and no localizing signs or symptoms of infection -Check CBC to ensure stability of hemoglobin -Check BMP to ensure no kidney dysfunction-AKI -Check orthostatics -Check lactic acid --Am cortisol -Timed normal saline x24 hours and reassess  -Hold home atenolol/Flomax  Failure to thrive in setting of metastatic neuroendocrine carcinoma.  Has been referred  to palliative care as outpatient -consult palliative care, daughter agreeable  Chronic diarrhea.  No acute changes.  Has 4-5 bowel movements of loose stool daily, with decreased oral intake.  No abdominal pain on exam, no fever. -Given chronicity of complaint hold off on CT for GIP. -Holding home lactulose until checking ammonia to determine if need to continue therapy -Monitor here  Hematuria with history of BPH.  Status post TURP on 05/13/2020 for persistent urinary retention despite high-dose alpha blockade therapy.  Daughter reports hematuria has been ongoing for the past month -Holding home Flomax given hypotension -Monitor output -Repeat UA, abdominal x-ray rule out stone pathology (patient has no abdominal discomfort on exam currently)  Metastatic neuroendocrine carcinomatosis involving the terminal ileum (status post colon/ileal resection 2012) with new metastatic liver and bone lesions (04/05/2020) followed by Dr. Benay Spice, monthly Sandostatin -Hold Sandostatin while in hospital -If develops diarrhea can consider octreotide per oncology -Dr. Benay Spice will follow 7/26   DVT prophylaxis: Heparin  Code Status: DNR,  discussed with daughter on day of admission   Family Communication: Spoke with daughter Fender Herder Disposition Plan: Needs IV fluids for hypotension, suspect failure to thrive driving also symptoms, ruling out infection other acute potentially reversible abnormalities  Consults called: Oncology following  Admission status: Admitted as inpatient to telemetry   Desiree Hane MD [facility]  Pager [phone]  If 7PM-7AM, please contact night-coverage [email or web address] Password [facility]  07/12/2020, 5:40 PM

## 2020-07-12 NOTE — Progress Notes (Signed)
Patient arrived on unit. Patient is in no acute distress. Daughter at bedside. Awaiting admitting orders. Will continue to monitor.

## 2020-07-12 NOTE — Telephone Encounter (Signed)
Per 7/22 los, no changes made to pt schedule

## 2020-07-13 ENCOUNTER — Other Ambulatory Visit: Payer: Self-pay

## 2020-07-13 DIAGNOSIS — R627 Adult failure to thrive: Secondary | ICD-10-CM

## 2020-07-13 LAB — COMPREHENSIVE METABOLIC PANEL
ALT: 45 U/L — ABNORMAL HIGH (ref 0–44)
AST: 40 U/L (ref 15–41)
Albumin: 2.6 g/dL — ABNORMAL LOW (ref 3.5–5.0)
Alkaline Phosphatase: 234 U/L — ABNORMAL HIGH (ref 38–126)
Anion gap: 7 (ref 5–15)
BUN: 31 mg/dL — ABNORMAL HIGH (ref 8–23)
CO2: 20 mmol/L — ABNORMAL LOW (ref 22–32)
Calcium: 8.5 mg/dL — ABNORMAL LOW (ref 8.9–10.3)
Chloride: 107 mmol/L (ref 98–111)
Creatinine, Ser: 1.35 mg/dL — ABNORMAL HIGH (ref 0.61–1.24)
GFR calc Af Amer: >60 mL/min (ref >60)
GFR calc non Af Amer: 53 mL/min — ABNORMAL LOW (ref >60)
Glucose, Bld: 81 mg/dL (ref 70–99)
Potassium: 3.4 mmol/L — ABNORMAL LOW (ref 3.5–5.1)
Sodium: 134 mmol/L — ABNORMAL LOW (ref 135–145)
Total Bilirubin: 1.1 mg/dL (ref 0.3–1.2)
Total Protein: 5.3 g/dL — ABNORMAL LOW (ref 6.5–8.1)

## 2020-07-13 LAB — CBC WITH DIFFERENTIAL/PLATELET
Abs Immature Granulocytes: 0.04 10*3/uL (ref 0.00–0.07)
Basophils Absolute: 0 10*3/uL (ref 0.0–0.1)
Basophils Relative: 1 %
Eosinophils Absolute: 0 10*3/uL (ref 0.0–0.5)
Eosinophils Relative: 1 %
HCT: 38.8 % — ABNORMAL LOW (ref 39.0–52.0)
Hemoglobin: 12.5 g/dL — ABNORMAL LOW (ref 13.0–17.0)
Immature Granulocytes: 1 %
Lymphocytes Relative: 24 %
Lymphs Abs: 1.5 10*3/uL (ref 0.7–4.0)
MCH: 29.5 pg (ref 26.0–34.0)
MCHC: 32.2 g/dL (ref 30.0–36.0)
MCV: 91.5 fL (ref 80.0–100.0)
Monocytes Absolute: 0.7 10*3/uL (ref 0.1–1.0)
Monocytes Relative: 11 %
Neutro Abs: 4 10*3/uL (ref 1.7–7.7)
Neutrophils Relative %: 62 %
Platelets: 154 10*3/uL (ref 150–400)
RBC: 4.24 MIL/uL (ref 4.22–5.81)
RDW: 16.6 % — ABNORMAL HIGH (ref 11.5–15.5)
WBC: 6.3 10*3/uL (ref 4.0–10.5)
nRBC: 0 % (ref 0.0–0.2)

## 2020-07-13 LAB — CORTISOL-AM, BLOOD: Cortisol - AM: 22 ug/dL (ref 6.7–22.6)

## 2020-07-13 LAB — URINALYSIS, ROUTINE W REFLEX MICROSCOPIC
Bilirubin Urine: NEGATIVE
Glucose, UA: NEGATIVE mg/dL
Ketones, ur: NEGATIVE mg/dL
Nitrite: NEGATIVE
Protein, ur: 100 mg/dL — AB
RBC / HPF: >50 RBC/hpf — ABNORMAL HIGH (ref 0–5)
Specific Gravity, Urine: 1.016 (ref 1.005–1.030)
pH: 6 (ref 5.0–8.0)

## 2020-07-13 LAB — GLUCOSE, CAPILLARY
Glucose-Capillary: 66 mg/dL — ABNORMAL LOW (ref 70–99)
Glucose-Capillary: 80 mg/dL (ref 70–99)

## 2020-07-13 MED ORDER — RIFAXIMIN 550 MG PO TABS
550.0000 mg | ORAL_TABLET | Freq: Two times a day (BID) | ORAL | Status: DC
  Administered 2020-07-13 – 2020-07-18 (×10): 550 mg via ORAL
  Filled 2020-07-13 (×10): qty 1

## 2020-07-13 MED ORDER — BOOST / RESOURCE BREEZE PO LIQD CUSTOM
1.0000 | Freq: Every day | ORAL | Status: DC
  Administered 2020-07-15 – 2020-07-18 (×4): 1 via ORAL

## 2020-07-13 MED ORDER — ENSURE ENLIVE PO LIQD
237.0000 mL | Freq: Two times a day (BID) | ORAL | Status: DC
  Administered 2020-07-13 – 2020-07-18 (×8): 237 mL via ORAL

## 2020-07-13 MED ORDER — KCL IN DEXTROSE-NACL 20-5-0.45 MEQ/L-%-% IV SOLN
INTRAVENOUS | Status: DC
  Administered 2020-07-13: 75 mL/h via INTRAVENOUS
  Filled 2020-07-13 (×6): qty 1000

## 2020-07-13 MED ORDER — TAMSULOSIN HCL 0.4 MG PO CAPS
0.4000 mg | ORAL_CAPSULE | Freq: Every day | ORAL | Status: DC
  Administered 2020-07-13 – 2020-07-17 (×5): 0.4 mg via ORAL
  Filled 2020-07-13 (×5): qty 1

## 2020-07-13 NOTE — Progress Notes (Signed)
Report received from ongoing nurse. Agreed with nurse assessment of patient and will cont to monitor. 

## 2020-07-13 NOTE — Progress Notes (Signed)
Initial Nutrition Assessment  DOCUMENTATION CODES:   Not applicable  INTERVENTION:  Ensure Enlive po BID, each supplement provides 350 kcal and 20 grams of protein Boost Breeze po daily, each supplement provides 250 kcal and 9 grams of protein Magic cup BID with meals, each supplement provides 290 kcal and 9 grams of protein  Recommend obtaining current wt as able to fully assess needs.  High refeed risk, recommend monitoring magnesium, potassium, and phosphorus daily with improving po intake.   NUTRITION DIAGNOSIS:   Inadequate oral intake related to cancer and cancer related treatments as evidenced by per patient/family report, percent weight loss (per chart, poor po intake and chronic diarrhea x 1 month).    GOAL:   Patient will meet greater than or equal to 90% of their needs    MONITOR:   PO intake, Weight trends, Supplement acceptance, Labs, I & O's  REASON FOR ASSESSMENT:   Consult Assessment of nutrition requirement/status, Poor PO (in the setting of chronic illness)  ASSESSMENT:  RD working remotely.  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  Per H&P, pt reports 1 month history of worsening weakness, lethargy, diminished oral intake, chronic diarrhea, and wt loss. Patient noted very hard of hearing, RD did attempt to contact pt via phone, however received a busy signal, unable to obtain nutrition history at this time.   Patient is on regular diet, per flowsheets he consumed 50% of breakfast tray this morning. Will order Ensure and Boost Breeze supplements as well as Magic Cup on meal trays to aid  with meeting needs. Given reported po oral intake and chronic diarrhea over the past month, pt is at risk for refeeding, noted hypokalemia per labs. Recommend monitoring electrolytes daily as po intake improves, MD to replete as needed.  Mild pitting BLE edema per RN assessment. No weights this admission. On 7/22 pt weighed (71.9 kg) 158.18 lb. Per chart, weights have fluctuated over the past 3 months. On 03/24/20 he weighed 97 kg (213.4 lb), on 04/18/20 pt weighed 95.4 kg (209.88 lb), on 05/13/20 weights decreased to 91.9 kg (202.18 lb), on 06/25/20 weights increased to 95.3 kg (209.66 lb). Unsure if weights in July were stated, however it appears that weights have trended down 51.7 lb (24.6%) in the last 3 months which is significant. Given metastatic neuroendocrine cancer, highly suspect malnutrition, however unable to identify at this time. Recommend weighing pt as able to fully assess needs.   UOP: 200 ml since admit Medications reviewed and include: IVF: D5 and NaCl with KCl 20 mEq/L @ 75 ml/hr (306 kcal) Labs: CBGs 80,66 Na 134 (L), K 3.4 (L), BUN 31 (H), Cr 1.35 (H)  NUTRITION - FOCUSED PHYSICAL EXAM: Unable to complete at this time, RD working remotely.  Diet Order:   Diet Order            Diet regular Room service appropriate? Yes; Fluid consistency: Thin  Diet effective now                 EDUCATION NEEDS:   Not appropriate for education at this time  Skin:  Skin Assessment: Reviewed RN Assessment  Last BM:  7/24-type 7  Height:  Ht Readings from Last 1 Encounters:  07/11/20 6\' 1"  (1.854 m)    Weight:   Wt Readings from Last 1 Encounters:  07/11/20 71.9 kg    Ideal Body Weight:  83.6 kg  BMI:  There is no height or weight on file to calculate BMI.  Estimated Nutritional Needs:   Kcal:  0712-1975  Protein:  115-125  Fluid:  >/= 2.3 L   Lajuan Lines, RD, LDN Clinical Nutrition After Hours/Weekend Pager # in [place]

## 2020-07-13 NOTE — Telephone Encounter (Signed)
Needs more info, left letter on your desk for call to social worker or family to see what is going on.  Never examined the pt before.   Thx,   Dr. Lovena Le

## 2020-07-13 NOTE — Evaluation (Signed)
Physical Therapy Evaluation Patient Details Name: Tommy Conley MRN: [id number] DOB: 1951/10/14 Today's Date: 07/13/2020   History of Present Illness  69 y.o. 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  Admitted for AMS and FTT  Clinical Impression  Pt admitted with above diagnosis. Pt currently with functional limitations due to the deficits listed below (see PT Problem List). Pt will benefit from skilled PT to increase their independence and safety with mobility to allow discharge to the venue listed below.  Pt reports generalized weakness and not agreeable to ambulate today however was interested in OOB to recliner.  Pt requiring min assist for transfers.  Pt would benefit from d/c to SNF.     Follow Up Recommendations SNF;Supervision/Assistance - 24 hour    Equipment Recommendations  None recommended by PT    Recommendations for Other Services       Precautions / Restrictions Precautions Precautions: Fall      Mobility  Bed Mobility Overal bed mobility: Modified Independent                Transfers Overall transfer level: Needs assistance Equipment used: Rolling walker (2 wheeled) Transfers: Sit to/from [facility] Sit to Stand: Min assist Stand pivot transfers: Min assist       General transfer comment: assist to rise and steady, cues for safe technique  Ambulation/Gait             General Gait Details: pt declined to ambulate today, too weak  Stairs            Wheelchair Mobility    Modified Rankin (Stroke Patients Only)       Balance Overall  balance assessment: Needs assistance         Standing balance support: Bilateral upper extremity supported Standing balance-Leahy Scale: Poor Standing balance comment: reliant on UE support                             Pertinent Vitals/Pain Pain Assessment: No/denies pain    Home Living Family/patient expects to be discharged to:: Private residence Living Arrangements: Children (daughter) Available Help at Discharge: Family;Available PRN/intermittently Type of Home: Apartment Home Access: Level entry     Home Layout: One level Home Equipment: Cane - single point;Walker - 2 wheels Additional Comments: above per recent admission, pt not very talkative during session, reports using a RW    Prior Function Level of Independence: Independent with assistive device(s)         Comments: ambulatory with RW and able to perform ADLs per pt, pt also cooks, daughter assists, pt does not drive     Hand Dominance        Extremity/Trunk Assessment   Upper Extremity Assessment Upper Extremity Assessment: Generalized weakness    Lower Extremity Assessment Lower Extremity Assessment: Generalized weakness    Cervical / Trunk Assessment Cervical / Trunk Assessment: Normal  Communication   Communication: HOH  Cognition Arousal/Alertness: Awake/alert Behavior During Therapy: Flat affect Overall Cognitive Status: Within Functional Limits for tasks assessed  General Comments: appropriate responses however did not talk other than to answer questions      General Comments      Exercises     Assessment/Plan    PT Assessment Patient needs continued PT services  PT Problem List Decreased strength;Decreased activity tolerance;Decreased balance;Decreased mobility;Decreased knowledge of use of DME       PT Treatment Interventions Balance training;Gait training;Stair training;Functional mobility training;Therapeutic  activities;Patient/family education;Therapeutic exercise;DME instruction    PT Goals (Current goals can be found in the Care Plan section)  Acute Rehab PT Goals PT Goal Formulation: With patient Time For Goal Achievement: 07/27/20 Potential to Achieve Goals: Good    Frequency Min 2X/week   Barriers to discharge        Co-evaluation               AM-PAC PT "6 Clicks" Mobility  Outcome Measure Help needed turning from your back to your side while in a flat bed without using bedrails?: None Help needed moving from lying on your back to sitting on the side of a flat bed without using bedrails?: None Help needed moving to and from a bed to a chair (including a wheelchair)?: A Little Help needed standing up from a chair using your arms (e.g., wheelchair or bedside chair)?: A Little Help needed to walk in hospital room?: A Lot Help needed climbing 3-5 steps with a railing? : A Lot 6 Click Score: 18    End of Session Equipment Utilized During Treatment: Gait belt Activity Tolerance: Patient tolerated treatment well;Patient limited by fatigue Patient left: in chair;with call bell/phone within reach (RN aware pt is not on chair alarm, pt aware to call for assist) Nurse Communication: Mobility status PT Visit Diagnosis: Unsteadiness on feet (R26.81);Other abnormalities of gait and mobility (R26.89);Muscle weakness (generalized) (M62.81)    Time: 2836-6294 PT Time Calculation (min) (ACUTE ONLY): 10 min   Charges:   PT Evaluation $PT Eval Low Complexity: 1 Low        Kati PT, DPT Acute Rehabilitation Services Pager: [phone] Office: [phone]  York Ram E 07/13/2020, 3:16 PM

## 2020-07-13 NOTE — Progress Notes (Signed)
PROGRESS NOTE  Tommy Conley  WVP:[id number] DOB: 1951/09/04 DOA: 07/12/2020 PCP: Erven Colla, DO  Outpatient Specialists: Oncology, Dr. Benay Spice Brief Narrative: Tommy Conley is a 69 y.o. 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   He had TURP 05/13/2020 and has had hematuria as well as poor appetite, weight loss, weakness and more recently increase in loose stools. He was admitted at [facility] 7/7 - 7/9 with Klebsiella UTI and hepatic encephalopathy which was treated with lactulose. Waxing and waning confusion, associated with lethargy, has continued and lactulose is limited by baseline diarrhea. He's been found to be hypotensive at RN visits and in the ED 7/16 after a fall when not using his walker, so atenolol was stopped.  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 Multifactorial. No evidence of focal infection. - Feel safest venue at discharge with most likely chance at rehabilitation is SNF. PT/OT consulted, will also consult TOC.  - Dietitian consulted, supplement protein.  Dehydration, hypokalemia, AKI:  - Continue IV fluids, change to D5 1/2NS w/51mEq KCl @75cc /hr.   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 Benay Spice referred patient  for admission, will see again 7/26.  - Pt due for monthly sandostatin (started 2016), unable to order this as inpatient.  - Palliative care consulted at admission.  Hepatic encephalopathy related to liver tumor burden: Ammonia confirmed to be elevated. TSH, B12 wnl.  - Has had 6 BMs since admission. Will switch lactulose to rifaximin  BPH and hematuria: s/p TURP 05/13/2020 by Dr. Karsten Ro.  - Restart flomax - Monitor bladder scans to r/o retention. Urinalysis reordered.  Chronic blood loss anemia: Mild, normocytic.  - Check iron panel   HTN: Cortisol sufficient - Hold atenolol as discontinued prior due to hypotension, likely attributable to hypovolemia.   DVT prophylaxis: Heparin Code Status: DNR, POA Family Communication: Daughter by phone Disposition Plan:  Status is: Inpatient  Remains inpatient appropriate because:Unsafe d/c plan and IV treatments appropriate due to intensity of illness or inability to take PO   Dispo: The patient is from: Home              Anticipated d/c is to: SNF              Anticipated d/c date is: 2 days              Patient currently is not medically stable to d/c.  Consultants:   Oncology  Palliative care medicine team  Procedures:   None  Antimicrobials:  None   Subjective: Doesn't like hospital food, denies pain. Feels weak and tired. Has been having fecal urgency but denies urinary frequency/urgency/dysuria. No fever/chills.  Objective: Vitals:   07/12/20 2300 07/13/20 0217 07/13/20 0606 07/13/20 1550  BP:  104/65 111/68 116/71  Pulse:  80  85 92  Resp: 21 17 20 17   Temp:  (!) 97.4 F (36.3 C) (!) 97.5 F (36.4 C) 97.8 F (36.6 C)  TempSrc:  Oral Oral Oral  SpO2:  100% 97% 100%    Intake/Output Summary (Last 24 hours) at 07/13/2020 1651 Last data filed at 07/13/2020 1500 Gross per 24 hour  Intake 1853.42 ml  Output 200 ml  Net 1653.42 ml   There were no vitals filed for this visit.  Gen: Frail male in no distress    Pulm: Non-labored breathing. Clear to auscultation bilaterally.  CV: Regular rate and rhythm. No murmur, rub, or gallop. No JVD, trace LE edema. GI: Abdomen soft, non-tender, non-distended, with normoactive bowel sounds. No organomegaly or masses felt. Ext: Warm, no deformities Skin: No rashes, lesions or ulcers Neuro: Alert and slow but normal speech. No asterixis. No focal neurological deficits. Psych: Judgement and insight appear fair. Mood & affect appropriate.   Data Reviewed: I have personally reviewed following labs and imaging studies  CBC: Recent Labs  Lab 07/11/20 1334 07/12/20 2232 07/13/20 0847  WBC 7.3 6.8 6.3  NEUTROABS 4.6  --  4.0  HGB 13.4 13.1 12.5*  HCT 39.9 40.0 38.8*  MCV 88.1 91.5 91.5  PLT 186 157 631   Basic Metabolic Panel: Recent Labs  Lab 07/11/20 1334 07/12/20 2232 07/13/20 0847  NA 135 136 134*  K 3.2* 3.7 3.4*  CL 108 106 107  CO2 19* 18* 20*  GLUCOSE 125* 83 81  BUN 30* 33* 31*  CREATININE 1.32* 1.43* 1.35*  CALCIUM 9.1 9.1 8.5*   GFR: Estimated Creatinine Clearance: 52.5 mL/min (A) (by C-G formula based on SCr of 1.35 mg/dL (H)). Liver Function Tests: Recent Labs  Lab 07/11/20 1334 07/12/20 2232 07/13/20 0847  AST 39 38 40  ALT 51* 44 45*  ALKPHOS 280* 237* 234*  BILITOT 1.2 1.0 1.1  PROT 6.4* 5.9* 5.3*  ALBUMIN 3.2* 2.8* 2.6*   Recent Labs  Lab 07/12/20 2232  AMMONIA 40*   CBG: Recent Labs  Lab 07/13/20 0034 07/13/20 0119  GLUCAP 66* 80   Thyroid Function Tests: Recent Labs    07/12/20 2232  TSH 4.003   Anemia Panel: Recent Labs    07/12/20 2232  VITAMINB12 891   Urine analysis:    Component Value Date/Time   COLORURINE AMBER (A) 07/05/2020 1830   APPEARANCEUR CLOUDY (A) 07/05/2020 1830   LABSPEC 1.016 07/05/2020 1830   PHURINE 6.0 07/05/2020 1830   GLUCOSEU NEGATIVE 07/05/2020 1830   HGBUR LARGE (A) 07/05/2020 1830   BILIRUBINUR NEGATIVE 07/05/2020 1830   BILIRUBINUR +++ 02/27/2020 1359    KETONESUR NEGATIVE 07/05/2020 1830   PROTEINUR 100 (A) 07/05/2020 1830   UROBILINOGEN 0.2 09/23/2011 1111   NITRITE NEGATIVE 07/05/2020 1830   LEUKOCYTESUR SMALL (A) 07/05/2020 1830   Radiology Studies: DG Abd Portable 1V  Result Date: 07/12/2020 CLINICAL DATA:  Abdominal pain and hematuria. History of prostate surgery. EXAM: PORTABLE ABDOMEN - 1 VIEW COMPARISON:  Pelvic CT 06/26/2020. FINDINGS: The bowel gas pattern is nonobstructive. There is no supine evidence of free intraperitoneal air. Embolization coils and bowel anastomosis clips are present within the mid abdomen. Stable small right pelvic calcifications consistent with phleboliths. No acute osseous findings. IMPRESSION: No acute abdominal findings. Electronically Signed   By: Richardean Sale M.D.   On: 07/12/2020 18:39    Scheduled Meds: . [START ON 07/14/2020] feeding supplement  1 Container Oral Q breakfast  . feeding supplement (ENSURE ENLIVE)  237 mL Oral BID BM  . heparin  5,000 Units Subcutaneous Q8H   Continuous Infusions: . dextrose 5 % and 0.45 % NaCl with KCl 20 mEq/L 75 mL/hr (07/13/20 0958)     LOS: 1 day   Time spent: 25 minutes.  Patrecia Pour, MD [facility] [email or web address] 07/13/2020, 4:51 PM

## 2020-07-13 NOTE — Progress Notes (Signed)
OT Cancellation Note  Patient Details Name: Tommy Conley MRN: [id number] DOB: 1951/10/08   Cancelled Treatment:    Reason Eval/Treat Not Completed: Other (comment)  Pt working with PT at time OT checked on pt- will check back later this day or next day  Kari Baars, [place] [phone] Office- [phone], Edwena Felty D 07/13/2020, 3:25 PM

## 2020-07-14 LAB — CBC WITH DIFFERENTIAL/PLATELET
Abs Immature Granulocytes: 0.02 10*3/uL (ref 0.00–0.07)
Basophils Absolute: 0 10*3/uL (ref 0.0–0.1)
Basophils Relative: 0 %
Eosinophils Absolute: 0 10*3/uL (ref 0.0–0.5)
Eosinophils Relative: 0 %
HCT: 39.4 % (ref 39.0–52.0)
Hemoglobin: 12.4 g/dL — ABNORMAL LOW (ref 13.0–17.0)
Immature Granulocytes: 0 %
Lymphocytes Relative: 20 %
Lymphs Abs: 1.6 10*3/uL (ref 0.7–4.0)
MCH: 29.5 pg (ref 26.0–34.0)
MCHC: 31.5 g/dL (ref 30.0–36.0)
MCV: 93.6 fL (ref 80.0–100.0)
Monocytes Absolute: 1 10*3/uL (ref 0.1–1.0)
Monocytes Relative: 12 %
Neutro Abs: 5.3 10*3/uL (ref 1.7–7.7)
Neutrophils Relative %: 68 %
Platelets: 123 10*3/uL — ABNORMAL LOW (ref 150–400)
RBC: 4.21 MIL/uL — ABNORMAL LOW (ref 4.22–5.81)
RDW: 16.6 % — ABNORMAL HIGH (ref 11.5–15.5)
WBC: 7.9 10*3/uL (ref 4.0–10.5)
nRBC: 0 % (ref 0.0–0.2)

## 2020-07-14 LAB — BASIC METABOLIC PANEL
Anion gap: 9 (ref 5–15)
BUN: 25 mg/dL — ABNORMAL HIGH (ref 8–23)
CO2: 16 mmol/L — ABNORMAL LOW (ref 22–32)
Calcium: 8.5 mg/dL — ABNORMAL LOW (ref 8.9–10.3)
Chloride: 109 mmol/L (ref 98–111)
Creatinine, Ser: 0.93 mg/dL (ref 0.61–1.24)
GFR calc Af Amer: >60 mL/min (ref >60)
GFR calc non Af Amer: >60 mL/min (ref >60)
Glucose, Bld: 102 mg/dL — ABNORMAL HIGH (ref 70–99)
Potassium: 4.2 mmol/L (ref 3.5–5.1)
Sodium: 134 mmol/L — ABNORMAL LOW (ref 135–145)

## 2020-07-14 NOTE — Progress Notes (Signed)
PMT consult received and chart reviewed. No family at bedside this morning. Patient confused and needs daughter present for [place] discussion. Scheduled [place] meeting with daughter, [name] tomorrow 7/26 at 3pm. Thank you.  NO CHARGE  Ihor Dow, [place], FNP-C Palliative Medicine Team  Phone: [phone] Fax: [phone]

## 2020-07-14 NOTE — Evaluation (Signed)
Occupational Therapy Evaluation Patient Details Name: Tommy Conley MRN: [id number] DOB: May 31, 1951 Today's Date: 07/14/2020    History of Present Illness 69 y.o. 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  Admitted for AMS and FTT   Clinical Impression   Pt admitted with the above. Pt currently with functional limitations due to the deficits listed below (see OT Problem List).  Pt will benefit from skilled OT to increase their safety and independence with ADL and functional mobility for ADL to facilitate discharge to venue listed below.   Pt very weak with OT. Eval consisted of getting to [facility], hygiene and back to bed. Feel pt will need an increased level of care     Follow Up Recommendations  SNF    Equipment Recommendations  None recommended by OT    Recommendations for Other Services       Precautions / Restrictions Precautions Precautions: Fall      Mobility Bed Mobility Overal bed mobility: Modified Independent;Needs Assistance Bed Mobility: Supine to Sit   Sidelying to sit: Min assist Supine to sit: Min assist        Transfers Overall transfer level: Needs assistance Equipment used: Rolling walker (2 wheeled) Transfers: Sit to/from [facility] Sit to Stand: Mod assist Stand pivot transfers: Min assist       General transfer comment: assist to rise and steady, cues for safe technique    Balance Overall balance assessment: Needs assistance         Standing balance support: Bilateral upper extremity supported Standing balance-Leahy Scale: Poor Standing  balance comment: reliant on UE support                           ADL either performed or assessed with clinical judgement   ADL Overall ADL's : Needs assistance/impaired                         Toilet Transfer: Moderate assistance;BSC;RW Toilet Transfer Details (indicate cue type and reason): had accident with BM getting to [place] and Hygiene: Maximal assistance;Sit to/from stand Toileting - Clothing Manipulation Details (indicate cue type and reason): pt able to stand for hygiene       General ADL Comments: Upon OT arriving in room pt had to go to bathroom quickly. Pt able to get to [facility] but had diarrhea getting here. RN aware.  Pt followed instructions but fatigued quickly     Vision Patient Visual Report: No change from baseline              Pertinent Vitals/Pain Pain Assessment: No/denies pain     Hand Dominance     Extremity/Trunk Assessment Upper Extremity Assessment Upper Extremity Assessment: Generalized weakness       Cervical / Trunk Assessment Cervical / Trunk Assessment: Normal   Communication Communication Communication: HOH   Cognition Arousal/Alertness: Lethargic Behavior During Therapy: Flat affect Overall Cognitive Status: Within Functional Limits for tasks assessed  General Comments: appropriate responses however did not talk other than to answer questions   General Comments               Home Living Family/patient expects to be discharged to:: Private residence Living Arrangements: Children (daughter) Available Help at Discharge: Family;Available PRN/intermittently Type of Home: Apartment Home Access: Level entry     Home Layout: One level     Bathroom Shower/Tub: Teacher, early years/pre: Standard Bathroom Accessibility: Yes   Home Equipment: Cane - single point;Walker - 2 wheels   Additional Comments: above per recent  admission, pt not very talkative during session, reports using a RW      Prior Functioning/Environment Level of Independence: Independent with assistive device(s)        Comments: ambulatory with RW and able to perform ADLs per pt, pt also cooks, daughter assists, pt does not drive        OT Problem List: Decreased strength;Decreased activity tolerance;Impaired balance (sitting and/or standing);Decreased safety awareness;Decreased knowledge of use of DME or AE      OT Treatment/Interventions: Self-care/ADL training;Therapeutic exercise;DME and/or AE instruction;Therapeutic activities;Patient/family education    OT Goals(Current goals can be found in the care plan section) Acute Rehab OT Goals Patient Stated Goal: return home OT Goal Formulation: With patient ADL Goals Pt Will Perform Grooming: with supervision;standing Pt Will Perform Lower Body Dressing: with supervision;sit to/from stand Pt Will Transfer to Toilet: with supervision;bedside commode Pt Will Perform Toileting - Clothing Manipulation and hygiene: with supervision  OT Frequency: Min 2X/week    AM-PAC OT "6 Clicks" Daily Activity     Outcome Measure Help from another person eating meals?: None Help from another person taking care of personal grooming?: A Little Help from another person toileting, which includes using toliet, bedpan, or urinal?: A Lot Help from another person bathing (including washing, rinsing, drying)?: A Lot Help from another person to put on and taking off regular upper body clothing?: A Little Help from another person to put on and taking off regular lower body clothing?: A Lot 6 Click Score: 16   End of Session Equipment Utilized During Treatment: Rolling walker  Activity Tolerance: Patient tolerated treatment well Patient left: in bed;with call bell/phone within reach;with bed alarm set;with nursing/sitter in room  OT Visit Diagnosis: Unsteadiness on feet (R26.81);Muscle weakness  (generalized) (M62.81)                Time: 0712-1975 OT Time Calculation (min): 26 min Charges:  OT General Charges $OT Visit: 1 Visit OT Evaluation $OT Eval Moderate Complexity: 1 Mod OT Treatments $Self Care/Home Management : 8-22 mins  Kari Baars, OT Acute Rehabilitation Services [phone] Office- [phone], Edwena Felty D 07/14/2020, 5:54 PM

## 2020-07-14 NOTE — Progress Notes (Signed)
PROGRESS NOTE  Tommy Conley  JJK:[id number] DOB: 1951-03-13 DOA: 07/12/2020 PCP: Erven Colla, DO  Outpatient Specialists: Oncology, Dr. Benay Spice Brief Narrative: Tommy Conley is a 69 y.o. 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   Tommy Conley had TURP 05/13/2020 and has had hematuria as well as poor appetite, weight loss, weakness and more recently increase in loose stools. Tommy Conley was admitted at [facility] 7/7 - 7/9 with Klebsiella UTI and hepatic encephalopathy which was treated with lactulose. Waxing and waning confusion, associated with lethargy, has continued and lactulose is limited by baseline diarrhea. Tommy Conley's been found to be hypotensive at RN visits and in the ED 7/16 after a fall when not using his walker, so atenolol was stopped.  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 Multifactorial. No evidence of focal infection. - Feel safest venue at discharge with most likely chance at rehabilitation is SNF. PT/OT consulted, will also consult TOC.  - Dietitian consulted, supplement protein.  Tommy Conley, hypokalemia, AKI:  - Continue IV fluids as ordered. Labs pending, will modify order as needed based on metabolic panel.   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 Benay Spice referred patient for admission, will see again 7/26.  - Pt due for monthly sandostatin (started 2016), unable to order this as inpatient.  - Palliative Conley consulted at admission. D/w NP who is attempting to discuss with patient and daughter.   Hepatic encephalopathy related to liver tumor burden: Ammonia confirmed to be elevated. TSH, B12 wnl.  - Continue rifaximin  BPH and hematuria: s/p TURP 05/13/2020 by Dr. Karsten Ro.  - Continue flomax - Monitor bladder scans to r/o retention. Urinalysis confirms hematuria, not grossly bloody.  Chronic blood loss anemia: Mild, normocytic.  - Iron panel pending.  HTN: Cortisol sufficient - Hold atenolol as discontinued prior due to hypotension, likely attributable to hypovolemia.   DVT prophylaxis: Heparin Code Status: DNR, POA Family Communication: Daughter by phone 7/24. Disposition Plan:  Status is: Inpatient  Remains inpatient appropriate because:Unsafe d/c plan and IV treatments appropriate due to intensity of illness or inability to take PO   Dispo: The patient is from: Home              Anticipated d/c is to: SNF              Anticipated d/c date is: 1 day              Patient currently is not medically stable to d/c.  Consultants:   Oncology  Palliative Conley medicine team  Procedures:   None  Antimicrobials:  None   Subjective: Feels cold, otherwise has no complaints. Denies abd pain. Did eat some breakfast.   Objective: Vitals:   07/13/20 2128 07/14/20 0415 07/14/20 0422 07/14/20 0759  BP: 110/69 107/65  Pulse: 76 78    Resp: 20 16    Temp: (!) 97.5 F (36.4 C) (!) 97.5 F (36.4 C)    TempSrc: Oral Oral    SpO2: 100% 100%    Weight:   74.5 kg   Height:    6\' 1"  (1.854 m)    Intake/Output Summary (Last 24 hours) at 07/14/2020 1328 Last data filed at 07/14/2020 5188 Gross per 24 hour  Intake 1518.6 ml  Output 300 ml  Net 1218.6 ml   Filed Weights   07/14/20 0422  Weight:  74.5 kg   Gen: Frail male in no distress Pulm: Nonlabored breathing room air. Clear. CV: Regular rate and rhythm. No murmur, rub, or gallop. No JVD, no dependent edema. GI: Abdomen soft, non-tender, non-distended, with normoactive bowel sounds.  Ext: Warm, no deformities Skin: No rashes, lesions or ulcers on visualized skin. Neuro: Alert and oriented. No dysarthria, but slow mentation noted. No focal neurological deficits. Psych: Judgement and insight appear fair. Mood euthymic & affect congruent. Behavior is appropriate.    Data Reviewed: I have personally reviewed following labs and imaging studies  CBC: Recent Labs  Lab 07/11/20 1334 07/12/20 2232 07/13/20 0847  WBC 7.3 6.8 6.3  NEUTROABS 4.6  --  4.0  HGB 13.4 13.1 12.5*  HCT 39.9 40.0 38.8*  MCV 88.1 91.5 91.5  PLT 186 157 416   Basic Metabolic Panel: Recent Labs  Lab 07/11/20 1334 07/12/20 2232 07/13/20 0847  NA 135 136 134*  K 3.2* 3.7 3.4*  CL 108 106 107  CO2 19* 18* 20*  GLUCOSE 125* 83 81  BUN 30* 33* 31*  CREATININE 1.32* 1.43* 1.35*  CALCIUM 9.1 9.1 8.5*   GFR: Estimated Creatinine Clearance: 54.4 mL/min (A) (by C-G formula based on SCr of 1.35 mg/dL (H)). Liver Function Tests: Recent Labs  Lab 07/11/20 1334 07/12/20 2232 07/13/20 0847  AST 39 38 40  ALT 51* 44 45*  ALKPHOS 280* 237* 234*  BILITOT 1.2 1.0 1.1  PROT 6.4* 5.9* 5.3*  ALBUMIN 3.2* 2.8* 2.6*   Recent Labs  Lab 07/12/20 2232  AMMONIA 40*   CBG: Recent Labs  Lab 07/13/20 0034 07/13/20 0119  GLUCAP 66* 80   Thyroid Function Tests: Recent Labs    07/12/20 2232  TSH 4.003   Anemia Panel: Recent Labs    07/12/20 2232  VITAMINB12 891   Urine analysis:    Component Value Date/Time   COLORURINE YELLOW 07/13/2020 2215   APPEARANCEUR CLEAR 07/13/2020 2215   LABSPEC 1.016 07/13/2020 2215   PHURINE 6.0 07/13/2020 2215   GLUCOSEU NEGATIVE 07/13/2020 2215   HGBUR LARGE (A) 07/13/2020 2215   BILIRUBINUR NEGATIVE  07/13/2020 2215   BILIRUBINUR +++ 02/27/2020 1359   KETONESUR NEGATIVE 07/13/2020 2215   PROTEINUR 100 (A) 07/13/2020 2215   UROBILINOGEN 0.2 09/23/2011 1111   NITRITE NEGATIVE 07/13/2020 2215   LEUKOCYTESUR SMALL (A) 07/13/2020 2215   Radiology Studies: DG Abd Portable 1V  Result Date: 07/12/2020 CLINICAL DATA:  Abdominal pain and hematuria. History of prostate surgery. EXAM: PORTABLE ABDOMEN - 1 VIEW COMPARISON:  Pelvic CT 06/26/2020. FINDINGS: The bowel gas pattern is nonobstructive. There is no supine evidence of free intraperitoneal air. Embolization coils and bowel anastomosis clips are present within the mid abdomen. Stable small right pelvic calcifications consistent with phleboliths. No acute osseous findings. IMPRESSION: No acute abdominal findings. Electronically Signed   By: Richardean Sale M.D.   On: 07/12/2020 18:39    Scheduled Meds: .  feeding supplement  1 Container Oral Q breakfast  . feeding supplement (ENSURE ENLIVE)  237 mL Oral BID BM  . heparin  5,000 Units Subcutaneous Q8H  . rifaximin  550 mg Oral BID  . tamsulosin  0.4 mg Oral QHS   Continuous Infusions: . dextrose 5 % and 0.45 % NaCl with KCl 20 mEq/L 75 mL/hr at 07/14/20 1222     LOS: 2 days   Time spent: 25 minutes.  Patrecia Pour, MD [facility] [email or web address] 07/14/2020, 1:28 PM

## 2020-07-15 DIAGNOSIS — C787 Secondary malignant neoplasm of liver and intrahepatic bile duct: Secondary | ICD-10-CM | POA: Diagnosis not present

## 2020-07-15 DIAGNOSIS — Z7189 Other specified counseling: Secondary | ICD-10-CM

## 2020-07-15 DIAGNOSIS — C7A098 Malignant carcinoid tumors of other sites: Secondary | ICD-10-CM

## 2020-07-15 DIAGNOSIS — Z515 Encounter for palliative care: Secondary | ICD-10-CM

## 2020-07-15 DIAGNOSIS — C7A1 Malignant poorly differentiated neuroendocrine tumors: Secondary | ICD-10-CM | POA: Diagnosis not present

## 2020-07-15 DIAGNOSIS — R197 Diarrhea, unspecified: Secondary | ICD-10-CM

## 2020-07-15 LAB — CBC WITH DIFFERENTIAL/PLATELET
Abs Immature Granulocytes: 0.04 10*3/uL (ref 0.00–0.07)
Basophils Absolute: 0 10*3/uL (ref 0.0–0.1)
Basophils Relative: 0 %
Eosinophils Absolute: 0 10*3/uL (ref 0.0–0.5)
Eosinophils Relative: 0 %
HCT: 39.3 % (ref 39.0–52.0)
Hemoglobin: 12.9 g/dL — ABNORMAL LOW (ref 13.0–17.0)
Immature Granulocytes: 1 %
Lymphocytes Relative: 22 %
Lymphs Abs: 1.5 10*3/uL (ref 0.7–4.0)
MCH: 29.5 pg (ref 26.0–34.0)
MCHC: 32.8 g/dL (ref 30.0–36.0)
MCV: 89.7 fL (ref 80.0–100.0)
Monocytes Absolute: 0.9 10*3/uL (ref 0.1–1.0)
Monocytes Relative: 13 %
Neutro Abs: 4.3 10*3/uL (ref 1.7–7.7)
Neutrophils Relative %: 64 %
Platelets: 119 10*3/uL — ABNORMAL LOW (ref 150–400)
RBC: 4.38 MIL/uL (ref 4.22–5.81)
RDW: 16.4 % — ABNORMAL HIGH (ref 11.5–15.5)
WBC: 6.8 10*3/uL (ref 4.0–10.5)
nRBC: 0 % (ref 0.0–0.2)

## 2020-07-15 LAB — BASIC METABOLIC PANEL
Anion gap: 7 (ref 5–15)
BUN: 24 mg/dL — ABNORMAL HIGH (ref 8–23)
CO2: 17 mmol/L — ABNORMAL LOW (ref 22–32)
Calcium: 8.6 mg/dL — ABNORMAL LOW (ref 8.9–10.3)
Chloride: 109 mmol/L (ref 98–111)
Creatinine, Ser: 0.87 mg/dL (ref 0.61–1.24)
GFR calc Af Amer: >60 mL/min (ref >60)
GFR calc non Af Amer: >60 mL/min (ref >60)
Glucose, Bld: 92 mg/dL (ref 70–99)
Potassium: 3.6 mmol/L (ref 3.5–5.1)
Sodium: 133 mmol/L — ABNORMAL LOW (ref 135–145)

## 2020-07-15 LAB — PHOSPHORUS: Phosphorus: 1.9 mg/dL — ABNORMAL LOW (ref 2.5–4.6)

## 2020-07-15 LAB — IRON AND TIBC
Iron: 53 ug/dL (ref 45–182)
Saturation Ratios: 31 % (ref 17.9–39.5)
TIBC: 170 ug/dL — ABNORMAL LOW (ref 250–450)
UIBC: 117 ug/dL

## 2020-07-15 LAB — MAGNESIUM: Magnesium: 2.4 mg/dL (ref 1.7–2.4)

## 2020-07-15 LAB — FOLATE: Folate: 5.8 ng/mL — ABNORMAL LOW (ref >5.9)

## 2020-07-15 LAB — FERRITIN: Ferritin: 253 ng/mL (ref 24–336)

## 2020-07-15 MED ORDER — OCTREOTIDE ACETATE 50 MCG/ML IJ SOLN
100.0000 ug | Freq: Two times a day (BID) | INTRAMUSCULAR | Status: DC
  Administered 2020-07-15 – 2020-07-18 (×6): 100 ug via SUBCUTANEOUS
  Filled 2020-07-15 (×10): qty 2

## 2020-07-15 MED ORDER — POTASSIUM & SODIUM PHOSPHATES 280-160-250 MG PO PACK
1.0000 | PACK | Freq: Three times a day (TID) | ORAL | Status: DC
  Administered 2020-07-15 – 2020-07-18 (×12): 1 via ORAL
  Filled 2020-07-15 (×14): qty 1

## 2020-07-15 MED ORDER — FOLIC ACID 1 MG PO TABS
1.0000 mg | ORAL_TABLET | Freq: Every day | ORAL | Status: DC
  Administered 2020-07-15 – 2020-07-18 (×4): 1 mg via ORAL
  Filled 2020-07-15 (×4): qty 1

## 2020-07-15 MED ORDER — KCL-LACTATED RINGERS-D5W 20 MEQ/L IV SOLN
INTRAVENOUS | Status: DC
  Filled 2020-07-15 (×8): qty 1000

## 2020-07-15 MED ORDER — OCTREOTIDE ACETATE 50 MCG/ML IJ SOLN
100.0000 ug | Freq: Once | INTRAMUSCULAR | Status: AC
  Administered 2020-07-15: 100 ug via SUBCUTANEOUS
  Filled 2020-07-15: qty 2

## 2020-07-15 NOTE — Progress Notes (Signed)
°   07/15/20 1615  Assess: MEWS Score  Temp 97.8 F (36.6 C)  BP 98/67  Pulse Rate 85  Resp (!) 24  Level of Consciousness Alert  SpO2 100 %  Assess: MEWS Score  MEWS Temp 0  MEWS Systolic 1  MEWS Pulse 0  MEWS RR 1  MEWS LOC 0  MEWS Score 2  MEWS Score Color Yellow  Assess: if the MEWS score is Yellow or Red  Were vital signs taken at a resting state? Yes  Focused Assessment Change from prior assessment (see assessment flowsheet)  Early Detection of Sepsis Score *See Row Information* Low  MEWS guidelines implemented *See Row Information* No, vital signs rechecked  Treat  Pain Scale 0-10  Pain Score 0   Reassessed.  VSS.  Offers no complaints.  Will continue to monitor.

## 2020-07-15 NOTE — Progress Notes (Signed)
PROGRESS NOTE  Tommy Conley  JYN:[id number] DOB: Jul 08, 1951 DOA: 07/12/2020 PCP: Erven Colla, DO  Outpatient Specialists: Oncology, Dr. Benay Spice Brief Narrative: Tommy Conley is a 69 y.o. 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   He had TURP 05/13/2020 and has had hematuria as well as poor appetite, weight loss, weakness and more recently increase in loose stools. He was admitted at [facility] 7/7 - 7/9 with Klebsiella UTI and hepatic encephalopathy which was treated with lactulose. Waxing and waning confusion, associated with lethargy, has continued and lactulose is limited by baseline diarrhea. He's been found to be hypotensive at RN visits and in the ED 7/16 after a fall when not using his walker, so atenolol was stopped.  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 Multifactorial. No evidence of focal infection. - Feel safest venue at discharge with most likely chance at rehabilitation is SNF, TOC consulted. - Dietitian consulted, supplement protein.  Dehydration, hypokalemia, hyponatremia, metabolic acidosis, AKI: Improved with IV fluids.  - Continue IV fluids, change to D5LR w/KCl.  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 Benay Spice referred patient for admission, will see again 7/26.  - Pt due for monthly sandostatin (started 2016), octreotide ordered as inpatient. - Palliative care discussions throughout admission. Will attempt rehabilitation at SNF with palliative care following.   Hepatic encephalopathy related to liver tumor burden: Ammonia confirmed to be elevated. TSH, B12 wnl.  - Continue rifaximin  BPH and hematuria: s/p TURP 05/13/2020 by Dr. Karsten Ro.  - Continue flomax - Monitor bladder scans to r/o retention. Urinalysis confirms hematuria, not grossly bloody.  Folic acid deficiency:  - Supplement  Chronic blood loss anemia: Mild, normocytic.  - Hgb is stable.  Thrombocytopenia:  - Possibly related to hepatic tumor burden. Will plan to stop heparin if <100k.  HTN: Cortisol sufficient - Hold atenolol as discontinued prior due to hypotension, likely attributable to hypovolemia.   DVT prophylaxis: Heparin Code Status: DNR, POA Family Communication: Daughter by phone 7/25, planning palliative care meeting 7/26. Disposition Plan:  Status is: Inpatient  Remains inpatient appropriate because:Unsafe d/c plan and IV treatments appropriate due to intensity of illness or inability to take PO   Dispo: The patient is from: Home              Anticipated d/c is to: SNF              Anticipated d/c date is: 1 day              Patient currently is not medically stable to d/c.  Consultants:   Oncology  Palliative care medicine team  Procedures:   None  Antimicrobials:  None   Subjective: No complaints, no pain anywhere, still having significant diarrhea.  Objective: Vitals:   07/14/20 1418 07/14/20 2205 07/15/20 0500 07/15/20 0551  BP: 105/71 114/71  105/67  Pulse: 87 82  84  Resp: 23 20  22   Temp: 98 F (36.7 C) 97.7 F (36.5 C)  97.8 F (36.6 C)  TempSrc: Oral Oral  Oral  SpO2: 100% 100%  100%  Weight:   76.5 kg   Height:        Intake/Output  Summary (Last 24 hours) at 07/15/2020 1523 Last data filed at 07/15/2020 0905 Gross per 24 hour  Intake 1491.27 ml  Output 100 ml  Net 1391.27 ml   Filed Weights   07/14/20 0422 07/15/20 0500  Weight: 74.5 kg 76.5 kg   Gen: 69 y.o. male in no distress Pulm: Nonlabored breathing room air. Clear. CV: Regular rate and rhythm. No murmur, rub, or gallop. No JVD, no dependent edema. GI: Abdomen soft, non-tender, non-distended, with normoactive bowel sounds.  Ext: Warm, no deformities Skin: No rashes, lesions or ulcers on visualized skin. Neuro: Alert and mostly oriented. No focal neurological deficits. Psych: Judgement and insight appear fair.    Data Reviewed: I have personally reviewed following labs and imaging studies  CBC: Recent Labs  Lab 07/11/20 1334 07/12/20 2232 07/13/20 0847 07/14/20 1602 07/15/20 0542  WBC 7.3 6.8 6.3 7.9 6.8  NEUTROABS 4.6  --  4.0 5.3 4.3  HGB 13.4 13.1 12.5* 12.4* 12.9*  HCT 39.9 40.0 38.8* 39.4 39.3  MCV 88.1 91.5 91.5 93.6 89.7  PLT 186 157 154 123* 027*   Basic Metabolic Panel: Recent Labs  Lab 07/11/20 1334 07/12/20 2232 07/13/20 0847 07/14/20 1602 07/15/20 0542  NA 135 136 134* 134* 133*  K 3.2* 3.7 3.4* 4.2 3.6  CL 108 106 107 109 109  CO2 19* 18* 20* 16* 17*  GLUCOSE 125* 83 81 102* 92  BUN 30* 33* 31* 25* 24*  CREATININE 1.32* 1.43* 1.35* 0.93 0.87  CALCIUM 9.1 9.1 8.5* 8.5* 8.6*  MG  --   --   --   --  2.4  PHOS  --   --   --   --  1.9*   GFR: Estimated Creatinine Clearance: 86.7 mL/min (by C-G formula based on SCr of 0.87 mg/dL). Liver Function Tests: Recent Labs  Lab 07/11/20 1334 07/12/20 2232 07/13/20 0847  AST 39 38 40  ALT 51* 44 45*  ALKPHOS 280* 237* 234*  BILITOT 1.2 1.0 1.1  PROT 6.4* 5.9* 5.3*  ALBUMIN 3.2* 2.8* 2.6*   Recent Labs  Lab 07/12/20 2232  AMMONIA 40*   CBG: Recent Labs  Lab 07/13/20 0034 07/13/20 0119  GLUCAP 66* 80   Thyroid Function Tests: Recent Labs    07/12/20 2232  TSH  4.003   Anemia Panel: Recent Labs    07/12/20 2232 07/15/20 0542  VITAMINB12 891  --   FOLATE  --  5.8*  FERRITIN  --  253  TIBC  --  170*  IRON  --  53   Urine analysis:    Component Value Date/Time   COLORURINE YELLOW 07/13/2020 2215   APPEARANCEUR CLEAR 07/13/2020 2215   LABSPEC 1.016 07/13/2020 2215   PHURINE 6.0 07/13/2020 2215   GLUCOSEU NEGATIVE 07/13/2020 2215   HGBUR LARGE (A) 07/13/2020 2215   BILIRUBINUR NEGATIVE 07/13/2020 2215   BILIRUBINUR +++ 02/27/2020 1359   KETONESUR NEGATIVE 07/13/2020 2215   PROTEINUR 100 (A) 07/13/2020 2215   UROBILINOGEN 0.2 09/23/2011 1111   NITRITE NEGATIVE 07/13/2020 2215   LEUKOCYTESUR SMALL (  A) 07/13/2020 2215   Radiology Studies: No results found.  Scheduled Meds: . feeding supplement  1 Container Oral Q breakfast  . feeding supplement (ENSURE ENLIVE)  237 mL Oral BID BM  . heparin  5,000 Units Subcutaneous Q8H  . octreotide  100 mcg Subcutaneous BID  . octreotide  100 mcg Subcutaneous Once  . rifaximin  550 mg Oral BID  . tamsulosin  0.4 mg Oral QHS   Continuous Infusions: . dextrose 5 % and 0.45 % NaCl with KCl 20 mEq/L 75 mL/hr at 07/15/20 1404     LOS: 3 days   Time spent: 25 minutes.  Patrecia Pour, MD [facility] [email or web address] 07/15/2020, 3:23 PM

## 2020-07-15 NOTE — Progress Notes (Addendum)
HEMATOLOGY-ONCOLOGY PROGRESS NOTE  SUBJECTIVE: The patient is more alert today.  Still with periods of confusion.  He continues to have diarrhea.  PHYSICAL EXAMINATION:  Vitals:   07/14/20 2205 07/15/20 0551  BP: 114/71 105/67  Pulse: 82 84  Resp: 20 22  Temp: 97.7 F (36.5 C) 97.8 F (36.6 C)  SpO2: 100% 100%   Filed Weights   07/14/20 0422 07/15/20 0500  Weight: 74.5 kg 76.5 kg    Intake/Output from previous day: 07/25 0701 - 07/26 0700 In: 2032.6 [P.O.:360; I.V.:1672.6] Out: 100 [Urine:100]  GENERAL: Awake and alert, no distress OROPHARYNX:no exudate, no erythema and lips, buccal mucosa, and tongue normal  LUNGS: clear to auscultation and percussion with normal breathing effort HEART: regular rate & rhythm and no murmurs and no lower extremity edema ABDOMEN:abdomen soft, non-tender and normal bowel sounds  NEURO: alert & oriented x 3 with fluent speech, no focal motor/sensory deficits  LABORATORY DATA:  I have reviewed the data as listed CMP Latest Ref Rng & Units 07/15/2020 07/14/2020 07/13/2020  Glucose 70 - 99 mg/dL 92 102(H) 81  BUN 8 - 23 mg/dL 24(H) 25(H) 31(H)  Creatinine 0.61 - 1.24 mg/dL 0.87 0.93 1.35(H)  Sodium 135 - 145 mmol/L 133(L) 134(L) 134(L)  Potassium 3.5 - 5.1 mmol/L 3.6 4.2 3.4(L)  Chloride 98 - 111 mmol/L 109 109 107  CO2 22 - 32 mmol/L 17(L) 16(L) 20(L)  Calcium 8.9 - 10.3 mg/dL 8.6(L) 8.5(L) 8.5(L)  Total Protein 6.5 - 8.1 g/dL - - 5.3(L)  Total Bilirubin 0.3 - 1.2 mg/dL - - 1.1  Alkaline Phos 38 - 126 U/L - - 234(H)  AST 15 - 41 U/L - - 40  ALT 0 - 44 U/L - - 45(H)    Lab Results  Component Value Date   WBC 6.8 07/15/2020   HGB 12.9 (L) 07/15/2020   HCT 39.3 07/15/2020   MCV 89.7 07/15/2020   PLT 119 (L) 07/15/2020   NEUTROABS 4.3 07/15/2020    CT Head Wo Contrast  Result Date: 06/26/2020 CLINICAL DATA:  Hypotension and lethargy EXAM: CT HEAD WITHOUT CONTRAST TECHNIQUE: Contiguous axial images were obtained from the base of the  skull through the vertex without intravenous contrast. COMPARISON:  None. FINDINGS: Brain: Diffuse atrophic changes are noted. Findings of chronic white matter ischemic change are seen. No findings to suggest acute hemorrhage, acute infarction or space-occupying mass lesion are noted. Vascular: No hyperdense vessel or unexpected calcification. Skull: Normal. Negative for fracture or focal lesion. Sinuses/Orbits: No acute finding. Other: None. IMPRESSION: Chronic atrophic and ischemic changes without acute abnormality. Electronically Signed   By: Inez Catalina M.D.   On: 06/26/2020 01:03   CT Abdomen Pelvis W Contrast  Result Date: 06/26/2020 CLINICAL DATA:  Known metastatic cecal carcinoma with abdominal distension EXAM: CT ABDOMEN AND PELVIS WITH CONTRAST TECHNIQUE: Multidetector CT imaging of the abdomen and pelvis was performed using the standard protocol following bolus administration of intravenous contrast. CONTRAST:  165mL OMNIPAQUE IOHEXOL 300 MG/ML  SOLN COMPARISON:  04/05/2020 PET-CT FINDINGS: Lower chest: No acute abnormality. Hepatobiliary: Multiple enhancing lesions are noted throughout the liver consistent with metastatic disease. Largest of these measures 16 cm in dimension. Gallbladder is within normal limits. Changes of prior embolotherapy in the gastro duodenal artery are seen. Pancreas: Unremarkable. No pancreatic ductal dilatation or surrounding inflammatory changes. Spleen: Normal in size without focal abnormality. Adrenals/Urinary Tract: Adrenal glands are within normal limits. Kidneys demonstrate a normal enhancement pattern bilaterally. Normal excretion is noted from the  kidneys bilaterally. The bladder is decompressed Stomach/Bowel: Changes of prior right colonic surgery are noted. Ileocolic anastomosis is noted without obstructive change. Vascular/Lymphatic: Aortic atherosclerosis. No enlarged abdominal or pelvic lymph nodes. Reproductive: Prostate is mildly prominent indenting upon the  bladder. This is stable from prior PET-CT. Other: No abdominal wall hernia or abnormality. No abdominopelvic ascites. Musculoskeletal: Increased sclerosis is noted within the sacrum which corresponds to a metastatic lesion seen on prior PET-CT. Some scattered sclerotic lesions are noted within the spine consistent with metastatic disease. IMPRESSION: Changes consistent with diffuse hepatic metastatic disease similar to that seen on prior PET-CT. Bony metastatic disease is noted. No acute abnormality is noted correspond with the patient's given clinical history. Electronically Signed   By: Inez Catalina M.D.   On: 06/26/2020 01:58   DG Chest Port 1 View  Result Date: 06/25/2020 CLINICAL DATA:  Weakness EXAM: PORTABLE CHEST 1 VIEW COMPARISON:  09/23/2011 FINDINGS: Cardiac shadow is at the upper limits of normal in size. The lungs are well aerated bilaterally. No focal infiltrate is seen. No bony abnormality is noted. IMPRESSION: No acute abnormality noted. Electronically Signed   By: Inez Catalina M.D.   On: 06/25/2020 22:00   DG Abd Portable 1V  Result Date: 07/12/2020 CLINICAL DATA:  Abdominal pain and hematuria. History of prostate surgery. EXAM: PORTABLE ABDOMEN - 1 VIEW COMPARISON:  Pelvic CT 06/26/2020. FINDINGS: The bowel gas pattern is nonobstructive. There is no supine evidence of free intraperitoneal air. Embolization coils and bowel anastomosis clips are present within the mid abdomen. Stable small right pelvic calcifications consistent with phleboliths. No acute osseous findings. IMPRESSION: No acute abdominal findings. Electronically Signed   By: Richardean Sale M.D.   On: 07/12/2020 18:39    ASSESSMENT AND PLAN: 1. 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  1. Status post a right colon/ileum resection on 09/29/2011.  2. Wedge biopsy of a right liver lesion on 09/29/2011 confirmed metastatic well-differentiated  neuroendocrine carcinoma.  3. CT of the abdomen 08/25/2011 consistent with multiple liver metastases.  4. Elevated preoperative chromogranin A level and a 24-hour urine 5-HIAA level. Stable on repeat 11/16/2011. 5. Restaging CT on 12/28/2011 confirmed slight progression of the metastatic liver lesions. 6. Status post 3 cycles of Xeloda/temozolomide with cycle 3 initiated on 03/12/2012 7. Restaging CT of the abdomen 04/04/2012 revealed slight progression of the metastatic liver lesions and transverse mesocolon adenopathy. 8. Status post Y-90 radioembolization of the right liver on 05/19/2012. 9. Status post Y-90 radioembolization of the left liver on 07/06/2012. 10. Chromogranin A level decreased on 08/29/2012, 24-hour urine 5 HIAA slightly increased on 09/05/2012 11. Restaging CT on 12/05/2012 with a mixed response in the liver 12. Restaging CT 08/11/2013-mild increase in the size of liver lesions. 13. Restaging CT 01/22/2014-stable to slightly improved liver lesions, mild increase in mesenteric adenopathy. Chromogranin A level stable. 14. Chromogranin A level stable 05/01/2014. 15. Chromogranin A higher on 08/02/2014 16. Restaging CT 11/06/2014 with interval enlargement of the enhancing lesions within the left and right hepatic lobe. 17. Initiation of monthly Sandostatin 12/24/2014 18. Status post Y-90 radioembolization of the right liver on 01/03/2015 19. Chromogranin A level stable 02/18/2015 20. Chromogranin A level improved 08/12/2015 21. Restaging CT 09/11/2015 with mild increase in the size of the dominant liver metastasis in the right hepatic lobe. Other smaller liver metastases showed no significant change. Stable mild lymphadenopathy in central small bowel mesentery and right cardiophrenic angle. Stable subcm right middle  lobe pulmonary nodule. 22. Monthly Sandostatin continued 23. Gallium-DOTATATE01/25/2018-increased activity of a dominant right liver mass and left liver lesion.  Increased tracer accumulation in a mesenteric mass 24. Cycle 1Lutathera 03/30/2018 25. Cycle 2 Lutathera 05/25/2018 26. Cycle 3 Lutathera 07/20/2018 27. Cycle 4 Lutathera 09/14/2018 28. Restaging Netspot 10/18/2018-current scan very similar to pretherapy dotatate PET scan. No evidence of new or progressive neuroendocrine tumor. No enlarged tumor sites. 16. Netspot 04/05/2020-new FDG avid liver lesions, new bone metastases, enlarged prostate 2. History of diarrhea (3 to 4 times per day), predating surgery for 8 months. Decrease in daily bowel movements coinciding with discontinuation of Nexium. 3. Hypertension. 4. History of gastroesophageal reflux disease. No reflux symptoms since discontinuing Nexium. 5. Rectal and ascending colon polyps. He underwent a colonoscopy on 12/20/2012 with findings of a sessile polyp in the ascending colon and a sessile polyp in the rectum. Pathology showed hyperplastic polyps. Prior right colon resection noted with a normal appearing ileocolonic anastomosis. Small internal hemorrhoids noted. Repeat colonoscopy in 5 years. 6. History of Iron deficiency anemia 7. 08/12/2016 syncopal episode status post evaluation in the emergency department 8. Left-sided hearing loss July 2020 9. Urinary retention March 2021-followed at the urology center, TUR5/24/2021   Mr. Mankins appears improved compared to his visit in our office on 07/11/2020.  He was admitted for failure to thrive, progressive weakness, falls at home, and poor nutrition.  He was found to have dehydration, hypokalemia and AKI on admission.  He is more alert but still has some mild confusion.  He continues to have diarrhea which is related to his metastatic carcinoid.  Renal function significantly improved and hypokalemia has resolved.  Recommendations: 1.  PT recommending SNF and will await TOC team to initiate this process. 2.  The patient is due for Sandostatin.  He typically receives Sandostatin LAR 30 mg once  a month but this is nonformulary.  Discussed with pharmacist who indicated that regular Sandostatin is on formulary and we will dose this. 3.  Recommend dietitian consult.   LOS: 3 days   Mikey Bussing, DNP, AGPCNP-BC, AOCNP 07/15/20 Mr. Garris was interviewed and examined.  He is more alert compared to when I saw him last week.  He remains confused.  He complains of frequent diarrhea.  He has not received Sandostatin in a few months.  We cannot give Sandostatin in the hospital.  We will begin subcutaneous octreotide.  It is unclear to me whether the failure to thrive is due to untreated carcinoid syndrome, progression of the hepatic tumor burden with liver failure, or another etiology.  Hopefully his performance status will improve with treatment of the carcinoid syndrome.  Treatment options for the carcinoid tumor are limited.

## 2020-07-15 NOTE — Telephone Encounter (Signed)
According to pt chart he was admitted to the hospital Friday and just admitted to palliative care today.

## 2020-07-15 NOTE — Consult Note (Signed)
Consultation Note Date: 07/15/2020   Patient Name: Tommy Conley  DOB: [birth year]  MRN: [id number]  Age / Sex: 69 y.o., male  PCP: Erven Colla, DO Referring Physician: Patrecia Pour, MD  Reason for Consultation: Establishing goals of care  HPI/Patient Profile: 69 y.o. 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 Lab work at his oncologist office concerning for potassium of 3.2, creatinine of 1.32, ALT 51, BUN 30, CO2 19 on 7/22. PT/OT following and recommending SNF placement. 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 Receiving monthly Sandostatin started in 2016. Palliative medicine consultation for goals of care.    Clinical Assessment and Goals of Care:  I have reviewed medical records, discussed with care team, and met with patient and daughter Tommy Conley) at bedside to discuss goals of care. Patient will wake to voice but does not engage in conversation. He denies pain or discomfort.   I introduced Palliative Medicine as specialized medical care for people living with serious illness. It focuses on providing relief from the symptoms and stress of a serious illness. The goal is to improve quality of life for both the patient and the family.  We discussed a brief life review of the patient. Widowed. His wife died in 2019/11/24. Daughter, Tommy Conley only child. [name] describes her father as an active, independent  individual still prior to early July 2021. He was active in the church. He has always been private with medical information. [name] reports his health decline since the beginning of July, with declining functional/nutritional status and 40lb weight loss. [name] has lived with Tommy Conley and her two children for about one month now so she could help care for him.   Discussed events leading up to admission and course of hospitalization including diagnoses, interventions, plan of care. Reviewed oncology recommendations. Discussed PT/OT recommendations for SNF rehab.   [name] asks about differences between palliative and hospice services. Education provided. The difference between aggressive medical intervention and comfort care was discussed including SNF rehab with palliative versus home hospice and hospice facility. Although patient is more awake and alert, [name] does share his appetite remains poor. Discussed high risk for recurrent hospitalization with poor functional and nutritional status. [name] understands and is fearful of this too.   I attempted to elicit values and goals of care important to the patient and daughter. At this point, [name] would like to pursue SNF rehab knowing to tried everything possible to give her father a chance. Tommy Conley is very open to further hospice discussions and considering home hospice services if he does not progress with therapy and/or continues to decline. She is  interested in outpatient palliative follow-up.   Discussed MOST form in detail and clarified full code versus DNR code status and code blue scenario. After further discussion, Tommy Conley does confirm her decision for DNR/DNI code status.   Questions and concerns were addressed.  Hard Choices booklet left for review. The family was encouraged to call with questions or concerns.  PMT will continue to support holistically.   SUMMARY OF RECOMMENDATIONS    Daughter confirms DNR/DNI code status  Continue current plan of  care and medical management.  Continue PT/OT efforts.   Continue oncology follow-up.  Daughter wishes to pursue SNF rehab placement with outpatient palliative referral at SNF. Daughter is open to further hospice discussions pending his progression at rehab.   TOC team notified for SNF search and outpatient palliative referral.   Code Status/Advance Care Planning:  DNR  Symptom Management:   Per attending  Palliative Prophylaxis:   Aspiration, Bowel Regimen, Delirium Protocol, Frequent Pain Assessment, Oral Care and Turn Reposition  Psycho-social/Spiritual:   Desire for further Chaplaincy support: yes  Additional Recommendations: Caregiving  Support/Resources and Education on Hospice  Prognosis:   Unable to determine: poor long-term  Discharge Planning: [facility] [place] for rehab with Palliative care service follow-up      Primary Diagnoses: Present on Admission: . 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 Dehydration . BPH without urinary obstruction . Altered mental state . Hypotension . Failure to thrive (0-17)   I have reviewed the medical record, interviewed the patient and family, and examined the patient. The following aspects are pertinent.  Past Medical History:  Diagnosis Date  . Allergy   . Barrett's esophagus   . BPH (benign prostatic hyperplasia)   . Carcinoid tumor of cecum   . Colon polyp   . Complication of anesthesia 2010   woke up during radiation pellet insertion  . Foley catheter in place last 3- 4 weeks  . GERD (gastroesophageal reflux disease)   . Heart murmur    "had all my left" no cardiologist currently had checked 3-4 times in past  . Hypertension   . IBS (irritable bowel syndrome)   . Malignant carcinoid tumors of other sites [facility]) 08/25/2016   colon  . Perennial allergic rhinitis    Social History   Socioeconomic History  . Marital status: Legally Separated    Spouse  name: Not on file  . Number of children: Not on file  . Years of education: Not on file  . Highest education level: Not on file  Occupational History  . Not on file  Tobacco Use  . Smoking status: Light Tobacco Smoker    Packs/day: 0.25    Years: 17.00    Pack years: 4.25    Types: Cigarettes  . Smokeless tobacco: Never Used  . Tobacco comment: does not smoke every day  Vaping Use  . Vaping Use: Never used  Substance and Sexual Activity  . Alcohol use: No  . Drug use: No  . Sexual activity: Not on file  Other Topics Concern  . Not on file  Social History Narrative  . Not on file   Social Determinants of Health   Financial Resource Strain:   . Difficulty of Paying Living Expenses:   Food Insecurity:   . Worried About Charity fundraiser in the Last Year:   . Arboriculturist in the Last Year:   Transportation Needs:   . Film/video editor (Medical):   [name]  Lack of Transportation (Non-Medical):   Physical Activity:   . Days of Exercise per Week:   . Minutes of Exercise per Session:   Stress:   . Feeling of Stress :   Social Connections:   . Frequency of Communication with Friends and Family:   . Frequency of Social Gatherings with Friends and Family:   . Attends Religious Services:   . Active Member of Clubs or Organizations:   . Attends Archivist Meetings:   [name] Marital Status:    Family History  Problem Relation Age of Onset  . Hypertension Father   . Hyperlipidemia Father   . Heart attack Father   . Colon polyps Neg Hx   . Colon cancer Neg Hx   . Stomach cancer Neg Hx   . Cancer Neg Hx   . Esophageal cancer Neg Hx   . Pancreatic cancer Neg Hx   . Rectal cancer Neg Hx    Scheduled Meds: . feeding supplement  1 Container Oral Q breakfast  . feeding supplement (ENSURE ENLIVE)  237 mL Oral BID BM  . folic acid  1 mg Oral Daily  . heparin  5,000 Units Subcutaneous Q8H  . octreotide  100 mcg Subcutaneous BID  . octreotide  100 mcg Subcutaneous  Once  . potassium & sodium phosphates  1 packet Oral TID WC & HS  . rifaximin  550 mg Oral BID  . tamsulosin  0.4 mg Oral QHS   Continuous Infusions: . dextrose 5% lactated ringers with KCl 20 mEq/L     PRN Meds:.ondansetron **OR** ondansetron (ZOFRAN) IV, polyethylene glycol, traMADol Medications Prior to Admission:  Prior to Admission medications   Medication Sig Start Date End Date Taking? Authorizing Provider  atenolol (TENORMIN) 25 MG tablet Take 1 tablet (25 mg total) by mouth daily. 06/28/20 06/28/21 Yes Emokpae, Courage, MD  lactulose, encephalopathy, (CHRONULAC) 10 GM/15ML SOLN Take 15 mLs (10 g total) by mouth daily. May take additional dose each day if no bowel movement 06/28/20  Yes Emokpae, Courage, MD  potassium chloride SA (KLOR-CON) 20 MEQ tablet TAKE 1 TABLET(20 MEQ) BY MOUTH TWICE DAILY Patient taking differently: Take 20 mEq by mouth 2 (two) times daily.  11/27/19  Yes Ladell Pier, MD  tamsulosin (FLOMAX) 0.4 MG CAPS capsule Take 1 capsule (0.4 mg total) by mouth at bedtime. 06/28/20   Roxan Hockey, MD   Allergies  Allergen Reactions  . Vasotec [Enalapril] Cough   Review of Systems  Unable to perform ROS: Mental status change   Physical Exam Vitals and nursing note reviewed.  Constitutional:      Appearance: He is cachectic. He is ill-appearing.  HENT:     Head: Normocephalic and atraumatic.  Cardiovascular:     Heart sounds: Normal heart sounds.  Pulmonary:     Effort: No tachypnea, accessory muscle usage or respiratory distress.  Skin:    General: Skin is warm and dry.  Neurological:     Mental Status: He is easily aroused.     Comments: Wakes to voice, oriented to person/place  Psychiatric:        Attention and Perception: He is inattentive.    Vital Signs: BP 105/67 (BP Location: Right Arm)   Pulse 84   Temp 97.8 F (36.6 C) (Oral)   Resp 22   Ht _0  (1.854 m) Comment: per previous charting  Wt 76.5 kg   SpO2 100%   BMI 22.25 kg/m  Pain  Scale: 0-10   Pain  Score: 0-No pain   SpO2: SpO2: 100 % O2 Device:SpO2: 100 % O2 Flow Rate: .   IO: Intake/output summary:   Intake/Output Summary (Last 24 hours) at 07/15/2020 1539 Last data filed at 07/15/2020 1500 Gross per 24 hour  Intake 2179.72 ml  Output 100 ml  Net 2079.72 ml    LBM: Last BM Date: 07/14/20 Baseline Weight: Weight: 74.5 kg Most recent weight: Weight: 76.5 kg     Palliative Assessment/Data: PPS 40%   Flowsheet Rows     Most Recent Value  Intake Tab  Referral Department Hospitalist  Unit at Time of Referral Med/Surg Unit  Palliative Care Primary Diagnosis Cancer  Palliative Care Type New Palliative care  Reason for referral Clarify Goals of Care  Date first seen by Palliative Care 07/15/20  Clinical Assessment  Palliative Performance Scale Score 40%  Psychosocial & Spiritual Assessment  Palliative Care Outcomes  Patient/Family meeting held? Yes  Who was at the meeting? daughter  Palliative Care Outcomes Clarified goals of care, Counseled regarding hospice, Provided end of life care assistance, Provided psychosocial or spiritual support, Linked to palliative care logitudinal support, ACP counseling assistance      Time In: 1420 Time Out: 1530 Time Total: 70 Greater than 50%  of this time was spent counseling and coordinating care related to the above assessment and plan.  Signed by:  Ihor Dow, DNP, FNP-C Palliative Medicine Team  Phone: [phone] Fax: [phone]   Please contact Palliative Medicine Team phone at [phone] for questions and concerns.  For individual provider: See Shea Evans

## 2020-07-16 DIAGNOSIS — C7A1 Malignant poorly differentiated neuroendocrine tumors: Secondary | ICD-10-CM | POA: Diagnosis not present

## 2020-07-16 DIAGNOSIS — C787 Secondary malignant neoplasm of liver and intrahepatic bile duct: Secondary | ICD-10-CM | POA: Diagnosis not present

## 2020-07-16 MED ORDER — LACTULOSE 10 GM/15ML PO SOLN
10.0000 g | Freq: Two times a day (BID) | ORAL | Status: DC
  Administered 2020-07-16 – 2020-07-18 (×4): 10 g via ORAL
  Filled 2020-07-16 (×5): qty 30

## 2020-07-16 MED ORDER — FOLIC ACID 1 MG PO TABS: 1.0000 mg | ORAL_TABLET | Freq: Every day | ORAL | Status: DC

## 2020-07-16 MED ORDER — ADULT MULTIVITAMIN W/MINERALS CH
1.0000 | ORAL_TABLET | Freq: Every day | ORAL | Status: DC
  Administered 2020-07-16 – 2020-07-18 (×3): 1 via ORAL
  Filled 2020-07-16 (×3): qty 1

## 2020-07-16 NOTE — Progress Notes (Signed)
PROGRESS NOTE  Tommy Conley  KDT:[id number] DOB: Jul 16, 1951 DOA: 07/12/2020 PCP: Erven Colla, DO  Outpatient Specialists: Oncology, Dr. Benay Conley Brief Narrative: DOMINIE Conley is a 69 y.o. 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   He had TURP 05/13/2020 and has had hematuria as well as poor appetite, weight loss, weakness and more recently increase in loose stools. He was admitted at [facility] 7/7 - 7/9 with Klebsiella UTI and hepatic encephalopathy which was treated with lactulose. Waxing and waning confusion, associated with lethargy, has continued and lactulose is limited by baseline diarrhea. He's been found to be hypotensive at RN visits and in the ED 7/16 after a fall when not using his walker, so atenolol was stopped.  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 Multifactorial. No evidence of focal infection. - Feel safest venue at discharge with most likely chance at rehabilitation is SNF, TOC consulted. - Dietitian consulted, supplement protein.  Dehydration, hypokalemia, hyponatremia, metabolic acidosis, AKI:  - Improved with IV fluids.   Hypophosphatemia:  - Supplemented. Pt did not allow blood draw today per report.  Neuroendocrine carcinoma of terminal ileum s/p resection 2012 metastatic to liver s/p xeloda/temzolomide 2013 with subsequent slight progression s/p Y-90 x3  (2013 x2, 2016), s/p lutathera 2019 with no change on restaging scan, and metastatic to bone (Dx 04/05/2020):  - Dr. Benay Conley referred patient for admission, will see again 7/26.  - Pt due for monthly sandostatin (started 2016), octreotide ordered as inpatient. - Palliative care discussions throughout admission. Will attempt rehabilitation at SNF with palliative care following.   Hepatic encephalopathy related to liver tumor burden: Ammonia confirmed to be mildly elevated at 40. TSH, B12 wnl.  - Continue rifaximin. Lactulose was stopped due to diarrhea which has improved after giving octreotide. Will restart lactulose at low dose due to persistent encephalopathy.   BPH and hematuria: s/p TURP 05/13/2020 by Dr. Karsten Ro. No current gross hematuria. - Continue flomax - Monitor bladder scans to r/o retention.   Folic acid deficiency:  - Supplementing  Chronic blood loss anemia: Mild, normocytic.  - Hgb is stable.  Thrombocytopenia:  - Possibly related to hepatic tumor burden. Will plan to stop heparin if <100k.  HTN: Cortisol sufficient - Hold atenolol as discontinued prior due to hypotension, likely attributable to hypovolemia.   DVT prophylaxis: Heparin Code Status: DNR, POA Family Communication: Daughter by phone 7/25, planning palliative care meeting 7/26. Disposition Plan:  Status is: Inpatient  Remains inpatient appropriate because:Unsafe d/c plan  Dispo: The patient is from: Home              Anticipated d/c is to: SNF with palliative care following.              Anticipated d/c date is: 1 day as authorization is being sought.              Patient currently is medically  stable to d/c.  Consultants:   Oncology  Palliative care medicine team  Procedures:   None  Antimicrobials:  None   Subjective: Diarrhea improved, no abd pain, did not eat breakfast.   Objective: Vitals:   07/15/20 1821 07/15/20 2119 07/16/20 0539 07/16/20 1317  BP: 107/73 105/69 105/73 106/66   Pulse: 86 78 82 84  Resp: 21 20 18 20   Temp: 98.3 F (36.8 C) 97.8 F (36.6 C) 97.8 F (36.6 C) 98.7 F (37.1 C)  TempSrc: Oral Oral Oral   SpO2: 99% 100% 97% 99%  Weight:      Height:        Intake/Output Summary (Last 24 hours) at 07/16/2020 1557 Last data filed at 07/16/2020 1500 Gross per 24 hour  Intake 2839.04 ml  Output 925 ml  Net 1914.04 ml   Filed Weights   07/14/20 0422 07/15/20 0500  Weight: 74.5 kg 76.5 kg   Gen: 69 y.o. male in no distress Pulm: Nonlabored breathing room air. Clear. CV: Regular rate and rhythm. No murmur, rub, or gallop. No JVD, no dependent edema. GI: Abdomen soft, non-tender, non-distended, with normoactive bowel sounds.  Ext: Warm, no deformities Skin: No rashes, lesions or ulcers on visualized skin. Neuro: Alert, oriented to self only, conversant in short sentences, not lethargic. No asterixis or focal neurological deficits. Psych: Judgement and insight appear impaired.   Data Reviewed: I have personally reviewed following labs and imaging studies  CBC: Recent Labs  Lab 07/11/20 1334 07/12/20 2232 07/13/20 0847 07/14/20 1602 07/15/20 0542  WBC 7.3 6.8 6.3 7.9 6.8  NEUTROABS 4.6  --  4.0 5.3 4.3  HGB 13.4 13.1 12.5* 12.4* 12.9*  HCT 39.9 40.0 38.8* 39.4 39.3  MCV 88.1 91.5 91.5 93.6 89.7  PLT 186 157 154 123* 272*   Basic Metabolic Panel: Recent Labs  Lab 07/11/20 1334 07/12/20 2232 07/13/20 0847 07/14/20 1602 07/15/20 0542  NA 135 136 134* 134* 133*  K 3.2* 3.7 3.4* 4.2 3.6  CL 108 106 107 109 109  CO2 19* 18* 20* 16* 17*  GLUCOSE 125* 83 81 102* 92  BUN 30* 33* 31* 25* 24*  CREATININE 1.32* 1.43* 1.35* 0.93 0.87  CALCIUM 9.1 9.1 8.5* 8.5* 8.6*  MG  --   --   --   --  2.4  PHOS  --   --   --   --  1.9*   Liver Function Tests: Recent Labs  Lab 07/11/20 1334 07/12/20 2232 07/13/20 0847  AST 39 38 40  ALT 51* 44 45*  ALKPHOS 280* 237* 234*  BILITOT 1.2 1.0 1.1  PROT 6.4* 5.9* 5.3*  ALBUMIN 3.2* 2.8* 2.6*     Recent Labs  Lab 07/12/20 2232  AMMONIA 40*   CBG: Recent Labs  Lab 07/13/20 0034 07/13/20 0119  GLUCAP 66* 80   Anemia Panel: Recent Labs    07/15/20 0542  FOLATE 5.8*  FERRITIN 253  TIBC 170*  IRON 53   Urine analysis:    Component Value Date/Time   COLORURINE YELLOW 07/13/2020 2215   APPEARANCEUR CLEAR 07/13/2020 2215   LABSPEC 1.016 07/13/2020 2215   PHURINE 6.0 07/13/2020 2215   GLUCOSEU NEGATIVE 07/13/2020 2215   HGBUR LARGE (A) 07/13/2020 2215   BILIRUBINUR NEGATIVE 07/13/2020 2215   BILIRUBINUR +++ 02/27/2020 1359   KETONESUR NEGATIVE 07/13/2020 2215   PROTEINUR 100 (A) 07/13/2020 2215   UROBILINOGEN 0.2 09/23/2011 1111   NITRITE NEGATIVE 07/13/2020 2215   LEUKOCYTESUR SMALL (A) 07/13/2020 2215  Scheduled Meds: . feeding supplement  1 Container Oral Q breakfast  . feeding supplement (ENSURE ENLIVE)  237 mL Oral BID BM  . folic acid  1 mg Oral Daily  . heparin  5,000 Units Subcutaneous Q8H  . multivitamin with minerals  1 tablet Oral Daily  . octreotide  100 mcg Subcutaneous BID  . potassium & sodium phosphates  1 packet Oral TID WC & HS  . rifaximin  550 mg Oral BID  . tamsulosin  0.4 mg Oral QHS   Continuous Infusions: . dextrose 5% lactated ringers with KCl 20 mEq/L 100 mL/hr at 07/16/20 1407     LOS: 4 days   Time spent: 25 minutes.  Patrecia Pour, MD [facility] [email or web address] 07/16/2020, 3:57 PM

## 2020-07-16 NOTE — Progress Notes (Addendum)
HEMATOLOGY-ONCOLOGY PROGRESS NOTE  SUBJECTIVE: Resting quietly the time of my visit but awakens to voice.  Oriented to person only today.  Reports diarrhea has resolved.  PHYSICAL EXAMINATION:  Vitals:   07/15/20 2119 07/16/20 0539  BP: 105/69 105/73  Pulse: 78 82  Resp: 20 18  Temp: 97.8 F (36.6 C) 97.8 F (36.6 C)  SpO2: 100% 97%   Filed Weights   07/14/20 0422 07/15/20 0500  Weight: 74.5 kg 76.5 kg    Intake/Output from previous day: 07/26 0701 - 07/27 0700 In: 1962.4 [I.V.:1962.4] Out: 375 [Urine:375]  GENERAL: Awake and alert, no distress OROPHARYNX: No thrush or mucositis LUNGS: clear to auscultation and percussion with normal breathing effort HEART: regular rate & rhythm and no murmurs and no lower extremity edema ABDOMEN:abdomen soft, non-tender and normal bowel sounds  NEURO: Alert, oriented to person only  LABORATORY DATA:  I have reviewed the data as listed CMP Latest Ref Rng & Units 07/15/2020 07/14/2020 07/13/2020  Glucose 70 - 99 mg/dL 92 102(H) 81  BUN 8 - 23 mg/dL 24(H) 25(H) 31(H)  Creatinine 0.61 - 1.24 mg/dL 0.87 0.93 1.35(H)  Sodium 135 - 145 mmol/L 133(L) 134(L) 134(L)  Potassium 3.5 - 5.1 mmol/L 3.6 4.2 3.4(L)  Chloride 98 - 111 mmol/L 109 109 107  CO2 22 - 32 mmol/L 17(L) 16(L) 20(L)  Calcium 8.9 - 10.3 mg/dL 8.6(L) 8.5(L) 8.5(L)  Total Protein 6.5 - 8.1 g/dL - - 5.3(L)  Total Bilirubin 0.3 - 1.2 mg/dL - - 1.1  Alkaline Phos 38 - 126 U/L - - 234(H)  AST 15 - 41 U/L - - 40  ALT 0 - 44 U/L - - 45(H)    Lab Results  Component Value Date   WBC 6.8 07/15/2020   HGB 12.9 (L) 07/15/2020   HCT 39.3 07/15/2020   MCV 89.7 07/15/2020   PLT 119 (L) 07/15/2020   NEUTROABS 4.3 07/15/2020    CT Head Wo Contrast  Result Date: 06/26/2020 CLINICAL DATA:  Hypotension and lethargy EXAM: CT HEAD WITHOUT CONTRAST TECHNIQUE: Contiguous axial images were obtained from the base of the skull through the vertex without intravenous contrast. COMPARISON:  None.  FINDINGS: Brain: Diffuse atrophic changes are noted. Findings of chronic white matter ischemic change are seen. No findings to suggest acute hemorrhage, acute infarction or space-occupying mass lesion are noted. Vascular: No hyperdense vessel or unexpected calcification. Skull: Normal. Negative for fracture or focal lesion. Sinuses/Orbits: No acute finding. Other: None. IMPRESSION: Chronic atrophic and ischemic changes without acute abnormality. Electronically Signed   By: Inez Catalina M.D.   On: 06/26/2020 01:03   CT Abdomen Pelvis W Contrast  Result Date: 06/26/2020 CLINICAL DATA:  Known metastatic cecal carcinoma with abdominal distension EXAM: CT ABDOMEN AND PELVIS WITH CONTRAST TECHNIQUE: Multidetector CT imaging of the abdomen and pelvis was performed using the standard protocol following bolus administration of intravenous contrast. CONTRAST:  162mL OMNIPAQUE IOHEXOL 300 MG/ML  SOLN COMPARISON:  04/05/2020 PET-CT FINDINGS: Lower chest: No acute abnormality. Hepatobiliary: Multiple enhancing lesions are noted throughout the liver consistent with metastatic disease. Largest of these measures 16 cm in dimension. Gallbladder is within normal limits. Changes of prior embolotherapy in the gastro duodenal artery are seen. Pancreas: Unremarkable. No pancreatic ductal dilatation or surrounding inflammatory changes. Spleen: Normal in size without focal abnormality. Adrenals/Urinary Tract: Adrenal glands are within normal limits. Kidneys demonstrate a normal enhancement pattern bilaterally. Normal excretion is noted from the kidneys bilaterally. The bladder is decompressed Stomach/Bowel: Changes of prior right  colonic surgery are noted. Ileocolic anastomosis is noted without obstructive change. Vascular/Lymphatic: Aortic atherosclerosis. No enlarged abdominal or pelvic lymph nodes. Reproductive: Prostate is mildly prominent indenting upon the bladder. This is stable from prior PET-CT. Other: No abdominal wall hernia  or abnormality. No abdominopelvic ascites. Musculoskeletal: Increased sclerosis is noted within the sacrum which corresponds to a metastatic lesion seen on prior PET-CT. Some scattered sclerotic lesions are noted within the spine consistent with metastatic disease. IMPRESSION: Changes consistent with diffuse hepatic metastatic disease similar to that seen on prior PET-CT. Bony metastatic disease is noted. No acute abnormality is noted correspond with the patient's given clinical history. Electronically Signed   By: Inez Catalina M.D.   On: 06/26/2020 01:58   DG Chest Port 1 View  Result Date: 06/25/2020 CLINICAL DATA:  Weakness EXAM: PORTABLE CHEST 1 VIEW COMPARISON:  09/23/2011 FINDINGS: Cardiac shadow is at the upper limits of normal in size. The lungs are well aerated bilaterally. No focal infiltrate is seen. No bony abnormality is noted. IMPRESSION: No acute abnormality noted. Electronically Signed   By: Inez Catalina M.D.   On: 06/25/2020 22:00   DG Abd Portable 1V  Result Date: 07/12/2020 CLINICAL DATA:  Abdominal pain and hematuria. History of prostate surgery. EXAM: PORTABLE ABDOMEN - 1 VIEW COMPARISON:  Pelvic CT 06/26/2020. FINDINGS: The bowel gas pattern is nonobstructive. There is no supine evidence of free intraperitoneal air. Embolization coils and bowel anastomosis clips are present within the mid abdomen. Stable small right pelvic calcifications consistent with phleboliths. No acute osseous findings. IMPRESSION: No acute abdominal findings. Electronically Signed   By: Richardean Sale M.D.   On: 07/12/2020 18:39    ASSESSMENT AND PLAN: 1. 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  1. Status post a right colon/ileum resection on 09/29/2011.  2. Wedge biopsy of a right liver lesion on 09/29/2011 confirmed metastatic well-differentiated neuroendocrine carcinoma.  3. CT of the abdomen 08/25/2011 consistent with  multiple liver metastases.  4. Elevated preoperative chromogranin A level and a 24-hour urine 5-HIAA level. Stable on repeat 11/16/2011. 5. Restaging CT on 12/28/2011 confirmed slight progression of the metastatic liver lesions. 6. Status post 3 cycles of Xeloda/temozolomide with cycle 3 initiated on 03/12/2012 7. Restaging CT of the abdomen 04/04/2012 revealed slight progression of the metastatic liver lesions and transverse mesocolon adenopathy. 8. Status post Y-90 radioembolization of the right liver on 05/19/2012. 9. Status post Y-90 radioembolization of the left liver on 07/06/2012. 10. Chromogranin A level decreased on 08/29/2012, 24-hour urine 5 HIAA slightly increased on 09/05/2012 11. Restaging CT on 12/05/2012 with a mixed response in the liver 12. Restaging CT 08/11/2013-mild increase in the size of liver lesions. 13. Restaging CT 01/22/2014-stable to slightly improved liver lesions, mild increase in mesenteric adenopathy. Chromogranin A level stable. 14. Chromogranin A level stable 05/01/2014. 15. Chromogranin A higher on 08/02/2014 16. Restaging CT 11/06/2014 with interval enlargement of the enhancing lesions within the left and right hepatic lobe. 17. Initiation of monthly Sandostatin 12/24/2014 18. Status post Y-90 radioembolization of the right liver on 01/03/2015 19. Chromogranin A level stable 02/18/2015 20. Chromogranin A level improved 08/12/2015 21. Restaging CT 09/11/2015 with mild increase in the size of the dominant liver metastasis in the right hepatic lobe. Other smaller liver metastases showed no significant change. Stable mild lymphadenopathy in central small bowel mesentery and right cardiophrenic angle. Stable subcm right middle lobe pulmonary nodule. 22. Monthly Sandostatin continued 23. Gallium-DOTATATE01/25/2018-increased activity of  a dominant right liver mass and left liver lesion. Increased tracer accumulation in a mesenteric mass 24. Cycle 1Lutathera  03/30/2018 25. Cycle 2 Lutathera 05/25/2018 26. Cycle 3 Lutathera 07/20/2018 27. Cycle 4 Lutathera 09/14/2018 28. Restaging Netspot 10/18/2018-current scan very similar to pretherapy dotatate PET scan. No evidence of new or progressive neuroendocrine tumor. No enlarged tumor sites. 75. Netspot 04/05/2020-new FDG avid liver lesions, new bone metastases, enlarged prostate 2. History of diarrhea (3 to 4 times per day), predating surgery for 8 months. Decrease in daily bowel movements coinciding with discontinuation of Nexium. 3. Hypertension. 4. History of gastroesophageal reflux disease. No reflux symptoms since discontinuing Nexium. 5. Rectal and ascending colon polyps. He underwent a colonoscopy on 12/20/2012 with findings of a sessile polyp in the ascending colon and a sessile polyp in the rectum. Pathology showed hyperplastic polyps. Prior right colon resection noted with a normal appearing ileocolonic anastomosis. Small internal hemorrhoids noted. Repeat colonoscopy in 5 years. 6. History of Iron deficiency anemia 7. 08/12/2016 syncopal episode status post evaluation in the emergency department 8. Left-sided hearing loss July 2020 9. Urinary retention March 2021-followed at the urology center, TUR5/24/2021   [name] appears stable.  Diarrhea has resolved with addition of Sandostatin.  He is alert but still continues to have some confusion.  This may possibly be hepatic encephalopathy.   Recommendations: 1.  Awaiting SNF placement 2.  Continue Sandostatin 3.  Recommend GI consult for possible hepatic encephalopathy.   LOS: 4 days   Mikey Bussing, DNP, AGPCNP-BC, AOCNP 07/16/20 Mr. Cybulski was interviewed and examined.  The diarrhea resolved.  It is unclear whether this is due to stopping the lactulose or adding Sandostatin.  He will continue Sandostatin.  We will resume the Depo Sandostatin and discharge.  He remains confused.  I suspect this is due to hepatic encephalopathy,  potentially related to the extensive tumor burden in the liver.  He is not a candidate for additional treatment of the carcinoid tumor unless his mental status improves.  Julieanne Manson, MD

## 2020-07-16 NOTE — NC FL2 (Signed)
[place] MEDICAID FL2 LEVEL OF CARE SCREENING TOOL     IDENTIFICATION  Patient Name: Tommy Conley Birthdate: 15-Jun-1951 Sex: male Admission Date (Current Location): 07/12/2020  [facility] and [place] Number:  Herbalist and Address:  [facility],  [place], [place]      Provider Number: [id number]  Attending Physician Name and Address:  Patrecia Pour, MD  Relative Name and Phone Number:  Deakon, Frix Daughter [phone]  [phone]    Current Level of Care: Hospital Recommended Level of Care: [place] Prior Approval Number:    Date Approved/Denied:   PASRR Number: [id number]  Discharge Plan: SNF    Current Diagnoses: Patient Active Problem List   Diagnosis Date Noted  . Diarrhea   . Palliative care by specialist   . Goals of care, counseling/discussion   . Altered mental state 07/12/2020  . Hypotension 07/12/2020  . Failure to thrive (0-17) 07/12/2020  . Hematuria 07/12/2020  . Acute hepatic encephalopathy 06/28/2020  . Acute metabolic encephalopathy due to GNR UTI 06/27/2020  . Acute lower GNR UTI 06/27/2020  . Generalized weakness 06/26/2020  . Dehydration 06/26/2020  . AKI (acute kidney injury) ([place]) 06/26/2020  . BPH without urinary obstruction 05/13/2020  . Malignant carcinoid tumors of other sites ([place]) 08/25/2016  . Rectal pain 05/05/2016  . Irritable bowel syndrome 04/29/2015  . Essential hypertension, benign 04/24/2013  . Erectile dysfunction 04/24/2013  . Esophageal reflux 04/24/2013  . 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 10/13/2011    Orientation RESPIRATION BLADDER Height & Weight     Self, Situation, Place  Normal Incontinent Weight: 168 lb 10.4 oz (76.5 kg) Height:  6\' 1"  (185.4 cm) (per previous charting)  BEHAVIORAL SYMPTOMS/MOOD NEUROLOGICAL BOWEL NUTRITION STATUS      Incontinent Diet (Regular diet)  AMBULATORY STATUS  COMMUNICATION OF NEEDS Skin   Limited Assist Verbally Normal                       Personal Care Assistance Level of Assistance  Bathing, Feeding, Dressing Bathing Assistance: Limited assistance Feeding assistance: Limited assistance Dressing Assistance: Limited assistance     Functional Limitations Info  Sight, Speech, Hearing Sight Info: Adequate Hearing Info: Adequate Speech Info: Adequate    SPECIAL CARE FACTORS FREQUENCY  PT (By licensed PT), OT (By licensed OT)     PT Frequency: Minimum 5x a week OT Frequency: Minimum 5x a week            Contractures Contractures Info: Not present    Additional Factors Info  Code Status, Allergies Code Status Info: DNR Allergies Info: Vasotec           Current Medications (07/16/2020):  This is the current hospital active medication list Current Facility-Administered Medications  Medication Dose Route Frequency Provider Last Rate Last Admin  . dextrose 5% in lactated ringers with KCl 20 mEq/L infusion   Intravenous Continuous Patrecia Pour, MD 100 mL/hr at 07/16/20 1407 New Bag at 07/16/20 1407  . feeding supplement (BOOST / RESOURCE BREEZE) liquid 1 Container  1 Container Oral Q breakfast Patrecia Pour, MD   1 Container at 07/16/20 [phone]  . feeding supplement (ENSURE ENLIVE) (ENSURE ENLIVE) liquid 237 mL  237 mL Oral BID BM Patrecia Pour, MD   237 mL at 07/16/20 1303  . folic acid (FOLVITE) tablet 1 mg  1 mg Oral Daily Patrecia Pour,  MD   1 mg at 07/16/20 0951  . heparin injection 5,000 Units  5,000 Units Subcutaneous Q8H Oretha Milch D, MD   5,000 Units at 07/16/20 1302  . multivitamin with minerals tablet 1 tablet  1 tablet Oral Daily Vance Gather B, MD      . octreotide (SANDOSTATIN) injection 100 mcg  100 mcg Subcutaneous BID Ladell Pier, MD   100 mcg at 07/16/20 0818  . ondansetron (ZOFRAN) tablet 4 mg  4 mg Oral Q6H PRN Oretha Milch D, MD       Or  . ondansetron (ZOFRAN) injection 4 mg  4 mg Intravenous Q6H  PRN Oretha Milch D, MD      . polyethylene glycol (MIRALAX / GLYCOLAX) packet 17 g  17 g Oral Daily PRN Oretha Milch D, MD      . potassium & sodium phosphates (PHOS-NAK) 280-160-250 MG packet 1 packet  1 packet Oral TID WC & HS Patrecia Pour, MD   1 packet at 07/16/20 1300  . rifaximin (XIFAXAN) tablet 550 mg  550 mg Oral BID Patrecia Pour, MD   550 mg at 07/16/20 0951  . tamsulosin (FLOMAX) capsule 0.4 mg  0.4 mg Oral QHS Patrecia Pour, MD   0.4 mg at 07/15/20 2136  . traMADol (ULTRAM) tablet 50 mg  50 mg Oral Q8H PRN Desiree Hane, MD         Discharge Medications: Please see discharge summary for a list of discharge medications.  Relevant Imaging Results:  Relevant Lab Results:   Additional Information SSN [id number]  Ross Ludwig, LCSW

## 2020-07-16 NOTE — Progress Notes (Signed)
Nutrition Follow-up  DOCUMENTATION CODES:   Not applicable  INTERVENTION:  - continue Boost Breeze once/day, Ensure Enlive BID, and Magic Cup BID.  Monitor magnesium, potassium, and phosphorus daily for at least 3 days, MD to replete as needed, as pt is at risk for refeeding syndrome given very high risk for malnutrition, current hypophosphatemia with the initiation of D5.   NUTRITION DIAGNOSIS:   Inadequate oral intake related to cancer and cancer related treatments as evidenced by per patient/family report, percent weight loss (per chart, poor po intake and chronic diarrhea x 1 month). -ongoing  GOAL:   Patient will meet greater than or equal to 90% of their needs -unmet  MONITOR:   PO intake, Supplement acceptance, Labs, Weight trends  REASON FOR ASSESSMENT:   Consult Poor PO  ASSESSMENT:   69 year old male with medical history significant for neuroendocrine carcinoma with metastatic hepatic/bone disease, BPH s/p TURP (05/21), Barrett's esophagus, GERD, IBS, recent hospital admission 7/07-7/09 for treatment of Klebsiella UTI recommended for hospital admission after being seen at oncologist office on 7/22 for evaluation of AMS and rule out hepatic encephalopathy related to large liver tumor burden.  The only documented intake is 0% of breakfast yesterday. Per review of orders, he has accepted Boost Breeze 2 of the 3 times offered and Ensure 4 of the 7 times offered.   Patient is noted to be a/o to self only. He was sleeping when RD entered the room. No family/visitors present. Patient did awake to name call x1 and touching his hand.   NFPE outlined below. Do not feel comfortable stating a degree of malnutrition as PO intakes PTA are unknown and due to NFPE findings. Patient is at very high risk for malnutrition.   Labs reviewed; CBGs: 66 and 80 mg/dl, Na: 133 mmol/l, BUN: 24 mg/dl, Ca: 8.6 mg/dl, Phos: 1.9 mg/dl. Medications reviewed; 1 mg folvite/day, 100 mcg subcutaneous  sandostatin BID, 1 packet Phos-nak TID.  IVF; D5-LR-20 mEq IV KCl @ 100 ml/hr (408 kcal).   NUTRITION - FOCUSED PHYSICAL EXAM:    Most Recent Value  Orbital Region No depletion  Upper Arm Region Mild depletion  Thoracic and Lumbar Region No depletion  Buccal Region No depletion  Temple Region No depletion  Clavicle Bone Region Moderate depletion  Clavicle and Acromion Bone Region Mild depletion  Scapular Bone Region Unable to assess  Dorsal Hand Mild depletion  Patellar Region No depletion  Anterior Thigh Region Unable to assess  Posterior Calf Region No depletion  Edema (RD Assessment) Mild  [BLE]  Hair Reviewed  Eyes Unable to assess  Mouth Unable to assess  Skin Reviewed  Nails Reviewed       Diet Order:   Diet Order            Diet regular Room service appropriate? Yes; Fluid consistency: Thin  Diet effective now                 EDUCATION NEEDS:   Not appropriate for education at this time  Skin:  Skin Assessment: Reviewed RN Assessment  Last BM:  7/27  Height:   Ht Readings from Last 1 Encounters:  07/14/20 6\' 1"  (1.854 m)    Weight:   Wt Readings from Last 1 Encounters:  07/15/20 76.5 kg     Estimated Nutritional Needs:  Kcal:  7829-5621 Protein:  115-125 Fluid:  >/= 2.3 L     Jarome Matin, MS, RD, LDN, CNSC Inpatient Clinical Dietitian RD pager # available  in [facility]  After hours/weekend pager # available in [facility]

## 2020-07-16 NOTE — Discharge Instructions (Signed)
Abdominal Pain, Adult Many things can cause belly (abdominal) pain. Most times, belly pain is not dangerous. Many cases of belly pain can be watched and treated at home. Sometimes, though, belly pain is serious. Your doctor will try to find the cause of your belly pain. Follow these instructions at home:  Medicines  Take over-the-counter and prescription medicines only as told by your doctor.  Do not take medicines that help you poop (laxatives) unless told by your doctor. General instructions  Watch your belly pain for any changes.  Drink enough fluid to keep your pee (urine) pale yellow.  Keep all follow-up visits as told by your doctor. This is important. Contact a doctor if:  Your belly pain changes or gets worse.  You are not hungry, or you lose weight without trying.  You are having trouble pooping (constipated) or have watery poop (diarrhea) for more than 2-3 days.  You have pain when you pee or poop.  Your belly pain wakes you up at night.  Your pain gets worse with meals, after eating, or with certain foods.  You are vomiting and cannot keep anything down.  You have a fever.  You have blood in your pee. Get help right away if:  Your pain does not go away as soon as your doctor says it should.  You cannot stop vomiting.  Your pain is only in areas of your belly, such as the right side or the left lower part of the belly.  You have bloody or black poop, or poop that looks like tar.  You have very bad pain, cramping, or bloating in your belly.  You have signs of not having enough fluid or water in your body (dehydration), such as: ? Dark pee, very little pee, or no pee. ? Cracked lips. ? Dry mouth. ? Sunken eyes. ? Sleepiness. ? Weakness.  You have trouble breathing or chest pain. Summary  Many cases of belly pain can be watched and treated at home.  Watch your belly pain for any changes.  Take over-the-counter and prescription medicines only as  told by your doctor.  Contact a doctor if your belly pain changes or gets worse.  Get help right away if you have very bad pain, cramping, or bloating in your belly. This information is not intended to replace advice given to you by your health care provider. Make sure you discuss any questions you have with your health care provider. Document Revised: 04/17/2019 Document Reviewed: 04/17/2019 Elsevier Patient Education  [place].   Carcinoid Tumors A carcinoid tumor is a rare type of cancer that usually grows very slowly. These tumors can grow in any area of the body where there are cells that receive signals from the nervous system (neuroendocrine cells). Carcinoid tumors are also called neuroendocrine tumors. Some of these tumors may secrete chemical messengers (hormones). The small intestine is the most common place for carcinoid tumors to grow. They may also grow in other areas of the digestive system or in the lungs. There are three types of carcinoid tumors:  Slow-growing tumors are the most common type. This type of tumor may not cause symptoms for many years.  Faster-growing carcinoid tumors may spread to other areas of the body, including the liver. This type causes symptoms sooner.  Hormone-secreting tumors cause symptoms because of the hormones they secrete. These symptoms are called carcinoid syndrome. What are the causes? Cancer results when healthy cells change and start to grow out of control. These cells  form tumors. Exactly why this happens is not known. What increases the risk? You are more likely to develop this condition if:  You are 21-74 years old.  You are male.  You are African American.  You have a family history of a condition called multiple endocrine neoplasia type 1 (MEN 1).  You have a stomach condition that reduces the production of stomach acid, such as pernicious anemia, atrophic gastritis, or Zollinger-Ellison syndrome.  You are a  smoker. What are the signs or symptoms? Slow-growing carcinoid tumors do not cause symptoms for many years. When symptoms do develop, they depend on the location of the tumor and whether the tumor secretes hormones that cause carcinoid syndrome. In some cases, the symptoms of carcinoid syndrome can be triggered by stress, exercise, alcohol (especially red wine), and some foods (especially cheese and chocolate). Symptoms of carcinoid syndrome may include:  Flushing of the face and neck.  Recurrent diarrhea.  Shortness of breath or wheezing.  Fast or irregular heartbeats (palpitations).  Swelling of the feet and ankles.  Fainting. Symptoms of digestive system carcinoids may include:  Belly pain.  Diarrhea or constipation.  Inability to have a bowel movement.  Nausea or vomiting.  Rectal bleeding. Symptoms of lung carcinoids may include:  Chest pain.  Shortness of breath.  Cough, sometimes with blood. How is this diagnosed? Carcinoid tumors that do not cause symptoms may be found during imaging tests or procedures done to diagnose other conditions. If your health care provider suspects a carcinoid tumor, you may have tests to confirm the diagnosis. These may include:  A 24-hour urine sample.  Blood tests.  A type of imaging scan that is done after you get an injection of a radioactive dye that will go to a carcinoid tumor (octreotide scan).  Other imaging tests, such as an X-ray, MRI, PET, or CT scan.  A procedure to remove a sample of the tumor (biopsy) for testing. How is this treated? Treatment depends on the size and location of the tumor.  For a small tumor that has not spread to other areas of the body, surgery can be done to remove the tumor. This is the most common treatment and usually cures this type of cancer.  For tumors that are too big to remove or have spread to other parts of the body, treatment options may include: ? Surgically removing part of the  tumor to make other treatments more effective. ? Taking medicines such as:  A medicine made from a naturally occurring hormone (octreotide). Injections of octreotide may reduce symptoms and reverse tumor growth.  A medicine that boosts the immune system (interferon). This medicine may be used to increase natural resistance to cancer.  Several types of cancer drugs (chemotherapy). These may be given through an IV or taken by mouth in various combinations.  Targeted therapy. This involves using drugs that target specific parts of cancer cells and the area around them to block the growth and spread of the cancer. ? Using high-energy rays to kill cancer cells (radiation therapy). This may be used for some carcinoid tumors.  For carcinoid cancer that has spread to the liver, treatment may include having a procedure that involves using radio waves that destroy cancer, either with heat (radiofrequency ablation) or with freezing (cryoablation). Pellets may be injected to block blood supply to the tumor (embolization). Follow these instructions at home:      If you had surgery, follow all your home care instructions as told by your  health care provider.  Take over-the-counter and prescription medicines only as told by your health care provider.  Follow diet instructions from your health care provider. This may include adding more protein to your diet. You may also need to avoid any foods that trigger your symptoms.  Do not drink alcohol, especially red wine.  Do not use any products that contain nicotine or tobacco, such as cigarettes, e-cigarettes, and chewing tobacco. If you need help quitting, ask your health care provider.  Take actions to manage stress, such as doing meditation or deep breathing techniques.  Keep all follow-up visits as told by your health care provider. This is important. Contact a health care provider if you:  Develop any symptoms of carcinoid syndrome.  Develop any  new symptoms. Get help right away if you have:  Chest pain.  Trouble breathing. Summary  A carcinoid tumor is a rare type of cancer that usually grows very slowly.  Slow-growing carcinoid tumors do not cause symptoms for many years. When symptoms do occur, they depend on the location of the tumor and whether the tumor secretes hormones that cause carcinoid syndrome.  Carcinoid syndrome may cause flushing, wheezing, palpitations, diarrhea, swelling, and fainting.  You may need blood tests, imaging tests, and a biopsy to confirm a diagnosis of carcinoid tumors.  Surgery is the best treatment for a carcinoid tumor when it is still small. There are several other treatment options for tumors that are large or have spread to other areas of the body. This information is not intended to replace advice given to you by your health care provider. Make sure you discuss any questions you have with your health care provider. Document Revised: 08/11/2018 Document Reviewed: 08/11/2018 Elsevier Patient Education  [place].   Flank Pain, Adult Flank pain is pain in your side. The flank is the area of your side between your upper belly (abdomen) and your back. The pain may occur over a short time (acute), or it may be long-term or come back often (chronic). It may be mild or very bad. Pain in this area can be caused by many different things. Follow these instructions at home:   Drink enough fluid to keep your pee (urine) clear or pale yellow.  Rest as told by your doctor.  Take over-the-counter and prescription medicines only as told by your doctor.  Keep a journal to keep track of: ? What has caused your flank pain. ? What has made it feel better.  Keep all follow-up visits as told by your doctor. This is important. Contact a doctor if:  Medicine does not help your pain.  You have new symptoms.  Your pain gets worse.  You have a fever.  Your symptoms last longer than 2-3  days.  You have trouble peeing.  You are peeing more often than normal. Get help right away if:  You have trouble breathing.  You are short of breath.  Your belly hurts, or it is swollen or red.  You feel sick to your stomach (nauseous).  You throw up (vomit).  You feel like you will pass out, or you do pass out (faint).  You have blood in your pee. Summary  Flank pain is pain in your side. The flank is the area of your side between your upper belly (abdomen) and your back.  Flank pain may occur over a short time (acute), or it may be long-term or come back often (chronic). It may be mild or very bad.  Pain in this area can be caused by many different things.  Contact your doctor if your symptoms get worse or they last longer than 2-3 days. This information is not intended to replace advice given to you by your health care provider. Make sure you discuss any questions you have with your health care provider. Document Revised: 11/19/2017 Document Reviewed: 03/29/2017 Elsevier Patient Education  [place].   Hematuria, Adult Hematuria is blood in the urine. Blood may be visible in the urine, or it may be identified with a test. This condition can be caused by infections of the bladder, urethra, kidney, or prostate. Other possible causes include:  Kidney stones.  Cancer of the urinary tract.  Too much calcium in the urine.  Conditions that are passed from parent to child (inherited conditions).  Exercise that requires a lot of energy. Infections can usually be treated with medicine, and a kidney stone usually will pass through your urine. If neither of these is the cause of your hematuria, more tests may be needed to identify the cause of your symptoms. It is very important to tell your health care provider about any blood in your urine, even if it is painless or the blood stops without treatment. Blood in the urine, when it happens and then stops and then happens  again, can be a symptom of a very serious condition, including cancer. There is no pain in the initial stages of many urinary cancers. Follow these instructions at home: Medicines  Take over-the-counter and prescription medicines only as told by your health care provider.  If you were prescribed an antibiotic medicine, take it as told by your health care provider. Do not stop taking the antibiotic even if you start to feel better. Eating and drinking  Drink enough fluid to keep your urine clear or pale yellow. It is recommended that you drink 3-4 quarts (2.8-3.8 L) a day. If you have been diagnosed with an infection, it is recommended that you drink cranberry juice in addition to large amounts of water.  Avoid caffeine, tea, and carbonated beverages. These tend to irritate the bladder.  Avoid alcohol because it may irritate the prostate (men). General instructions  If you have been diagnosed with a kidney stone, follow your health care provider's instructions about straining your urine to catch the stone.  Empty your bladder often. Avoid holding urine for long periods of time.  If you are male: ? After a bowel movement, wipe from front to back and use each piece of toilet paper only once. ? Empty your bladder before and after sex.  Pay attention to any changes in your symptoms. Tell your health care provider about any changes or any new symptoms.  It is your responsibility to get your test results. Ask your health care provider, or the department performing the test, when your results will be ready.  Keep all follow-up visits as told by your health care provider. This is important. Contact a health care provider if:  You develop back pain.  You have a fever.  You have nausea or vomiting.  Your symptoms do not improve after 3 days.  Your symptoms get worse. Get help right away if:  You develop severe vomiting and are unable take medicine without vomiting.  You develop  severe pain in your back or abdomen even though you are taking medicine.  You pass a large amount of blood in your urine.  You pass blood clots in your urine.  You feel  very weak or like you might faint.  You faint. Summary  Hematuria is blood in the urine. It has many possible causes.  It is very important that you tell your health care provider about any blood in your urine, even if it is painless or the blood stops without treatment.  Take over-the-counter and prescription medicines only as told by your health care provider.  Drink enough fluid to keep your urine clear or pale yellow. This information is not intended to replace advice given to you by your health care provider. Make sure you discuss any questions you have with your health care provider. Document Revised: 05/03/2019 Document Reviewed: 01/09/2017 Elsevier Patient Education  2020 [employer].

## 2020-07-16 NOTE — Progress Notes (Signed)
PT Cancellation Note  Patient Details Name: COULTER OLDAKER MRN: [id number] DOB: 1951-07-25   Cancelled Treatment:    Reason Eval/Treat Not Completed: Patient declined,states that he is not going to get up today.    Claretha Cooper 07/16/2020, 3:57 PM  [place] Pager [phone] Office [phone]

## 2020-07-16 NOTE — Progress Notes (Signed)
Manufacturing engineer [facility]) Community Based Palliative Care     This patient is enrolled in our palliative care services in the community.  [facility] will continue to follow for any discharge planning needs and to coordinate continuation of palliative care.   If you have questions or need assistance, please call [phone] or contact the hospital Liaison listed on AMION.      Clementeen Hoof RN, BSN [facility] Liaison  [phone]

## 2020-07-16 NOTE — Care Management Important Message (Signed)
Important Message  Patient Details IM Letter presented to the Patient Name: Tommy Conley MRN: [id number] Date of Birth: 01/16/1951   Medicare Important Message Given:  Yes     Kerin Salen 07/16/2020, 3:45 PM

## 2020-07-17 DIAGNOSIS — C7B02 Secondary carcinoid tumors of liver: Secondary | ICD-10-CM

## 2020-07-17 DIAGNOSIS — K7290 Hepatic failure, unspecified without coma: Secondary | ICD-10-CM | POA: Diagnosis not present

## 2020-07-17 DIAGNOSIS — E860 Dehydration: Secondary | ICD-10-CM

## 2020-07-17 DIAGNOSIS — C7A012 Malignant carcinoid tumor of the ileum: Secondary | ICD-10-CM

## 2020-07-17 DIAGNOSIS — C7A021 Malignant carcinoid tumor of the cecum: Secondary | ICD-10-CM

## 2020-07-17 DIAGNOSIS — I959 Hypotension, unspecified: Secondary | ICD-10-CM

## 2020-07-17 DIAGNOSIS — N400 Enlarged prostate without lower urinary tract symptoms: Secondary | ICD-10-CM

## 2020-07-17 DIAGNOSIS — R319 Hematuria, unspecified: Secondary | ICD-10-CM

## 2020-07-17 DIAGNOSIS — R197 Diarrhea, unspecified: Secondary | ICD-10-CM | POA: Diagnosis not present

## 2020-07-17 DIAGNOSIS — R6251 Failure to thrive (child): Secondary | ICD-10-CM

## 2020-07-17 DIAGNOSIS — R4182 Altered mental status, unspecified: Secondary | ICD-10-CM | POA: Diagnosis not present

## 2020-07-17 DIAGNOSIS — C787 Secondary malignant neoplasm of liver and intrahepatic bile duct: Secondary | ICD-10-CM | POA: Diagnosis not present

## 2020-07-17 LAB — BASIC METABOLIC PANEL
Anion gap: 7 (ref 5–15)
BUN: 19 mg/dL (ref 8–23)
CO2: 18 mmol/L — ABNORMAL LOW (ref 22–32)
Calcium: 8.5 mg/dL — ABNORMAL LOW (ref 8.9–10.3)
Chloride: 111 mmol/L (ref 98–111)
Creatinine, Ser: 0.86 mg/dL (ref 0.61–1.24)
GFR calc Af Amer: >60 mL/min (ref >60)
GFR calc non Af Amer: >60 mL/min (ref >60)
Glucose, Bld: 109 mg/dL — ABNORMAL HIGH (ref 70–99)
Potassium: 4.5 mmol/L (ref 3.5–5.1)
Sodium: 136 mmol/L (ref 135–145)

## 2020-07-17 LAB — MAGNESIUM: Magnesium: 1.9 mg/dL (ref 1.7–2.4)

## 2020-07-17 LAB — PHOSPHORUS: Phosphorus: 2.1 mg/dL — ABNORMAL LOW (ref 2.5–4.6)

## 2020-07-17 NOTE — TOC Progression Note (Signed)
Transition of Care [facility]) - Progression Note    Patient Details  Name: JETTSON CRABLE MRN: [id number] Date of Birth: 1951/12/03  [facility]) CM/SW Contact  Ross Ludwig, [place] Phone Number: 07/17/2020, 11:04 AM  Clinical Narrative:     CSW received a phone call from patient's insurance company.  They have not received faxed clinical information.  CSW refaxed clinicals to [facility], awaiting for insurance authorization.  CSW updated patient's daughter [name] [phone], that patient has a bed at [facility] pending insurance authorization.  CSW informed her that CSW will continue to follow patient's progress throughout discharge planning.    Expected Discharge Plan: Skilled Nursing Facility Barriers to Discharge: Ship broker, Continued Medical Work up  Expected Discharge Plan and Services Expected Discharge Plan: [place] In-house Referral: Clinical Social Work   Post Acute Care Choice: [place] Living arrangements for the past 2 months: Single Family Home Expected Discharge Date: 07/16/20                 DME Agency: NA                   Social Determinants of Health (SDOH) Interventions    Readmission Risk Interventions No flowsheet data found.

## 2020-07-17 NOTE — Progress Notes (Signed)
PROGRESS NOTE    Tommy Conley  JTT:[id number] DOB: 07-28-1951 DOA: 07/12/2020 PCP: Erven Colla, DO   Brief Narrative: 69-year-old African-American male with a past medical history significant for but not limited to history of neuroendocrine carcinoma which is metastatic to liver and bone, BPH status post TURP in 2021 in May, history of hepatic encephalopathy who presented to his oncologist office on 07/12/2020 and was referred to admission to the hospital as a direct admission for failure to thrive.  Since his TURP in May 13, 2020 he has had hematuria as well as poor appetite, weight loss, weakness as well as more recently increasing loose stools.  He is admitted at [facility] from [phone] with Klebsiella UTI as well as hepatic encephalopathy treated with lactulose.  He continues to have waxing and waning confusion associated with lethargy and had discontinued lactulose limited by his baseline diarrhea.  Initially was found to be hypotensive at nursing visits and presented to the ED 07/05/2020 after a fall when not using his walker.  At that time his atenolol stopped.  Currently he has been admitted again for with weakness and the safest venue at discharge for him will be a skilled nursing facility and currently awaiting placement given insurance authorization.  Medical oncology has been consulted and they recommend a gastroenterology consult given his hepatic encephalopathy given his waxing and waning mental status.  His dehydration is improved as well as his hypokalemia, hyponatremia, metabolic acidosis and AKI with fluids.  GI will see the patient in the morning prior to discharging to skilled nursing facility.  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 Multifactorial. No evidence of focal infection. - Feel safest venue at discharge with most likely chance at rehabilitation is SNF, TOC consulted. -Dietitian consulted, supplement protein. 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 Benay Spice referred patient for admission and is following - Pt due for monthly sandostatin (started 2016), octreotide ordered as inpatient. - Palliative care discussions throughout admission. Will attempt rehabilitation at SNF with palliative care following.  -Oncology recommends continuing 100 mcg of Sandostatin twice daily and will increase the frequency of statin statin therapy if his diarrhea persists and they are recommending a GI consult for probable hepatic encephalopathy  Hepatic encephalopathy related to liver tumor burden -Ammonia confirmed to be mildly elevated at 40. TSH, B12  wnl.  -Continue rifaximin. Lactulose was stopped due to diarrhea which has improved after giving octreotide. -Restart lactulose at low dose due to persistent encephalopathy and currently  is getting at 10 g twice daily.  -Medical oncology recommending GI consultation prior to discharging to SNF given his hepatic encephalopathy and have reached out and they will evaluate the patient tomorrow  BPH and hematuria -S/p TURP 05/13/2020 by Dr. Karsten Ro.  -No current gross hematuria. -Continue with tamsulosin 0.4 mg p.o. nightly -Monitor bladder scans to r/o retention.   Folic acid deficiency:  -Currently supplementing with folic acid 1 mg p.o. daily  Chronic blood loss anemia: Mild, normocytic.  -Hemoglobin/hematocrit is stable as it is 12.9/39.3 next-anemia panel done and showed an iron level of 53, U IBC 117, TIBC of 170, saturation ratios of 31%, ferritin level 253, folate level 5.8, and vitamin B12 891  Thrombocytopenia - Possibly related to hepatic tumor burden. Will plan to stop heparin if <100k. -Platelet count has slowly been dropping and went from 157 is 119 -Continue to monitor for signs and symptoms of bleeding; currently no overt bleeding noted -Repeat CBC in a.m.  HTN -Cortisol sufficient -Hold atenolol as discontinued prior due to hypotension, likely attributable to hypovolemia.   Hypophosphatemia  -patient's phosphorus level was 2.1 -Replete with Phos neck 1 packet p.o. 3 times daily with meals and bedtime -Monitor and replete as necessary -Repeat phosphorus level in the a.m.   DVT prophylaxis: Heparin 5000 units subcu every 8h Code Status: DO NOT RESUSCITATE  Family Communication: No family present at bedside Disposition Plan: Anticipating discharging to SNF in the next 24 to 48 hours after he is cleared by GI  Status is: Inpatient  Remains inpatient appropriate because:Persistent severe electrolyte disturbances, IV treatments appropriate due to intensity of illness or inability to take PO and Inpatient level of care appropriate due to severity of illness   Dispo:  Patient From: Home  Planned Disposition: [facility]  Expected  discharge date: 07/16/20  Medically stable for discharge: Yes  Consultants:   Medical Oncology  Gastroenterology    Procedures: None  Antimicrobials:  Anti-infectives (From admission, onward)   Start     Dose/Rate Route Frequency Ordered Stop   07/13/20 1830  rifaximin (XIFAXAN) tablet 550 mg     Discontinue     550 mg Oral 2 times daily 07/13/20 1735       Subjective: Seen and examined at bedside and patient was sitting in the chair and was a little confused.  No nausea or vomiting.  Felt okay.  Still having some diarrhea.  No other concerns or complaints at this time.  Objective: Vitals:   07/16/20 1317 07/16/20 2054 07/17/20 0427 07/17/20 1312  BP: 106/66 113/72 120/70 115/79  Pulse: 84 84 83 90  Resp: 20 20 18 16   Temp: 98.7 F (37.1 C) 97.7 F (36.5 C) 97.7 F (36.5 C) 97.6 F (36.4 C)  TempSrc:  Oral Oral Oral  SpO2: 99% 100% 100% 100%  Weight:   78.1 kg   Height:        Intake/Output Summary (Last 24 hours) at 07/17/2020 1813 Last data filed at 07/17/2020 0900 Gross per 24 hour  Intake 1466.53 ml  Output 150 ml  Net 1316.53 ml   Filed Weights   07/14/20 0422 07/15/20 0500 07/17/20 0427  Weight: 74.5 kg 76.5 kg 78.1 kg   Examination: Physical Exam:  Constitutional: Thin slightly disheveled chronically ill-appearing African-American male currently in NAD and appears calm  and comfortable Eyes: Lids and conjunctivae normal, sclerae anicteric  ENMT: External Ears, Nose appear normal. Grossly normal hearing.  Neck: Appears normal, supple, no cervical masses, normal ROM, no appreciable thyromegaly no JVD Respiratory: Diminished to auscultation bilaterally, no wheezing, rales, rhonchi or crackles. Normal respiratory effort and patient is not tachypenic. No accessory muscle use.  Unlabored breathing Cardiovascular: RRR, no murmurs / rubs / gallops. S1 and S2 auscultated.  Abdomen: Soft, non-tender, non-distended. Bowel sounds positive.  GU:  Deferred. Musculoskeletal: No clubbing / cyanosis of digits/nails. No joint deformity upper and lower extremities.  Skin: No rashes, lesions, ulcers on limited skin evaluation. No induration; Warm and dry.  Neurologic: CN 2-12 grossly intact with no focal deficits. Psychiatric:.  Impaired judgment and insight.  He is alert but not fully oriented x 3. Normal mood and appropriate affect.   Data Reviewed: I have personally reviewed following labs and imaging studies  CBC: Recent Labs  Lab 07/11/20 1334 07/12/20 2232 07/13/20 0847 07/14/20 1602 07/15/20 0542  WBC 7.3 6.8 6.3 7.9 6.8  NEUTROABS 4.6  --  4.0 5.3 4.3  HGB 13.4 13.1 12.5* 12.4* 12.9*  HCT 39.9 40.0 38.8* 39.4 39.3  MCV 88.1 91.5 91.5 93.6 89.7  PLT 186 157 154 123* 578*   Basic Metabolic Panel: Recent Labs  Lab 07/12/20 2232 07/13/20 0847 07/14/20 1602 07/15/20 0542 07/17/20 0613  NA 136 134* 134* 133* 136  K 3.7 3.4* 4.2 3.6 4.5  CL 106 107 109 109 111  CO2 18* 20* 16* 17* 18*  GLUCOSE 83 81 102* 92 109*  BUN 33* 31* 25* 24* 19  CREATININE 1.43* 1.35* 0.93 0.87 0.86  CALCIUM 9.1 8.5* 8.5* 8.6* 8.5*  MG  --   --   --  2.4 1.9  PHOS  --   --   --  1.9* 2.1*   GFR: Estimated Creatinine Clearance: 89.6 mL/min (by C-G formula based on SCr of 0.86 mg/dL). Liver Function Tests: Recent Labs  Lab 07/11/20 1334 07/12/20 2232 07/13/20 0847  AST 39 38 40  ALT 51* 44 45*  ALKPHOS 280* 237* 234*  BILITOT 1.2 1.0 1.1  PROT 6.4* 5.9* 5.3*  ALBUMIN 3.2* 2.8* 2.6*   No results for input(s): LIPASE, AMYLASE in the last 168 hours. Recent Labs  Lab 07/12/20 2232  AMMONIA 40*   Coagulation Profile: No results for input(s): INR, PROTIME in the last 168 hours. Cardiac Enzymes: No results for input(s): CKTOTAL, CKMB, CKMBINDEX, TROPONINI in the last 168 hours. BNP (last 3 results) No results for input(s): PROBNP in the last 8760 hours. HbA1C: No results for input(s): HGBA1C in the last 72 hours. CBG: Recent  Labs  Lab 07/13/20 0034 07/13/20 0119  GLUCAP 66* 80   Lipid Profile: No results for input(s): CHOL, HDL, LDLCALC, TRIG, CHOLHDL, LDLDIRECT in the last 72 hours. Thyroid Function Tests: No results for input(s): TSH, T4TOTAL, FREET4, T3FREE, THYROIDAB in the last 72 hours. Anemia Panel: Recent Labs    07/15/20 0542  FOLATE 5.8*  FERRITIN 253  TIBC 170*  IRON 53   Sepsis Labs: Recent Labs  Lab 07/12/20 2232  LATICACIDVEN 1.4    No results found for this or any previous visit (from the past 240 hour(s)).   RN Pressure Injury Documentation:     Estimated body mass index is 22.71 kg/m as calculated from the following:   Height as of this encounter: 6\' 1"  (1.854 m).   Weight as of this encounter: 78.1 kg.  Malnutrition  Type:  Nutrition Problem: Inadequate oral intake Etiology: cancer and cancer related treatments   Malnutrition Characteristics:  Signs/Symptoms: per patient/family report, percent weight loss (per chart, poor po intake and chronic diarrhea x 1 month) Percent weight loss: 24.6 % (52 lb x 3 months per wt history)   Nutrition Interventions:  Interventions: Ensure Enlive (each supplement provides 350kcal and 20 grams of protein), Magic cup, Boost Breeze   Radiology Studies: No results found.  Scheduled Meds:  feeding supplement  1 Container Oral Q breakfast   feeding supplement (ENSURE ENLIVE)  237 mL Oral BID BM   folic acid  1 mg Oral Daily   heparin  5,000 Units Subcutaneous Q8H   lactulose  10 g Oral BID   multivitamin with minerals  1 tablet Oral Daily   octreotide  100 mcg Subcutaneous BID   potassium & sodium phosphates  1 packet Oral TID WC & HS   rifaximin  550 mg Oral BID   tamsulosin  0.4 mg Oral QHS   Continuous Infusions:  dextrose 5% lactated ringers with KCl 20 mEq/L 100 mL/hr at 07/17/20 1035    LOS: 5 days   Kerney Elbe, DO [facility] PAGER is on [place]  If 7PM-7AM, please contact  night-coverage [email or web address]

## 2020-07-17 NOTE — Progress Notes (Signed)
HEMATOLOGY-ONCOLOGY PROGRESS NOTE  SUBJECTIVE: No complaint this morning.  He appears comfortable.  He denies diarrhea, but the nurse reported he is having diarrhea.   PHYSICAL EXAMINATION:  Vitals:   07/17/20 0427 07/17/20 1312  BP: 120/70 115/79  Pulse: 83 90  Resp: 18 16  Temp: 97.7 F (36.5 C) 97.6 F (36.4 C)  SpO2: 100% 100%   Filed Weights   07/14/20 0422 07/15/20 0500 07/17/20 0427  Weight: 164 lb 3.9 oz (74.5 kg) 168 lb 10.4 oz (76.5 kg) 172 lb 1.6 oz (78.1 kg)    Intake/Output from previous day: 07/27 0701 - 07/28 0700 In: 3031.6 [P.O.:777; I.V.:2254.6] Out: 700 [Urine:700]   GENERAL: Lethargic, arousable OROPHARYNX: No thrush or mucositis  ABDOMEN:abdomen soft, non-tender and normal bowel sounds  NEURO: Alert, not oriented to place, diagnosis, or year  LABORATORY DATA:  I have reviewed the data as listed CMP Latest Ref Rng & Units 07/17/2020 07/15/2020 07/14/2020  Glucose 70 - 99 mg/dL 109(H) 92 102(H)  BUN 8 - 23 mg/dL 19 24(H) 25(H)  Creatinine 0.61 - 1.24 mg/dL 0.86 0.87 0.93  Sodium 135 - 145 mmol/L 136 133(L) 134(L)  Potassium 3.5 - 5.1 mmol/L 4.5 3.6 4.2  Chloride 98 - 111 mmol/L 111 109 109  CO2 22 - 32 mmol/L 18(L) 17(L) 16(L)  Calcium 8.9 - 10.3 mg/dL 8.5(L) 8.6(L) 8.5(L)  Total Protein 6.5 - 8.1 g/dL - - -  Total Bilirubin 0.3 - 1.2 mg/dL - - -  Alkaline Phos 38 - 126 U/L - - -  AST 15 - 41 U/L - - -  ALT 0 - 44 U/L - - -    Lab Results  Component Value Date   WBC 6.8 07/15/2020   HGB 12.9 (L) 07/15/2020   HCT 39.3 07/15/2020   MCV 89.7 07/15/2020   PLT 119 (L) 07/15/2020   NEUTROABS 4.3 07/15/2020    CT Head Wo Contrast  Result Date: 06/26/2020 CLINICAL DATA:  Hypotension and lethargy EXAM: CT HEAD WITHOUT CONTRAST TECHNIQUE: Contiguous axial images were obtained from the base of the skull through the vertex without intravenous contrast. COMPARISON:  None. FINDINGS: Brain: Diffuse atrophic changes are noted. Findings of chronic white  matter ischemic change are seen. No findings to suggest acute hemorrhage, acute infarction or space-occupying mass lesion are noted. Vascular: No hyperdense vessel or unexpected calcification. Skull: Normal. Negative for fracture or focal lesion. Sinuses/Orbits: No acute finding. Other: None. IMPRESSION: Chronic atrophic and ischemic changes without acute abnormality. Electronically Signed   By: Inez Catalina M.D.   On: 06/26/2020 01:03   CT Abdomen Pelvis W Contrast  Result Date: 06/26/2020 CLINICAL DATA:  Known metastatic cecal carcinoma with abdominal distension EXAM: CT ABDOMEN AND PELVIS WITH CONTRAST TECHNIQUE: Multidetector CT imaging of the abdomen and pelvis was performed using the standard protocol following bolus administration of intravenous contrast. CONTRAST:  132mL OMNIPAQUE IOHEXOL 300 MG/ML  SOLN COMPARISON:  04/05/2020 PET-CT FINDINGS: Lower chest: No acute abnormality. Hepatobiliary: Multiple enhancing lesions are noted throughout the liver consistent with metastatic disease. Largest of these measures 16 cm in dimension. Gallbladder is within normal limits. Changes of prior embolotherapy in the gastro duodenal artery are seen. Pancreas: Unremarkable. No pancreatic ductal dilatation or surrounding inflammatory changes. Spleen: Normal in size without focal abnormality. Adrenals/Urinary Tract: Adrenal glands are within normal limits. Kidneys demonstrate a normal enhancement pattern bilaterally. Normal excretion is noted from the kidneys bilaterally. The bladder is decompressed Stomach/Bowel: Changes of prior right colonic surgery are noted. Ileocolic  anastomosis is noted without obstructive change. Vascular/Lymphatic: Aortic atherosclerosis. No enlarged abdominal or pelvic lymph nodes. Reproductive: Prostate is mildly prominent indenting upon the bladder. This is stable from prior PET-CT. Other: No abdominal wall hernia or abnormality. No abdominopelvic ascites. Musculoskeletal: Increased sclerosis  is noted within the sacrum which corresponds to a metastatic lesion seen on prior PET-CT. Some scattered sclerotic lesions are noted within the spine consistent with metastatic disease. IMPRESSION: Changes consistent with diffuse hepatic metastatic disease similar to that seen on prior PET-CT. Bony metastatic disease is noted. No acute abnormality is noted correspond with the patient's given clinical history. Electronically Signed   By: Inez Catalina M.D.   On: 06/26/2020 01:58   DG Chest Port 1 View  Result Date: 06/25/2020 CLINICAL DATA:  Weakness EXAM: PORTABLE CHEST 1 VIEW COMPARISON:  09/23/2011 FINDINGS: Cardiac shadow is at the upper limits of normal in size. The lungs are well aerated bilaterally. No focal infiltrate is seen. No bony abnormality is noted. IMPRESSION: No acute abnormality noted. Electronically Signed   By: Inez Catalina M.D.   On: 06/25/2020 22:00   DG Abd Portable 1V  Result Date: 07/12/2020 CLINICAL DATA:  Abdominal pain and hematuria. History of prostate surgery. EXAM: PORTABLE ABDOMEN - 1 VIEW COMPARISON:  Pelvic CT 06/26/2020. FINDINGS: The bowel gas pattern is nonobstructive. There is no supine evidence of free intraperitoneal air. Embolization coils and bowel anastomosis clips are present within the mid abdomen. Stable small right pelvic calcifications consistent with phleboliths. No acute osseous findings. IMPRESSION: No acute abdominal findings. Electronically Signed   By: Richardean Sale M.D.   On: 07/12/2020 18:39    ASSESSMENT AND PLAN: 1. 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  1. Status post a right colon/ileum resection on 09/29/2011.  2. Wedge biopsy of a right liver lesion on 09/29/2011 confirmed metastatic well-differentiated neuroendocrine carcinoma.  3. CT of the abdomen 08/25/2011 consistent with multiple liver metastases.  4. Elevated preoperative chromogranin A level and  a 24-hour urine 5-HIAA level. Stable on repeat 11/16/2011. 5. Restaging CT on 12/28/2011 confirmed slight progression of the metastatic liver lesions. 6. Status post 3 cycles of Xeloda/temozolomide with cycle 3 initiated on 03/12/2012 7. Restaging CT of the abdomen 04/04/2012 revealed slight progression of the metastatic liver lesions and transverse mesocolon adenopathy. 8. Status post Y-90 radioembolization of the right liver on 05/19/2012. 9. Status post Y-90 radioembolization of the left liver on 07/06/2012. 10. Chromogranin A level decreased on 08/29/2012, 24-hour urine 5 HIAA slightly increased on 09/05/2012 11. Restaging CT on 12/05/2012 with a mixed response in the liver 12. Restaging CT 08/11/2013-mild increase in the size of liver lesions. 13. Restaging CT 01/22/2014-stable to slightly improved liver lesions, mild increase in mesenteric adenopathy. Chromogranin A level stable. 14. Chromogranin A level stable 05/01/2014. 15. Chromogranin A higher on 08/02/2014 16. Restaging CT 11/06/2014 with interval enlargement of the enhancing lesions within the left and right hepatic lobe. 17. Initiation of monthly Sandostatin 12/24/2014 18. Status post Y-90 radioembolization of the right liver on 01/03/2015 19. Chromogranin A level stable 02/18/2015 20. Chromogranin A level improved 08/12/2015 21. Restaging CT 09/11/2015 with mild increase in the size of the dominant liver metastasis in the right hepatic lobe. Other smaller liver metastases showed no significant change. Stable mild lymphadenopathy in central small bowel mesentery and right cardiophrenic angle. Stable subcm right middle lobe pulmonary nodule. 22. Monthly Sandostatin continued 23. Gallium-DOTATATE01/25/2018-increased activity of a dominant right liver mass  and left liver lesion. Increased tracer accumulation in a mesenteric mass 24. Cycle 1Lutathera 03/30/2018 25. Cycle 2 Lutathera 05/25/2018 26. Cycle 3 Lutathera 07/20/2018 27. Cycle  4 Lutathera 09/14/2018 28. Restaging Netspot 10/18/2018-current scan very similar to pretherapy dotatate PET scan. No evidence of new or progressive neuroendocrine tumor. No enlarged tumor sites. 62. Netspot 04/05/2020-new FDG avid liver lesions, new bone metastases, enlarged prostate 2. History of diarrhea (3 to 4 times per day), predating surgery for 8 months. Decrease in daily bowel movements coinciding with discontinuation of Nexium. 3. Hypertension. 4. History of gastroesophageal reflux disease. No reflux symptoms since discontinuing Nexium. 5. Rectal and ascending colon polyps. He underwent a colonoscopy on 12/20/2012 with findings of a sessile polyp in the ascending colon and a sessile polyp in the rectum. Pathology showed hyperplastic polyps. Prior right colon resection noted with a normal appearing ileocolonic anastomosis. Small internal hemorrhoids noted. Repeat colonoscopy in 5 years. 6. History of Iron deficiency anemia 7. 08/12/2016 syncopal episode status post evaluation in the emergency department 8. Left-sided hearing loss July 2020 9. Urinary retention March 2021-followed at the urology center, TUR5/24/2021   Tommy Conley appears unchanged.  He has persistent altered mental status.  He has recurrent diarrhea, potentially related to resuming lactulose therapy.  He is unable to return home in his current condition.  He is awaiting skilled nursing facility placement.  We will need to consider hospice care if his mental status does not improve  Recommendations: 1.  Awaiting SNF placement 2.  Continue Sandostatin, will increase the frequency of Sandostatin therapy if he has persistent diarrhea. 3.  Recommend GI consult for probable hepatic encephalopathy.   LOS: 5 days   Betsy Coder, DNP, AGPCNP-BC, AOCNP 07/17/20

## 2020-07-17 NOTE — Progress Notes (Signed)
Physical Therapy Treatment Patient Details Name: Tommy Conley MRN: [id number] DOB: 1951-03-07 Today's Date: 07/17/2020    History of Present Illness 69 y.o. 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  Admitted for AMS and FTT    PT Comments    Pt progressing with therapy today.  He was able to ambulate in min A.  Required skilled cues for transfer technique/safety and min A with gait for steadying and RW control.  Cont to recommend SNF at d/c.  Cont POC.    Follow Up Recommendations  SNF;Supervision/Assistance - 24 hour     Equipment Recommendations  None recommended by PT    Recommendations for Other Services       Precautions / Restrictions Precautions Precautions: Fall    Mobility  Bed Mobility Overal bed mobility: Needs Assistance Bed Mobility: Sit to Supine   Sidelying to sit: Min assist Supine to sit: Min assist     General bed mobility comments: min A for bil LE  Transfers Overall transfer level: Needs assistance Equipment used: Rolling walker (2 wheeled) Transfers: Sit to/from Stand Sit to Stand: Min guard Stand pivot transfers: Min guard       General transfer comment: Sit to stand x 2 with min guard, increased time, and cues for safe hand  placement  Ambulation/Gait Ambulation/Gait assistance: Min assist Gait Distance (Feet): 20 Feet Assistive device: Rolling walker (2 wheeled) Gait Pattern/deviations: Step-through pattern;Decreased stride length Gait velocity: decreased   General Gait Details: Cues and assist with RW; min A for steadying with turns; very slow gait  speed   Stairs             Wheelchair Mobility    Modified Rankin (Stroke Patients Only)       Balance Overall balance assessment: Needs assistance Sitting-balance support: Feet supported;No upper extremity supported Sitting balance-Leahy Scale: Good     Standing balance support: Bilateral upper extremity supported Standing balance-Leahy Scale: Poor Standing balance comment: reliant on UE support                            Cognition Arousal/Alertness: Lethargic Behavior During Therapy: Flat affect Overall Cognitive Status: Within Functional Limits for tasks assessed                                 General Comments: Pt followed simple commands with increased time and multimodal cues.  Pt is HOH and does better when speaking to R ear per sister.      Exercises      General Comments General comments (skin integrity, edema, etc.): VSS; pt worked with OT earlier and was sitting in chair for a couple hours - fatigued and ready to go back to bed      Pertinent Vitals/Pain Pain Assessment: No/denies pain Faces Pain Scale: Hurts little more Pain Location: gluteal cleft Pain Descriptors / Indicators: Burning;Grimacing Pain Intervention(s): Monitored during session    Home Living                      Prior Function  PT Goals (current goals can now be found in the care plan section) Acute Rehab PT Goals Patient Stated Goal: Did not state Progress towards PT goals: Progressing toward goals    Frequency    Min 2X/week      PT Plan Current plan remains appropriate    Co-evaluation              AM-PAC PT "6 Clicks" Mobility   Outcome Measure  Help needed turning from your back to your side while in a flat bed without using bedrails?: None Help needed moving from lying on your back to sitting on the side of a flat bed without using bedrails?: None Help needed moving to and from a bed to a chair (including  a wheelchair)?: A Little Help needed standing up from a chair using your arms (e.g., wheelchair or bedside chair)?: A Little Help needed to walk in hospital room?: A Little Help needed climbing 3-5 steps with a railing? : A Lot 6 Click Score: 19    End of Session Equipment Utilized During Treatment: Gait belt Activity Tolerance: Patient limited by lethargy Patient left: in bed;with call bell/phone within reach;with bed alarm set Nurse Communication: Mobility status PT Visit Diagnosis: Unsteadiness on feet (R26.81);Other abnormalities of gait and mobility (R26.89);Muscle weakness (generalized) (M62.81)     Time: 1209-1226 PT Time Calculation (min) (ACUTE ONLY): 17 min  Charges:  $Gait Training: 8-22 mins                     Abran Richard, PT Acute Rehab Services Pager [phone] [facility] [place] 07/17/2020, 12:33 PM

## 2020-07-17 NOTE — TOC Initial Note (Signed)
Transition of Care [facility]) - Initial/Assessment Note     Patient Details  Name: Tommy Conley MRN: [id number] Date of Birth: 1951-07-25  Transition of Care [facility]) CM/SW Contact:    Cecil Cobbs Phone Number: 07-16-20 10:43am  Clinical Narrative:                  Patient is a 69 year old male who is alert and oriented x2.  Patient lives with his daughter, and has been receiving home health services and also followed by palliative through [facility].  CSW was living at home, and patient and family decided that he needed SNF placement from home.  CSW was informed that [facility] had started insurance authorization, and patient was approved, however he was then admitted to the hospital.  CSW contacted patient's insurance company to restart insurance authorization, reference number [id number].  Patient's insurance company requested updated clinicals to be faxed to them so they can review patient's information.  CSW attempted to efax clinicals to insurance company.  CSW awaiting for insurance authorization for patient to go to SNF for short term rehab.  Expected Discharge Plan: Skilled Nursing Facility Barriers to Discharge: Insurance Authorization, Continued Medical Work up   Patient Goals and CMS Choice Patient states their goals for this hospitalization and ongoing recovery are:: To go to SNF then return back home. CMS Medicare.gov Compare Post Acute Care list provided to:: Patient Represenative (must comment) Choice offered to / list presented to : Adult Children  Expected Discharge Plan and Services Expected Discharge Plan: [place] In-house Referral: Clinical Social Work   Post Acute Care Choice: [place] Living arrangements for the past 2 months: [place] Expected Discharge Date: 07/16/20                 DME Agency: NA                  Prior Living Arrangements/Services Living arrangements for the past 2 months:  [place] Lives with:: Adult Children Patient language and need for interpreter reviewed:: Yes Do you feel safe going back to the place where you live?: No   Patient and family feel he needs some short term rehab, before he is able to return back home.  Need for Family Participation in Patient Care: Yes (Comment) Care giver support system in place?: Yes (comment) Current home services: Home PT, Home RN Criminal Activity/Legal Involvement Pertinent to Current Situation/Hospitalization: No - Comment as needed  Activities of Daily Living Home Assistive Devices/Equipment: Environmental consultant (specify type), Hearing aid, Bedside commode/3-in-1, Shower chair with back, Eyeglasses (front wheeled walker, bilateral hearing aids, tub/shower) ADL Screening (condition at time of admission) Patient's cognitive ability adequate to safely complete daily activities?: No Is the patient deaf or have difficulty hearing?: Yes (wears bilateral hearing aids) Does the patient have difficulty seeing, even when wearing glasses/contacts?: No Does the patient have difficulty concentrating, remembering, or making decisions?: Yes Patient able to express need for assistance with ADLs?: Yes Does the patient have difficulty dressing or bathing?: Yes Independently performs ADLs?: No Communication: Independent Dressing (OT): Dependent Is this a change from baseline?: Change from baseline, expected to last >3 days Grooming: Independent Feeding: Needs assistance Is this a change from baseline?: Change from baseline, expected to last >3 days Bathing: Dependent Is this a change from baseline?: Change from baseline, expected to last >3 days Toileting: Dependent Is this a change from baseline?: Change from baseline, expected to last >3days  In/Out Bed: Dependent Is this a change from baseline?: Change from baseline, expected to last >3 days Walks in Home: Dependent Is this a change from baseline?: Change from baseline, expected  to last >3 days Does the patient have difficulty walking or climbing stairs?: Yes (secondary to weakness) Weakness of Legs: Both Weakness of Arms/Hands: Both  Permission Sought/Granted Permission sought to share information with : Facility Sport and exercise psychologist, Family Supports Permission granted to share information with : Yes, Release of Information Signed  Share Information with NAME: Kaion, [place] Daughter [phone]  [phone]  Permission granted to share info w AGENCY: SNF admissions        Emotional Assessment Appearance:: Appears stated age   Affect (typically observed): Accepting, Appropriate, Calm Orientation: : Oriented to Self, Oriented to Place Alcohol / Substance Use: Not Applicable Psych Involvement: No (comment)  Admission diagnosis:  Hypotension [I95.9] Patient Active Problem List   Diagnosis Date Noted  . Diarrhea   . Palliative care by specialist   . Goals of care, counseling/discussion   . Altered mental state 07/12/2020  . Hypotension 07/12/2020  . Failure to thrive (0-17) 07/12/2020  . Hematuria 07/12/2020  . Acute hepatic encephalopathy 06/28/2020  . Acute metabolic encephalopathy due to GNR UTI 06/27/2020  . Acute lower GNR UTI 06/27/2020  . Generalized weakness 06/26/2020  . Dehydration 06/26/2020  . AKI (acute kidney injury) ([place]) 06/26/2020  . BPH without urinary obstruction 05/13/2020  . Malignant carcinoid tumors of other sites ([place]) 08/25/2016  . Rectal pain 05/05/2016  . Irritable bowel syndrome 04/29/2015  . Essential hypertension, benign 04/24/2013  . Erectile dysfunction 04/24/2013  . Esophageal reflux 04/24/2013  . 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 [place] [address] Phone: [phone] Fax:  [phone]     Social Determinants of Health (SDOH) Interventions    Readmission Risk Interventions No flowsheet data found.

## 2020-07-17 NOTE — Progress Notes (Signed)
Occupational Therapy Treatment Patient Details Name: Tommy Conley MRN: [id number] DOB: Aug 25, 1951 Today's Date: 07/17/2020    History of present illness 69 y.o. 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  Admitted for AMS and FTT   OT comments  Treatment focused on patient participating in self care tasks and functional mobility. Patient predominantly min guard for standing and transfers and able to assist with pericare and tioleting with set up and min guard with patient holding onto walker with one hand. Patient drowsy during treatment with poor attention span requiring therapist to provide verbal cues for patient to continue or stay on task. Patient performed multiple sit to stands during self care tasks and patient fatigued near the end of treatment requiring sitting break before returning to recliner.   Follow Up Recommendations  SNF    Equipment Recommendations  Other (comment) (defer to next venue)    Recommendations for Other Services      Precautions / Restrictions Precautions Precautions: Fall       Mobility Bed Mobility Overal bed mobility: Needs Assistance Bed Mobility: Supine to Sit   Sidelying to sit: Min assist          Transfers   Equipment used: Rolling walker (2 wheeled) Transfers: Sit to/from [facility] Sit to Stand: Min guard Stand pivot transfers: Min guard       General transfer comment: Min guard for standing from bed and to take steps to recliner.    Balance Overall balance assessment: Needs assistance Sitting-balance support: Feet supported;No  upper extremity supported Sitting balance-Leahy Scale: Good     Standing balance support: During functional activity;Single extremity supported Standing balance-Leahy Scale: Fair                             ADL either performed or assessed with clinical judgement   ADL Overall ADL's : Needs assistance/impaired     Grooming: Wash/dry hands;Wash/dry face;Set up;Sitting Grooming Details (indicate cue type and reason): Patient performed grooming task seated in recliner.     Lower Body Bathing: Set up;Min guard Lower Body Bathing Details (indicate cue type and reason): Patient able to wash buttocks and periarea in standing holding onto walker with setup and min guard from therapist.         Toilet Transfer: Min Art therapist Details (indicate cue type and reason): Min guard  to transfer to [facility]. Toileting- Water quality scientist and Hygiene: Min guard;Set up [place] Manipulation Details (indicate cue type and reason): Patient able to wipe bottom with min guard from therapist and set up with cleaning cloths. Therapist applied barrier cream due to patient's complaints of pain.             Vision       Perception     Praxis      Cognition Arousal/Alertness: Lethargic Behavior During Therapy: Flat affect Overall Cognitive Status: No family/caregiver present to determine baseline cognitive functioning                                 General Comments: Patient's attention poor -  requiring verbal cues to stay on task.        Exercises     Shoulder Instructions       General Comments      Pertinent Vitals/ Pain       Pain Assessment: Faces Faces Pain Scale: Hurts little more Pain Location: gluteal cleft Pain Descriptors / Indicators: Burning;Grimacing Pain Intervention(s): Monitored during session  Home Living                                          Prior Functioning/Environment               Frequency  Min 2X/week        Progress Toward Goals  OT Goals(current goals can now be found in the care plan section)  Progress towards OT goals: Progressing toward goals  Acute Rehab OT Goals Patient Stated Goal: Did not state OT Goal Formulation: Patient unable to participate in goal setting Time For Goal Achievement: 07/27/20 Potential to Achieve Goals: Good  Plan Discharge plan remains appropriate    Co-evaluation                 AM-PAC OT "6 Clicks" Daily Activity     Outcome Measure   Help from another person eating meals?: A Little Help from another person taking care of personal grooming?: A Little Help from another person toileting, which includes using toliet, bedpan, or urinal?: A Lot Help from another person bathing (including washing, rinsing, drying)?: A Lot Help from another person to put on and taking off regular upper body clothing?: A Little Help from another person to put on and taking off regular lower body clothing?: A Lot 6 Click Score: 15    End of Session Equipment Utilized During Treatment: Rolling walker  OT Visit Diagnosis: Unsteadiness on feet (R26.81);Muscle weakness (generalized) (M62.81)   Activity Tolerance Patient tolerated treatment well   Patient Left in chair;with call bell/phone within reach;with chair alarm set   Nurse Communication Mobility status (Arm edematous above IV site)        Time: 4010-2725 OT Time Calculation (min): 31 min  Charges: OT General Charges $OT Visit: 1 Visit OT Treatments $Self Care/Home Management : 23-37 mins  Jannie Doyle, OTR/L Acute Care Rehab Services  Office [phone] Pager: [place] 07/17/2020, 11:06 AM

## 2020-07-18 ENCOUNTER — Other Ambulatory Visit: Payer: Self-pay | Admitting: *Deleted

## 2020-07-18 ENCOUNTER — Telehealth: Payer: Self-pay | Admitting: Oncology

## 2020-07-18 DIAGNOSIS — R7989 Other specified abnormal findings of blood chemistry: Secondary | ICD-10-CM | POA: Diagnosis not present

## 2020-07-18 DIAGNOSIS — R932 Abnormal findings on diagnostic imaging of liver and biliary tract: Secondary | ICD-10-CM | POA: Diagnosis not present

## 2020-07-18 DIAGNOSIS — C7A012 Malignant carcinoid tumor of the ileum: Secondary | ICD-10-CM | POA: Diagnosis not present

## 2020-07-18 DIAGNOSIS — R197 Diarrhea, unspecified: Secondary | ICD-10-CM | POA: Diagnosis not present

## 2020-07-18 DIAGNOSIS — R4182 Altered mental status, unspecified: Secondary | ICD-10-CM | POA: Diagnosis not present

## 2020-07-18 DIAGNOSIS — E860 Dehydration: Secondary | ICD-10-CM | POA: Diagnosis not present

## 2020-07-18 DIAGNOSIS — N400 Enlarged prostate without lower urinary tract symptoms: Secondary | ICD-10-CM | POA: Diagnosis not present

## 2020-07-18 DIAGNOSIS — C7B02 Secondary carcinoid tumors of liver: Secondary | ICD-10-CM | POA: Diagnosis not present

## 2020-07-18 LAB — COMPREHENSIVE METABOLIC PANEL WITH GFR
ALT: 49 U/L — ABNORMAL HIGH (ref 0–44)
AST: 42 U/L — ABNORMAL HIGH (ref 15–41)
Albumin: 2.2 g/dL — ABNORMAL LOW (ref 3.5–5.0)
Alkaline Phosphatase: 224 U/L — ABNORMAL HIGH (ref 38–126)
Anion gap: 5 (ref 5–15)
BUN: 18 mg/dL (ref 8–23)
CO2: 19 mmol/L — ABNORMAL LOW (ref 22–32)
Calcium: 8.5 mg/dL — ABNORMAL LOW (ref 8.9–10.3)
Chloride: 112 mmol/L — ABNORMAL HIGH (ref 98–111)
Creatinine, Ser: 0.77 mg/dL (ref 0.61–1.24)
GFR calc Af Amer: >60 mL/min
GFR calc non Af Amer: >60 mL/min
Glucose, Bld: 143 mg/dL — ABNORMAL HIGH (ref 70–99)
Potassium: 4.4 mmol/L (ref 3.5–5.1)
Sodium: 136 mmol/L (ref 135–145)
Total Bilirubin: 0.8 mg/dL (ref 0.3–1.2)
Total Protein: 4.9 g/dL — ABNORMAL LOW (ref 6.5–8.1)

## 2020-07-18 LAB — CBC WITH DIFFERENTIAL/PLATELET
Abs Immature Granulocytes: 0.02 10*3/uL (ref 0.00–0.07)
Basophils Absolute: 0 10*3/uL (ref 0.0–0.1)
Basophils Relative: 0 %
Eosinophils Absolute: 0.1 10*3/uL (ref 0.0–0.5)
Eosinophils Relative: 1 %
HCT: 36.2 % — ABNORMAL LOW (ref 39.0–52.0)
Hemoglobin: 11.5 g/dL — ABNORMAL LOW (ref 13.0–17.0)
Immature Granulocytes: 0 %
Lymphocytes Relative: 23 %
Lymphs Abs: 1.2 10*3/uL (ref 0.7–4.0)
MCH: 29 pg (ref 26.0–34.0)
MCHC: 31.8 g/dL (ref 30.0–36.0)
MCV: 91.4 fL (ref 80.0–100.0)
Monocytes Absolute: 0.6 10*3/uL (ref 0.1–1.0)
Monocytes Relative: 11 %
Neutro Abs: 3.5 10*3/uL (ref 1.7–7.7)
Neutrophils Relative %: 65 %
Platelets: 123 10*3/uL — ABNORMAL LOW (ref 150–400)
RBC: 3.96 MIL/uL — ABNORMAL LOW (ref 4.22–5.81)
RDW: 16.8 % — ABNORMAL HIGH (ref 11.5–15.5)
WBC: 5.4 10*3/uL (ref 4.0–10.5)
nRBC: 0 % (ref 0.0–0.2)

## 2020-07-18 LAB — SARS CORONAVIRUS 2 BY RT PCR (HOSPITAL ORDER, PERFORMED IN ~~LOC~~ HOSPITAL LAB): SARS Coronavirus 2: NEGATIVE

## 2020-07-18 LAB — PHOSPHORUS: Phosphorus: 2.3 mg/dL — ABNORMAL LOW (ref 2.5–4.6)

## 2020-07-18 LAB — MAGNESIUM: Magnesium: 1.9 mg/dL (ref 1.7–2.4)

## 2020-07-18 LAB — AMMONIA: Ammonia: 43 umol/L — ABNORMAL HIGH (ref 9–35)

## 2020-07-18 MED ORDER — LACTULOSE ENCEPHALOPATHY 10 GM/15ML PO SOLN: 10.0000 g | Freq: Every day | ORAL | 0 refills | Status: AC

## 2020-07-18 MED ORDER — ONDANSETRON HCL 4 MG PO TABS: 4.0000 mg | ORAL_TABLET | Freq: Four times a day (QID) | ORAL | 0 refills | Status: AC | PRN

## 2020-07-18 MED ORDER — ENSURE ENLIVE PO LIQD: 237.0000 mL | Freq: Two times a day (BID) | ORAL | 12 refills | Status: AC

## 2020-07-18 MED ORDER — OCTREOTIDE ACETATE 50 MCG/ML IJ SOLN: 100.0000 ug | Freq: Two times a day (BID) | INTRAMUSCULAR | Status: AC

## 2020-07-18 MED ORDER — RIFAXIMIN 550 MG PO TABS: 550.0000 mg | ORAL_TABLET | Freq: Two times a day (BID) | ORAL | 0 refills | Status: AC

## 2020-07-18 MED ORDER — ADULT MULTIVITAMIN W/MINERALS CH: 1.0000 | ORAL_TABLET | Freq: Every day | ORAL | 0 refills | Status: AC

## 2020-07-18 MED ORDER — FOLIC ACID 1 MG PO TABS: 1.0000 mg | ORAL_TABLET | Freq: Every day | ORAL | Status: AC

## 2020-07-18 NOTE — Telephone Encounter (Signed)
Scheduled appt per 7/29 sch msg - left message for daughter with appt date and time

## 2020-07-18 NOTE — Consult Note (Addendum)
Referring Provider:  Dr. Alfredia Ferguson, [facility] Primary Care Physician:  Erven Colla, DO Primary Gastroenterologist:  Dr. Fuller Plan  Reason for Consultation:  HE/confusion  HPI: Tommy Conley is a 69 y.o. 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  Admitted with FTT, malnutrition, dehydration, AKI, metabolic derangements, and AMS.  There is concern for HE related to tumor burden in the liver.  Ammonia elevated at 61>>52>>40.  On lactulose 10 grams BID and xifaxan 550 mg BID.  He's had some diarrhea reportedly although he could not tell me how much (looks like 2 BMs recorded so far for this AM).  There is no family at bedside and it is difficult to obtain information/history from him as he is confused and sleepy.   Past Medical History:  Diagnosis Date  . Allergy   . Barrett's esophagus   . BPH (benign prostatic hyperplasia)   . Carcinoid tumor of cecum   . Colon polyp   . Complication of anesthesia 2010   woke up during radiation pellet insertion  . Foley catheter in place last 3- 4 weeks  . GERD (gastroesophageal reflux disease)   . Heart murmur    "had all my left" no cardiologist currently had checked 3-4 times in past  . Hypertension   . IBS (irritable bowel syndrome)   . Malignant carcinoid tumors of other sites [facility]) 08/25/2016   colon  . Perennial allergic rhinitis     Past Surgical History:  Procedure Laterality Date  . colectomy  09/29/11   lap - right with liver bx  . COLON SURGERY  2017  . COLONOSCOPY  07/2011  . radioembolization right liver  2016  . TRANSURETHRAL RESECTION OF PROSTATE N/A 05/13/2020   Procedure: TRANSURETHRAL RESECTION OF THE PROSTATE (TURP);  Surgeon: Kathie Rhodes, MD;  Location: [facility];  Service: Urology;   Laterality: N/A;  . UPPER GASTROINTESTINAL ENDOSCOPY     hx barrett's esophagus  . WEDGE LIVER BIOPSY  09/29/11   right liver lesion    Prior to Admission medications   Medication Sig Start Date End Date Taking? Authorizing Provider  atenolol (TENORMIN) 25 MG tablet Take 1 tablet (25 mg total) by mouth daily. 06/28/20 06/28/21 Yes Emokpae, Courage, MD  lactulose, encephalopathy, (CHRONULAC) 10 GM/15ML SOLN Take 15 mLs (10 g total) by mouth daily. May take additional dose each day if no bowel movement 06/28/20  Yes Emokpae, Courage, MD  potassium chloride SA (KLOR-CON) 20 MEQ tablet TAKE 1 TABLET(20 MEQ) BY MOUTH TWICE DAILY Patient taking differently: Take 20 mEq by mouth 2 (two) times daily.  11/27/19  Yes Ladell Pier, MD  folic acid (FOLVITE) 1 MG tablet Take 1 tablet (1 mg total) by mouth daily. 07/16/20   Patrecia Pour, MD  tamsulosin (FLOMAX) 0.4 MG CAPS capsule Take 1 capsule (0.4 mg total) by mouth at bedtime. 06/28/20   Roxan Hockey, MD    Current Facility-Administered Medications  Medication Dose Route Frequency Provider Last Rate Last Admin  . dextrose 5% in lactated ringers with KCl 20 mEq/L infusion   Intravenous Continuous Patrecia Pour, MD 100 mL/hr at 07/18/20 0002 New Bag at 07/18/20 0002  . feeding supplement (BOOST / RESOURCE BREEZE) liquid 1 Container  1 Container Oral Q breakfast Patrecia Pour, MD  1 Container at 07/18/20 0827  . feeding supplement (ENSURE ENLIVE) (ENSURE ENLIVE) liquid 237 mL  237 mL Oral BID BM Vance Gather B, MD   237 mL at 71/24/58 0998  . folic acid (FOLVITE) tablet 1 mg  1 mg Oral Daily Vance Gather B, MD   1 mg at 07/17/20 1031  . heparin injection 5,000 Units  5,000 Units Subcutaneous Q8H Oretha Milch D, MD   5,000 Units at 07/18/20 [phone]  . lactulose (CHRONULAC) 10 GM/15ML solution 10 g  10 g Oral BID Patrecia Pour, MD   10 g at 07/17/20 2151  . multivitamin with minerals tablet 1 tablet  1 tablet Oral Daily Patrecia Pour, MD   1 tablet at  07/17/20 1031  . octreotide (SANDOSTATIN) injection 100 mcg  100 mcg Subcutaneous BID Ladell Pier, MD   100 mcg at 07/18/20 5053  . ondansetron (ZOFRAN) tablet 4 mg  4 mg Oral Q6H PRN Oretha Milch D, MD       Or  . ondansetron (ZOFRAN) injection 4 mg  4 mg Intravenous Q6H PRN Oretha Milch D, MD      . polyethylene glycol (MIRALAX / GLYCOLAX) packet 17 g  17 g Oral Daily PRN Oretha Milch D, MD      . potassium & sodium phosphates (PHOS-NAK) 280-160-250 MG packet 1 packet  1 packet Oral TID WC & HS Patrecia Pour, MD   1 packet at 07/18/20 [phone]  . rifaximin (XIFAXAN) tablet 550 mg  550 mg Oral BID Patrecia Pour, MD   550 mg at 07/17/20 2153  . tamsulosin (FLOMAX) capsule 0.4 mg  0.4 mg Oral QHS Patrecia Pour, MD   0.4 mg at 07/17/20 2153  . traMADol (ULTRAM) tablet 50 mg  50 mg Oral Q8H PRN Desiree Hane, MD        Allergies as of 07/11/2020 - Review Complete 07/11/2020  Allergen Reaction Noted  . Vasotec [enalapril] Cough 04/22/2013    Family History  Problem Relation Age of Onset  . Hypertension Father   . Hyperlipidemia Father   . Heart attack Father   . Colon polyps Neg Hx   . Colon cancer Neg Hx   . Stomach cancer Neg Hx   . Cancer Neg Hx   . Esophageal cancer Neg Hx   . Pancreatic cancer Neg Hx   . Rectal cancer Neg Hx     Social History   Socioeconomic History  . Marital status: Legally Separated    Spouse name: Not on file  . Number of children: Not on file  . Years of education: Not on file  . Highest education level: Not on file  Occupational History  . Not on file  Tobacco Use  . Smoking status: Light Tobacco Smoker    Packs/day: 0.25    Years: 17.00    Pack years: 4.25    Types: Cigarettes  . Smokeless tobacco: Never Used  . Tobacco comment: does not smoke every day  Vaping Use  . Vaping Use: Never used  Substance and Sexual Activity  . Alcohol use: No  . Drug use: No  . Sexual activity: Not on file  Other Topics Concern  . Not on file    Social History Narrative  . Not on file   Social Determinants of Health   Financial Resource Strain:   . Difficulty of Paying Living Expenses:   Food Insecurity:   . Worried About Charity fundraiser in  the Last Year:   . [place] in the Last Year:   Transportation Needs:   . Film/video editor (Medical):   [name] Lack of Transportation (Non-Medical):   Physical Activity:   . Days of Exercise per Week:   . Minutes of Exercise per Session:   Stress:   . Feeling of Stress :   Social Connections:   . Frequency of Communication with Friends and Family:   . Frequency of Social Gatherings with Friends and Family:   . Attends Religious Services:   . Active Member of Clubs or Organizations:   . Attends Archivist Meetings:   [name] Marital Status:   Intimate Partner Violence:   . Fear of Current or Ex-Partner:   . Emotionally Abused:   [name] Physically Abused:   . Sexually Abused:     Review of Systems: Difficult to obtain due to patient confusion and drifting in and out of sleep.  Physical Exam: Vital signs in last 24 hours: Temp:  [97.6 F (36.4 C)-98 F (36.7 C)] 97.7 F (36.5 C) (07/29 0416) Pulse Rate:  [80-90] 80 (07/29 0416) Resp:  [16-20] 20 (07/29 0416) BP: (112-121)/(66-81) 112/66 (07/29 0416) SpO2:  [100 %] 100 % (07/29 0416) Weight:  [80.5 kg] 80.5 kg (07/29 0500) Last BM Date: 07/17/20 General: Alert, but sleep, drifting in and out of sleep.  Well-developed, well-nourished, pleasant and cooperative in NAD. Head:  Normocephalic and atraumatic. Eyes:  Sclera clear, no icterus.  Conjunctiva pink. Ears:  Normal auditory acuity. Mouth:  No deformity or lesions.   Lungs:  Clear throughout to auscultation.  No wheezes, crackles, or rhonchi.  Heart:  Regular rate and rhythm; no murmurs, clicks, rubs, or gallops. Abdomen:  Soft, non-distended.  BS present.  Non-tender.   Msk:  Symmetrical without gross deformities. Pulses:  Normal pulses  noted. Extremities:  Without clubbing or edema. Neurologic:  Alert but sleepy.  Drifting in and out of sleep.  Oriented to self.  Thought that he was at [facility] and that it was 2003, but know that it was July and that Barbette Or is president. Skin:  Intact without significant lesions or rashes.  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  Admitted with FTT, malnutrition, dehydration, AKI, metabolic derangements, and AMS. *Concern for HE related to tumor burden in the liver:  Ammonia elevated at 61>>52>>40.  On lactulose and xifaxan.    PLAN: -Lactulose and xifaxan are the only options for HE.  He is on lactulose 10 gm BID and xifaxan 550 mg BID already.  He has been having issues with diarrhea reportedly.  If that is the case then dose of lactulose may need to be decreased/titrated so that he is having about 3 soft BMs daily.  Otherwise Xifaxan alone is the only other option.   Laban Emperor. Zehr  07/18/2020, 10:08 AM  GI ATTENDING  History, laboratories, x-rays reviewed.  Patient seen and examined.  Agree with comprehensive consultation note as outlined above.  See patient regarding altered mental status (query hepatic encephalopathy) and diarrhea. Impression/plan: 1.  Altered mental status.  Multifactorial.  Do NOT believe that this is due to hepatic encephalopathy.  Though he has significant tumor burden in his liver, other markers of advanced  liver disease or liver failure are absent.  His INR and bilirubin are normal, specifically.  Low level abnormalities of venous ammonia are neither sensitive or specific for hepatic encephalopathy.  He has been on lactulose, which may be contributing to his diarrhea.  Not certain that he needs this therapy. 2.  Diarrhea.  Related to neuroendocrine tumor.  On Sandostatin.  As well, lactulose as above.  I would stop lactulose  No additional treatment plans or work-up recommendations from a GI perspective.  He will return to the care of his primary providers.  Thank you.  We will sign off.  Docia Chuck. Geri Seminole., M.D. [facility] Division of Gastroenterology

## 2020-07-18 NOTE — Progress Notes (Signed)
Reported called to [place], all questions and concerns addressed.  Patient to go by transport with all belongings. AVS sent in d/c packet.

## 2020-07-18 NOTE — Progress Notes (Signed)
Per Dr. Benay Spice: Needs Sandostatin LAR injection next week and office visit week of 8/16 with BS or NP. High priority scheduling message sent.

## 2020-07-18 NOTE — TOC Transition Note (Addendum)
Transition of Care [facility]) - CM/SW Discharge Note   Patient Details  Name: Tommy Conley MRN: [id number] Date of Birth: Aug 17, 1951  Transition of Care [facility]) CM/SW Contact:  Ross Ludwig, LCSW Phone Number: 07/18/2020, 5:15 PM   Clinical Narrative:     CSW received insurance authorization for patient to go to [facility] in [place], authorization number [id number].  CSW contacted [facility], and they are able to accept patient today.  Patient is currently being followed by [facility] for palliative services, CSW notified Audrea Muscat at [facility] that patient is discharging to SNF today.  Patient to be d/c'ed today to [facility] in [place] room A4 bed 2.  Patient and family agreeable to plans will transport via ems RN to call report to [phone].  CSW spoke to patient's daughter who is aware that patient is discharging today.      Final next level of care: Skilled Nursing Facility Barriers to Discharge: Barriers Resolved   Patient Goals and CMS Choice Patient states their goals for this hospitalization and ongoing recovery are:: To go to SNF for short term rehab, then return back home. CMS Medicare.gov Compare Post Acute Care list provided to:: Patient Represenative (must comment) Choice offered to / list presented to : Adult Children  Discharge Placement PASRR number recieved: 07/16/20            Patient chooses bed at: Other - please specify in the comment section below: [facility]) Patient to be transferred to facility by: [facility] Name of family member notified: Daughter [name] Patient and family notified of of transfer: 07/18/20  Discharge Plan and Services In-house Referral: Clinical Social Work   Post Acute Care Choice: [place]            DME Agency: NA       HH Arranged: NA          Social Determinants of Health (SDOH) Interventions     Readmission Risk Interventions No flowsheet data  found.

## 2020-07-18 NOTE — Progress Notes (Addendum)
HEMATOLOGY-ONCOLOGY PROGRESS NOTE  SUBJECTIVE: Reports ongoing diarrhea.  He offers no other complaints this morning.  Discussed with hospitalist who plans for discharge to SNF later today.  PHYSICAL EXAMINATION:  Vitals:   07/17/20 2111 07/18/20 0416  BP: 121/81 112/66  Pulse: 86 80  Resp: 20 20  Temp: 98 F (36.7 C) 97.7 F (36.5 C)  SpO2: 100% 100%   Filed Weights   07/15/20 0500 07/17/20 0427 07/18/20 0500  Weight: 76.5 kg 78.1 kg 80.5 kg    Intake/Output from previous day: 07/28 0701 - 07/29 0700 In: 1154.5 [P.O.:120; I.V.:1034.5] Out: -    GENERAL: Lethargic, arousable OROPHARYNX: No thrush or mucositis  ABDOMEN:abdomen soft, non-tender and normal bowel sounds  NEURO: Alert, oriented to person, knows he is in the hospital, does not know what year it is  LABORATORY DATA:  I have reviewed the data as listed CMP Latest Ref Rng & Units 07/17/2020 07/15/2020 07/14/2020  Glucose 70 - 99 mg/dL 109(H) 92 102(H)  BUN 8 - 23 mg/dL 19 24(H) 25(H)  Creatinine 0.61 - 1.24 mg/dL 0.86 0.87 0.93  Sodium 135 - 145 mmol/L 136 133(L) 134(L)  Potassium 3.5 - 5.1 mmol/L 4.5 3.6 4.2  Chloride 98 - 111 mmol/L 111 109 109  CO2 22 - 32 mmol/L 18(L) 17(L) 16(L)  Calcium 8.9 - 10.3 mg/dL 8.5(L) 8.6(L) 8.5(L)  Total Protein 6.5 - 8.1 g/dL - - -  Total Bilirubin 0.3 - 1.2 mg/dL - - -  Alkaline Phos 38 - 126 U/L - - -  AST 15 - 41 U/L - - -  ALT 0 - 44 U/L - - -    Lab Results  Component Value Date   WBC 6.8 07/15/2020   HGB 12.9 (L) 07/15/2020   HCT 39.3 07/15/2020   MCV 89.7 07/15/2020   PLT 119 (L) 07/15/2020   NEUTROABS 4.3 07/15/2020    CT Head Wo Contrast  Result Date: 06/26/2020 CLINICAL DATA:  Hypotension and lethargy EXAM: CT HEAD WITHOUT CONTRAST TECHNIQUE: Contiguous axial images were obtained from the base of the skull through the vertex without intravenous contrast. COMPARISON:  None. FINDINGS: Brain: Diffuse atrophic changes are noted. Findings of chronic white matter  ischemic change are seen. No findings to suggest acute hemorrhage, acute infarction or space-occupying mass lesion are noted. Vascular: No hyperdense vessel or unexpected calcification. Skull: Normal. Negative for fracture or focal lesion. Sinuses/Orbits: No acute finding. Other: None. IMPRESSION: Chronic atrophic and ischemic changes without acute abnormality. Electronically Signed   By: Inez Catalina M.D.   On: 06/26/2020 01:03   CT Abdomen Pelvis W Contrast  Result Date: 06/26/2020 CLINICAL DATA:  Known metastatic cecal carcinoma with abdominal distension EXAM: CT ABDOMEN AND PELVIS WITH CONTRAST TECHNIQUE: Multidetector CT imaging of the abdomen and pelvis was performed using the standard protocol following bolus administration of intravenous contrast. CONTRAST:  126mL OMNIPAQUE IOHEXOL 300 MG/ML  SOLN COMPARISON:  04/05/2020 PET-CT FINDINGS: Lower chest: No acute abnormality. Hepatobiliary: Multiple enhancing lesions are noted throughout the liver consistent with metastatic disease. Largest of these measures 16 cm in dimension. Gallbladder is within normal limits. Changes of prior embolotherapy in the gastro duodenal artery are seen. Pancreas: Unremarkable. No pancreatic ductal dilatation or surrounding inflammatory changes. Spleen: Normal in size without focal abnormality. Adrenals/Urinary Tract: Adrenal glands are within normal limits. Kidneys demonstrate a normal enhancement pattern bilaterally. Normal excretion is noted from the kidneys bilaterally. The bladder is decompressed Stomach/Bowel: Changes of prior right colonic surgery are noted. Ileocolic anastomosis  is noted without obstructive change. Vascular/Lymphatic: Aortic atherosclerosis. No enlarged abdominal or pelvic lymph nodes. Reproductive: Prostate is mildly prominent indenting upon the bladder. This is stable from prior PET-CT. Other: No abdominal wall hernia or abnormality. No abdominopelvic ascites. Musculoskeletal: Increased sclerosis is  noted within the sacrum which corresponds to a metastatic lesion seen on prior PET-CT. Some scattered sclerotic lesions are noted within the spine consistent with metastatic disease. IMPRESSION: Changes consistent with diffuse hepatic metastatic disease similar to that seen on prior PET-CT. Bony metastatic disease is noted. No acute abnormality is noted correspond with the patient's given clinical history. Electronically Signed   By: Inez Catalina M.D.   On: 06/26/2020 01:58   DG Chest Port 1 View  Result Date: 06/25/2020 CLINICAL DATA:  Weakness EXAM: PORTABLE CHEST 1 VIEW COMPARISON:  09/23/2011 FINDINGS: Cardiac shadow is at the upper limits of normal in size. The lungs are well aerated bilaterally. No focal infiltrate is seen. No bony abnormality is noted. IMPRESSION: No acute abnormality noted. Electronically Signed   By: Inez Catalina M.D.   On: 06/25/2020 22:00   DG Abd Portable 1V  Result Date: 07/12/2020 CLINICAL DATA:  Abdominal pain and hematuria. History of prostate surgery. EXAM: PORTABLE ABDOMEN - 1 VIEW COMPARISON:  Pelvic CT 06/26/2020. FINDINGS: The bowel gas pattern is nonobstructive. There is no supine evidence of free intraperitoneal air. Embolization coils and bowel anastomosis clips are present within the mid abdomen. Stable small right pelvic calcifications consistent with phleboliths. No acute osseous findings. IMPRESSION: No acute abdominal findings. Electronically Signed   By: Richardean Sale M.D.   On: 07/12/2020 18:39    ASSESSMENT AND PLAN: 1. 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  1. Status post a right colon/ileum resection on 09/29/2011.  2. Wedge biopsy of a right liver lesion on 09/29/2011 confirmed metastatic well-differentiated neuroendocrine carcinoma.  3. CT of the abdomen 08/25/2011 consistent with multiple liver metastases.  4. Elevated preoperative chromogranin A level and a  24-hour urine 5-HIAA level. Stable on repeat 11/16/2011. 5. Restaging CT on 12/28/2011 confirmed slight progression of the metastatic liver lesions. 6. Status post 3 cycles of Xeloda/temozolomide with cycle 3 initiated on 03/12/2012 7. Restaging CT of the abdomen 04/04/2012 revealed slight progression of the metastatic liver lesions and transverse mesocolon adenopathy. 8. Status post Y-90 radioembolization of the right liver on 05/19/2012. 9. Status post Y-90 radioembolization of the left liver on 07/06/2012. 10. Chromogranin A level decreased on 08/29/2012, 24-hour urine 5 HIAA slightly increased on 09/05/2012 11. Restaging CT on 12/05/2012 with a mixed response in the liver 12. Restaging CT 08/11/2013-mild increase in the size of liver lesions. 13. Restaging CT 01/22/2014-stable to slightly improved liver lesions, mild increase in mesenteric adenopathy. Chromogranin A level stable. 14. Chromogranin A level stable 05/01/2014. 15. Chromogranin A higher on 08/02/2014 16. Restaging CT 11/06/2014 with interval enlargement of the enhancing lesions within the left and right hepatic lobe. 17. Initiation of monthly Sandostatin 12/24/2014 18. Status post Y-90 radioembolization of the right liver on 01/03/2015 19. Chromogranin A level stable 02/18/2015 20. Chromogranin A level improved 08/12/2015 21. Restaging CT 09/11/2015 with mild increase in the size of the dominant liver metastasis in the right hepatic lobe. Other smaller liver metastases showed no significant change. Stable mild lymphadenopathy in central small bowel mesentery and right cardiophrenic angle. Stable subcm right middle lobe pulmonary nodule. 22. Monthly Sandostatin continued 23. Gallium-DOTATATE01/25/2018-increased activity of a dominant right liver mass and  left liver lesion. Increased tracer accumulation in a mesenteric mass 24. Cycle 1Lutathera 03/30/2018 25. Cycle 2 Lutathera 05/25/2018 26. Cycle 3 Lutathera 07/20/2018 27. Cycle 4  Lutathera 09/14/2018 28. Restaging Netspot 10/18/2018-current scan very similar to pretherapy dotatate PET scan. No evidence of new or progressive neuroendocrine tumor. No enlarged tumor sites. 19. Netspot 04/05/2020-new FDG avid liver lesions, new bone metastases, enlarged prostate 2. History of diarrhea (3 to 4 times per day), predating surgery for 8 months. Decrease in daily bowel movements coinciding with discontinuation of Nexium. 3. Hypertension. 4. History of gastroesophageal reflux disease. No reflux symptoms since discontinuing Nexium. 5. Rectal and ascending colon polyps. He underwent a colonoscopy on 12/20/2012 with findings of a sessile polyp in the ascending colon and a sessile polyp in the rectum. Pathology showed hyperplastic polyps. Prior right colon resection noted with a normal appearing ileocolonic anastomosis. Small internal hemorrhoids noted. Repeat colonoscopy in 5 years. 6. History of Iron deficiency anemia 7. 08/12/2016 syncopal episode status post evaluation in the emergency department 8. Left-sided hearing loss July 2020 9. Urinary retention March 2021-followed at the urology center, TUR5/24/2021 10.Admission 07/12/2020 with dehydration and altered mental status   Mr. Bushong appears unchanged.  He has persistent altered mental status.  He has recurrent diarrhea, potentially related to resuming lactulose therapy.  Hospitalist planning for discharge to SNF later today. GI aware of request for consult and will make recommendations today.  Recommendations: 1.  For SNF placement later today 2.  Consider discontinuation of lactulose due to diarrhea.  Will await recommendations from GI. 3.  Continue Sandostatin.  We will arrange for outpatient follow-up at the cancer center.   LOS: 6 days   Mikey Bussing, DNP, AGPCNP-BC, AOCNP 07/18/20 He remains confused and is having diarrhea.  The diarrhea may be secondary to lactulose or carcinoid syndrome.  It does not appear  lactulose has helped with the encephalopathy.  We will arrange for outpatient Sandostatin LAR and office follow-up at the cancer center.  It is possible the hepatic tumor burden has led to hepatic encephalopathy or there could be another explanation for the altered mental status.

## 2020-07-18 NOTE — Discharge Summary (Signed)
Physician Discharge Summary  SYRUS NAKAMA ZRA:[id number] DOB: [birth year] DOA: 07/12/2020  PCP: Erven Colla, DO  Admit date: 07/12/2020 Discharge date: 07/18/2020  Admitted From: Home Disposition: SNF  Recommendations for Outpatient Follow-up:  1. Follow up with PCP in 1-2 weeks 2. Follow up with Medical Oncology within 1-2 weeks 3. Follow up with Gastroenterology as needed  4. Continue Palliative Care at SNF 5. Please obtain CMP/CBC, Mag, Phos in one week 6. 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  Since his TURP in May 13, 2020 he has had hematuria as well as poor appetite, weight loss, weakness as well as more recently increasing loose stools.  He was admitted at [facility] from 7/7-7/9 with Klebsiella UTI as well as hepatic encephalopathy treated with lactulose.  He continues to have waxing and waning confusion associated with lethargy and had discontinued lactulose limited by his baseline diarrhea.    Initially was found to be hypotensive at  [place] visits and presented to the ED 07/05/2020 after a fall when not using his walker.  At that time his atenolol stopped.  Currently he had been admitted again for with weakness and the safest venue at discharge for him will be a skilled nursing facility and currently awaiting repeat COVID testing.  Medical oncology has been consulted and they  recommend a gastroenterology consult given his hepatic encephalopathy given his waxing and waning mental status.  His dehydration is improved as well as his hypokalemia, hyponatremia, metabolic acidosis and AKI with fluids.  GI evaluated the patient for his baseline hepatic encephalopathy recommends continuing Xifaxan and titrating the lactulose.  We will discharge the patient on 10 g of lactulose daily as well as Xifaxan 550 mg p.o. twice daily.  Currently is medically stable to be discharged to skilled nursing facility and will need follow-up with PCP, medical oncology, palliative care as well as gastroenterology in outpatient setting.  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 Multifactorial. No evidence of focal infection. -Feel safest venue at discharge with most likely chance at rehabilitation is SNF, TOC consulted. -Dietitian consulted, supplement protein. 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 Benay Spice referred patient for admission and is following - Pt due for monthly sandostatin (started 2016), octreotide ordered as inpatient. - Palliative care discussions throughout admission. Will attempt rehabilitation at SNF with palliative care following.  -Oncology recommends continuing 100 mcg of Sandostatin twice daily and will increase the frequency of statin statin therapy if his diarrhea persists and they are recommending a GI consult for probable hepatic encephalopathy and GI evaluated and feels that the patient is already on the treatment for his hepatic encephalopathy and recommends titration of the lactulose and continue rifaximin  Hepatic encephalopathy related to liver tumor burden -Ammonia confirmed to bemildlyelevated at 43 today. TSH, B12 wnl.  -Continue rifaximin. Lactulose was stopped due to diarrhea which has improved after giving octreotide.  -Restart lactulose at low dose due to persistent encephalopathy and currently is getting at 10 g twice daily but will discharge with this 10 g daily and have titration to twice daily if necessary. -Medical oncology recommending GI consultation prior to discharging to SNF given his hepatic encephalopathy and Gastroenterology recommending titrating the lactulose and continue Xifaxan for now and if his diarrhea is significant stopping lactulose and discontinuing Xifaxan.  We will discharge the patient on 10 g of lactulose daily with titration for 3-4 bowel movements a day as well as Xifaxan 550 mg p.o. twice daily  Abnormal LFTs -Mild with an AST of 42 and ALT of 49 -In the setting of metastasis to the liver -Continue monitor and trend and repeat CMP in the outpatient setting  BPH and  hematuria -S/p TURP 05/13/2020 by Dr. Karsten Ro. -No current gross hematuria. -Continue with tamsulosin 0.4 mg p.o. nightly -Monitor bladder scans to r/o retention.   Folic acid deficiency:  -Currently supplementing with folic acid 1 mg p.o. daily  Chronic blood loss anemia: Mild, normocytic.  -Hemoglobin/hematocrit is stable; was 12.9/39.3 and today is slightly lower at 11.5/36.2 today -Anemia panel done and showed an iron level of 53, U IBC 117, TIBC of 170, saturation ratios of 31%, ferritin level 253, folate level 5.8, and vitamin B12 891 -Continue to monitor for signs and symptoms bleeding; currently no overt bleeding noted-repeat CBC within 1 week  Thrombocytopenia -Possibly related to hepatic tumor burden. Will plan to stop heparin if <100k. -Platelet count has slowly been dropping and went from 157 is 119 and is now 123 -Continue to monitor for signs and symptoms of bleeding; currently no overt bleeding noted -Repeat CBC within 1 week   HTN -Cortisol sufficient -Hold atenolol as discontinued prior due to hypotension, likely attributable to hypovolemia.  -Last blood pressure is 112/66  Hypophosphatemia  -patient's phosphorus level was 2.3 -Replete with PhosNak 1 packet p.o. 3 times daily with meals and bedtime -Monitor and replete as necessary -Repeat phosphorus level within 1 week   Discharge Instructions  Discharge Instructions    Call MD for:  difficulty breathing, headache or visual disturbances   Complete by: As directed    Call MD for:  extreme fatigue   Complete by: As directed    Call MD  for:  hives   Complete by: As directed    Call MD for:  persistant dizziness or light-headedness   Complete by: As directed    Call MD for:  persistant nausea and vomiting   Complete by: As directed    Call MD for:  redness, tenderness, or signs of infection (pain, swelling, redness, odor or green/yellow discharge around incision site)   Complete by: As directed    Call  MD for:  severe uncontrolled pain   Complete by: As directed    Call MD for:  temperature >100.4   Complete by: As directed    Diet - low sodium heart healthy   Complete by: As directed    2 Gram Sodium Diet   Discharge instructions   Complete by: As directed    You were cared for by a hospitalist during your hospital stay. If you have any questions about your discharge medications or the care you received while you were in the hospital after you are discharged, you can call the unit and ask to speak with the hospitalist on call if the hospitalist that took care of you is not available. Once you are discharged, your primary care physician will handle any further medical issues. Please note that NO REFILLS for any discharge medications will be authorized once you are discharged, as it is imperative that you return to your primary care physician (or establish a relationship with a primary care physician if you do not have one) for your aftercare needs so that they can reassess your need for medications and monitor your lab values.  Follow up with PCP, Medical Oncology, Gastroenterology and Palliative Care. Take all medications as prescribed. If symptoms change or worsen please return to the ED for evaluation   Increase activity slowly   Complete by: As directed      Allergies as of 07/18/2020      Reactions   Vasotec [enalapril] Cough      Medication List    STOP taking these medications   atenolol 25 MG tablet Commonly known as: Tenormin     TAKE these medications   feeding supplement (ENSURE ENLIVE) Liqd Take 237 mLs by mouth 2 (two) times daily between meals.   folic acid 1 MG tablet Commonly known as: FOLVITE Take 1 tablet (1 mg total) by mouth daily.   lactulose (encephalopathy) 10 GM/15ML Soln Commonly known as: CHRONULAC Take 15 mLs (10 g total) by mouth daily. May take additional dose each day if no bowel movement. Titrate to have 3-4 Bowel Movements Daily What changed:  additional instructions   multivitamin with minerals Tabs tablet Take 1 tablet by mouth daily. Start taking on: July 19, 2020   octreotide 50 MCG/ML Soln injection Commonly known as: SANDOSTATIN Inject 2 mLs (100 mcg total) into the skin 2 (two) times daily.   ondansetron 4 MG tablet Commonly known as: ZOFRAN Take 1 tablet (4 mg total) by mouth every 6 (six) hours as needed for nausea.   potassium chloride SA 20 MEQ tablet Commonly known as: KLOR-CON TAKE 1 TABLET(20 MEQ) BY MOUTH TWICE DAILY What changed: See the new instructions.   rifaximin 550 MG Tabs tablet Commonly known as: XIFAXAN Take 1 tablet (550 mg total) by mouth 2 (two) times daily.   tamsulosin 0.4 MG Caps capsule Commonly known as: FLOMAX Take 1 capsule (0.4 mg total) by mouth at bedtime.       Contact information for follow-up providers    Elvia Collum M,  DO. Schedule an appointment as soon as possible for a visit.   Specialty: Family Medicine Contact information: [place] [address] [phone]        Ladell Pier, MD Follow up.   Specialty: Oncology Contact information: [place] [address] [facility]. Call.   Specialty: Gastroenterology Why: Call for outpatient follow up for Hepatic Encephalopathy  Contact information: [address] [place] [address] [phone]           Contact information for after-discharge care    [facility] .   Service: Skilled Nursing Contact information: [place] [phone]                 Allergies  Allergen Reactions  . Vasotec [Enalapril] Cough   Consultations:  Medical Oncology  Gastroenterology  Palliative Care Medicine  Procedures/Studies: CT Head Wo Contrast  Result Date: 06/26/2020 CLINICAL DATA:  Hypotension and lethargy EXAM: CT  HEAD WITHOUT CONTRAST TECHNIQUE: Contiguous axial images were obtained from the base of the skull through the vertex without intravenous contrast. COMPARISON:  None. FINDINGS: Brain: Diffuse atrophic changes are noted. Findings of chronic white matter ischemic change are seen. No findings to suggest acute hemorrhage, acute infarction or space-occupying mass lesion are noted. Vascular: No hyperdense vessel or unexpected calcification. Skull: Normal. Negative for fracture or focal lesion. Sinuses/Orbits: No acute finding. Other: None. IMPRESSION: Chronic atrophic and ischemic changes without acute abnormality. Electronically Signed   By: Inez Catalina M.D.   On: 06/26/2020 01:03   CT Abdomen Pelvis W Contrast  Result Date: 06/26/2020 CLINICAL DATA:  Known metastatic cecal carcinoma with abdominal distension EXAM: CT ABDOMEN AND PELVIS WITH CONTRAST TECHNIQUE: Multidetector CT imaging of the abdomen and pelvis was performed using the standard protocol following bolus administration of intravenous contrast. CONTRAST:  135mL OMNIPAQUE IOHEXOL 300 MG/ML  SOLN COMPARISON:  04/05/2020 PET-CT FINDINGS: Lower chest: No acute abnormality. Hepatobiliary: Multiple enhancing lesions are noted throughout the liver consistent with metastatic disease. Largest of these measures 16 cm in dimension. Gallbladder is within normal limits. Changes of prior embolotherapy in the gastro duodenal artery are seen. Pancreas: Unremarkable. No pancreatic ductal dilatation or surrounding inflammatory changes. Spleen: Normal in size without focal abnormality. Adrenals/Urinary Tract: Adrenal glands are within normal limits. Kidneys demonstrate a normal enhancement pattern bilaterally. Normal excretion is noted from the kidneys bilaterally. The bladder is decompressed Stomach/Bowel: Changes of prior right colonic surgery are noted. Ileocolic anastomosis is noted without obstructive change. Vascular/Lymphatic: Aortic atherosclerosis. No enlarged  abdominal or pelvic lymph nodes. Reproductive: Prostate is mildly prominent indenting upon the bladder. This is stable from prior PET-CT. Other: No abdominal wall hernia or abnormality. No abdominopelvic ascites. Musculoskeletal: Increased sclerosis is noted within the sacrum which corresponds to a metastatic lesion seen on prior PET-CT. Some scattered sclerotic lesions are noted within the spine consistent with metastatic disease. IMPRESSION: Changes consistent with diffuse hepatic metastatic disease similar to that seen on prior PET-CT. Bony metastatic disease is noted. No acute abnormality is noted correspond with the patient's given clinical history. Electronically Signed   By: Inez Catalina M.D.   On: 06/26/2020 01:58   DG Chest Port 1 View  Result Date: 06/25/2020 CLINICAL DATA:  Weakness EXAM: PORTABLE CHEST 1 VIEW COMPARISON:  09/23/2011 FINDINGS: Cardiac shadow is at the upper limits of normal in size. The lungs are  well aerated bilaterally. No focal infiltrate is seen. No bony abnormality is noted. IMPRESSION: No acute abnormality noted. Electronically Signed   By: Inez Catalina M.D.   On: 06/25/2020 22:00   DG Abd Portable 1V  Result Date: 07/12/2020 CLINICAL DATA:  Abdominal pain and hematuria. History of prostate surgery. EXAM: PORTABLE ABDOMEN - 1 VIEW COMPARISON:  Pelvic CT 06/26/2020. FINDINGS: The bowel gas pattern is nonobstructive. There is no supine evidence of free intraperitoneal air. Embolization coils and bowel anastomosis clips are present within the mid abdomen. Stable small right pelvic calcifications consistent with phleboliths. No acute osseous findings. IMPRESSION: No acute abdominal findings. Electronically Signed   By: Richardean Sale M.D.   On: 07/12/2020 18:39     Subjective: Seen and examined at bedside he still having some mild confusion and some slight diarrhea.  No chest pain, lightheadedness or dizziness.  No nausea or vomiting.  Denies any other concerns or because of  this time.  Medical oncology and gastroenterology has signed off the case and patient is stable to be discharged to skilled nursing facility where needed closer monitoring and continued rehabilitation with physical therapy.  He will need to follow-up with medical oncology, palliative care is to follow at the SNF and he will also need to follow with gastroenterology if his diarrhea persists.  Currently he is medically stable to be discharged at this time.  Discharge Exam: Vitals:   07/17/20 2111 07/18/20 0416  BP: 121/81 112/66  Pulse: 86 80  Resp: 20 20  Temp: 98 F (36.7 C) 97.7 F (36.5 C)  SpO2: 100% 100%   Vitals:   07/17/20 1312 07/17/20 2111 07/18/20 0416 07/18/20 0500  BP: 115/79 121/81 112/66   Pulse: 90 86 80   Resp: 16 20 20    Temp: 97.6 F (36.4 C) 98 F (36.7 C) 97.7 F (36.5 C)   TempSrc: Oral Oral Oral   SpO2: 100% 100% 100%   Weight:    80.5 kg  Height:       General: Pt is awake, not in acute distress Cardiovascular: RRR, S1/S2 +, no rubs, no gallops Respiratory: Diminished bilaterally, no wheezing, no rhonchi; unlabored breathing and not wearing supplemental oxygen via nasal cannula Abdominal: Soft, NT, ND, bowel sounds + Extremities: Mild edema, no cyanosis  The results of significant diagnostics from this hospitalization (including imaging, microbiology, ancillary and laboratory) are listed below for reference.    Microbiology: Recent Results (from the past 240 hour(s))  SARS Coronavirus 2 by RT PCR (hospital order, performed in [facility] hospital lab) Nasopharyngeal Nasopharyngeal Swab     Status: None   Collection Time: 07/18/20 11:06 AM   Specimen: Nasopharyngeal Swab  Result Value Ref Range Status   SARS Coronavirus 2 NEGATIVE NEGATIVE Final    Comment: (NOTE) SARS-CoV-2 target nucleic acids are NOT DETECTED.  The SARS-CoV-2 RNA is generally detectable in upper and lower respiratory specimens during the acute phase of infection. The  lowest concentration of SARS-CoV-2 viral copies this assay can detect is 250 copies / mL. A negative result does not preclude SARS-CoV-2 infection and should not be used as the sole basis for treatment or other patient management decisions.  A negative result may occur with improper specimen collection / handling, submission of specimen other than nasopharyngeal swab, presence of viral mutation(s) within the areas targeted by this assay, and inadequate number of viral copies (<250 copies / mL). A negative result must be combined with clinical observations, patient history, and epidemiological information.  Fact Sheet for Patients:   StrictlyIdeas.no  Fact Sheet for Healthcare Providers: BankingDealers.co.za  This test is not yet approved or  cleared by the [place] FDA and has been authorized for detection and/or diagnosis of SARS-CoV-2 by FDA under an Emergency Use Authorization (EUA).  This EUA will remain in effect (meaning this test can be used) for the duration of the COVID-19 declaration under Section 564(b)(1) of the Act, 21 U.S.C. section 360bbb-3(b)(1), unless the authorization is terminated or revoked sooner.  Performed at [facility], [place] [address], [place], [place] [address]     Labs: BNP (last 3 results) No results for input(s): BNP in the last 8760 hours. Basic Metabolic Panel: Recent Labs  Lab 07/13/20 0847 07/14/20 1602 07/15/20 0542 07/17/20 0613 07/18/20 1146  NA 134* 134* 133* 136 136  K 3.4* 4.2 3.6 4.5 4.4  CL 107 109 109 111 112*  CO2 20* 16* 17* 18* 19*  GLUCOSE 81 102* 92 109* 143*  BUN 31* 25* 24* 19 18  CREATININE 1.35* 0.93 0.87 0.86 0.77  CALCIUM 8.5* 8.5* 8.6* 8.5* 8.5*  MG  --   --  2.4 1.9 1.9  PHOS  --   --  1.9* 2.1* 2.3*   Liver Function Tests: Recent Labs  Lab 07/11/20 1334 07/12/20 2232 07/13/20 0847 07/18/20 1146  AST 39 38 40 42*  ALT 51* 44 45* 49*   ALKPHOS 280* 237* 234* 224*  BILITOT 1.2 1.0 1.1 0.8  PROT 6.4* 5.9* 5.3* 4.9*  ALBUMIN 3.2* 2.8* 2.6* 2.2*   No results for input(s): LIPASE, AMYLASE in the last 168 hours. Recent Labs  Lab 07/12/20 2232 07/18/20 1146  AMMONIA 40* 43*   CBC: Recent Labs  Lab 07/11/20 1334 07/11/20 1334 07/12/20 2232 07/13/20 0847 07/14/20 1602 07/15/20 0542 07/18/20 1146  WBC 7.3  --  6.8 6.3 7.9 6.8 5.4  NEUTROABS 4.6  --   --  4.0 5.3 4.3 3.5  HGB 13.4  --  13.1 12.5* 12.4* 12.9* 11.5*  HCT 39.9   < > 40.0 38.8* 39.4 39.3 36.2*  MCV 88.1   < > 91.5 91.5 93.6 89.7 91.4  PLT 186  --  157 154 123* 119* 123*   < > = values in this interval not displayed.   Cardiac Enzymes: No results for input(s): CKTOTAL, CKMB, CKMBINDEX, TROPONINI in the last 168 hours. BNP: Invalid input(s): POCBNP CBG: Recent Labs  Lab 07/13/20 0034 07/13/20 0119  GLUCAP 66* 80   D-Dimer No results for input(s): DDIMER in the last 72 hours. Hgb A1c No results for input(s): HGBA1C in the last 72 hours. Lipid Profile No results for input(s): CHOL, HDL, LDLCALC, TRIG, CHOLHDL, LDLDIRECT in the last 72 hours. Thyroid function studies No results for input(s): TSH, T4TOTAL, T3FREE, THYROIDAB in the last 72 hours.  Invalid input(s): FREET3 Anemia work up No results for input(s): VITAMINB12, FOLATE, FERRITIN, TIBC, IRON, RETICCTPCT in the last 72 hours. Urinalysis    Component Value Date/Time   COLORURINE YELLOW 07/13/2020 2215   APPEARANCEUR CLEAR 07/13/2020 2215   LABSPEC 1.016 07/13/2020 2215   PHURINE 6.0 07/13/2020 2215   GLUCOSEU NEGATIVE 07/13/2020 2215   HGBUR LARGE (A) 07/13/2020 2215   BILIRUBINUR NEGATIVE 07/13/2020 2215   BILIRUBINUR +++ 02/27/2020 1359   KETONESUR NEGATIVE 07/13/2020 2215   PROTEINUR 100 (A) 07/13/2020 2215   UROBILINOGEN 0.2 09/23/2011 1111   NITRITE NEGATIVE 07/13/2020 2215   LEUKOCYTESUR SMALL (A) 07/13/2020 2215   Sepsis Labs  Invalid input(s): PROCALCITONIN,  WBC,   LACTICIDVEN Microbiology Recent Results (from the past 240 hour(s))  SARS Coronavirus 2 by RT PCR (hospital order, performed in [facility] hospital lab) Nasopharyngeal Nasopharyngeal Swab     Status: None   Collection Time: 07/18/20 11:06 AM   Specimen: Nasopharyngeal Swab  Result Value Ref Range Status   SARS Coronavirus 2 NEGATIVE NEGATIVE Final    Comment: (NOTE) SARS-CoV-2 target nucleic acids are NOT DETECTED.  The SARS-CoV-2 RNA is generally detectable in upper and lower respiratory specimens during the acute phase of infection. The lowest concentration of SARS-CoV-2 viral copies this assay can detect is 250 copies / mL. A negative result does not preclude SARS-CoV-2 infection and should not be used as the sole basis for treatment or other patient management decisions.  A negative result may occur with improper specimen collection / handling, submission of specimen other than nasopharyngeal swab, presence of viral mutation(s) within the areas targeted by this assay, and inadequate number of viral copies (<250 copies / mL). A negative result must be combined with clinical observations, patient history, and epidemiological information.  Fact Sheet for Patients:   StrictlyIdeas.no  Fact Sheet for Healthcare Providers: BankingDealers.co.za  This test is not yet approved or  cleared by the [place] FDA and has been authorized for detection and/or diagnosis of SARS-CoV-2 by FDA under an Emergency Use Authorization (EUA).  This EUA will remain in effect (meaning this test can be used) for the duration of the COVID-19 declaration under Section 564(b)(1) of the Act, 21 U.S.C. section 360bbb-3(b)(1), unless the authorization is terminated or revoked sooner.  Performed at [facility], [place] [address], [place], [place] [address]    Time coordinating discharge: 35 minutes  SIGNED:  Kerney Elbe,  DO [facility] 07/18/2020, 12:24 PM Pager is on [place]  If 7PM-7AM, please contact night-coverage [email or web address]

## 2020-07-19 ENCOUNTER — Ambulatory Visit: Payer: Medicare Other | Admitting: Family Medicine

## 2020-07-22 ENCOUNTER — Other Ambulatory Visit: Payer: Self-pay

## 2020-07-22 ENCOUNTER — Inpatient Hospital Stay: Payer: No Typology Code available for payment source | Attending: Nurse Practitioner

## 2020-07-22 VITALS — BP 116/72 | HR 100 | Resp 18

## 2020-07-22 DIAGNOSIS — Z79899 Other long term (current) drug therapy: Secondary | ICD-10-CM | POA: Insufficient documentation

## 2020-07-22 DIAGNOSIS — C7A021 Malignant carcinoid tumor of the cecum: Secondary | ICD-10-CM | POA: Insufficient documentation

## 2020-07-22 DIAGNOSIS — C7B02 Secondary carcinoid tumors of liver: Secondary | ICD-10-CM | POA: Diagnosis not present

## 2020-07-22 MED ORDER — OCTREOTIDE ACETATE 30 MG IM KIT
PACK | INTRAMUSCULAR | Status: AC
  Filled 2020-07-22: qty 1

## 2020-07-22 MED ORDER — OCTREOTIDE ACETATE 30 MG IM KIT
30.0000 mg | PACK | Freq: Once | INTRAMUSCULAR | Status: AC
  Administered 2020-07-22: 30 mg via INTRAMUSCULAR

## 2020-07-22 NOTE — Patient Instructions (Signed)
Octreotide injection solution What is this medicine? OCTREOTIDE (ok TREE oh tide) is used to reduce blood levels of growth hormone in patients with a condition called acromegaly. This medicine also reduces flushing and watery diarrhea caused by certain types of cancer. This medicine may be used for other purposes; ask your health care provider or pharmacist if you have questions. COMMON BRAND NAME(S): Bynfezia, Sandostatin What should I tell my health care provider before I take this medicine? They need to know if you have any of these conditions:  diabetes  gallbladder disease  kidney disease  liver disease  thyroid disease  an unusual or allergic reaction to octreotide, other medicines, foods, dyes, or preservatives  pregnant or trying to get pregnant  breast-feeding How should I use this medicine? This medicine is for injection under the skin or into a vein (only in emergency situations). It is usually given by a health care professional in a hospital or clinic setting. If you get this medicine at home, you will be taught how to prepare and give this medicine. Allow the injection solution to come to room temperature before use. Do not warm it artificially. Use exactly as directed. Take your medicine at regular intervals. Do not take your medicine more often than directed. It is important that you put your used needles and syringes in a special sharps container. Do not put them in a trash can. If you do not have a sharps container, call your pharmacist or healthcare provider to get one. Talk to your pediatrician regarding the use of this medicine in children. Special care may be needed. Overdosage: If you think you have taken too much of this medicine contact a poison control center or emergency room at once. NOTE: This medicine is only for you. Do not share this medicine with others. What if I miss a dose? If you miss a dose, take it as soon as you can. If it is almost time for your  next dose, take only that dose. Do not take double or extra doses. What may interact with this medicine?  bromocriptine  certain medicines for blood pressure, heart disease, irregular heartbeat  cyclosporine  diuretics  medicines for diabetes, including insulin  quinidine This list may not describe all possible interactions. Give your health care provider a list of all the medicines, herbs, non-prescription drugs, or dietary supplements you use. Also tell them if you smoke, drink alcohol, or use illegal drugs. Some items may interact with your medicine. What should I watch for while using this medicine? Visit your doctor or health care professional for regular checks on your progress. To help reduce irritation at the injection site, use a different site for each injection and make sure the solution is at room temperature before use. This medicine may cause decreases in blood sugar. Signs of low blood sugar include chills, cool, pale skin or cold sweats, drowsiness, extreme hunger, fast heartbeat, headache, nausea, nervousness or anxiety, shakiness, trembling, unsteadiness, tiredness, or weakness. Contact your doctor or health care professional right away if you experience any of these symptoms. This medicine may increase blood sugar. Ask your healthcare provider if changes in diet or medicines are needed if you have diabetes. This medicine may cause a decrease in vitamin B12. You should make sure that you get enough vitamin B12 while you are taking this medicine. Discuss the foods you eat and the vitamins you take with your health care professional. What side effects may I notice from receiving this medicine? Side   effects that you should report to your doctor or health care professional as soon as possible:  allergic reactions like skin rash, itching or hives, swelling of the face, lips, or tongue  fast, slow, or irregular heartbeat  right upper belly pain  severe stomach pain  signs  and symptoms of high blood sugar such as being more thirsty or hungry or having to urinate more than normal. You may also feel very tired or have blurry vision.  signs and symptoms of low blood sugar such as feeling anxious; confusion; dizziness; increased hunger; unusually weak or tired; increased sweating; shakiness; cold, clammy skin; irritable; headache; blurred vision; fast heartbeat; loss of consciousness  unusually weak or tired Side effects that usually do not require medical attention (report to your doctor or health care professional if they continue or are bothersome):  diarrhea  dizziness  gas  headache  nausea, vomiting  pain, redness, or irritation at site where injected  upset stomach This list may not describe all possible side effects. Call your doctor for medical advice about side effects. You may report side effects to FDA at 1-800-FDA-1088. Where should I keep my medicine? Keep out of the reach of children. Store in a refrigerator between 2 and 8 degrees C (36 and 46 degrees F). Protect from light. Allow to come to room temperature naturally. Do not use artificial heat. If protected from light, the injection may be stored at room temperature between 20 and 30 degrees C (70 and 86 degrees F) for 14 days. After the initial use, throw away any unused portion of a multiple dose vial after 14 days. Throw away unused portions of the ampules after use. NOTE: This sheet is a summary. It may not cover all possible information. If you have questions about this medicine, talk to your doctor, pharmacist, or health care provider.  2020 Elsevier/Gold Standard (2019-07-06 13:33:09)  

## 2020-07-25 ENCOUNTER — Telehealth: Payer: Self-pay | Admitting: *Deleted

## 2020-07-25 NOTE — Telephone Encounter (Signed)
Left VM to alert facility of appointment at [facility] for 8/17 at 2:45.

## 2020-07-30 ENCOUNTER — Telehealth: Payer: Self-pay | Admitting: Family Medicine

## 2020-07-30 NOTE — Telephone Encounter (Signed)
Cassandra with [facility] calling to see if we will be the attending provider for this patient.  Patient is at [facility] but will be going home Friday.   Cassandra [phone]  Ext [phone]

## 2020-08-01 ENCOUNTER — Other Ambulatory Visit: Payer: Self-pay | Admitting: Family Medicine

## 2020-08-01 NOTE — Telephone Encounter (Signed)
Cassandra at Hospice stated she could see if the family agrees to have their director take over care- normally care stays with PCP but given special circumstances will see if family agrees

## 2020-08-01 NOTE — Telephone Encounter (Signed)
Hi,   If you could call the hospice center to see what the options are for having the oncologist or medical director take over the care? Since I haven't seen the patient.   Thanks,   Dr. Lovena Le

## 2020-08-05 ENCOUNTER — Telehealth: Payer: Self-pay | Admitting: Nurse Practitioner

## 2020-08-05 NOTE — Telephone Encounter (Signed)
Called pt per 8/16 sch message - no answer . Left message for patine tot call back and reschedule appt.

## 2020-08-06 ENCOUNTER — Inpatient Hospital Stay: Payer: No Typology Code available for payment source | Admitting: Nurse Practitioner

## 2020-08-21 DEATH — deceased

## 2020-09-13 IMAGING — CT CT HEAD W/O CM
3 series · 15 of 47 positions shown, 18 images · non-contrast
Comparison: None.

CLINICAL DATA: Hypotension and lethargy

EXAM:
CT HEAD WITHOUT CONTRAST
TECHNIQUE: Contiguous axial images were obtained from the base of the skull
through the vertex without intravenous contrast.

[Series 3: head w o · axial · 0.52mm/px · z∈[+37,+172]mm · 9 of 33 slices shown, 12 images]
[im 3/33  brain]
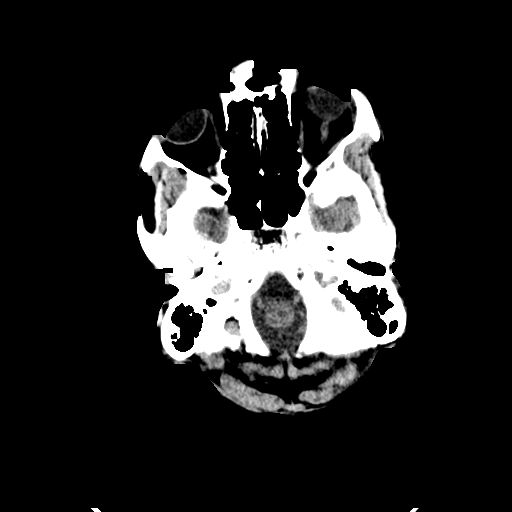
[im 3/33  bone]
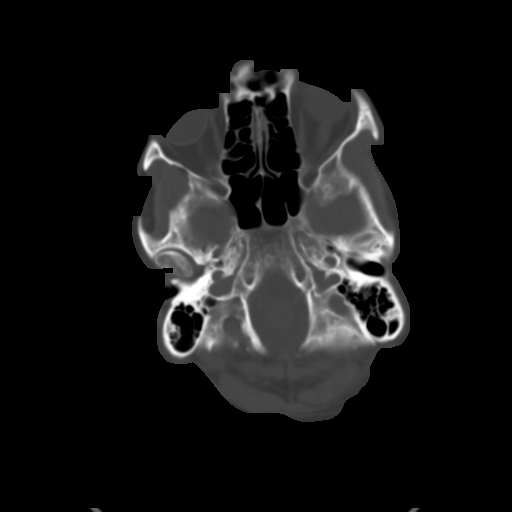
[im 6/33  brain]
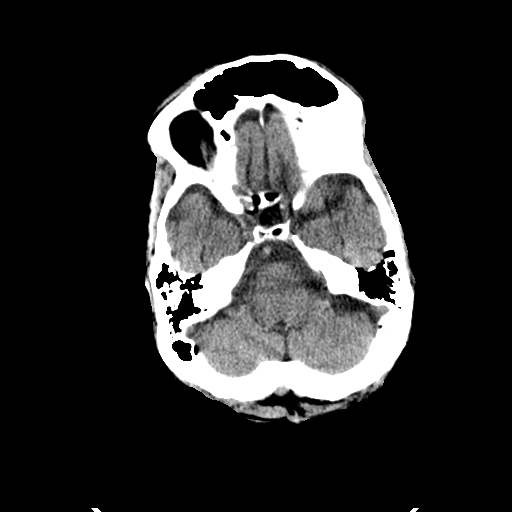
[im 9/33  brain]
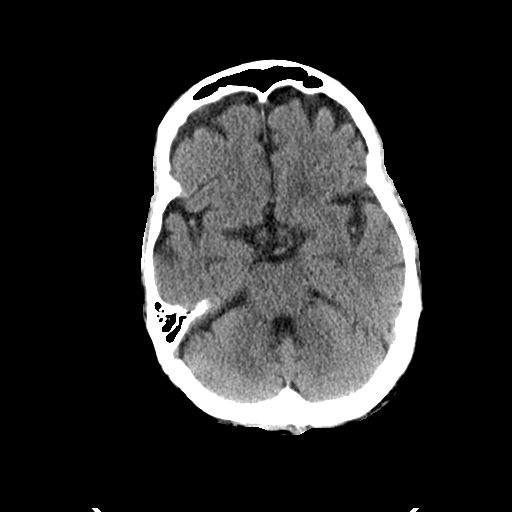
[im 13/33  brain]
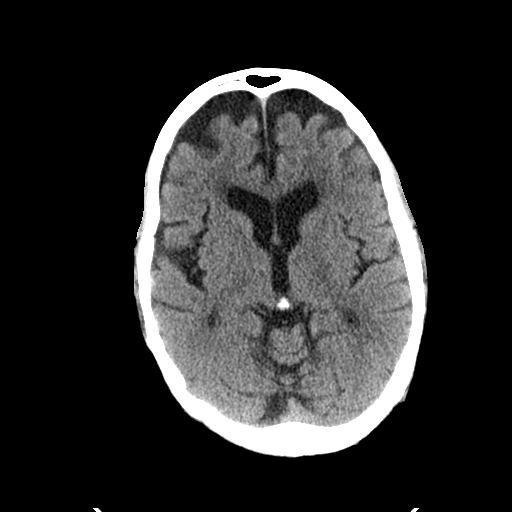
[im 17/33  brain]
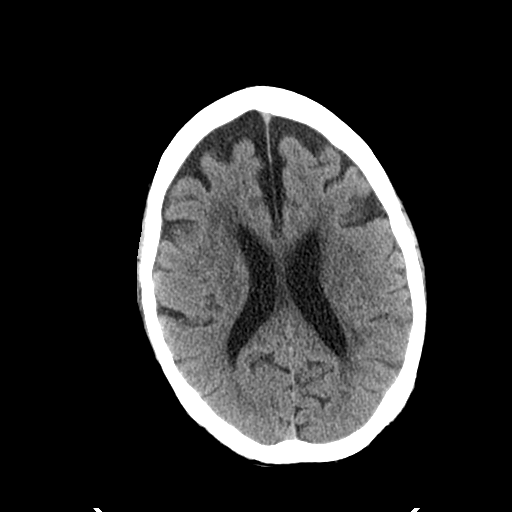
[im 17/33  bone]
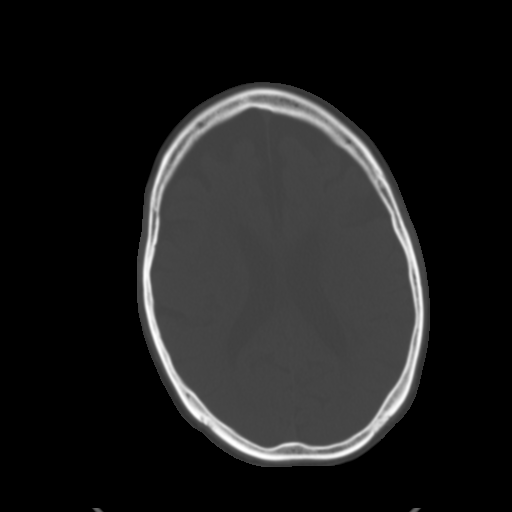
[im 20/33  brain]
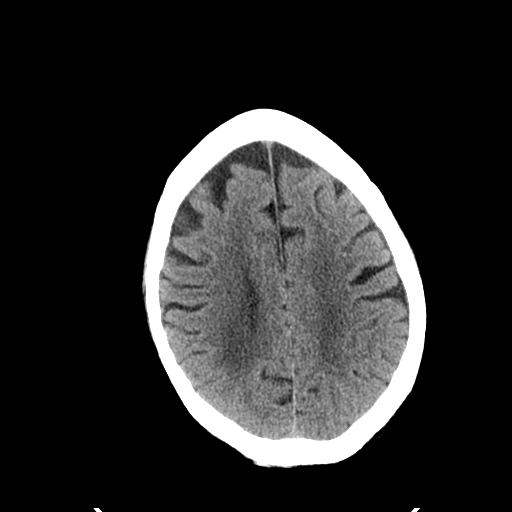
[im 24/33  brain]
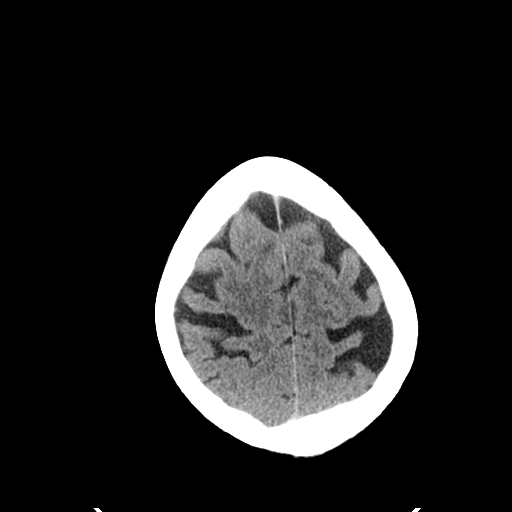
[im 27/33  brain]
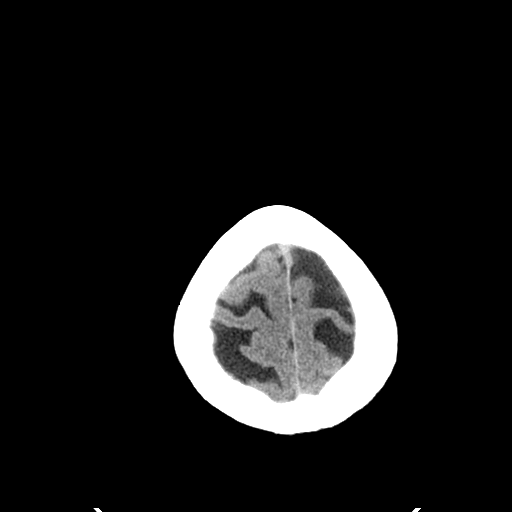
[im 30/33  brain]
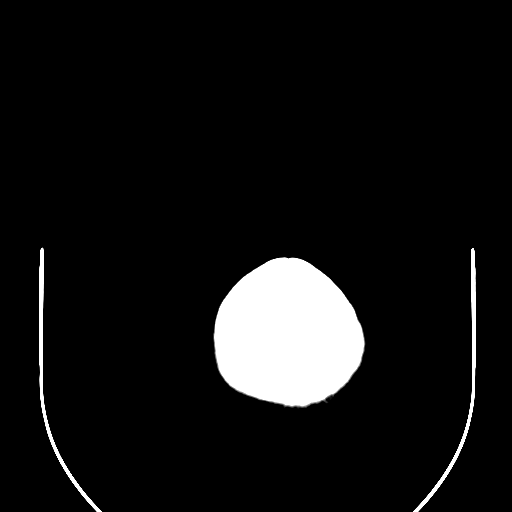
[im 30/33  bone]
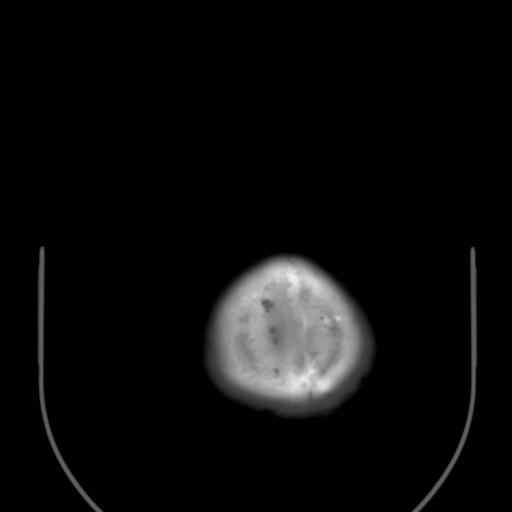

[Series 5: coronal soft · coronal · 0.32mm/px · 3 of 76 slices shown]
[im 26/76  brain]
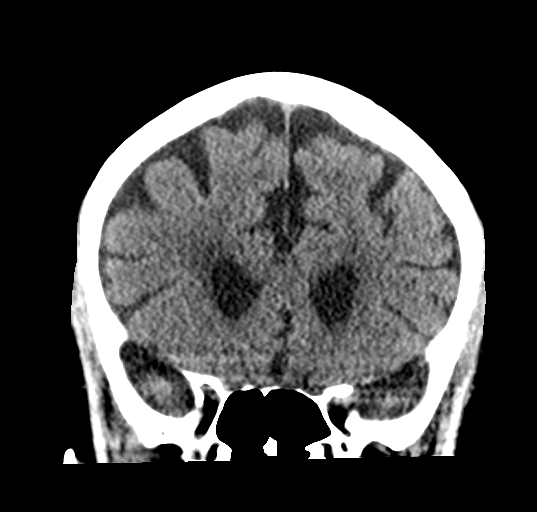
[im 34/76  brain]
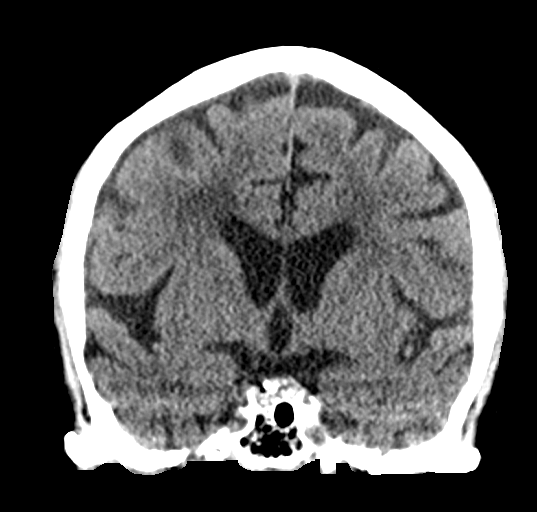
[im 42/76  brain]
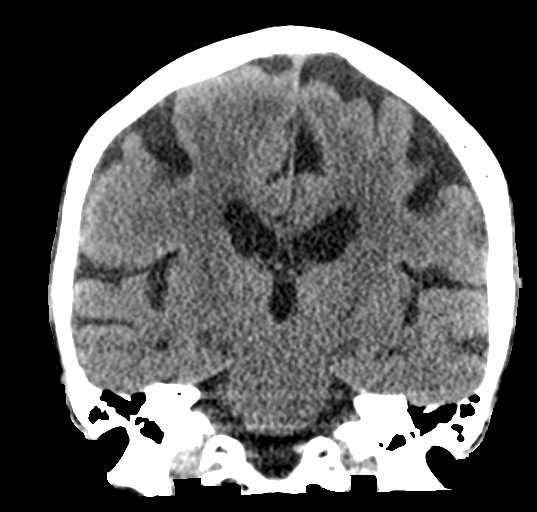

[Series 6: sagittal soft · sagittal · 0.31mm/px · 3 of 54 slices shown]
[im 18/54  brain]
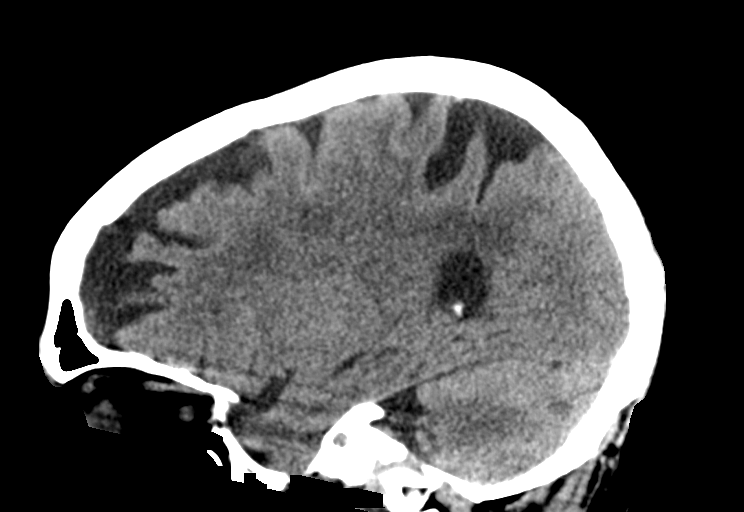
[im 27/54  brain]
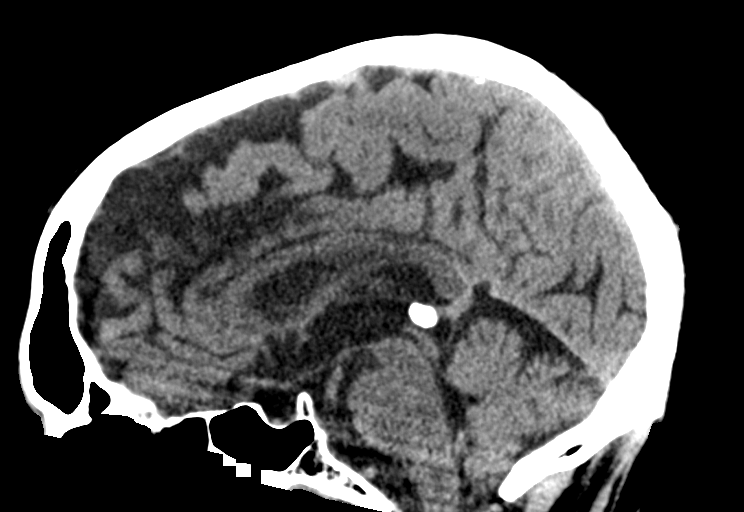
[im 36/54  brain]
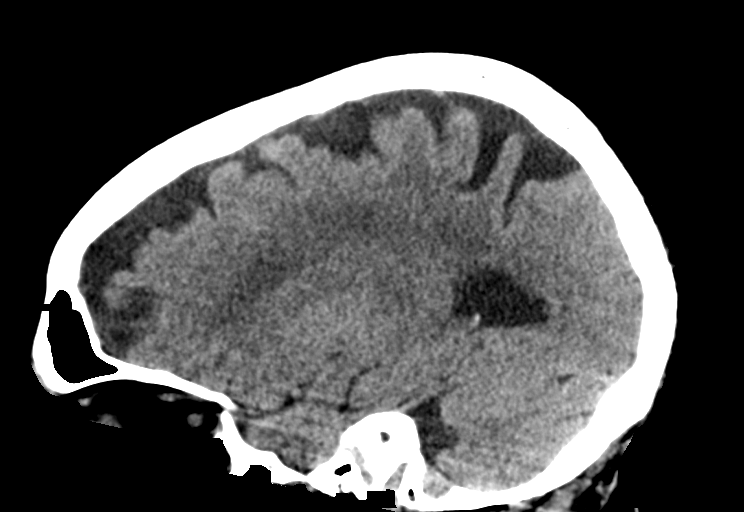

[15 of 47 positions shown; findings below may reference images not displayed]

FINDINGS: Brain: Diffuse atrophic changes are noted. Findings of chronic white
matter ischemic change are seen. No findings to suggest acute
hemorrhage, acute infarction or space-occupying mass lesion are
noted.

Vascular: No hyperdense vessel or unexpected calcification.

Skull: Normal. Negative for fracture or focal lesion.

Sinuses/Orbits: No acute finding.

Other: None.
IMPRESSION: Chronic atrophic and ischemic changes without acute abnormality.

## 2020-09-13 IMAGING — CT CT ABD-PELV W/ CM
2 of 5 series · 16 of 46 positions shown, 18 images · IV contrast (Omnipaque or Isovue)
Comparison: 04/05/2020 PET-CT

CLINICAL DATA: Known metastatic cecal carcinoma with abdominal
distension

EXAM:
CT ABDOMEN AND PELVIS WITH CONTRAST
TECHNIQUE: Multidetector CT imaging of the abdomen and pelvis was performed
using the standard protocol following bolus administration of
intravenous contrast.
CONTRAST:  100mL OMNIPAQUE IOHEXOL 300 MG/ML  SOLN

[Series 3: axial st · axial · 0.69mm/px · z∈[+1016,+1446]mm · 13 of 98 slices shown, 15 images]
[im 6/98  soft-tissue]
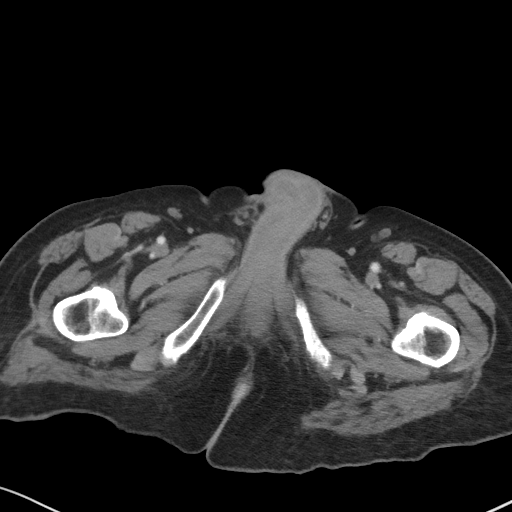
[im 6/98  bone]
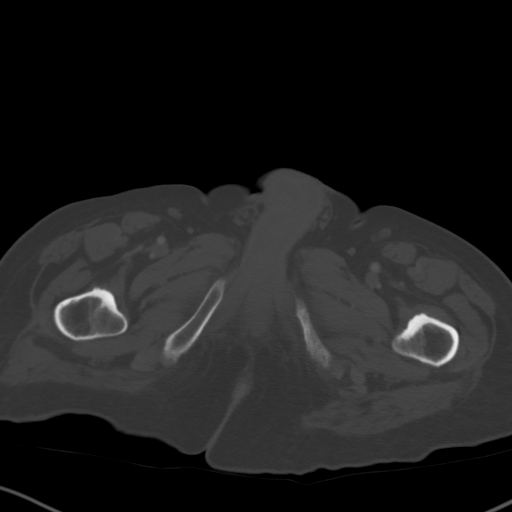
[im 12/98  soft-tissue]
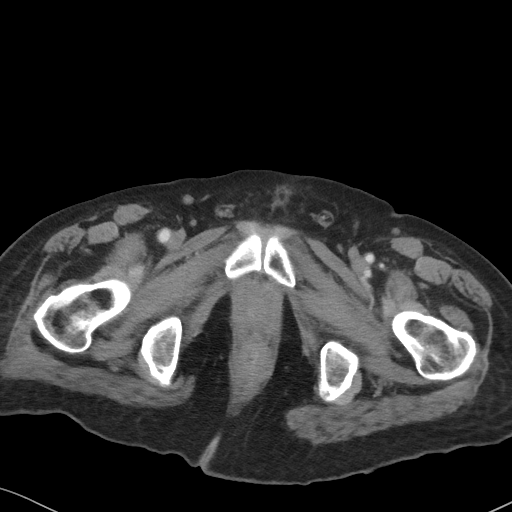
[im 23/98  soft-tissue]
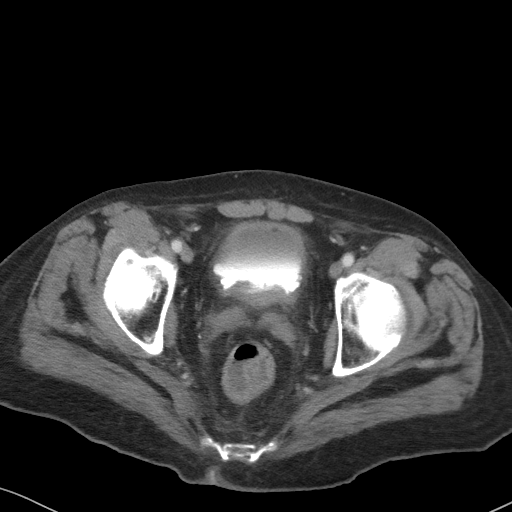
[im 29/98  soft-tissue]
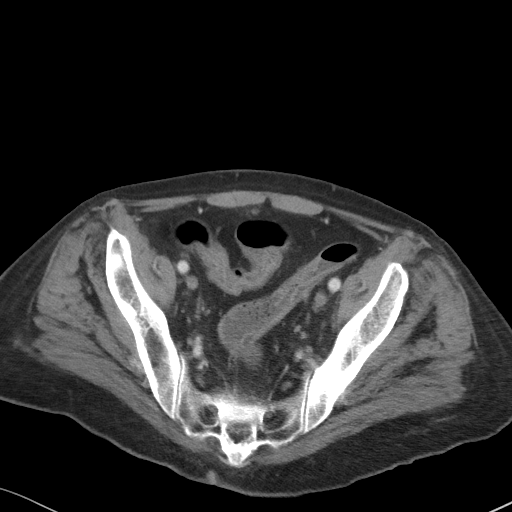
[im 35/98  soft-tissue]
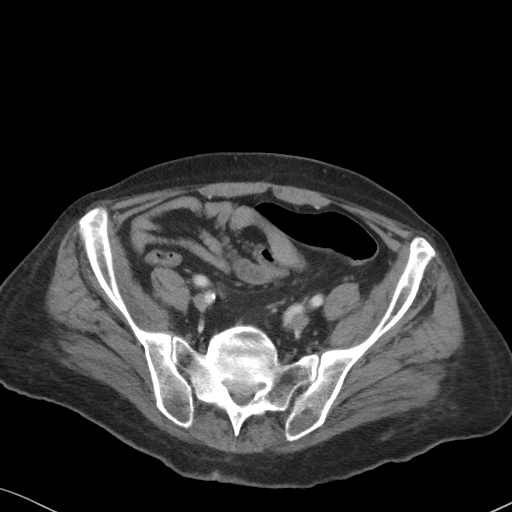
[im 40/98  soft-tissue]
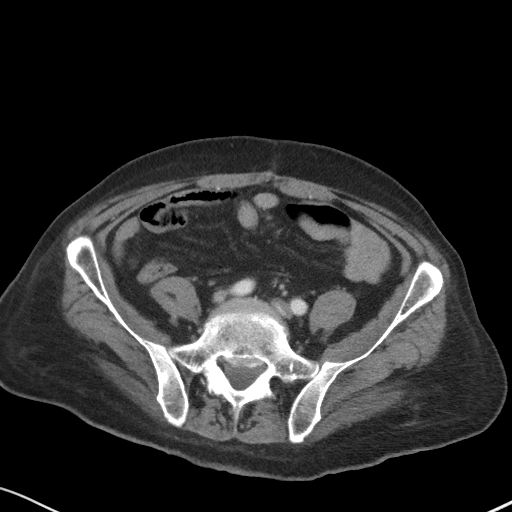
[im 52/98  soft-tissue]
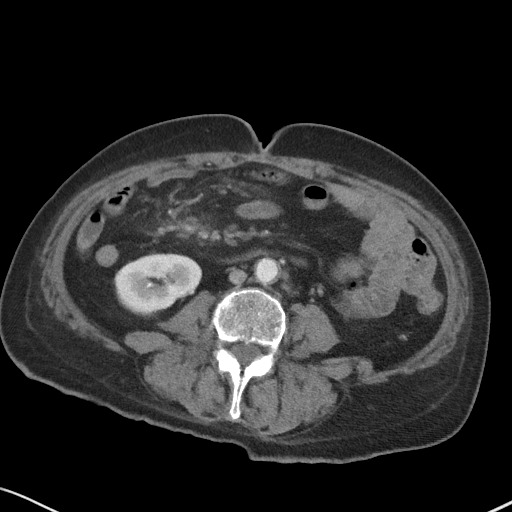
[im 58/98  soft-tissue]
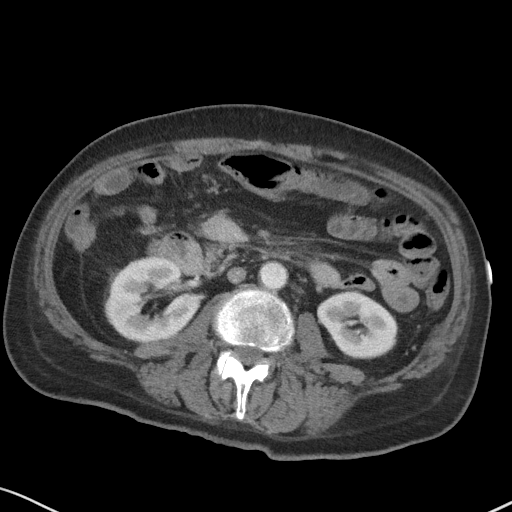
[im 63/98  soft-tissue]
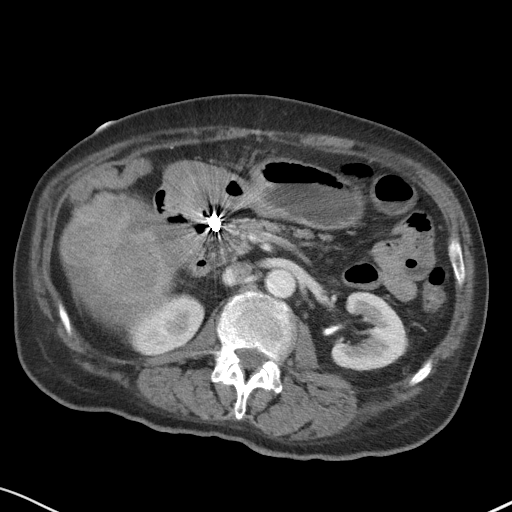
[im 63/98  bone]
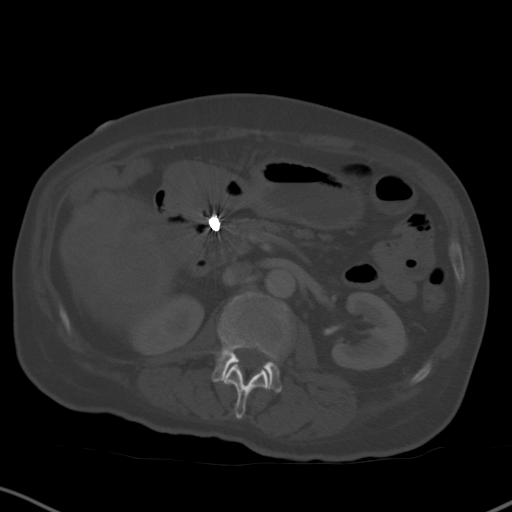
[im 69/98  soft-tissue]
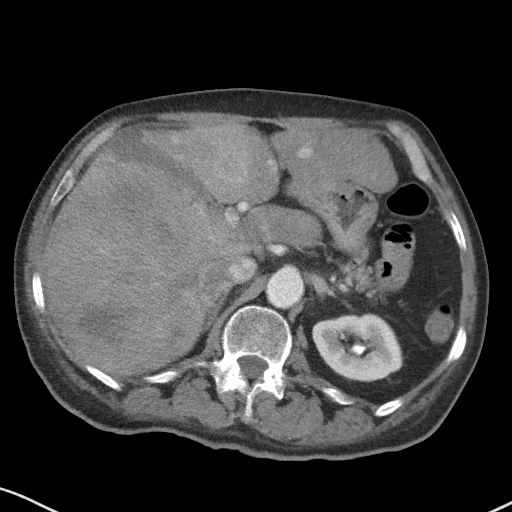
[im 75/98  soft-tissue]
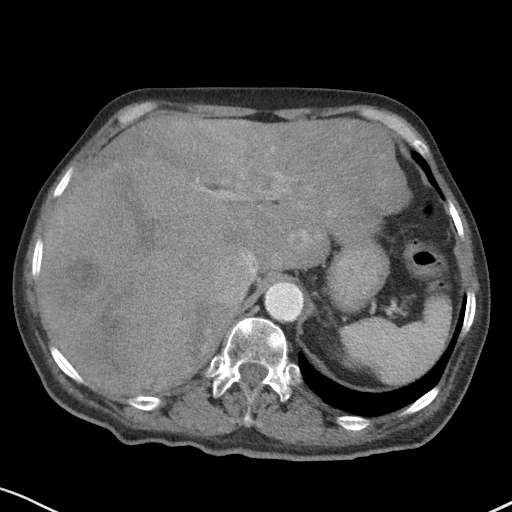
[im 86/98  soft-tissue]
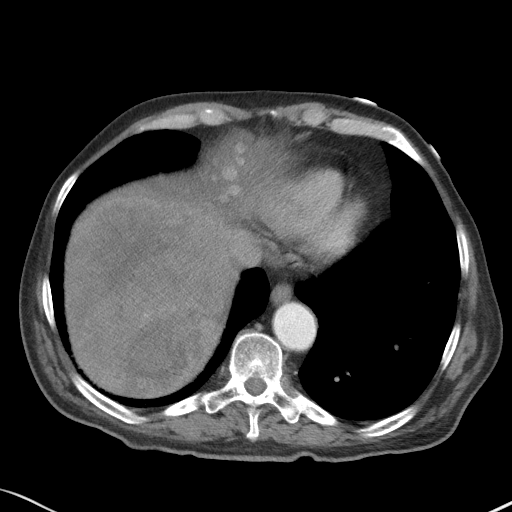
[im 92/98  soft-tissue]
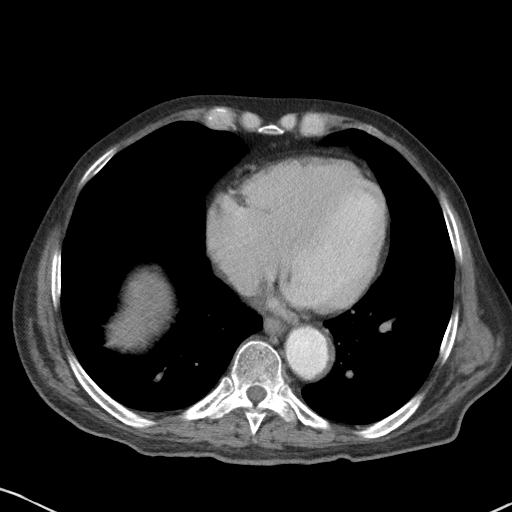

[Series 6: coronal st · coronal · 0.85mm/px · 3 of 83 slices shown]
[im 28/83  soft-tissue]
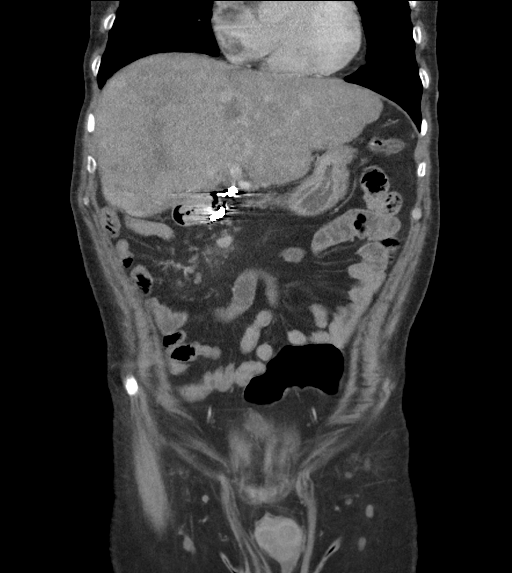
[im 37/83  soft-tissue]
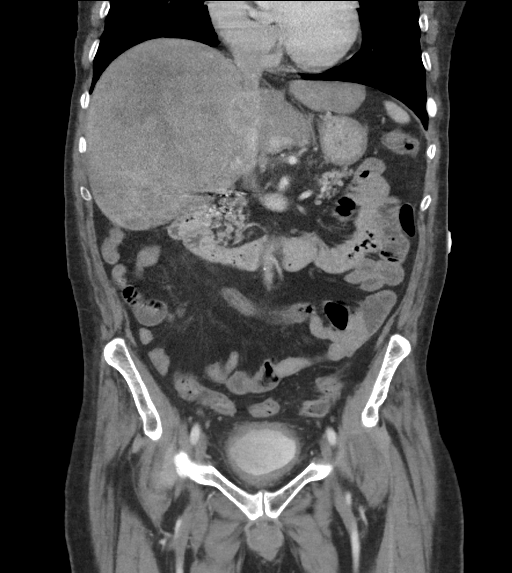
[im 46/83  soft-tissue]
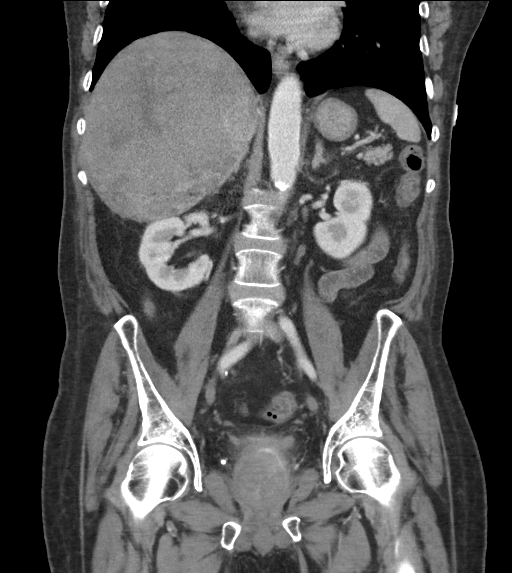

[16 of 46 positions shown; findings below may reference images not displayed]

FINDINGS: Lower chest: No acute abnormality.

Hepatobiliary: Multiple enhancing lesions are noted throughout the
liver consistent with metastatic disease. Largest of these measures
16 cm in dimension. Gallbladder is within normal limits. Changes of
prior embolotherapy in the gastro duodenal artery are seen.

Pancreas: Unremarkable. No pancreatic ductal dilatation or
surrounding inflammatory changes.

Spleen: Normal in size without focal abnormality.

Adrenals/Urinary Tract: Adrenal glands are within normal limits.
Kidneys demonstrate a normal enhancement pattern bilaterally. Normal
excretion is noted from the kidneys bilaterally. The bladder is
decompressed

Stomach/Bowel: Changes of prior right colonic surgery are noted.
Ileocolic anastomosis is noted without obstructive change.

Vascular/Lymphatic: Aortic atherosclerosis. No enlarged abdominal or
pelvic lymph nodes.

Reproductive: Prostate is mildly prominent indenting upon the
bladder. This is stable from prior PET-CT.

Other: No abdominal wall hernia or abnormality. No abdominopelvic
ascites.

Musculoskeletal: Increased sclerosis is noted within the sacrum
which corresponds to a metastatic lesion seen on prior PET-CT. Some
scattered sclerotic lesions are noted within the spine consistent
with metastatic disease.
IMPRESSION: Changes consistent with diffuse hepatic metastatic disease similar
to that seen on prior PET-CT.

Bony metastatic disease is noted.

No acute abnormality is noted correspond with the patient's given
clinical history.

## 2020-09-24 ENCOUNTER — Ambulatory Visit: Payer: Medicare Other | Admitting: Gastroenterology
